# Patient Record
Sex: Male | Born: 1965
Health system: Southern US, Community
[De-identification: ages and names within clinical notes are randomized; demographics above are authoritative.]

## PROBLEM LIST (undated history)

## (undated) DIAGNOSIS — Q231 Congenital insufficiency of aortic valve: Secondary | ICD-10-CM

## (undated) DIAGNOSIS — Z794 Long term (current) use of insulin: Secondary | ICD-10-CM

## (undated) DIAGNOSIS — J449 Chronic obstructive pulmonary disease, unspecified: Secondary | ICD-10-CM

## (undated) DIAGNOSIS — I509 Heart failure, unspecified: Secondary | ICD-10-CM

## (undated) DIAGNOSIS — I499 Cardiac arrhythmia, unspecified: Secondary | ICD-10-CM

## (undated) DIAGNOSIS — G4730 Sleep apnea, unspecified: Secondary | ICD-10-CM

## (undated) DIAGNOSIS — I2510 Atherosclerotic heart disease of native coronary artery without angina pectoris: Secondary | ICD-10-CM

## (undated) HISTORY — PX: CARDIAC CATHETERIZATION: SHX172

## (undated) HISTORY — DX: Type 2 diabetes mellitus without complications: Z79.4

## (undated) HISTORY — DX: Congenital insufficiency of aortic valve: Q23.1

## (undated) HISTORY — DX: Chronic obstructive pulmonary disease, unspecified: J44.9

## (undated) HISTORY — DX: Bicuspid aortic valve: Q23.81

## (undated) HISTORY — DX: Type 2 diabetes mellitus without complications: E11.9

---

## 2007-05-31 ENCOUNTER — Ambulatory Visit: Payer: Self-pay | Admitting: Gastroenterology

## 2007-06-30 ENCOUNTER — Ambulatory Visit: Payer: Self-pay | Admitting: Gastroenterology

## 2007-07-20 ENCOUNTER — Ambulatory Visit (HOSPITAL_COMMUNITY): Admission: RE | Admit: 2007-07-20 | Discharge: 2007-07-20 | Payer: Self-pay | Admitting: Gastroenterology

## 2007-07-27 ENCOUNTER — Ambulatory Visit: Payer: Self-pay | Admitting: Gastroenterology

## 2007-08-25 ENCOUNTER — Ambulatory Visit: Payer: Self-pay | Admitting: Gastroenterology

## 2007-10-05 ENCOUNTER — Ambulatory Visit: Payer: Self-pay | Admitting: Gastroenterology

## 2007-11-03 ENCOUNTER — Ambulatory Visit: Payer: Self-pay | Admitting: Gastroenterology

## 2007-12-06 ENCOUNTER — Ambulatory Visit: Payer: Self-pay | Admitting: Gastroenterology

## 2008-01-10 ENCOUNTER — Ambulatory Visit: Payer: Self-pay | Admitting: Gastroenterology

## 2008-01-26 ENCOUNTER — Ambulatory Visit: Payer: Self-pay | Admitting: Gastroenterology

## 2008-01-31 ENCOUNTER — Ambulatory Visit (HOSPITAL_COMMUNITY): Admission: RE | Admit: 2008-01-31 | Discharge: 2008-01-31 | Payer: Self-pay | Admitting: Internal Medicine

## 2008-02-23 ENCOUNTER — Ambulatory Visit: Payer: Self-pay | Admitting: Gastroenterology

## 2008-05-24 ENCOUNTER — Ambulatory Visit: Payer: Self-pay | Admitting: Gastroenterology

## 2010-12-21 ENCOUNTER — Encounter: Payer: Self-pay | Admitting: Internal Medicine

## 2011-08-24 LAB — CREATININE, SERUM: Creatinine, Ser: 0.79

## 2013-08-03 DIAGNOSIS — B182 Chronic viral hepatitis C: Secondary | ICD-10-CM

## 2013-08-03 HISTORY — DX: Chronic viral hepatitis C: B18.2

## 2013-08-14 DIAGNOSIS — J449 Chronic obstructive pulmonary disease, unspecified: Secondary | ICD-10-CM | POA: Insufficient documentation

## 2015-07-03 DIAGNOSIS — E119 Type 2 diabetes mellitus without complications: Secondary | ICD-10-CM | POA: Insufficient documentation

## 2015-07-03 DIAGNOSIS — I10 Essential (primary) hypertension: Secondary | ICD-10-CM

## 2015-07-03 DIAGNOSIS — E785 Hyperlipidemia, unspecified: Secondary | ICD-10-CM | POA: Insufficient documentation

## 2015-07-03 DIAGNOSIS — Q231 Congenital insufficiency of aortic valve: Secondary | ICD-10-CM | POA: Insufficient documentation

## 2015-07-03 HISTORY — DX: Hyperlipidemia, unspecified: E78.5

## 2015-07-03 HISTORY — DX: Essential (primary) hypertension: I10

## 2016-06-03 DIAGNOSIS — R569 Unspecified convulsions: Secondary | ICD-10-CM | POA: Diagnosis not present

## 2016-06-03 DIAGNOSIS — R55 Syncope and collapse: Secondary | ICD-10-CM | POA: Diagnosis not present

## 2016-06-12 DIAGNOSIS — G4733 Obstructive sleep apnea (adult) (pediatric): Secondary | ICD-10-CM | POA: Diagnosis not present

## 2016-06-22 DIAGNOSIS — R404 Transient alteration of awareness: Secondary | ICD-10-CM | POA: Diagnosis not present

## 2016-06-22 DIAGNOSIS — G63 Polyneuropathy in diseases classified elsewhere: Secondary | ICD-10-CM | POA: Diagnosis not present

## 2016-06-22 DIAGNOSIS — M5136 Other intervertebral disc degeneration, lumbar region: Secondary | ICD-10-CM | POA: Diagnosis not present

## 2016-06-22 DIAGNOSIS — Z6837 Body mass index (BMI) 37.0-37.9, adult: Secondary | ICD-10-CM | POA: Diagnosis not present

## 2016-07-06 DIAGNOSIS — E785 Hyperlipidemia, unspecified: Secondary | ICD-10-CM | POA: Diagnosis not present

## 2016-07-06 DIAGNOSIS — E1149 Type 2 diabetes mellitus with other diabetic neurological complication: Secondary | ICD-10-CM | POA: Diagnosis not present

## 2016-07-06 DIAGNOSIS — E1142 Type 2 diabetes mellitus with diabetic polyneuropathy: Secondary | ICD-10-CM | POA: Diagnosis not present

## 2016-07-06 DIAGNOSIS — J449 Chronic obstructive pulmonary disease, unspecified: Secondary | ICD-10-CM | POA: Diagnosis not present

## 2016-07-06 DIAGNOSIS — M5116 Intervertebral disc disorders with radiculopathy, lumbar region: Secondary | ICD-10-CM | POA: Diagnosis not present

## 2016-08-10 DIAGNOSIS — M5136 Other intervertebral disc degeneration, lumbar region: Secondary | ICD-10-CM | POA: Diagnosis not present

## 2016-08-10 DIAGNOSIS — M5431 Sciatica, right side: Secondary | ICD-10-CM | POA: Diagnosis not present

## 2016-08-10 DIAGNOSIS — G63 Polyneuropathy in diseases classified elsewhere: Secondary | ICD-10-CM | POA: Diagnosis not present

## 2016-08-10 DIAGNOSIS — R404 Transient alteration of awareness: Secondary | ICD-10-CM | POA: Diagnosis not present

## 2016-08-12 DIAGNOSIS — M5136 Other intervertebral disc degeneration, lumbar region: Secondary | ICD-10-CM | POA: Diagnosis not present

## 2016-08-12 DIAGNOSIS — M5127 Other intervertebral disc displacement, lumbosacral region: Secondary | ICD-10-CM | POA: Diagnosis not present

## 2016-08-12 DIAGNOSIS — M5126 Other intervertebral disc displacement, lumbar region: Secondary | ICD-10-CM | POA: Diagnosis not present

## 2016-08-12 DIAGNOSIS — M4807 Spinal stenosis, lumbosacral region: Secondary | ICD-10-CM | POA: Diagnosis not present

## 2016-08-12 DIAGNOSIS — M4316 Spondylolisthesis, lumbar region: Secondary | ICD-10-CM | POA: Diagnosis not present

## 2016-08-24 DIAGNOSIS — M5136 Other intervertebral disc degeneration, lumbar region: Secondary | ICD-10-CM | POA: Diagnosis not present

## 2016-08-24 DIAGNOSIS — M5431 Sciatica, right side: Secondary | ICD-10-CM | POA: Diagnosis not present

## 2016-08-24 DIAGNOSIS — G63 Polyneuropathy in diseases classified elsewhere: Secondary | ICD-10-CM | POA: Diagnosis not present

## 2016-08-24 DIAGNOSIS — R404 Transient alteration of awareness: Secondary | ICD-10-CM | POA: Diagnosis not present

## 2016-09-11 DIAGNOSIS — G4733 Obstructive sleep apnea (adult) (pediatric): Secondary | ICD-10-CM | POA: Diagnosis not present

## 2016-09-17 DIAGNOSIS — M5416 Radiculopathy, lumbar region: Secondary | ICD-10-CM | POA: Diagnosis not present

## 2016-09-22 DIAGNOSIS — Z23 Encounter for immunization: Secondary | ICD-10-CM | POA: Diagnosis not present

## 2016-10-12 DIAGNOSIS — E1142 Type 2 diabetes mellitus with diabetic polyneuropathy: Secondary | ICD-10-CM

## 2016-10-12 DIAGNOSIS — E669 Obesity, unspecified: Secondary | ICD-10-CM

## 2016-10-12 DIAGNOSIS — Z794 Long term (current) use of insulin: Secondary | ICD-10-CM

## 2016-10-12 DIAGNOSIS — E785 Hyperlipidemia, unspecified: Secondary | ICD-10-CM

## 2016-10-12 HISTORY — DX: Obesity, unspecified: E66.9

## 2016-10-12 HISTORY — DX: Long term (current) use of insulin: Z79.4

## 2016-10-12 HISTORY — DX: Type 2 diabetes mellitus with diabetic polyneuropathy: E11.42

## 2016-10-12 HISTORY — DX: Morbid (severe) obesity due to excess calories: E66.01

## 2016-10-12 HISTORY — DX: Hyperlipidemia, unspecified: E78.5

## 2016-10-26 DIAGNOSIS — R404 Transient alteration of awareness: Secondary | ICD-10-CM | POA: Diagnosis not present

## 2016-10-26 DIAGNOSIS — G63 Polyneuropathy in diseases classified elsewhere: Secondary | ICD-10-CM | POA: Diagnosis not present

## 2016-10-26 DIAGNOSIS — M5431 Sciatica, right side: Secondary | ICD-10-CM | POA: Diagnosis not present

## 2016-10-26 DIAGNOSIS — M5136 Other intervertebral disc degeneration, lumbar region: Secondary | ICD-10-CM | POA: Diagnosis not present

## 2016-11-02 DIAGNOSIS — G4733 Obstructive sleep apnea (adult) (pediatric): Secondary | ICD-10-CM | POA: Diagnosis not present

## 2016-11-02 DIAGNOSIS — F411 Generalized anxiety disorder: Secondary | ICD-10-CM | POA: Diagnosis not present

## 2016-11-02 DIAGNOSIS — J4520 Mild intermittent asthma, uncomplicated: Secondary | ICD-10-CM | POA: Diagnosis not present

## 2016-11-03 DIAGNOSIS — L578 Other skin changes due to chronic exposure to nonionizing radiation: Secondary | ICD-10-CM | POA: Diagnosis not present

## 2016-11-03 DIAGNOSIS — L237 Allergic contact dermatitis due to plants, except food: Secondary | ICD-10-CM | POA: Diagnosis not present

## 2016-11-05 DIAGNOSIS — J449 Chronic obstructive pulmonary disease, unspecified: Secondary | ICD-10-CM | POA: Diagnosis not present

## 2016-11-05 DIAGNOSIS — E782 Mixed hyperlipidemia: Secondary | ICD-10-CM | POA: Diagnosis not present

## 2016-11-05 DIAGNOSIS — J410 Simple chronic bronchitis: Secondary | ICD-10-CM | POA: Diagnosis not present

## 2016-11-05 DIAGNOSIS — E1149 Type 2 diabetes mellitus with other diabetic neurological complication: Secondary | ICD-10-CM | POA: Diagnosis not present

## 2016-11-16 DIAGNOSIS — G4733 Obstructive sleep apnea (adult) (pediatric): Secondary | ICD-10-CM | POA: Diagnosis not present

## 2016-11-27 DIAGNOSIS — G4733 Obstructive sleep apnea (adult) (pediatric): Secondary | ICD-10-CM | POA: Diagnosis not present

## 2016-11-27 DIAGNOSIS — J4520 Mild intermittent asthma, uncomplicated: Secondary | ICD-10-CM | POA: Diagnosis not present

## 2016-11-27 DIAGNOSIS — F411 Generalized anxiety disorder: Secondary | ICD-10-CM | POA: Diagnosis not present

## 2016-12-31 DIAGNOSIS — Z6838 Body mass index (BMI) 38.0-38.9, adult: Secondary | ICD-10-CM | POA: Diagnosis not present

## 2016-12-31 DIAGNOSIS — B182 Chronic viral hepatitis C: Secondary | ICD-10-CM | POA: Diagnosis not present

## 2016-12-31 DIAGNOSIS — K7469 Other cirrhosis of liver: Secondary | ICD-10-CM | POA: Diagnosis not present

## 2017-01-08 DIAGNOSIS — K7469 Other cirrhosis of liver: Secondary | ICD-10-CM | POA: Diagnosis not present

## 2017-01-25 DIAGNOSIS — H9313 Tinnitus, bilateral: Secondary | ICD-10-CM | POA: Diagnosis not present

## 2017-01-28 DIAGNOSIS — I10 Essential (primary) hypertension: Secondary | ICD-10-CM | POA: Diagnosis not present

## 2017-01-28 DIAGNOSIS — E785 Hyperlipidemia, unspecified: Secondary | ICD-10-CM | POA: Diagnosis not present

## 2017-01-28 DIAGNOSIS — Q231 Congenital insufficiency of aortic valve: Secondary | ICD-10-CM | POA: Diagnosis not present

## 2017-01-28 DIAGNOSIS — E119 Type 2 diabetes mellitus without complications: Secondary | ICD-10-CM | POA: Diagnosis not present

## 2017-02-10 DIAGNOSIS — B1920 Unspecified viral hepatitis C without hepatic coma: Secondary | ICD-10-CM | POA: Diagnosis not present

## 2017-02-10 DIAGNOSIS — E785 Hyperlipidemia, unspecified: Secondary | ICD-10-CM | POA: Diagnosis not present

## 2017-02-10 DIAGNOSIS — Z6838 Body mass index (BMI) 38.0-38.9, adult: Secondary | ICD-10-CM | POA: Diagnosis not present

## 2017-02-10 DIAGNOSIS — F329 Major depressive disorder, single episode, unspecified: Secondary | ICD-10-CM | POA: Diagnosis not present

## 2017-02-10 DIAGNOSIS — J45909 Unspecified asthma, uncomplicated: Secondary | ICD-10-CM | POA: Diagnosis not present

## 2017-02-10 DIAGNOSIS — J4520 Mild intermittent asthma, uncomplicated: Secondary | ICD-10-CM | POA: Diagnosis not present

## 2017-02-10 DIAGNOSIS — R0600 Dyspnea, unspecified: Secondary | ICD-10-CM | POA: Diagnosis not present

## 2017-02-10 DIAGNOSIS — E119 Type 2 diabetes mellitus without complications: Secondary | ICD-10-CM | POA: Diagnosis not present

## 2017-02-10 DIAGNOSIS — K5900 Constipation, unspecified: Secondary | ICD-10-CM | POA: Diagnosis not present

## 2017-02-10 DIAGNOSIS — F411 Generalized anxiety disorder: Secondary | ICD-10-CM | POA: Diagnosis not present

## 2017-02-10 DIAGNOSIS — G4733 Obstructive sleep apnea (adult) (pediatric): Secondary | ICD-10-CM | POA: Diagnosis not present

## 2017-02-10 DIAGNOSIS — Z79899 Other long term (current) drug therapy: Secondary | ICD-10-CM | POA: Diagnosis not present

## 2017-02-10 DIAGNOSIS — Z794 Long term (current) use of insulin: Secondary | ICD-10-CM | POA: Diagnosis not present

## 2017-02-10 DIAGNOSIS — I10 Essential (primary) hypertension: Secondary | ICD-10-CM | POA: Diagnosis not present

## 2017-02-10 DIAGNOSIS — R1084 Generalized abdominal pain: Secondary | ICD-10-CM | POA: Diagnosis not present

## 2017-02-12 DIAGNOSIS — G4733 Obstructive sleep apnea (adult) (pediatric): Secondary | ICD-10-CM | POA: Diagnosis not present

## 2017-02-16 DIAGNOSIS — Q231 Congenital insufficiency of aortic valve: Secondary | ICD-10-CM | POA: Diagnosis not present

## 2017-03-08 DIAGNOSIS — J4520 Mild intermittent asthma, uncomplicated: Secondary | ICD-10-CM | POA: Diagnosis not present

## 2017-03-08 DIAGNOSIS — E782 Mixed hyperlipidemia: Secondary | ICD-10-CM | POA: Diagnosis not present

## 2017-03-08 DIAGNOSIS — E1142 Type 2 diabetes mellitus with diabetic polyneuropathy: Secondary | ICD-10-CM | POA: Diagnosis not present

## 2017-03-08 DIAGNOSIS — I10 Essential (primary) hypertension: Secondary | ICD-10-CM | POA: Diagnosis not present

## 2017-04-20 DIAGNOSIS — M5136 Other intervertebral disc degeneration, lumbar region: Secondary | ICD-10-CM | POA: Diagnosis not present

## 2017-04-20 DIAGNOSIS — R404 Transient alteration of awareness: Secondary | ICD-10-CM | POA: Diagnosis not present

## 2017-04-20 DIAGNOSIS — M5431 Sciatica, right side: Secondary | ICD-10-CM | POA: Diagnosis not present

## 2017-04-20 DIAGNOSIS — G63 Polyneuropathy in diseases classified elsewhere: Secondary | ICD-10-CM | POA: Diagnosis not present

## 2017-05-13 DIAGNOSIS — M5416 Radiculopathy, lumbar region: Secondary | ICD-10-CM | POA: Diagnosis not present

## 2017-06-07 DIAGNOSIS — Z6838 Body mass index (BMI) 38.0-38.9, adult: Secondary | ICD-10-CM | POA: Diagnosis not present

## 2017-06-07 DIAGNOSIS — M545 Low back pain: Secondary | ICD-10-CM | POA: Diagnosis not present

## 2017-07-02 DIAGNOSIS — M545 Low back pain: Secondary | ICD-10-CM | POA: Diagnosis not present

## 2017-07-08 DIAGNOSIS — E782 Mixed hyperlipidemia: Secondary | ICD-10-CM | POA: Diagnosis not present

## 2017-07-08 DIAGNOSIS — J449 Chronic obstructive pulmonary disease, unspecified: Secondary | ICD-10-CM | POA: Diagnosis not present

## 2017-07-08 DIAGNOSIS — I10 Essential (primary) hypertension: Secondary | ICD-10-CM | POA: Diagnosis not present

## 2017-07-08 DIAGNOSIS — E1142 Type 2 diabetes mellitus with diabetic polyneuropathy: Secondary | ICD-10-CM | POA: Diagnosis not present

## 2017-07-12 DIAGNOSIS — R0600 Dyspnea, unspecified: Secondary | ICD-10-CM | POA: Diagnosis not present

## 2017-07-12 DIAGNOSIS — J4520 Mild intermittent asthma, uncomplicated: Secondary | ICD-10-CM | POA: Diagnosis not present

## 2017-07-12 DIAGNOSIS — G4733 Obstructive sleep apnea (adult) (pediatric): Secondary | ICD-10-CM | POA: Diagnosis not present

## 2017-07-12 DIAGNOSIS — R5383 Other fatigue: Secondary | ICD-10-CM | POA: Diagnosis not present

## 2017-07-19 DIAGNOSIS — E1142 Type 2 diabetes mellitus with diabetic polyneuropathy: Secondary | ICD-10-CM | POA: Diagnosis not present

## 2017-08-31 DIAGNOSIS — Z23 Encounter for immunization: Secondary | ICD-10-CM | POA: Diagnosis not present

## 2018-02-01 DIAGNOSIS — M519 Unspecified thoracic, thoracolumbar and lumbosacral intervertebral disc disorder: Secondary | ICD-10-CM | POA: Diagnosis not present

## 2018-02-01 DIAGNOSIS — I10 Essential (primary) hypertension: Secondary | ICD-10-CM | POA: Diagnosis not present

## 2018-02-01 DIAGNOSIS — E1142 Type 2 diabetes mellitus with diabetic polyneuropathy: Secondary | ICD-10-CM | POA: Diagnosis not present

## 2018-02-01 DIAGNOSIS — J449 Chronic obstructive pulmonary disease, unspecified: Secondary | ICD-10-CM | POA: Diagnosis not present

## 2018-02-01 DIAGNOSIS — E782 Mixed hyperlipidemia: Secondary | ICD-10-CM | POA: Diagnosis not present

## 2018-03-01 DIAGNOSIS — Z6839 Body mass index (BMI) 39.0-39.9, adult: Secondary | ICD-10-CM | POA: Diagnosis not present

## 2018-03-01 DIAGNOSIS — Q251 Coarctation of aorta: Secondary | ICD-10-CM | POA: Diagnosis not present

## 2018-03-01 DIAGNOSIS — F329 Major depressive disorder, single episode, unspecified: Secondary | ICD-10-CM | POA: Diagnosis not present

## 2018-03-01 DIAGNOSIS — Z09 Encounter for follow-up examination after completed treatment for conditions other than malignant neoplasm: Secondary | ICD-10-CM | POA: Diagnosis not present

## 2018-03-01 DIAGNOSIS — J449 Chronic obstructive pulmonary disease, unspecified: Secondary | ICD-10-CM | POA: Diagnosis not present

## 2018-03-01 DIAGNOSIS — E785 Hyperlipidemia, unspecified: Secondary | ICD-10-CM | POA: Diagnosis not present

## 2018-03-01 DIAGNOSIS — R450 Nervousness: Secondary | ICD-10-CM | POA: Diagnosis not present

## 2018-03-01 DIAGNOSIS — I10 Essential (primary) hypertension: Secondary | ICD-10-CM | POA: Diagnosis not present

## 2018-03-01 DIAGNOSIS — E669 Obesity, unspecified: Secondary | ICD-10-CM | POA: Diagnosis not present

## 2018-03-01 DIAGNOSIS — Z79899 Other long term (current) drug therapy: Secondary | ICD-10-CM | POA: Diagnosis not present

## 2018-03-01 DIAGNOSIS — Q231 Congenital insufficiency of aortic valve: Secondary | ICD-10-CM | POA: Diagnosis not present

## 2018-03-01 DIAGNOSIS — K760 Fatty (change of) liver, not elsewhere classified: Secondary | ICD-10-CM | POA: Diagnosis not present

## 2018-03-01 DIAGNOSIS — R000 Tachycardia, unspecified: Secondary | ICD-10-CM | POA: Diagnosis not present

## 2018-03-01 DIAGNOSIS — E119 Type 2 diabetes mellitus without complications: Secondary | ICD-10-CM | POA: Diagnosis not present

## 2018-03-01 DIAGNOSIS — I349 Nonrheumatic mitral valve disorder, unspecified: Secondary | ICD-10-CM | POA: Diagnosis not present

## 2018-03-01 DIAGNOSIS — E663 Overweight: Secondary | ICD-10-CM | POA: Diagnosis not present

## 2018-03-01 DIAGNOSIS — R011 Cardiac murmur, unspecified: Secondary | ICD-10-CM | POA: Diagnosis not present

## 2018-03-18 DIAGNOSIS — I10 Essential (primary) hypertension: Secondary | ICD-10-CM | POA: Diagnosis not present

## 2018-03-18 DIAGNOSIS — E785 Hyperlipidemia, unspecified: Secondary | ICD-10-CM | POA: Diagnosis not present

## 2018-03-18 DIAGNOSIS — K7469 Other cirrhosis of liver: Secondary | ICD-10-CM | POA: Diagnosis not present

## 2018-03-18 DIAGNOSIS — J45909 Unspecified asthma, uncomplicated: Secondary | ICD-10-CM | POA: Diagnosis not present

## 2018-03-18 DIAGNOSIS — Z6839 Body mass index (BMI) 39.0-39.9, adult: Secondary | ICD-10-CM | POA: Diagnosis not present

## 2018-03-18 DIAGNOSIS — B1920 Unspecified viral hepatitis C without hepatic coma: Secondary | ICD-10-CM | POA: Diagnosis not present

## 2018-03-18 DIAGNOSIS — E119 Type 2 diabetes mellitus without complications: Secondary | ICD-10-CM | POA: Diagnosis not present

## 2018-04-12 DIAGNOSIS — K7460 Unspecified cirrhosis of liver: Secondary | ICD-10-CM | POA: Diagnosis not present

## 2018-04-12 DIAGNOSIS — K7469 Other cirrhosis of liver: Secondary | ICD-10-CM | POA: Diagnosis not present

## 2018-04-19 DIAGNOSIS — J441 Chronic obstructive pulmonary disease with (acute) exacerbation: Secondary | ICD-10-CM | POA: Diagnosis not present

## 2018-05-16 DIAGNOSIS — E119 Type 2 diabetes mellitus without complications: Secondary | ICD-10-CM | POA: Diagnosis not present

## 2018-05-16 DIAGNOSIS — I739 Peripheral vascular disease, unspecified: Secondary | ICD-10-CM | POA: Diagnosis not present

## 2018-05-16 DIAGNOSIS — I10 Essential (primary) hypertension: Secondary | ICD-10-CM | POA: Diagnosis not present

## 2018-05-16 DIAGNOSIS — Z794 Long term (current) use of insulin: Secondary | ICD-10-CM | POA: Diagnosis not present

## 2018-05-16 DIAGNOSIS — Z87891 Personal history of nicotine dependence: Secondary | ICD-10-CM | POA: Diagnosis not present

## 2018-05-16 DIAGNOSIS — E785 Hyperlipidemia, unspecified: Secondary | ICD-10-CM | POA: Diagnosis not present

## 2018-06-06 DIAGNOSIS — E1142 Type 2 diabetes mellitus with diabetic polyneuropathy: Secondary | ICD-10-CM | POA: Diagnosis not present

## 2018-06-06 DIAGNOSIS — M519 Unspecified thoracic, thoracolumbar and lumbosacral intervertebral disc disorder: Secondary | ICD-10-CM | POA: Diagnosis not present

## 2018-06-06 DIAGNOSIS — J449 Chronic obstructive pulmonary disease, unspecified: Secondary | ICD-10-CM | POA: Diagnosis not present

## 2018-06-06 DIAGNOSIS — I10 Essential (primary) hypertension: Secondary | ICD-10-CM | POA: Diagnosis not present

## 2018-06-06 DIAGNOSIS — E782 Mixed hyperlipidemia: Secondary | ICD-10-CM | POA: Diagnosis not present

## 2018-06-14 DIAGNOSIS — Z7984 Long term (current) use of oral hypoglycemic drugs: Secondary | ICD-10-CM | POA: Diagnosis not present

## 2018-06-14 DIAGNOSIS — E119 Type 2 diabetes mellitus without complications: Secondary | ICD-10-CM | POA: Diagnosis not present

## 2018-06-14 DIAGNOSIS — E669 Obesity, unspecified: Secondary | ICD-10-CM | POA: Diagnosis not present

## 2018-06-14 DIAGNOSIS — I10 Essential (primary) hypertension: Secondary | ICD-10-CM | POA: Diagnosis not present

## 2018-06-28 DIAGNOSIS — Z7984 Long term (current) use of oral hypoglycemic drugs: Secondary | ICD-10-CM | POA: Diagnosis not present

## 2018-06-28 DIAGNOSIS — E669 Obesity, unspecified: Secondary | ICD-10-CM | POA: Diagnosis not present

## 2018-06-28 DIAGNOSIS — E119 Type 2 diabetes mellitus without complications: Secondary | ICD-10-CM | POA: Diagnosis not present

## 2018-06-28 DIAGNOSIS — I10 Essential (primary) hypertension: Secondary | ICD-10-CM | POA: Diagnosis not present

## 2018-07-13 DIAGNOSIS — Z7984 Long term (current) use of oral hypoglycemic drugs: Secondary | ICD-10-CM | POA: Diagnosis not present

## 2018-07-13 DIAGNOSIS — I10 Essential (primary) hypertension: Secondary | ICD-10-CM | POA: Diagnosis not present

## 2018-07-13 DIAGNOSIS — E119 Type 2 diabetes mellitus without complications: Secondary | ICD-10-CM | POA: Diagnosis not present

## 2018-07-13 DIAGNOSIS — E669 Obesity, unspecified: Secondary | ICD-10-CM | POA: Diagnosis not present

## 2018-08-10 DIAGNOSIS — E119 Type 2 diabetes mellitus without complications: Secondary | ICD-10-CM | POA: Diagnosis not present

## 2018-08-10 DIAGNOSIS — Z7984 Long term (current) use of oral hypoglycemic drugs: Secondary | ICD-10-CM | POA: Diagnosis not present

## 2018-08-10 DIAGNOSIS — I10 Essential (primary) hypertension: Secondary | ICD-10-CM | POA: Diagnosis not present

## 2018-08-10 DIAGNOSIS — E669 Obesity, unspecified: Secondary | ICD-10-CM | POA: Diagnosis not present

## 2018-08-22 DIAGNOSIS — M549 Dorsalgia, unspecified: Secondary | ICD-10-CM | POA: Diagnosis not present

## 2018-08-22 DIAGNOSIS — Z6837 Body mass index (BMI) 37.0-37.9, adult: Secondary | ICD-10-CM | POA: Diagnosis not present

## 2018-08-23 DIAGNOSIS — M5416 Radiculopathy, lumbar region: Secondary | ICD-10-CM | POA: Diagnosis not present

## 2018-09-01 DIAGNOSIS — Z23 Encounter for immunization: Secondary | ICD-10-CM | POA: Diagnosis not present

## 2018-09-02 DIAGNOSIS — M5416 Radiculopathy, lumbar region: Secondary | ICD-10-CM | POA: Diagnosis not present

## 2018-09-09 DIAGNOSIS — M5416 Radiculopathy, lumbar region: Secondary | ICD-10-CM | POA: Diagnosis not present

## 2018-09-15 DIAGNOSIS — M5416 Radiculopathy, lumbar region: Secondary | ICD-10-CM | POA: Diagnosis not present

## 2018-09-19 DIAGNOSIS — R162 Hepatomegaly with splenomegaly, not elsewhere classified: Secondary | ICD-10-CM | POA: Diagnosis not present

## 2018-09-19 DIAGNOSIS — Z6838 Body mass index (BMI) 38.0-38.9, adult: Secondary | ICD-10-CM | POA: Diagnosis not present

## 2018-09-19 DIAGNOSIS — E785 Hyperlipidemia, unspecified: Secondary | ICD-10-CM | POA: Diagnosis not present

## 2018-09-19 DIAGNOSIS — Z7984 Long term (current) use of oral hypoglycemic drugs: Secondary | ICD-10-CM | POA: Diagnosis not present

## 2018-09-19 DIAGNOSIS — E669 Obesity, unspecified: Secondary | ICD-10-CM | POA: Diagnosis not present

## 2018-09-19 DIAGNOSIS — K7469 Other cirrhosis of liver: Secondary | ICD-10-CM | POA: Diagnosis not present

## 2018-09-19 DIAGNOSIS — K7689 Other specified diseases of liver: Secondary | ICD-10-CM | POA: Diagnosis not present

## 2018-09-19 DIAGNOSIS — E119 Type 2 diabetes mellitus without complications: Secondary | ICD-10-CM | POA: Diagnosis not present

## 2018-09-19 DIAGNOSIS — Z79899 Other long term (current) drug therapy: Secondary | ICD-10-CM | POA: Diagnosis not present

## 2018-09-19 DIAGNOSIS — R932 Abnormal findings on diagnostic imaging of liver and biliary tract: Secondary | ICD-10-CM | POA: Diagnosis not present

## 2018-09-19 DIAGNOSIS — I10 Essential (primary) hypertension: Secondary | ICD-10-CM | POA: Diagnosis not present

## 2018-09-30 DIAGNOSIS — M5416 Radiculopathy, lumbar region: Secondary | ICD-10-CM | POA: Diagnosis not present

## 2018-10-13 DIAGNOSIS — E782 Mixed hyperlipidemia: Secondary | ICD-10-CM | POA: Diagnosis not present

## 2018-10-13 DIAGNOSIS — I10 Essential (primary) hypertension: Secondary | ICD-10-CM | POA: Diagnosis not present

## 2018-10-13 DIAGNOSIS — E1142 Type 2 diabetes mellitus with diabetic polyneuropathy: Secondary | ICD-10-CM | POA: Diagnosis not present

## 2018-10-19 DIAGNOSIS — E119 Type 2 diabetes mellitus without complications: Secondary | ICD-10-CM | POA: Diagnosis not present

## 2018-10-19 DIAGNOSIS — E669 Obesity, unspecified: Secondary | ICD-10-CM | POA: Diagnosis not present

## 2018-10-19 DIAGNOSIS — I10 Essential (primary) hypertension: Secondary | ICD-10-CM | POA: Diagnosis not present

## 2018-10-19 DIAGNOSIS — Z7984 Long term (current) use of oral hypoglycemic drugs: Secondary | ICD-10-CM | POA: Diagnosis not present

## 2019-01-04 DIAGNOSIS — E785 Hyperlipidemia, unspecified: Secondary | ICD-10-CM | POA: Diagnosis not present

## 2019-01-04 DIAGNOSIS — Z87891 Personal history of nicotine dependence: Secondary | ICD-10-CM | POA: Diagnosis not present

## 2019-01-04 DIAGNOSIS — J441 Chronic obstructive pulmonary disease with (acute) exacerbation: Secondary | ICD-10-CM | POA: Diagnosis not present

## 2019-01-04 DIAGNOSIS — F329 Major depressive disorder, single episode, unspecified: Secondary | ICD-10-CM | POA: Diagnosis not present

## 2019-01-04 DIAGNOSIS — Z6836 Body mass index (BMI) 36.0-36.9, adult: Secondary | ICD-10-CM | POA: Diagnosis not present

## 2019-01-04 DIAGNOSIS — E119 Type 2 diabetes mellitus without complications: Secondary | ICD-10-CM | POA: Diagnosis not present

## 2019-01-04 DIAGNOSIS — J449 Chronic obstructive pulmonary disease, unspecified: Secondary | ICD-10-CM | POA: Diagnosis not present

## 2019-01-04 DIAGNOSIS — I10 Essential (primary) hypertension: Secondary | ICD-10-CM | POA: Diagnosis not present

## 2019-01-04 DIAGNOSIS — Z79899 Other long term (current) drug therapy: Secondary | ICD-10-CM | POA: Diagnosis not present

## 2019-01-04 DIAGNOSIS — J45909 Unspecified asthma, uncomplicated: Secondary | ICD-10-CM | POA: Diagnosis not present

## 2019-02-14 DIAGNOSIS — E785 Hyperlipidemia, unspecified: Secondary | ICD-10-CM | POA: Diagnosis not present

## 2019-02-14 DIAGNOSIS — I10 Essential (primary) hypertension: Secondary | ICD-10-CM | POA: Diagnosis not present

## 2019-02-14 DIAGNOSIS — J449 Chronic obstructive pulmonary disease, unspecified: Secondary | ICD-10-CM | POA: Diagnosis not present

## 2019-02-14 DIAGNOSIS — M519 Unspecified thoracic, thoracolumbar and lumbosacral intervertebral disc disorder: Secondary | ICD-10-CM | POA: Diagnosis not present

## 2019-02-14 DIAGNOSIS — E782 Mixed hyperlipidemia: Secondary | ICD-10-CM | POA: Diagnosis not present

## 2019-02-14 DIAGNOSIS — E1142 Type 2 diabetes mellitus with diabetic polyneuropathy: Secondary | ICD-10-CM | POA: Diagnosis not present

## 2019-02-15 DIAGNOSIS — Q231 Congenital insufficiency of aortic valve: Secondary | ICD-10-CM | POA: Diagnosis not present

## 2019-02-17 DIAGNOSIS — E785 Hyperlipidemia, unspecified: Secondary | ICD-10-CM | POA: Diagnosis not present

## 2019-02-17 DIAGNOSIS — I10 Essential (primary) hypertension: Secondary | ICD-10-CM | POA: Diagnosis not present

## 2019-02-17 DIAGNOSIS — Q231 Congenital insufficiency of aortic valve: Secondary | ICD-10-CM | POA: Diagnosis not present

## 2019-03-21 DIAGNOSIS — K7469 Other cirrhosis of liver: Secondary | ICD-10-CM | POA: Diagnosis not present

## 2019-03-27 DIAGNOSIS — M542 Cervicalgia: Secondary | ICD-10-CM | POA: Diagnosis not present

## 2019-06-27 DIAGNOSIS — G4733 Obstructive sleep apnea (adult) (pediatric): Secondary | ICD-10-CM | POA: Diagnosis not present

## 2019-06-27 DIAGNOSIS — E1142 Type 2 diabetes mellitus with diabetic polyneuropathy: Secondary | ICD-10-CM | POA: Diagnosis not present

## 2019-06-27 DIAGNOSIS — I10 Essential (primary) hypertension: Secondary | ICD-10-CM | POA: Diagnosis not present

## 2019-06-27 DIAGNOSIS — E782 Mixed hyperlipidemia: Secondary | ICD-10-CM | POA: Diagnosis not present

## 2019-06-27 DIAGNOSIS — J449 Chronic obstructive pulmonary disease, unspecified: Secondary | ICD-10-CM | POA: Diagnosis not present

## 2019-08-01 HISTORY — PX: OTHER SURGICAL HISTORY: SHX169

## 2019-08-14 DIAGNOSIS — R5383 Other fatigue: Secondary | ICD-10-CM | POA: Diagnosis not present

## 2019-08-14 DIAGNOSIS — Z6838 Body mass index (BMI) 38.0-38.9, adult: Secondary | ICD-10-CM | POA: Diagnosis not present

## 2019-08-14 DIAGNOSIS — I352 Nonrheumatic aortic (valve) stenosis with insufficiency: Secondary | ICD-10-CM | POA: Diagnosis not present

## 2019-08-14 DIAGNOSIS — R000 Tachycardia, unspecified: Secondary | ICD-10-CM | POA: Diagnosis not present

## 2019-08-14 DIAGNOSIS — Q231 Congenital insufficiency of aortic valve: Secondary | ICD-10-CM | POA: Diagnosis not present

## 2019-08-18 DIAGNOSIS — R5383 Other fatigue: Secondary | ICD-10-CM | POA: Diagnosis not present

## 2019-08-18 DIAGNOSIS — R0602 Shortness of breath: Secondary | ICD-10-CM | POA: Diagnosis not present

## 2019-09-27 DIAGNOSIS — Z23 Encounter for immunization: Secondary | ICD-10-CM | POA: Diagnosis not present

## 2019-10-11 DIAGNOSIS — Z1159 Encounter for screening for other viral diseases: Secondary | ICD-10-CM | POA: Diagnosis not present

## 2019-10-11 DIAGNOSIS — L255 Unspecified contact dermatitis due to plants, except food: Secondary | ICD-10-CM | POA: Diagnosis not present

## 2019-10-17 DIAGNOSIS — I209 Angina pectoris, unspecified: Secondary | ICD-10-CM | POA: Diagnosis not present

## 2019-10-23 DIAGNOSIS — I209 Angina pectoris, unspecified: Secondary | ICD-10-CM | POA: Diagnosis not present

## 2019-10-24 DIAGNOSIS — I209 Angina pectoris, unspecified: Secondary | ICD-10-CM | POA: Diagnosis not present

## 2019-10-25 DIAGNOSIS — E1142 Type 2 diabetes mellitus with diabetic polyneuropathy: Secondary | ICD-10-CM | POA: Diagnosis not present

## 2019-10-25 DIAGNOSIS — I10 Essential (primary) hypertension: Secondary | ICD-10-CM | POA: Diagnosis not present

## 2019-10-25 DIAGNOSIS — J4520 Mild intermittent asthma, uncomplicated: Secondary | ICD-10-CM | POA: Diagnosis not present

## 2019-10-25 DIAGNOSIS — E782 Mixed hyperlipidemia: Secondary | ICD-10-CM | POA: Diagnosis not present

## 2019-10-30 DIAGNOSIS — I209 Angina pectoris, unspecified: Secondary | ICD-10-CM | POA: Diagnosis not present

## 2019-11-02 DIAGNOSIS — I209 Angina pectoris, unspecified: Secondary | ICD-10-CM | POA: Diagnosis not present

## 2019-11-06 DIAGNOSIS — I209 Angina pectoris, unspecified: Secondary | ICD-10-CM | POA: Diagnosis not present

## 2019-11-08 DIAGNOSIS — I209 Angina pectoris, unspecified: Secondary | ICD-10-CM | POA: Diagnosis not present

## 2019-11-09 DIAGNOSIS — I209 Angina pectoris, unspecified: Secondary | ICD-10-CM | POA: Diagnosis not present

## 2019-11-13 DIAGNOSIS — Q231 Congenital insufficiency of aortic valve: Secondary | ICD-10-CM | POA: Diagnosis not present

## 2019-11-13 DIAGNOSIS — Z6832 Body mass index (BMI) 32.0-32.9, adult: Secondary | ICD-10-CM | POA: Diagnosis not present

## 2019-11-13 DIAGNOSIS — I209 Angina pectoris, unspecified: Secondary | ICD-10-CM | POA: Diagnosis not present

## 2019-11-15 DIAGNOSIS — I209 Angina pectoris, unspecified: Secondary | ICD-10-CM | POA: Diagnosis not present

## 2019-11-16 DIAGNOSIS — I209 Angina pectoris, unspecified: Secondary | ICD-10-CM | POA: Diagnosis not present

## 2019-11-21 DIAGNOSIS — I209 Angina pectoris, unspecified: Secondary | ICD-10-CM | POA: Diagnosis not present

## 2019-11-22 DIAGNOSIS — I209 Angina pectoris, unspecified: Secondary | ICD-10-CM | POA: Diagnosis not present

## 2019-11-27 DIAGNOSIS — I209 Angina pectoris, unspecified: Secondary | ICD-10-CM | POA: Diagnosis not present

## 2019-11-29 DIAGNOSIS — I209 Angina pectoris, unspecified: Secondary | ICD-10-CM | POA: Diagnosis not present

## 2019-11-30 DIAGNOSIS — I209 Angina pectoris, unspecified: Secondary | ICD-10-CM | POA: Diagnosis not present

## 2019-12-04 DIAGNOSIS — I209 Angina pectoris, unspecified: Secondary | ICD-10-CM | POA: Diagnosis not present

## 2019-12-06 DIAGNOSIS — I209 Angina pectoris, unspecified: Secondary | ICD-10-CM | POA: Diagnosis not present

## 2020-01-01 ENCOUNTER — Other Ambulatory Visit: Payer: Self-pay

## 2020-01-01 DIAGNOSIS — E1142 Type 2 diabetes mellitus with diabetic polyneuropathy: Secondary | ICD-10-CM

## 2020-01-01 MED ORDER — TRESIBA FLEXTOUCH 100 UNIT/ML ~~LOC~~ SOPN: 60.0000 [IU] | PEN_INJECTOR | Freq: Every day | SUBCUTANEOUS | 2 refills | Status: DC

## 2020-01-02 ENCOUNTER — Other Ambulatory Visit: Payer: Self-pay | Admitting: Legal Medicine

## 2020-01-03 ENCOUNTER — Other Ambulatory Visit: Payer: Self-pay | Admitting: Legal Medicine

## 2020-01-08 DIAGNOSIS — R002 Palpitations: Secondary | ICD-10-CM | POA: Diagnosis not present

## 2020-01-08 DIAGNOSIS — G4730 Sleep apnea, unspecified: Secondary | ICD-10-CM | POA: Diagnosis not present

## 2020-01-08 DIAGNOSIS — Z6837 Body mass index (BMI) 37.0-37.9, adult: Secondary | ICD-10-CM | POA: Diagnosis not present

## 2020-01-15 DIAGNOSIS — R002 Palpitations: Secondary | ICD-10-CM | POA: Diagnosis not present

## 2020-01-22 ENCOUNTER — Other Ambulatory Visit: Payer: Self-pay | Admitting: Legal Medicine

## 2020-01-29 DIAGNOSIS — R002 Palpitations: Secondary | ICD-10-CM | POA: Diagnosis not present

## 2020-02-06 ENCOUNTER — Encounter: Payer: Self-pay | Admitting: Legal Medicine

## 2020-02-06 ENCOUNTER — Other Ambulatory Visit: Payer: Self-pay

## 2020-02-06 DIAGNOSIS — Z20828 Contact with and (suspected) exposure to other viral communicable diseases: Secondary | ICD-10-CM

## 2020-02-07 ENCOUNTER — Other Ambulatory Visit: Payer: Self-pay

## 2020-02-07 ENCOUNTER — Ambulatory Visit: Payer: Medicaid Other

## 2020-02-07 DIAGNOSIS — Z20828 Contact with and (suspected) exposure to other viral communicable diseases: Secondary | ICD-10-CM | POA: Diagnosis not present

## 2020-02-07 NOTE — Progress Notes (Signed)
Patient came to be tested at the car.  He states that he is not having any symptoms at this time.

## 2020-02-08 LAB — NOVEL CORONAVIRUS, NAA: SARS-CoV-2, NAA: DETECTED — AB

## 2020-02-09 ENCOUNTER — Other Ambulatory Visit: Payer: Self-pay | Admitting: Physician Assistant

## 2020-02-09 ENCOUNTER — Encounter: Payer: Self-pay | Admitting: Physician Assistant

## 2020-02-09 ENCOUNTER — Telehealth: Payer: Self-pay | Admitting: Physician Assistant

## 2020-02-09 ENCOUNTER — Ambulatory Visit (HOSPITAL_COMMUNITY)
Admission: RE | Admit: 2020-02-09 | Discharge: 2020-02-09 | Disposition: A | Discharge status: Home / Self Care | Payer: BC Managed Care – PPO | Source: Ambulatory Visit | Attending: Pulmonary Disease | Admitting: Pulmonary Disease

## 2020-02-09 DIAGNOSIS — E1142 Type 2 diabetes mellitus with diabetic polyneuropathy: Secondary | ICD-10-CM

## 2020-02-09 DIAGNOSIS — Q231 Congenital insufficiency of aortic valve: Secondary | ICD-10-CM | POA: Diagnosis not present

## 2020-02-09 DIAGNOSIS — Z794 Long term (current) use of insulin: Secondary | ICD-10-CM | POA: Diagnosis not present

## 2020-02-09 DIAGNOSIS — J449 Chronic obstructive pulmonary disease, unspecified: Secondary | ICD-10-CM

## 2020-02-09 DIAGNOSIS — E119 Type 2 diabetes mellitus without complications: Secondary | ICD-10-CM | POA: Insufficient documentation

## 2020-02-09 DIAGNOSIS — I10 Essential (primary) hypertension: Secondary | ICD-10-CM

## 2020-02-09 DIAGNOSIS — U071 COVID-19: Secondary | ICD-10-CM | POA: Insufficient documentation

## 2020-02-09 MED ORDER — DIPHENHYDRAMINE HCL 50 MG/ML IJ SOLN: 50.0000 mg | Freq: Once | INTRAMUSCULAR | Status: DC | PRN

## 2020-02-09 MED ORDER — SODIUM CHLORIDE 0.9 % IV SOLN: INTRAVENOUS | Status: DC | PRN

## 2020-02-09 MED ORDER — EPINEPHRINE 0.3 MG/0.3ML IJ SOAJ: 0.3000 mg | Freq: Once | INTRAMUSCULAR | Status: DC | PRN

## 2020-02-09 MED ORDER — FAMOTIDINE IN NACL 20-0.9 MG/50ML-% IV SOLN: 20.0000 mg | Freq: Once | INTRAVENOUS | Status: DC | PRN

## 2020-02-09 MED ORDER — ALBUTEROL SULFATE HFA 108 (90 BASE) MCG/ACT IN AERS: 2.0000 | INHALATION_SPRAY | Freq: Once | RESPIRATORY_TRACT | Status: DC | PRN

## 2020-02-09 MED ORDER — METHYLPREDNISOLONE SODIUM SUCC 125 MG IJ SOLR: 125.0000 mg | Freq: Once | INTRAMUSCULAR | Status: DC | PRN

## 2020-02-09 MED ORDER — SODIUM CHLORIDE 0.9 % IV SOLN
700.0000 mg | Freq: Once | INTRAVENOUS | Status: AC
  Administered 2020-02-09: 14:00:00 700 mg via INTRAVENOUS
  Filled 2020-02-09: qty 700

## 2020-02-09 NOTE — Discharge Instructions (Signed)

## 2020-02-09 NOTE — Telephone Encounter (Addendum)
  I connected by phone with Cletus Gash on 02/09/2020 at 10:49 AM to discuss the potential use of an new treatment for mild to moderate COVID-19 viral infection in non-hospitalized patients.  This patient is a 54 y.o. male that meets the FDA criteria for Emergency Use Authorization of bamlanivimab or casirivimab\imdevimab.  Has a (+) direct SARS-CoV-2 viral test result  Has mild or moderate COVID-19   Is ? 55 years of age and weighs ? 40 kg  Is NOT hospitalized due to COVID-19  Is NOT requiring oxygen therapy or requiring an increase in baseline oxygen flow rate due to COVID-19  Is within 10 days of symptom onset  Has at least one of the high risk factor(s) for progression to severe COVID-19 and/or hospitalization as defined in EUA.  Specific high risk criteria : Diabetes, HTN, COPD   I have spoken and communicated the following to the patient or parent/caregiver:  1. FDA has authorized the emergency use of bamlanivimab and casirivimab\imdevimab for the treatment of mild to moderate COVID-19 in adults and pediatric patients with positive results of direct SARS-CoV-2 viral testing who are 36 years of age and older weighing at least 40 kg, and who are at high risk for progressing to severe COVID-19 and/or hospitalization.  2. The significant known and potential risks and benefits of bamlanivimab and casirivimab\imdevimab, and the extent to which such potential risks and benefits are unknown.  3. Information on available alternative treatments and the risks and benefits of those alternatives, including clinical trials.  4. Patients treated with bamlanivimab and casirivimab\imdevimab should continue to self-isolate and use infection control measures (e.g., wear mask, isolate, social distance, avoid sharing personal items, clean and disinfect "high touch" surfaces, and frequent handwashing) according to CDC guidelines.   5. The patient or parent/caregiver has the option to accept or  refuse bamlanivimab or casirivimab\imdevimab .  After reviewing this information with the patient, The patient agreed to proceed with receiving the bamlanimivab infusion and will be provided a copy of the Fact sheet prior to receiving the infusion..  Sx onset (nasal congestion) started 2 days ago on 02/07/20. Orders in directions given. Infusion set up for today 12:30  Cline Crock PA-C 02/09/2020 10:49 AM

## 2020-02-09 NOTE — Progress Notes (Signed)
Positive covid, if very sick, consider referral to [facility] for antibodies- need to isolate lp

## 2020-02-09 NOTE — Progress Notes (Signed)
  Diagnosis: COVID-19  Physician:wright  Procedure: [facility] Med: bamlanivimab infusion - Provided patient with bamlanimivab fact sheet for patients, parents and caregivers prior to infusion.  Complications: No immediate complications noted.  Discharge: Discharged home   Shaune Spittle 02/09/2020

## 2020-02-23 ENCOUNTER — Ambulatory Visit: Payer: BC Managed Care – PPO | Admitting: Legal Medicine

## 2020-02-23 ENCOUNTER — Other Ambulatory Visit: Payer: Self-pay

## 2020-02-23 ENCOUNTER — Encounter: Payer: Self-pay | Admitting: Legal Medicine

## 2020-02-23 VITALS — BP 132/80 | HR 88 | Temp 97.7°F | Resp 16 | Ht 64.0 in | Wt 229.0 lb

## 2020-02-23 DIAGNOSIS — M159 Polyosteoarthritis, unspecified: Secondary | ICD-10-CM

## 2020-02-23 DIAGNOSIS — I10 Essential (primary) hypertension: Secondary | ICD-10-CM

## 2020-02-23 DIAGNOSIS — E1142 Type 2 diabetes mellitus with diabetic polyneuropathy: Secondary | ICD-10-CM

## 2020-02-23 DIAGNOSIS — E782 Mixed hyperlipidemia: Secondary | ICD-10-CM

## 2020-02-23 DIAGNOSIS — J449 Chronic obstructive pulmonary disease, unspecified: Secondary | ICD-10-CM | POA: Diagnosis not present

## 2020-02-23 DIAGNOSIS — M8949 Other hypertrophic osteoarthropathy, multiple sites: Secondary | ICD-10-CM

## 2020-02-23 NOTE — Progress Notes (Signed)
Established Patient Office Visit  Subjective:  Patient ID: Marcus Hicks, male    DOB: 01/31/1966  Age: 54 y.o. MRN: [id number]  CC:  Chief Complaint  Patient presents with  . Diabetes  . Hyperlipidemia  . Hypertension    HPI Marcus Hicks presents for Chronic visit.  Patient present with type 2 diabetes.  Specifically, this is type 2, insulin dependent requiring diabetes, complicated by polyneuropathy.  Compliance with treatment has been good; patient take medicines as directed, maintains diet and exercise regimen, follows up as directed, and is keeping glucose diary.  Date of  diagnosis 2010.  Depression screen has been performed.Tobacco screen nonsmoker. Current medicines for diabetes metformin, tresiba .  Patient is on lsartan for renal protection and atorvastatin, omega 3 for cholesterol control.  Patient performs foot exams daily and last ophthalmologic exam was  2 years.  Patient presents for follow up of hypertension.  Patient tolerating losartan well with side effects.  Patient was diagnosed with hypertension 2010 so has been treated for hypertension for 10 years.Patient is working on maintaining diet and exercise regimen and follows up as directed. Complication include none.  Patient presents with hyperlipidemia.  Compliance with treatment has been good; patient takes medicines as directed, maintains low cholesterol diet, follows up as directed, and maintains exercise regimen.  Patient is using atorvastatin without problems.  Past Medical History:  Diagnosis Date  . Bicuspid aortic valve   . COPD (chronic obstructive pulmonary disease) (HCC)   . Diabetes mellitus type 2, insulin dependent (HCC)     History reviewed. No pertinent surgical history.  Family History  Problem Relation Age of Onset  . Cancer Maternal Grandfather     Social History   Socioeconomic History  . Marital status: Married    Spouse name: Not on file  . Number of children: Not on  file  . Years of education: Not on file  . Highest education level: Not on file  Occupational History  . Not on file  Tobacco Use  . Smoking status: Former Smoker    Quit date: 2007    Years since quitting: 14.2  . Smokeless tobacco: Former Neurosurgeon    Quit date: 2015  Substance and Sexual Activity  . Alcohol use: Not Currently  . Drug use: Never  . Sexual activity: Not on file  Other Topics Concern  . Not on file  Social History Narrative  . Not on file   Social Determinants of Health   Financial Resource Strain:   . Difficulty of Paying Living Expenses:   Food Insecurity:   . Worried About Programme researcher, broadcasting/film/video in the Last Year:   . Barista in the Last Year:   Transportation Needs:   . Freight forwarder (Medical):   Marland Kitchen Lack of Transportation (Non-Medical):   Physical Activity:   . Days of Exercise per Week:   . Minutes of Exercise per Session:   Stress:   . Feeling of Stress :   Social Connections:   . Frequency of Communication with Friends and Family:   . Frequency of Social Gatherings with Friends and Family:   . Attends Religious Services:   . Active Member of Clubs or Organizations:   . Attends Banker Meetings:   Marland Kitchen Marital Status:   Intimate Partner Violence:   . Fear of Current or Ex-Partner:   . Emotionally Abused:   Marland Kitchen Physically Abused:   . Sexually Abused:  Outpatient Medications Prior to Visit  Medication Sig Dispense Refill  . albuterol (VENTOLIN HFA) 108 (90 Base) MCG/ACT inhaler Inhale into the lungs every 6 (six) hours as needed for wheezing or shortness of breath.    . ALPRAZolam (XANAX) 0.5 MG tablet Take 0.5 mg by mouth 3 (three) times daily as needed.    . BD PEN NEEDLE NANO 2ND GEN 32G X 4 MM MISC daily. use as directed    . Gabapentin, Once-Daily, (GRALISE) 600 MG TABS Take by mouth.    Marland Kitchen glucose blood (CONTOUR NEXT TEST) test strip Use to check blood sugar twice daily. Dx: E11.42    . losartan (COZAAR) 100 MG tablet  Take by mouth.    . meloxicam (MOBIC) 15 MG tablet Take 15 mg by mouth daily.    . metFORMIN (GLUCOPHAGE) 500 MG tablet Take 500 mg by mouth 2 (two) times daily.    . metoprolol succinate (TOPROL-XL) 50 MG 24 hr tablet Take 50 mg by mouth daily.    . Microlet Lancets MISC USE TO CHECK BLOOD SUGAR EVERY MORNING AND AFTER MEALS    . nitroGLYCERIN (NITROSTAT) 0.4 MG SL tablet Place under the tongue.    . Omega-3 Fatty Acids (FISH OIL PEARLS) 183.33 MG CAPS every morning.    . traMADol (ULTRAM) 50 MG tablet Take 100 mg by mouth 4 (four) times daily.    . TRESIBA FLEXTOUCH 100 UNIT/ML SOPN FlexTouch Pen Inject 0.6 mLs (60 Units total) into the skin daily. 18 mL 2  . umeclidinium-vilanterol (ANORO ELLIPTA) 62.5-25 MCG/INH AEPB Inhale 1 puff into the lungs daily.    Marland Kitchen zolpidem (AMBIEN) 10 MG tablet TAKE 1 TABLET(10 MG) BY MOUTH EVERY DAY AT BEDTIME 30 tablet 2  . atorvastatin (LIPITOR) 40 MG tablet Take 1 tablet (40 mg total) by mouth 1 day or 1 dose for 1 dose. 90 tablet 2  . insulin degludec (TRESIBA FLEXTOUCH) 100 UNIT/ML SOPN FlexTouch Pen Inject 42 Units into the skin every morning.     No facility-administered medications prior to visit.    No Known Allergies  ROS Review of Systems  Constitutional: Negative.   HENT: Negative.   Eyes: Negative.   Respiratory: Negative.   Cardiovascular: Negative.   Gastrointestinal: Negative.   Endocrine: Negative.   Genitourinary: Negative.   Musculoskeletal: Negative.   Skin: Negative.   Neurological: Positive for numbness.  Psychiatric/Behavioral: Negative.       Objective:    Physical Exam  Constitutional: He is oriented to person, place, and time. He appears well-developed and well-nourished.  HENT:  Head: Normocephalic and atraumatic.  Eyes: Pupils are equal, round, and reactive to light. Conjunctivae and EOM are normal.  Cardiovascular: Normal rate, regular rhythm and normal heart sounds.  Pulmonary/Chest: Effort normal and breath  sounds normal.  Abdominal: Soft. Bowel sounds are normal.  Musculoskeletal:        General: Normal range of motion.     Cervical back: Normal range of motion and neck supple.  Neurological: He is alert and oriented to person, place, and time. He has normal reflexes.  Skin: Skin is warm and dry.  Psychiatric: He has a normal mood and affect.  Vitals reviewed.   BP 132/80   Pulse 88   Temp 97.7 F (36.5 C)   Resp 16   Ht 5\' 4"  (1.626 m)   Wt 229 lb (103.9 kg)   SpO2 97%   BMI 39.31 kg/m  Wt Readings from Last 3 Encounters:  02/23/20 229  lb (103.9 kg)     Health Maintenance Due  Topic Date Due  . PNEUMOCOCCAL POLYSACCHARIDE VACCINE AGE 85-64 HIGH RISK  Never done  . OPHTHALMOLOGY EXAM  Never done  . HIV Screening  Never done  . TETANUS/TDAP  Never done  . COLONOSCOPY  Never done  . INFLUENZA VACCINE  Never done    There are no preventive care reminders to display for this patient.  No results found for: TSH Lab Results  Component Value Date   WBC 8.4 02/23/2020   HGB 17.2 02/23/2020   HCT 50.7 02/23/2020   MCV 92 02/23/2020   PLT 243 02/23/2020   Lab Results  Component Value Date   NA 139 02/23/2020   K 4.8 02/23/2020   CO2 27 02/23/2020   GLUCOSE 208 (H) 02/23/2020   BUN 18 02/23/2020   CREATININE 0.85 02/23/2020   BILITOT 0.3 02/23/2020   ALKPHOS 71 02/23/2020   AST 27 02/23/2020   ALT 48 (H) 02/23/2020   PROT 7.3 02/23/2020   ALBUMIN 4.6 02/23/2020   CALCIUM 9.4 02/23/2020   Lab Results  Component Value Date   CHOL 200 (H) 02/23/2020   Lab Results  Component Value Date   HDL 38 (L) 02/23/2020   Lab Results  Component Value Date   LDLCALC 100 (H) 02/23/2020   Lab Results  Component Value Date   TRIG 369 (H) 02/23/2020   Lab Results  Component Value Date   CHOLHDL 5.3 (H) 02/23/2020   Lab Results  Component Value Date   HGBA1C 8.9 (H) 02/23/2020      Assessment & Plan:   Problem List Items Addressed This Visit       Cardiovascular and Mediastinum   Essential hypertension    An individual care plan was established and reinforced today.  The patient's status was assessed using clinical findings on exam and labs or diagnostic tests. The patient's success at meeting treatment goals on disease specific evidence-based guidelines and found to be well  controlled. SELF MANAGEMENT: The patient and I together assessed ways to personally work towards obtaining the recommended goals. RECOMMENDATIONS: avoid decongestants found in common cold remedies, decrease consumption of alcohol, perform routine monitoring of BP with home BP cuff, exercise, reduction of dietary salt, take medicines as prescribed, try not to miss doses and quit smoking.  Regular exercise and maintaining a healthy weight is needed.  Stress reduction may help. A CLINICAL SUMMARY including written plan identify barriers to care unique to individual due to social or financial issues.  We attempt to mutually creat solutions for individual and family understanding.      Relevant Medications   losartan (COZAAR) 100 MG tablet   nitroGLYCERIN (NITROSTAT) 0.4 MG SL tablet   metoprolol succinate (TOPROL-XL) 50 MG 24 hr tablet   Other Relevant Orders   CBC with Differential (Completed)   Comprehensive metabolic panel (Completed)     Respiratory   COPD (chronic obstructive pulmonary disease) ([place])    An individualize plan was formulated for care of COPD.  Treatment is evidence based.  She will continue on inhalers, avoid smoking and smoke.  Regular exercise with help with dyspnea. Routine follow ups and medication compliance is needed.      Relevant Medications   albuterol (VENTOLIN HFA) 108 (90 Base) MCG/ACT inhaler   umeclidinium-vilanterol (ANORO ELLIPTA) 62.5-25 MCG/INH AEPB     Endocrine   Type 2 diabetes mellitus with diabetic polyneuropathy ([place]) - Primary    An individual care plan was  established and reinforced today.  The patient's status was  assessed using clinical findings on exam, labs and diagnostic testing. Patient success at meeting goals based on disease specific evidence-based guidelines and found to be good controlled. Medications were assessed and patient's understanding of the medical issues , including barriers were assessed. Recommend adherence to a diabetic diet, a graduated exercise program, HgbA1c level is checked quarterly, and urine microalbumin performed yearly .  Annual mono-filament sensation testing performed. Lower blood pressure and control hyperlipidemia is important. Get annual eye exams and annual flu shots and smoking cessation discussed.  Self management goals were discussed.      Relevant Medications   losartan (COZAAR) 100 MG tablet   metFORMIN (GLUCOPHAGE) 500 MG tablet   Gabapentin, Once-Daily, (GRALISE) 600 MG TABS   ALPRAZolam (XANAX) 0.5 MG tablet   Other Relevant Orders   Hemoglobin A1c (Completed)     Other   Hyperlipidemia    AN INDIVIDUAL CARE PLAN was established and reinforced today.  The patient's status was assessed using clinical findings on exam, lab and other diagnostic tests. The patient's disease status was assessed based on evidence-based guidelines and found to be well controlled. MEDICATIONS were reviewed. SELF MANAGEMENT GOALS have been discussed and patient's success at attaining the goal of low cholesterol was assessed. RECOMMENDATION given include regular exercise 3 days a week and low cholesterol/low fat diet. CLINICAL SUMMARY including written plan to identify barriers unique to the patient due to social or economic  reasons was discussed.      Relevant Medications   losartan (COZAAR) 100 MG tablet   nitroGLYCERIN (NITROSTAT) 0.4 MG SL tablet   metoprolol succinate (TOPROL-XL) 50 MG 24 hr tablet   Other Relevant Orders   Lipid Panel (Completed)      No orders of the defined types were placed in this encounter.   Follow-up: Return in about 4 months (around  06/24/2020) for fasting.    Brent Bulla, MD

## 2020-02-24 LAB — CBC WITH DIFFERENTIAL/PLATELET
Basophils Absolute: 0.1 10*3/uL (ref 0.0–0.2)
Basos: 1 %
EOS (ABSOLUTE): 0.2 10*3/uL (ref 0.0–0.4)
Eos: 2 %
Hematocrit: 50.7 % (ref 37.5–51.0)
Hemoglobin: 17.2 g/dL (ref 13.0–17.7)
Immature Grans (Abs): 0 10*3/uL (ref 0.0–0.1)
Immature Granulocytes: 1 %
Lymphocytes Absolute: 1.8 10*3/uL (ref 0.7–3.1)
Lymphs: 22 %
MCH: 31.2 pg (ref 26.6–33.0)
MCHC: 33.9 g/dL (ref 31.5–35.7)
MCV: 92 fL (ref 79–97)
Monocytes Absolute: 0.7 10*3/uL (ref 0.1–0.9)
Monocytes: 9 %
Neutrophils Absolute: 5.6 10*3/uL (ref 1.4–7.0)
Neutrophils: 65 %
Platelets: 243 10*3/uL (ref 150–450)
RBC: 5.52 x10E6/uL (ref 4.14–5.80)
RDW: 12.2 % (ref 11.6–15.4)
WBC: 8.4 10*3/uL (ref 3.4–10.8)

## 2020-02-24 LAB — LIPID PANEL
Chol/HDL Ratio: 5.3 ratio — ABNORMAL HIGH (ref 0.0–5.0)
Cholesterol, Total: 200 mg/dL — ABNORMAL HIGH (ref 100–199)
HDL: 38 mg/dL — ABNORMAL LOW (ref >39)
LDL Chol Calc (NIH): 100 mg/dL — ABNORMAL HIGH (ref 0–99)
Triglycerides: 369 mg/dL — ABNORMAL HIGH (ref 0–149)
VLDL Cholesterol Cal: 62 mg/dL — ABNORMAL HIGH (ref 5–40)

## 2020-02-24 LAB — HEMOGLOBIN A1C
Est. average glucose Bld gHb Est-mCnc: 209 mg/dL
Hgb A1c MFr Bld: 8.9 % — ABNORMAL HIGH (ref 4.8–5.6)

## 2020-02-24 LAB — COMPREHENSIVE METABOLIC PANEL
ALT: 48 IU/L — ABNORMAL HIGH (ref 0–44)
AST: 27 IU/L (ref 0–40)
Albumin/Globulin Ratio: 1.7 (ref 1.2–2.2)
Albumin: 4.6 g/dL (ref 3.8–4.9)
Alkaline Phosphatase: 71 IU/L (ref 39–117)
BUN/Creatinine Ratio: 21 — ABNORMAL HIGH (ref 9–20)
BUN: 18 mg/dL (ref 6–24)
Bilirubin Total: 0.3 mg/dL (ref 0.0–1.2)
CO2: 27 mmol/L (ref 20–29)
Calcium: 9.4 mg/dL (ref 8.7–10.2)
Chloride: 98 mmol/L (ref 96–106)
Creatinine, Ser: 0.85 mg/dL (ref 0.76–1.27)
GFR calc Af Amer: 113 mL/min/{1.73_m2} (ref >59)
GFR calc non Af Amer: 98 mL/min/{1.73_m2} (ref >59)
Globulin, Total: 2.7 g/dL (ref 1.5–4.5)
Glucose: 208 mg/dL — ABNORMAL HIGH (ref 65–99)
Potassium: 4.8 mmol/L (ref 3.5–5.2)
Sodium: 139 mmol/L (ref 134–144)
Total Protein: 7.3 g/dL (ref 6.0–8.5)

## 2020-02-24 LAB — CARDIOVASCULAR RISK ASSESSMENT

## 2020-02-24 NOTE — Progress Notes (Signed)
CBC normal, glucose 208, idney tests normal, one liver tests ellevated. Cholesterol high with high triglycerides- watch diet and take medicines, A1c 8.9 very high- need to get back on diet lp

## 2020-02-24 NOTE — Assessment & Plan Note (Signed)

## 2020-02-24 NOTE — Assessment & Plan Note (Signed)

## 2020-02-24 NOTE — Assessment & Plan Note (Signed)

## 2020-02-24 NOTE — Assessment & Plan Note (Signed)
An individualize plan was formulated for care of COPD.  Treatment is evidence based.  She will continue on inhalers, avoid smoking and smoke.  Regular exercise with help with dyspnea. Routine follow ups and medication compliance is needed. 

## 2020-03-22 ENCOUNTER — Other Ambulatory Visit: Payer: Self-pay | Admitting: Legal Medicine

## 2020-03-22 DIAGNOSIS — E1142 Type 2 diabetes mellitus with diabetic polyneuropathy: Secondary | ICD-10-CM

## 2020-03-29 ENCOUNTER — Other Ambulatory Visit: Payer: Self-pay | Admitting: Family Medicine

## 2020-03-29 DIAGNOSIS — M8949 Other hypertrophic osteoarthropathy, multiple sites: Secondary | ICD-10-CM

## 2020-03-29 DIAGNOSIS — M159 Polyosteoarthritis, unspecified: Secondary | ICD-10-CM

## 2020-04-15 DIAGNOSIS — I10 Essential (primary) hypertension: Secondary | ICD-10-CM | POA: Diagnosis not present

## 2020-04-15 DIAGNOSIS — Z6838 Body mass index (BMI) 38.0-38.9, adult: Secondary | ICD-10-CM | POA: Diagnosis not present

## 2020-06-25 ENCOUNTER — Ambulatory Visit: Payer: BC Managed Care – PPO | Admitting: Legal Medicine

## 2020-06-28 ENCOUNTER — Other Ambulatory Visit: Payer: Self-pay | Admitting: Legal Medicine

## 2020-06-28 DIAGNOSIS — M150 Primary generalized (osteo)arthritis: Secondary | ICD-10-CM

## 2020-06-28 DIAGNOSIS — M159 Polyosteoarthritis, unspecified: Secondary | ICD-10-CM

## 2020-06-28 DIAGNOSIS — M8949 Other hypertrophic osteoarthropathy, multiple sites: Secondary | ICD-10-CM

## 2020-08-12 ENCOUNTER — Ambulatory Visit: Payer: BC Managed Care – PPO | Admitting: Legal Medicine

## 2020-08-12 ENCOUNTER — Encounter: Payer: Self-pay | Admitting: Legal Medicine

## 2020-08-12 VITALS — BP 142/86 | HR 63 | Temp 97.3°F | Ht 65.0 in | Wt 230.0 lb

## 2020-08-12 DIAGNOSIS — G4733 Obstructive sleep apnea (adult) (pediatric): Secondary | ICD-10-CM | POA: Diagnosis not present

## 2020-08-12 DIAGNOSIS — E119 Type 2 diabetes mellitus without complications: Secondary | ICD-10-CM | POA: Diagnosis not present

## 2020-08-12 DIAGNOSIS — R079 Chest pain, unspecified: Secondary | ICD-10-CM | POA: Diagnosis not present

## 2020-08-12 DIAGNOSIS — Z87891 Personal history of nicotine dependence: Secondary | ICD-10-CM | POA: Diagnosis not present

## 2020-08-12 DIAGNOSIS — I272 Pulmonary hypertension, unspecified: Secondary | ICD-10-CM | POA: Diagnosis not present

## 2020-08-12 DIAGNOSIS — I4891 Unspecified atrial fibrillation: Secondary | ICD-10-CM | POA: Diagnosis not present

## 2020-08-12 DIAGNOSIS — R000 Tachycardia, unspecified: Secondary | ICD-10-CM | POA: Diagnosis not present

## 2020-08-12 DIAGNOSIS — J449 Chronic obstructive pulmonary disease, unspecified: Secondary | ICD-10-CM

## 2020-08-12 DIAGNOSIS — I471 Supraventricular tachycardia: Secondary | ICD-10-CM | POA: Diagnosis not present

## 2020-08-12 DIAGNOSIS — I350 Nonrheumatic aortic (valve) stenosis: Secondary | ICD-10-CM | POA: Diagnosis not present

## 2020-08-12 DIAGNOSIS — R0602 Shortness of breath: Secondary | ICD-10-CM | POA: Diagnosis not present

## 2020-08-12 DIAGNOSIS — I5020 Unspecified systolic (congestive) heart failure: Secondary | ICD-10-CM | POA: Diagnosis not present

## 2020-08-12 DIAGNOSIS — B1920 Unspecified viral hepatitis C without hepatic coma: Secondary | ICD-10-CM | POA: Diagnosis not present

## 2020-08-12 DIAGNOSIS — I110 Hypertensive heart disease with heart failure: Secondary | ICD-10-CM | POA: Diagnosis not present

## 2020-08-12 DIAGNOSIS — K7460 Unspecified cirrhosis of liver: Secondary | ICD-10-CM | POA: Diagnosis not present

## 2020-08-12 DIAGNOSIS — I5021 Acute systolic (congestive) heart failure: Secondary | ICD-10-CM | POA: Diagnosis not present

## 2020-08-12 DIAGNOSIS — Z20822 Contact with and (suspected) exposure to covid-19: Secondary | ICD-10-CM | POA: Diagnosis not present

## 2020-08-12 DIAGNOSIS — I4819 Other persistent atrial fibrillation: Secondary | ICD-10-CM | POA: Insufficient documentation

## 2020-08-12 DIAGNOSIS — I519 Heart disease, unspecified: Secondary | ICD-10-CM | POA: Diagnosis not present

## 2020-08-12 DIAGNOSIS — F329 Major depressive disorder, single episode, unspecified: Secondary | ICD-10-CM | POA: Diagnosis not present

## 2020-08-12 DIAGNOSIS — E785 Hyperlipidemia, unspecified: Secondary | ICD-10-CM | POA: Diagnosis not present

## 2020-08-12 DIAGNOSIS — R61 Generalized hyperhidrosis: Secondary | ICD-10-CM | POA: Diagnosis not present

## 2020-08-12 HISTORY — DX: Unspecified atrial fibrillation: I48.91

## 2020-08-12 LAB — POC COVID19 BINAXNOW: SARS Coronavirus 2 Ag: NEGATIVE

## 2020-08-12 NOTE — Progress Notes (Signed)
Subjective:  Patient ID: Marcus Hicks, male    DOB: [birth year]  Age: 54 y.o. MRN: [id number]  Chief Complaint  Patient presents with  . Shortness of Breath    HPI: dyspnea  He woke up with severe dyspnea in AM and trouble showering .  He used his medicines.  Worse for 2 weeks.  He had Covid last year.  He has COPD.  His heart is racing.  He feels better now.   Current Outpatient Medications on File Prior to Visit  Medication Sig Dispense Refill  . albuterol (VENTOLIN HFA) 108 (90 Base) MCG/ACT inhaler Inhale into the lungs every 6 (six) hours as needed for wheezing or shortness of breath.    . ALPRAZolam (XANAX) 0.5 MG tablet Take 0.5 mg by mouth 3 (three) times daily as needed.    . BD PEN NEEDLE NANO 2ND GEN 32G X 4 MM MISC daily. use as directed    . Gabapentin, Once-Daily, (GRALISE) 600 MG TABS Take by mouth.    Marland Kitchen glucose blood (CONTOUR NEXT TEST) test strip Use to check blood sugar twice daily. Dx: E11.42    . losartan (COZAAR) 100 MG tablet Take by mouth.    . meloxicam (MOBIC) 15 MG tablet TAKE 1 TABLET(15 MG) BY MOUTH EVERY DAY 90 tablet 2  . metFORMIN (GLUCOPHAGE) 500 MG tablet TAKE 1 TABLET BY MOUTH TWICE DAILY 60 tablet 6  . metoprolol succinate (TOPROL-XL) 50 MG 24 hr tablet Take 50 mg by mouth daily.    . Microlet Lancets MISC USE TO CHECK BLOOD SUGAR EVERY MORNING AND AFTER MEALS    . nitroGLYCERIN (NITROSTAT) 0.4 MG SL tablet Place under the tongue.    . Omega-3 Fatty Acids (FISH OIL PEARLS) 183.33 MG CAPS every morning.    . traMADol (ULTRAM) 50 MG tablet Take 100 mg by mouth 4 (four) times daily.    . TRESIBA FLEXTOUCH 100 UNIT/ML SOPN FlexTouch Pen Inject 0.6 mLs (60 Units total) into the skin daily. 18 mL 2  . umeclidinium-vilanterol (ANORO ELLIPTA) 62.5-25 MCG/INH AEPB Inhale 1 puff into the lungs daily.    Marland Kitchen zolpidem (AMBIEN) 10 MG tablet TAKE 1 TABLET(10 MG) BY MOUTH EVERY DAY AT BEDTIME 30 tablet 2  . amLODipine (NORVASC) 5 MG tablet Take 5 mg by mouth  daily.    Marland Kitchen atorvastatin (LIPITOR) 40 MG tablet Take 1 tablet (40 mg total) by mouth 1 day or 1 dose for 1 dose. 90 tablet 2   No current facility-administered medications on file prior to visit.   Past Medical History:  Diagnosis Date  . Bicuspid aortic valve   . COPD (chronic obstructive pulmonary disease) (HCC)   . Diabetes mellitus type 2, insulin dependent (HCC)    History reviewed. No pertinent surgical history.  Family History  Problem Relation Age of Onset  . Cancer Maternal Grandfather    Social History   Socioeconomic History  . Marital status: Married    Spouse name: Not on file  . Number of children: Not on file  . Years of education: Not on file  . Highest education level: Not on file  Occupational History  . Not on file  Tobacco Use  . Smoking status: Former Smoker    Quit date: 2007    Years since quitting: 14.7  . Smokeless tobacco: Former Neurosurgeon    Quit date: 2015  Advertising account planner  . Vaping Use: Never used  Substance and Sexual Activity  . Alcohol use: Not Currently  .  Drug use: Never  . Sexual activity: Not on file  Other Topics Concern  . Not on file  Social History Narrative  . Not on file   Social Determinants of Health   Financial Resource Strain:   . Difficulty of Paying Living Expenses: Not on file  Food Insecurity:   . Worried About Programme researcher, broadcasting/film/video in the Last Year: Not on file  . Ran Out of Food in the Last Year: Not on file  Transportation Needs:   . Lack of Transportation (Medical): Not on file  . Lack of Transportation (Non-Medical): Not on file  Physical Activity:   . Days of Exercise per Week: Not on file  . Minutes of Exercise per Session: Not on file  Stress:   . Feeling of Stress : Not on file  Social Connections:   . Frequency of Communication with Friends and Family: Not on file  . Frequency of Social Gatherings with Friends and Family: Not on file  . Attends Religious Services: Not on file  . Active Member of Clubs or  Organizations: Not on file  . Attends Banker Meetings: Not on file  . Marital Status: Not on file    Review of Systems  Constitutional: Negative.   HENT: Negative.   Eyes: Negative.   Respiratory: Positive for cough, choking, chest tightness and shortness of breath.        Cannot walk fast.  Gets Sob and chest pain  Cardiovascular: Positive for chest pain. Negative for palpitations and leg swelling.  Endocrine: Negative.   Genitourinary: Negative.   Neurological: Negative.      Objective:  BP (!) 142/86   Pulse 63   Temp (!) 97.3 F (36.3 C)   Ht 5\' 5"  (1.651 m)   Wt 230 lb (104.3 kg)   SpO2 99%   BMI 38.27 kg/m   BP/Weight 08/12/2020 02/23/2020 02/09/2020  Systolic BP 142 132 135  Diastolic BP 86 80 76  Wt. (Lbs) 230 229 -  BMI 38.27 39.31 -    Physical Exam Constitutional:      Appearance: He is well-developed.  HENT:     Head: Normocephalic.     Mouth/Throat:     Pharynx: Oropharynx is clear.  Eyes:     Extraocular Movements: Extraocular movements intact.     Pupils: Pupils are equal, round, and reactive to light.  Cardiovascular:     Rate and Rhythm: Tachycardia present. Rhythm irregular.     Pulses: Normal pulses.     Heart sounds: Gallop present.   Pulmonary:     Effort: Pulmonary effort is normal.     Breath sounds: Normal breath sounds.  Chest:     Chest wall: No mass, deformity, tenderness, crepitus or edema. There is no dullness to percussion.  Abdominal:     Palpations: Abdomen is soft.  Musculoskeletal:        General: Normal range of motion.     Cervical back: Neck supple.  Skin:    General: Skin is warm and dry.     Capillary Refill: Capillary refill takes less than 2 seconds.  Neurological:     General: No focal deficit present.     Mental Status: He is alert.  Psychiatric:     Comments: He is very depressed about wife's death    EKG atrial fibrillation ventricular response 126, inferior changes   Lab Results   Component Value Date   WBC 8.4 02/23/2020   HGB 17.2 02/23/2020  HCT 50.7 02/23/2020   PLT 243 02/23/2020   GLUCOSE 208 (H) 02/23/2020   CHOL 200 (H) 02/23/2020   TRIG 369 (H) 02/23/2020   HDL 38 (L) 02/23/2020   LDLCALC 100 (H) 02/23/2020   ALT 48 (H) 02/23/2020   AST 27 02/23/2020   NA 139 02/23/2020   K 4.8 02/23/2020   CL 98 02/23/2020   CREATININE 0.85 02/23/2020   BUN 18 02/23/2020   CO2 27 02/23/2020   HGBA1C 8.9 (H) 02/23/2020      Assessment & Plan:   1. Chronic obstructive pulmonary disease, unspecified COPD type (HCC) - POC COVID-19 Patient is having progressive dyspnea for 2 weeks with exercise intolerance  2. Chest pain, unspecified type - EKG 12-Lead He is getting intermittent chest pressure with no radiation but he gets sweaty and dyspneic.  He has risk factors of age, DM, hypertension and hypercholesterolemia with obesity.  3. Atrial fibrillation with rapid ventricular response (HCC) EKG shows new atrial fibrillation with rapid ventricular response and inferior EKG changes.  I recommended ambulance transfer to hospital.  He refused and is going to have his children bring him to [facility] where is has charity care.  I discussed the risks of not being monitored and accepted risks.      Orders Placed This Encounter  Procedures  . POC COVID-19  . EKG 12-Lead     Follow-up: Return transfer to [facility] by auto at patient's insistance.  An After Visit Summary was printed and given to the patient.  Brent Bulla [facility] [phone]

## 2020-08-22 ENCOUNTER — Other Ambulatory Visit: Payer: Self-pay

## 2020-08-22 ENCOUNTER — Encounter: Payer: Self-pay | Admitting: Legal Medicine

## 2020-08-22 ENCOUNTER — Ambulatory Visit: Payer: BC Managed Care – PPO | Admitting: Legal Medicine

## 2020-08-22 VITALS — BP 110/60 | HR 70 | Temp 97.6°F | Resp 17 | Ht 65.0 in | Wt 230.0 lb

## 2020-08-22 DIAGNOSIS — I5020 Unspecified systolic (congestive) heart failure: Secondary | ICD-10-CM

## 2020-08-22 DIAGNOSIS — I4891 Unspecified atrial fibrillation: Secondary | ICD-10-CM

## 2020-08-22 DIAGNOSIS — I5023 Acute on chronic systolic (congestive) heart failure: Secondary | ICD-10-CM

## 2020-08-22 DIAGNOSIS — J449 Chronic obstructive pulmonary disease, unspecified: Secondary | ICD-10-CM | POA: Diagnosis not present

## 2020-08-22 HISTORY — DX: Unspecified systolic (congestive) heart failure: I50.20

## 2020-08-22 MED ORDER — ALBUTEROL SULFATE (2.5 MG/3ML) 0.083% IN NEBU: 2.5000 mg | INHALATION_SOLUTION | Freq: Four times a day (QID) | RESPIRATORY_TRACT | 3 refills | Status: DC | PRN

## 2020-08-22 MED ORDER — FUROSEMIDE 20 MG PO TABS: 20.0000 mg | ORAL_TABLET | Freq: Every day | ORAL | 2 refills | Status: DC

## 2020-08-22 NOTE — Progress Notes (Signed)
Subjective:  Patient ID: Marcus Hicks, male    DOB: 1966-07-18  Age: 54 y.o. MRN: [id number]  Chief Complaint  Patient presents with  . Transitions Of Care    Patient went to [facility] and he was admitted from 08/12/2020 to 08/14/2020.  Marland Kitchen Atrial Fibrillation    HPI: patient presents for transition of care and reconciliation of medicines.  He was admitted to [facility] for atrial fibrillation with rapid ventricular response on 08/12/2020.  He was stablized and discharged on 9/15/202.  He has CHF with reduced injection fraction < 50%. He is on metoprolol 100mg . He lost 8 lbs in hospital.   Current Outpatient Medications on File Prior to Visit  Medication Sig Dispense Refill  . albuterol (VENTOLIN HFA) 108 (90 Base) MCG/ACT inhaler Inhale into the lungs every 6 (six) hours as needed for wheezing or shortness of breath.    . ALPRAZolam (XANAX) 0.5 MG tablet Take 0.5 mg by mouth 3 (three) times daily as needed.    amLODipine (NORVASC) 5 MG tablet Take 5 mg by mouth daily.    . BD PEN NEEDLE NANO 2ND GEN 32G X 4 MM MISC daily. use as directed    . ELIQUIS 5 MG TABS tablet Take 5 mg by mouth 2 (two) times daily.    . Gabapentin, Once-Daily, (GRALISE) 600 MG TABS Take by mouth.    Marland Kitchen glucose blood (CONTOUR NEXT TEST) test strip Use to check blood sugar twice daily. Dx: E11.42    . losartan (COZAAR) 100 MG tablet Take by mouth.    . meloxicam (MOBIC) 15 MG tablet TAKE 1 TABLET(15 MG) BY MOUTH EVERY DAY 90 tablet 2  . metFORMIN (GLUCOPHAGE) 500 MG tablet TAKE 1 TABLET BY MOUTH TWICE DAILY 60 tablet 6  . metoprolol succinate (TOPROL-XL) 50 MG 24 hr tablet Take 50 mg by mouth daily.    . Microlet Lancets MISC USE TO CHECK BLOOD SUGAR EVERY MORNING AND AFTER MEALS    . Omega-3 Fatty Acids (FISH OIL PEARLS) 183.33 MG CAPS every morning.    . traMADol (ULTRAM) 50 MG tablet Take 100 mg by mouth 4 (four) times daily.    . TRESIBA FLEXTOUCH 100 UNIT/ML SOPN FlexTouch Pen Inject 0.6 mLs (60 Units total) into  the skin daily. 18 mL 2  . umeclidinium-vilanterol (ANORO ELLIPTA) 62.5-25 MCG/INH AEPB Inhale 1 puff into the lungs daily.    12-29-1985 zolpidem (AMBIEN) 10 MG tablet TAKE 1 TABLET(10 MG) BY MOUTH EVERY DAY AT BEDTIME 30 tablet 2  . atorvastatin (LIPITOR) 40 MG tablet Take 1 tablet (40 mg total) by mouth 1 day or 1 dose for 1 dose. 90 tablet 2   No current facility-administered medications on file prior to visit.   Past Medical History:  Diagnosis Date  . Bicuspid aortic valve   . COPD (chronic obstructive pulmonary disease) (HCC)   . Diabetes mellitus type 2, insulin dependent (HCC)    History reviewed. No pertinent surgical history.  Family History  Problem Relation Age of Onset  . Cancer Maternal Grandfather    Social History   Socioeconomic History  . Marital status: Married    Spouse name: Not on file  . Number of children: Not on file  . Years of education: Not on file  . Highest education level: Not on file  Occupational History  . Not on file  Tobacco Use  . Smoking status: Former Smoker    Quit date: 2007    Years since quitting: 14.7  .  Smokeless tobacco: Former Neurosurgeon    Quit date: 2015  Advertising account planner  . Vaping Use: Never used  Substance and Sexual Activity  . Alcohol use: Not Currently  . Drug use: Never  . Sexual activity: Not on file  Other Topics Concern  . Not on file  Social History Narrative  . Not on file   Social Determinants of Health   Financial Resource Strain:   . Difficulty of Paying Living Expenses: Not on file  Food Insecurity:   . Worried About Programme researcher, broadcasting/film/video in the Last Year: Not on file  . Ran Out of Food in the Last Year: Not on file  Transportation Needs:   . Lack of Transportation (Medical): Not on file  . Lack of Transportation (Non-Medical): Not on file  Physical Activity:   . Days of Exercise per Week: Not on file  . Minutes of Exercise per Session: Not on file  Stress:   . Feeling of Stress : Not on file  Social Connections:    . Frequency of Communication with Friends and Family: Not on file  . Frequency of Social Gatherings with Friends and Family: Not on file  . Attends Religious Services: Not on file  . Active Member of Clubs or Organizations: Not on file  . Attends Banker Meetings: Not on file  . Marital Status: Not on file    Review of Systems  Constitutional: Negative.   HENT: Negative.   Eyes: Negative.   Respiratory: Negative for shortness of breath.   Cardiovascular: Negative for chest pain, palpitations and leg swelling.  Endocrine: Negative.   Genitourinary: Negative.   Neurological: Negative.   Psychiatric/Behavioral: Negative.      Objective:  BP 110/60   Pulse 70   Temp 97.6 F (36.4 C)   Resp 17   Ht 5\' 5"  (1.651 m)   Wt 230 lb (104.3 kg)   BMI 38.27 kg/m   BP/Weight 08/22/2020 08/12/2020 02/23/2020  Systolic BP 110 142 132  Diastolic BP 60 86 80  Wt. (Lbs) 230 230 229  BMI 38.27 38.27 39.31    Physical Exam Vitals reviewed.  Constitutional:      Appearance: Normal appearance.  HENT:     Head: Normocephalic and atraumatic.     Right Ear: Tympanic membrane, ear canal and external ear normal.     Left Ear: Tympanic membrane, ear canal and external ear normal.  Eyes:     Extraocular Movements: Extraocular movements intact.     Conjunctiva/sclera: Conjunctivae normal.     Pupils: Pupils are equal, round, and reactive to light.  Cardiovascular:     Rate and Rhythm: Normal rate. Rhythm irregular.     Pulses: Normal pulses.     Heart sounds: Normal heart sounds.  Pulmonary:     Effort: Pulmonary effort is normal.     Breath sounds: Normal breath sounds.  Abdominal:     General: Abdomen is flat. Bowel sounds are normal.     Palpations: Abdomen is soft.  Musculoskeletal:        General: Normal range of motion.     Cervical back: Rigidity present.  Skin:    General: Skin is warm and dry.     Capillary Refill: Capillary refill takes less than 2 seconds.   Neurological:     General: No focal deficit present.     Mental Status: He is alert and oriented to person, place, and time.     Diabetic Foot Exam -  Simple   No data filed       Lab Results  Component Value Date   WBC 8.4 02/23/2020   HGB 17.2 02/23/2020   HCT 50.7 02/23/2020   PLT 243 02/23/2020   GLUCOSE 208 (H) 02/23/2020   CHOL 200 (H) 02/23/2020   TRIG 369 (H) 02/23/2020   HDL 38 (L) 02/23/2020   LDLCALC 100 (H) 02/23/2020   ALT 48 (H) 02/23/2020   AST 27 02/23/2020   NA 139 02/23/2020   K 4.8 02/23/2020   CL 98 02/23/2020   CREATININE 0.85 02/23/2020   BUN 18 02/23/2020   CO2 27 02/23/2020   HGBA1C 8.9 (H) 02/23/2020      Assessment & Plan:   1. Atrial fibrillation with rapid ventricular response (HCC) - Ambulatory referral to Cardiology AN INDIVIDUAL CARE PLAN for atrial fibrillation was established and reinforced today.  The patient's status was assessed using clinical findings on exam, labs, and other diagnostic testing. Patient's success at meeting treatment goals based on disease specific evidence-bassed guidelines and found to be in good control. RECOMMENDATIONS include maintaining present medicines and treatment.  2. Acute on chronic systolic heart failure (HCC) - furosemide (LASIX) 20 MG tablet; Take 1 tablet (20 mg total) by mouth daily.  Dispense: 90 tablet; Refill: 2 Patietn had CHF in hospital requiring diuresis EF 45-50%  3. Chronic obstructive pulmonary disease, unspecified COPD type (HCC) - albuterol (PROVENTIL) (2.5 MG/3ML) 0.083% nebulizer solution; Take 3 mLs (2.5 mg total) by nebulization every 6 (six) hours as needed for wheezing or shortness of breath.  Dispense: 150 mL; Refill: 3 An individualize plan was formulated for care of COPD.  Treatment is evidence based.  She will continue on inhalers, avoid smoking and smoke.  Regular exercise with help with dyspnea. Routine follow ups and medication compliance is needed.  4. HFrEF (heart  failure with reduced ejection fraction) (HCC) Patients 2Decho showed reduced EF 45-50%, refer to cardiology    Meds ordered this encounter  Medications  . furosemide (LASIX) 20 MG tablet    Sig: Take 1 tablet (20 mg total) by mouth daily.    Dispense:  90 tablet    Refill:  2  . albuterol (PROVENTIL) (2.5 MG/3ML) 0.083% nebulizer solution    Sig: Take 3 mLs (2.5 mg total) by nebulization every 6 (six) hours as needed for wheezing or shortness of breath.    Dispense:  150 mL    Refill:  3    Orders Placed This Encounter  Procedures  . Ambulatory referral to Cardiology     Follow-up: Return in about 3 months (around 11/21/2020) for fasting.  An After Visit Summary was printed and given to the patient.  Brent Bulla [facility] [phone]

## 2020-08-23 ENCOUNTER — Other Ambulatory Visit: Payer: Self-pay | Admitting: Legal Medicine

## 2020-08-23 ENCOUNTER — Ambulatory Visit: Payer: BC Managed Care – PPO | Admitting: Cardiology

## 2020-08-30 NOTE — Progress Notes (Signed)
Cardiology Office Note:    Date:  09/02/2020   ID:  [address] Marcus Hicks, [place] [birth year], MRN [id number]  PCP:  Marcus Miyamoto, MD  Cardiologist:  Marcus Herrlich, MD   Referring MD: Marcus Hicks consider SGLT2 inhibitor as opposed to Metformin with heart failure  ASSESSMENT:    1. HFrEF (heart failure with reduced ejection fraction) (HCC)   2. Hypertensive heart disease with chronic systolic congestive heart failure (HCC)   3. Atrial fibrillation with rapid ventricular response (HCC)   4. Cirrhosis of liver without ascites, unspecified hepatic cirrhosis type (HCC)   5. Chronic anticoagulation   6. Bicuspid aortic valve   7. Nonrheumatic aortic valve stenosis   8. Mild CAD   9. Mixed hyperlipidemia   10. Type 2 diabetes mellitus with diabetic polyneuropathy, with long-term current use of insulin (HCC)    PLAN:    In order of problems listed above:  1. 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  Continue beta-blocker loop diuretic optimize treatment transition ARB to Eliquis reassess renal function and if creatinine and potassium are normal initiate MRA.  Of importance here is reestablishing sinus rhythm after he has finished his first prescription of Eliquis will consider cardioversion if ineffective refer to EP. 2. See above BP at target continue his current diuretic optimize treatment by transition to [facility] and initiating MRA if potassium renal function preserved 3. Continue beta-blocker consider cardioversion in the next few weeks 4. I am concerned about how good of a Marcus Hicks is long-term for anticoagulation will make an appointment to be seen by GI regarding cirrhosis clinically has never bled 5. Stable valvular heart disease with a bicuspid valve and mild stenosis 6. Stable CAD mild nonobstructive continue medical therapy 7. Continue statin with CAD 8. Consider  alternative therapy to Metformin with heart failure, heart failure perspective SGLT2 inhibitor would be ideal.  Next appointment 2 weeks   Medication Adjustments/Labs and Tests Ordered: Current medicines are reviewed at length with the patient today.  Concerns regarding medicines are outlined above.  Orders Placed This Encounter  Procedures  . Basic metabolic panel  . Pro b natriuretic peptide (BNP)  . EKG 12-Lead   Meds ordered this encounter  Medications  . sacubitril-valsartan (ENTRESTO) 49-51 MG    Sig: Take 1 tablet by mouth 2 (two) times daily.    Dispense:  60 tablet    Refill:  3     Chief Complaint  Patient presents with  . Follow-up  . Congestive Heart Failure  . Atrial Fibrillation    History of Present Illness:    Marcus Hicks is a 54 y.o. male who has been followed by cardiology at [facility] with a history of a bicuspid aortic valve hypertension hyperlipidemia type 2 diabetes mellitus obstructive sleep apnea not using CPAP and hepatitis C cirrhosis who is being seen today for the evaluation of atrial fibrillation after hospitalization 08/12/2020 at the request of Marcus Hicks,*.  He was admitted to [facility] recently with rapid atrial fibrillation heart failure BNP 1370 EF 35 to 40%.  Been cared for by [facility] cardiology [facility] Dr. Dayton Hicks with a history of bicuspid aortic valve mild stenosis mild regurgitation hypertension hyperlipidemia HCV cirrhosis COPD obstructive sleep apnea. His evaluation has included a cardiac CTA September 2020 showing very mild LAD less than 25% proximal left anterior descending coronary artery small pulmonary nodules and a bicuspid aortic valve.  His atrial fibrillation has been treated with a beta-blocker  Echocardiogram 08/12/2020: Summary  1. The left ventricleis normal in size with mildly increased wall  thickness.  2. The left ventricular systolic function is moderately decreased, LVEF is    visually estimated at 35-40%.  3. There is grade II diastolic dysfunction (elevated filling pressure).  4. There is moderate aortic valve stenosis.  5. The right ventricle is normal in size, with normal systolic function.  6. There is mild-moderate pulmonary hypertension, estimated pulmonary artery  systolic pressure is 46 mmHg.  7. IVC size and inspiratory change suggest elevated right atrial pressure.  (10-20 mmHg).   Event monitor Zio patch: Performed 01/29/2020 showed rare ventricular ectopy rare supraventricular ectopy and no episodes of atrial fibrillation  Cardiac CTA 08/25/2019: Impression  - Atherosclerotic lesion in the proximal LAD just distal to the takeoff of the first diagonal branch with < 25% stenosis (CAD RADS 1).  - Bicuspid aortic valve.  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  - New subcentimeter pulmonary nodules in the left lung measuring up to 6 mm. Consider follow-up CT chest in 12 months to document stability if patient is at high risk for pulmonary malignancy, per Fleischner criteria.  - Hepatic cirrhosis.  Sadly his wife died in August after cancer illness and home hospice care.  He tells me his own health suffered as he became overwhelmed with taking care of his wife but noticed during this time he is increasingly short of breath even trying to walk outdoors to do garden activities.  This culminated in a hospitalization September with decompensated heart failure reduced ejection fraction and atrial fibrillation with rapid ventricular rate.  He has been anticoagulated tells me he is used about 15 days of his first 30-day prescription of Eliquis he has had no bleeding complication.  He has a history of cirrhosis but has never had clinical bleeding of making arrangements to be seen by GI for follow-up.  He is unaware of the atrial fibrillation in terms of palpitation but notices marked exercise intolerance and exertional shortness of breath.   He is having no edema orthopnea or syncope.  He has had no stroke or TIA.  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  Heart failure is compensated.  Continue his diuretic beta-blocker and to optimize treatment [facility]  Check labs including BMP proBNP regarding initiating MRA.  I will see him back in 2 weeks and will make plans for an attempt at cardioversion.  If ineffective he is best served by referral to EP.  I encouraged him to make his appointment with GI as if he is a long-term poor candidate for anticoagulation watchman device could be considered as an alternative.  His valvular heart disease and CAD are stable.  He told me a year ago he is having episodes of intermittent atrial fibrillation and interrupted and attempted cardiac rehabilitation. He thinks that he has been in atrial fibrillation now since the time of his wife's death. Past Medical History:  Diagnosis Date  . Atrial fibrillation with rapid ventricular response (HCC) 08/12/2020  . Bicuspid aortic valve   . Chronic hepatitis C virus infection (HCC) 08/03/2013  . COPD (chronic obstructive pulmonary disease) (HCC)   . Diabetes mellitus type 2, insulin dependent (HCC)   . Essential hypertension 07/03/2015   Last Assessment & Plan:  Blood pressure is borderline high.  Advised to get better sleep, walk on a regular  basis, decrease weight down to about 210 and maintain, and eliminate tobacco.  . HFrEF (heart failure with reduced ejection fraction) (HCC) 08/22/2020  . Long-term insulin use (HCC) 10/12/2016  . Obesity, unspecified 10/12/2016   Patient advised to lose down to about 210 pounds and maintained.  Regular walking program recommended.  He knows how to lose weight.    Past Surgical History:  Procedure Laterality Date  . CCTA  08/2019    Current Medications: Current Meds  Medication Sig  . albuterol (PROVENTIL) (2.5 MG/3ML) 0.083%  nebulizer solution Take 3 mLs (2.5 mg total) by nebulization every 6 (six) hours as needed for wheezing or shortness of breath.  Marland Kitchen albuterol (VENTOLIN HFA) 108 (90 Base) MCG/ACT inhaler Inhale into the lungs every 6 (six) hours as needed for wheezing or shortness of breath.  . ALPRAZolam (XANAX) 0.5 MG tablet Take 0.5 mg by mouth 3 (three) times daily as needed.  Marland Kitchen atorvastatin (LIPITOR) 40 MG tablet Take 1 tablet (40 mg total) by mouth 1 day or 1 dose for 1 dose.  . BD PEN NEEDLE NANO 2ND GEN 32G X 4 MM MISC daily. use as directed  . ELIQUIS 5 MG TABS tablet Take 5 mg by mouth 2 (two) times daily.  . furosemide (LASIX) 20 MG tablet Take 1 tablet (20 mg total) by mouth daily.  . Gabapentin, Once-Daily, (GRALISE) 600 MG TABS Take by mouth.  Marland Kitchen glucose blood (CONTOUR NEXT TEST) test strip Use to check blood sugar twice daily. Dx: E11.42  . meloxicam (MOBIC) 15 MG tablet TAKE 1 TABLET(15 MG) BY MOUTH EVERY DAY  . metFORMIN (GLUCOPHAGE) 500 MG tablet TAKE 1 TABLET BY MOUTH TWICE DAILY  . metoprolol succinate (TOPROL-XL) 100 MG 24 hr tablet Take 100 mg by mouth daily.   . Microlet Lancets MISC USE TO CHECK BLOOD SUGAR EVERY MORNING AND AFTER MEALS  . Omega-3 Fatty Acids (FISH OIL PEARLS) 183.33 MG CAPS every morning.  . traMADol (ULTRAM) 50 MG tablet Take 100 mg by mouth 4 (four) times daily.  . TRESIBA FLEXTOUCH 100 UNIT/ML SOPN FlexTouch Pen Inject 0.6 mLs (60 Units total) into the skin daily.  Marland Kitchen umeclidinium-vilanterol (ANORO ELLIPTA) 62.5-25 MCG/INH AEPB Inhale 1 puff into the lungs daily.  Marland Kitchen zolpidem (AMBIEN) 10 MG tablet TAKE 1 TABLET(10 MG) BY MOUTH EVERY DAY AT BEDTIME  . [DISCONTINUED] amLODipine (NORVASC) 5 MG tablet Take 5 mg by mouth daily.  . [DISCONTINUED] losartan (COZAAR) 100 MG tablet Take by mouth.     Allergies:   Patient has no known allergies.   Social History   Socioeconomic History  . Marital status: Married    Spouse name: Not on file  . Number of children: Not on file    . Years of education: Not on file  . Highest education level: Not on file  Occupational History  . Not on file  Tobacco Use  . Smoking status: Former Smoker    Quit date: 2007    Years since quitting: 14.7  . Smokeless tobacco: Former Neurosurgeon    Quit date: 2015  Advertising account planner  . Vaping Use: Never used  Substance and Sexual Activity  . Alcohol use: Not Currently  . Drug use: Never  . Sexual activity: Not on file  Other Topics Concern  . Not on file  Social History Narrative  . Not on file   Social Determinants of Health   Financial Resource Strain:   . Difficulty of Paying Living Expenses: Not on file  Food Insecurity:   . Worried About Programme researcher, broadcasting/film/video in the Last Year: Not on file  . Ran Out of Food in the Last Year: Not on file  Transportation Needs:   . Lack of Transportation (Medical): Not on file  . Lack of Transportation (Non-Medical): Not on file  Physical Activity:   . Days of Exercise per Week: Not on file  . Minutes of Exercise per Session: Not on file  Stress:   . Feeling of Stress : Not on file  Social Connections:   . Frequency of Communication with Friends and Family: Not on file  . Frequency of Social Gatherings with Friends and Family: Not on file  . Attends Religious Services: Not on file  . Active Member of Clubs or Organizations: Not on file  . Attends Banker Meetings: Not on file  . Marital Status: Not on file     Family History: The patient's family history includes Alzheimer's disease in an other family member; Cancer in his maternal grandfather; Hypertension in an other family member; Liver disease in his sister; Memory loss in an other family member.  ROS:   ROS Please see the history of present illness.     All other systems reviewed and are negative.  EKGs/Labs/Other Studies Reviewed:    The following studies were reviewed today:   EKG:  EKG is  ordered today.  The ekg ordered today is personally reviewed and demonstrates  atrial fibrillation ventricular rate 100 210 bpm  Recent Labs: 02/23/2020: ALT 48; BUN 18; Creatinine, Ser 0.85; Hemoglobin 17.2; Platelets 243; Potassium 4.8; Sodium 139  Recent Lipid Panel    Component Value Date/Time   CHOL 200 (H) 02/23/2020 0845   TRIG 369 (H) 02/23/2020 0845   HDL 38 (L) 02/23/2020 0845   CHOLHDL 5.3 (H) 02/23/2020 0845   LDLCALC 100 (H) 02/23/2020 0845    Physical Exam:    VS:  BP 121/80   Pulse (!) 105   Ht 5\' 5"  (1.651 m)   Wt 233 lb 12.8 oz (106.1 kg)   SpO2 93%   BMI 38.91 kg/m     Wt Readings from Last 3 Encounters:  09/02/20 233 lb 12.8 oz (106.1 kg)  08/22/20 230 lb (104.3 kg)  08/12/20 230 lb (104.3 kg)     GEN:  Well nourished, well developed in no acute distress HEENT: Normal NECK: No JVD; No carotid bruits LYMPHATICS: No lymphadenopathy CARDIAC: Irregular rhythm S1 variable  no murmurs, rubs, gallops RESPIRATORY:  Clear to auscultation without rales, wheezing or rhonchi  ABDOMEN: Soft, non-tender, non-distended MUSCULOSKELETAL:  No edema; No deformity  SKIN: Warm and dry NEUROLOGIC:  Alert and oriented x 3 PSYCHIATRIC:  Normal affect     Signed, 08/14/20, MD  09/02/2020 11:46 AM    [facility]

## 2020-09-01 NOTE — Addendum Note (Signed)
Addended by: Brent Bulla on: 09/01/2020 05:34 PM   Modules accepted: Level of Service

## 2020-09-01 NOTE — Progress Notes (Signed)
EM code changed to [phone] since he is not medicare lp

## 2020-09-02 ENCOUNTER — Ambulatory Visit (INDEPENDENT_AMBULATORY_CARE_PROVIDER_SITE_OTHER): Payer: 59 | Admitting: Cardiology

## 2020-09-02 ENCOUNTER — Other Ambulatory Visit: Payer: Self-pay

## 2020-09-02 ENCOUNTER — Telehealth: Payer: Self-pay | Admitting: Cardiology

## 2020-09-02 ENCOUNTER — Encounter: Payer: Self-pay | Admitting: Cardiology

## 2020-09-02 ENCOUNTER — Other Ambulatory Visit: Payer: Self-pay | Admitting: Legal Medicine

## 2020-09-02 VITALS — BP 121/80 | HR 105 | Ht 65.0 in | Wt 233.8 lb

## 2020-09-02 DIAGNOSIS — I5020 Unspecified systolic (congestive) heart failure: Secondary | ICD-10-CM

## 2020-09-02 DIAGNOSIS — E1142 Type 2 diabetes mellitus with diabetic polyneuropathy: Secondary | ICD-10-CM

## 2020-09-02 DIAGNOSIS — K7460 Unspecified cirrhosis of liver: Secondary | ICD-10-CM | POA: Diagnosis not present

## 2020-09-02 DIAGNOSIS — I110 Hypertensive heart disease with heart failure: Secondary | ICD-10-CM | POA: Diagnosis not present

## 2020-09-02 DIAGNOSIS — Z794 Long term (current) use of insulin: Secondary | ICD-10-CM

## 2020-09-02 DIAGNOSIS — E782 Mixed hyperlipidemia: Secondary | ICD-10-CM

## 2020-09-02 DIAGNOSIS — I5022 Chronic systolic (congestive) heart failure: Secondary | ICD-10-CM | POA: Diagnosis not present

## 2020-09-02 DIAGNOSIS — I4891 Unspecified atrial fibrillation: Secondary | ICD-10-CM

## 2020-09-02 DIAGNOSIS — Z7901 Long term (current) use of anticoagulants: Secondary | ICD-10-CM

## 2020-09-02 DIAGNOSIS — I2510 Atherosclerotic heart disease of native coronary artery without angina pectoris: Secondary | ICD-10-CM

## 2020-09-02 DIAGNOSIS — I350 Nonrheumatic aortic (valve) stenosis: Secondary | ICD-10-CM

## 2020-09-02 DIAGNOSIS — Q231 Congenital insufficiency of aortic valve: Secondary | ICD-10-CM

## 2020-09-02 MED ORDER — ENTRESTO 49-51 MG PO TABS: 1.0000 | ORAL_TABLET | Freq: Two times a day (BID) | ORAL | 3 refills | Status: DC

## 2020-09-02 NOTE — Patient Instructions (Signed)
Medication Instructions:  Your physician has recommended you make the following change in your medication:  STOP: Losartan STOP: Amlodipine START: Entresto 49/51 take one tablet by mouth twice daily.  *If you need a refill on your cardiac medications before your next appointment, please call your pharmacy*   Lab Work: Your physician recommends that you return for lab work in: TODAY BMP, ProBNP If you have labs (blood work) drawn today and your tests are completely normal, you will receive your results only by: Marland Kitchen MyChart Message (if you have MyChart) OR . A paper copy in the mail If you have any lab test that is abnormal or we need to change your treatment, we will call you to review the results.   Testing/Procedures: None   Follow-Up: At [facility], you and your health needs are our priority.  As part of our continuing mission to provide you with exceptional heart care, we have created designated Provider Care Teams.  These Care Teams include your primary Cardiologist (physician) and Advanced Practice Providers (APPs -  Physician Assistants and Nurse Practitioners) who all work together to provide you with the care you need, when you need it.  We recommend signing up for the patient portal called "MyChart".  Sign up information is provided on this After Visit Summary.  MyChart is used to connect with patients for Virtual Visits (Telemedicine).  Patients are able to view lab/test results, encounter notes, upcoming appointments, etc.  Non-urgent messages can be sent to your provider as well.   To learn more about what you can do with MyChart, go to [email or web address].    Your next appointment:   2 week(s)  The format for your next appointment:   In Person  Provider:   Norman Herrlich, MD   Other Instructions

## 2020-09-02 NOTE — Telephone Encounter (Signed)
Left patient a detailed message letting him know that he should continue his medications until he starts on the [place]. I also let him know to please let us know if he needed any further help or advise. Our call back number was given.

## 2020-09-02 NOTE — Telephone Encounter (Signed)
   Pt c/o medication issue:  1. Name of Medication: losartan and amlodipine  2. How are you currently taking this medication (dosage and times per day)?   3. Are you having a reaction (difficulty breathing--STAT)?   4. What is your medication issue? Pt said he was told by Dr. Dulce Sellar to stop taking losartan and amlodipine once he start Entresto. He is having troubles getting the entresto because of insurance. He wanted to ask if he still need to continue taking losartan and amlodipine while waiting for his entresto

## 2020-09-03 ENCOUNTER — Other Ambulatory Visit: Payer: Self-pay | Admitting: Legal Medicine

## 2020-09-03 LAB — BASIC METABOLIC PANEL
BUN/Creatinine Ratio: 12 (ref 9–20)
BUN: 11 mg/dL (ref 6–24)
CO2: 28 mmol/L (ref 20–29)
Calcium: 9.7 mg/dL (ref 8.7–10.2)
Chloride: 101 mmol/L (ref 96–106)
Creatinine, Ser: 0.93 mg/dL (ref 0.76–1.27)
GFR calc Af Amer: 106 mL/min/{1.73_m2} (ref >59)
GFR calc non Af Amer: 92 mL/min/{1.73_m2} (ref >59)
Glucose: 80 mg/dL (ref 65–99)
Potassium: 3.8 mmol/L (ref 3.5–5.2)
Sodium: 143 mmol/L (ref 134–144)

## 2020-09-03 LAB — PRO B NATRIURETIC PEPTIDE: NT-Pro BNP: 1091 pg/mL — ABNORMAL HIGH (ref 0–210)

## 2020-09-09 ENCOUNTER — Telehealth: Payer: Self-pay

## 2020-09-09 NOTE — Telephone Encounter (Signed)
-----   Message from Baldo Daub, MD sent at 09/09/2020  4:36 PM EDT ----- Good result no changes

## 2020-09-09 NOTE — Telephone Encounter (Signed)
Spoke with patient regarding results and recommendation.  Patient verbalizes understanding and is agreeable to plan of care. Advised patient to call back with any issues or concerns.  

## 2020-09-10 ENCOUNTER — Telehealth: Payer: Self-pay | Admitting: Cardiology

## 2020-09-10 NOTE — Telephone Encounter (Signed)
Prior auth form was filled out and given to Dr. Dulce Sellar to sign for the patient.

## 2020-09-10 NOTE — Telephone Encounter (Signed)
Pt called in and stated that the [facility] that Dr Dulce Sellar wanted pt to go on is needing a PA .  [facility] is need a PA completed on this med.    Best number for [phone]

## 2020-09-11 ENCOUNTER — Other Ambulatory Visit: Payer: Self-pay | Admitting: Cardiology

## 2020-09-11 ENCOUNTER — Telehealth: Payer: Self-pay

## 2020-09-11 MED ORDER — ELIQUIS 5 MG PO TABS
5.0000 mg | ORAL_TABLET | Freq: Two times a day (BID) | ORAL | 3 refills | Status: DC
Start: 2020-09-11 — End: 2020-12-06

## 2020-09-11 NOTE — Telephone Encounter (Signed)
Refill sent in per request.  

## 2020-09-11 NOTE — Telephone Encounter (Signed)
Prior Authorization started.  Key #: [id number]

## 2020-09-11 NOTE — Telephone Encounter (Signed)
   *  STAT* If patient is at the pharmacy, call can be transferred to refill team.   1. Which medications need to be refilled? (please list name of each medication and dose if known)   ELIQUIS 5 MG TABS tablet    2. Which pharmacy/location (including street and city if local pharmacy) is medication to be sent to? [facility] [phone] - [place], [place] - [address] AT [address]  3. Do they need a 30 day or 90 day supply? 90 days

## 2020-09-14 NOTE — H&P (View-Only) (Signed)
Cardiology Office Note:    Date:  09/16/2020   ID:  [address] [name], [place] 1966-03-31, MRN [id number]  PCP:  Abigail Miyamoto, MD  Cardiologist:  Norman Herrlich, MD    Referring MD: Abigail Miyamoto,*    ASSESSMENT:    1. Persistent atrial fibrillation (HCC)   2. HFrEF (heart failure with reduced ejection fraction) (HCC)   3. Hypertensive heart disease with chronic systolic congestive heart failure (HCC)   4. Chronic anticoagulation   5. Cirrhosis of liver without ascites, unspecified hepatic cirrhosis type (HCC)    PLAN:    In order of problems listed above:  1. He remains in atrial fibrillation rate controlled effectively anticoagulated will refer for an attempted cardioversion.  If effective initiate amiodarone therapy if ineffective referral to EP. 2. Improved he remains short of breath we will increase his diuretic and will also initiate ARB therapy as his prescription benefits we will not give him Entresto even with the diagnosis of heart failure reduced ejection fraction 3. Continue guideline directed therapy initiate ARB 4. Continue anticoagulation however long-term may be best served with watchman device with his underlying cirrhosis   Next appointment: 4 weeks with me   Medication Adjustments/Labs and Tests Ordered: Current medicines are reviewed at length with the patient today.  Concerns regarding medicines are outlined above.  Orders Placed This Encounter  Procedures  . Basic metabolic panel  . CBC  . Pro b natriuretic peptide  . EKG 12-Lead   Meds ordered this encounter  Medications  . furosemide (LASIX) 20 MG tablet    Sig: Take 1 tablet (20 mg total) by mouth 2 (two) times daily.    Dispense:  180 tablet    Refill:  3  . valsartan (DIOVAN) 40 MG tablet    Sig: Take 1 tablet (40 mg total) by mouth 2 (two) times daily.    Dispense:  180 tablet    Refill:  3    No chief complaint on file.   History of Present Illness:    Marcus Hicks is a 54 y.o. male with a hx of persistent atrial fibrillation with tachycardia induced cardiomyopathy, hypertensive heart disease hepatic cirrhosis bicuspid aortic valve with mild aortic stenosis mild nonobstructive CAD hyperlipidemia and type 2 diabetes last seen 09/02/2020.  At that visit he was transition from ARB to [facility] and we planned after finishing 30 days of his anticoagulant to attempt cardioversion to sinus rhythm.  With his underlying liver disease and increased bleeding risk I asked him to give consideration to watchman device.  His most recent echocardiogram 08/12/2020 shows EF 35 to 40% moderate aortic regurgitation.  Cardiac CTA 08/25/2019 shows mild less than 25% LAD stenosis.  Compliance with diet, lifestyle and medications: Yes  He feels better however he still is short of breath when he tries to do more than usual activities.  He has been compliant with his anticoagulant.  I reviewed with him that I think the mechanism of cardiomyopathy is atrial fibrillation options of treatment direct referral to EP or an attempted cardioversion and he agrees that we will do cardioversion as an outpatient.  If effective then I would put him on amiodarone initially 400 mg a day to maintain sinus rhythm and if ineffective refer to EP for consideration of catheter ablation.  No edema orthopnea chest pain palpitation or syncope no bleeding complication of his anticoagulant.  His benefit manager would not give him a prescription for Entresto. Past Medical History:  Diagnosis  Date  . Atrial fibrillation with rapid ventricular response (HCC) 08/12/2020  . Bicuspid aortic valve   . Chronic hepatitis C virus infection (HCC) 08/03/2013  . COPD (chronic obstructive pulmonary disease) (HCC)   . Diabetes mellitus type 2, insulin dependent (HCC)   . Essential hypertension 07/03/2015   Last Assessment & Plan:  Blood pressure is borderline high.  Advised to get better sleep, walk on a regular basis,  decrease weight down to about 210 and maintain, and eliminate tobacco.  . HFrEF (heart failure with reduced ejection fraction) (HCC) 08/22/2020  . Long-term insulin use (HCC) 10/12/2016  . Obesity, unspecified 10/12/2016   Patient advised to lose down to about 210 pounds and maintained.  Regular walking program recommended.  He knows how to lose weight.    Past Surgical History:  Procedure Laterality Date  . CCTA  08/2019    Current Medications: Current Meds  Medication Sig  . albuterol (PROVENTIL) (2.5 MG/3ML) 0.083% nebulizer solution Take 3 mLs (2.5 mg total) by nebulization every 6 (six) hours as needed for wheezing or shortness of breath.  Marland Kitchen albuterol (VENTOLIN HFA) 108 (90 Base) MCG/ACT inhaler Inhale into the lungs every 6 (six) hours as needed for wheezing or shortness of breath.  . ALPRAZolam (XANAX) 0.5 MG tablet Take 0.5 mg by mouth 3 (three) times daily as needed.  . BD PEN NEEDLE NANO 2ND GEN 32G X 4 MM MISC daily. use as directed  . ELIQUIS 5 MG TABS tablet Take 1 tablet (5 mg total) by mouth 2 (two) times daily.  . Gabapentin, Once-Daily, (GRALISE) 600 MG TABS Take by mouth.  Marland Kitchen glucose blood (CONTOUR NEXT TEST) test strip Use to check blood sugar twice daily. Dx: E11.42  . meloxicam (MOBIC) 15 MG tablet TAKE 1 TABLET(15 MG) BY MOUTH EVERY DAY  . metFORMIN (GLUCOPHAGE) 500 MG tablet TAKE 1 TABLET BY MOUTH TWICE DAILY  . metoprolol succinate (TOPROL-XL) 100 MG 24 hr tablet Take 100 mg by mouth daily.   . Microlet Lancets MISC USE TO CHECK BLOOD SUGAR EVERY MORNING AND AFTER MEALS  . Omega-3 Fatty Acids (FISH OIL PEARLS) 183.33 MG CAPS every morning.  . traMADol (ULTRAM) 50 MG tablet Take 100 mg by mouth 4 (four) times daily.  . TRESIBA FLEXTOUCH 100 UNIT/ML SOPN FlexTouch Pen Inject 0.6 mLs (60 Units total) into the skin daily.  Marland Kitchen umeclidinium-vilanterol (ANORO ELLIPTA) 62.5-25 MCG/INH AEPB Inhale 1 puff into the lungs daily.  Marland Kitchen zolpidem (AMBIEN) 10 MG tablet TAKE 1  TABLET(10 MG) BY MOUTH EVERY DAY AT BEDTIME  . [DISCONTINUED] furosemide (LASIX) 20 MG tablet Take 1 tablet (20 mg total) by mouth daily.  . [DISCONTINUED] sacubitril-valsartan (ENTRESTO) 49-51 MG Take 1 tablet by mouth 2 (two) times daily.     Allergies:   Patient has no known allergies.   Social History   Socioeconomic History  . Marital status: Married    Spouse name: Not on file  . Number of children: Not on file  . Years of education: Not on file  . Highest education level: Not on file  Occupational History  . Not on file  Tobacco Use  . Smoking status: Former Smoker    Quit date: 2007    Years since quitting: 14.8  . Smokeless tobacco: Former Neurosurgeon    Quit date: 2015  Advertising account planner  . Vaping Use: Never used  Substance and Sexual Activity  . Alcohol use: Not Currently  . Drug use: Never  . Sexual activity: Not  on file  Other Topics Concern  . Not on file  Social History Narrative  . Not on file   Social Determinants of Health   Financial Resource Strain:   . Difficulty of Paying Living Expenses: Not on file  Food Insecurity:   . Worried About Programme researcher, broadcasting/film/video in the Last Year: Not on file  . Ran Out of Food in the Last Year: Not on file  Transportation Needs:   . Lack of Transportation (Medical): Not on file  . Lack of Transportation (Non-Medical): Not on file  Physical Activity:   . Days of Exercise per Week: Not on file  . Minutes of Exercise per Session: Not on file  Stress:   . Feeling of Stress : Not on file  Social Connections:   . Frequency of Communication with Friends and Family: Not on file  . Frequency of Social Gatherings with Friends and Family: Not on file  . Attends Religious Services: Not on file  . Active Member of Clubs or Organizations: Not on file  . Attends Banker Meetings: Not on file  . Marital Status: Not on file     Family History: The patient's family history includes Alzheimer's disease in an other family member;  Cancer in his maternal grandfather; Hypertension in an other family member; Liver disease in his sister; Memory loss in an other family member. ROS:   Please see the history of present illness.    All other systems reviewed and are negative.  EKGs/Labs/Other Studies Reviewed:    The following studies were reviewed today:  EKG:  EKG ordered today and personally reviewed.  The ekg ordered today demonstrates confirms continued atrial fibrillation rate 97 bpm incomplete right bundle branch block  Recent Labs: 02/23/2020: ALT 48; Hemoglobin 17.2; Platelets 243 09/02/2020: BUN 11; Creatinine, Ser 0.93; NT-Pro BNP 1,091; Potassium 3.8; Sodium 143  Recent Lipid Panel    Component Value Date/Time   CHOL 200 (H) 02/23/2020 0845   TRIG 369 (H) 02/23/2020 0845   HDL 38 (L) 02/23/2020 0845   CHOLHDL 5.3 (H) 02/23/2020 0845   LDLCALC 100 (H) 02/23/2020 0845    Physical Exam:    VS:  BP 122/80 (BP Location: Right Arm, Patient Position: Sitting, Cuff Size: Normal)   Pulse 83   Ht 5\' 5"  (1.651 m)   Wt 225 lb (102.1 kg)   SpO2 96%   BMI 37.44 kg/m     Wt Readings from Last 3 Encounters:  09/16/20 225 lb (102.1 kg)  09/02/20 233 lb 12.8 oz (106.1 kg)  08/22/20 230 lb (104.3 kg)     GEN:  Well nourished, well developed in no acute distress HEENT: Normal NECK: No JVD; No carotid bruits LYMPHATICS: No lymphadenopathy CARDIAC: Irregular S1 is variable  no murmurs, rubs, gallops RESPIRATORY:  Clear to auscultation without rales, wheezing or rhonchi  ABDOMEN: Soft, non-tender, non-distended MUSCULOSKELETAL:  No edema; No deformity  SKIN: Warm and dry NEUROLOGIC:  Alert and oriented x 3 PSYCHIATRIC:  Normal affect    Signed, 08/24/20, MD  09/16/2020 11:41 AM    [facility]

## 2020-09-14 NOTE — Progress Notes (Signed)
Cardiology Office Note:    Date:  09/16/2020   ID:  [address] Marcus Hicks, [place] 1966-03-31, MRN [id number]  PCP:  Abigail Miyamoto, MD  Cardiologist:  Norman Herrlich, MD    Referring MD: Abigail Miyamoto,*    ASSESSMENT:    1. Persistent atrial fibrillation (HCC)   2. HFrEF (heart failure with reduced ejection fraction) (HCC)   3. Hypertensive heart disease with chronic systolic congestive heart failure (HCC)   4. Chronic anticoagulation   5. Cirrhosis of liver without ascites, unspecified hepatic cirrhosis type (HCC)    PLAN:    In order of problems listed above:  1. He remains in atrial fibrillation rate controlled effectively anticoagulated will refer for an attempted cardioversion.  If effective initiate amiodarone therapy if ineffective referral to EP. 2. Improved he remains short of breath we will increase his diuretic and will also initiate ARB therapy as his prescription benefits we will not give him Entresto even with the diagnosis of heart failure reduced ejection fraction 3. Continue guideline directed therapy initiate ARB 4. Continue anticoagulation however long-term may be best served with watchman device with his underlying cirrhosis   Next appointment: 4 weeks with me   Medication Adjustments/Labs and Tests Ordered: Current medicines are reviewed at length with the patient today.  Concerns regarding medicines are outlined above.  Orders Placed This Encounter  Procedures  . Basic metabolic panel  . CBC  . Pro b natriuretic peptide  . EKG 12-Lead   Meds ordered this encounter  Medications  . furosemide (LASIX) 20 MG tablet    Sig: Take 1 tablet (20 mg total) by mouth 2 (two) times daily.    Dispense:  180 tablet    Refill:  3  . valsartan (DIOVAN) 40 MG tablet    Sig: Take 1 tablet (40 mg total) by mouth 2 (two) times daily.    Dispense:  180 tablet    Refill:  3    No chief complaint on file.   History of Present Illness:    Marcus Hicks is a 54 y.o. male with a hx of persistent atrial fibrillation with tachycardia induced cardiomyopathy, hypertensive heart disease hepatic cirrhosis bicuspid aortic valve with mild aortic stenosis mild nonobstructive CAD hyperlipidemia and type 2 diabetes last seen 09/02/2020.  At that visit he was transition from ARB to [facility] and we planned after finishing 30 days of his anticoagulant to attempt cardioversion to sinus rhythm.  With his underlying liver disease and increased bleeding risk I asked him to give consideration to watchman device.  His most recent echocardiogram 08/12/2020 shows EF 35 to 40% moderate aortic regurgitation.  Cardiac CTA 08/25/2019 shows mild less than 25% LAD stenosis.  Compliance with diet, lifestyle and medications: Yes  He feels better however he still is short of breath when he tries to do more than usual activities.  He has been compliant with his anticoagulant.  I reviewed with him that I think the mechanism of cardiomyopathy is atrial fibrillation options of treatment direct referral to EP or an attempted cardioversion and he agrees that we will do cardioversion as an outpatient.  If effective then I would put him on amiodarone initially 400 mg a day to maintain sinus rhythm and if ineffective refer to EP for consideration of catheter ablation.  No edema orthopnea chest pain palpitation or syncope no bleeding complication of his anticoagulant.  His benefit manager would not give him a prescription for Entresto. Past Medical History:  Diagnosis  Date  . Atrial fibrillation with rapid ventricular response (HCC) 08/12/2020  . Bicuspid aortic valve   . Chronic hepatitis C virus infection (HCC) 08/03/2013  . COPD (chronic obstructive pulmonary disease) (HCC)   . Diabetes mellitus type 2, insulin dependent (HCC)   . Essential hypertension 07/03/2015   Last Assessment & Plan:  Blood pressure is borderline high.  Advised to get better sleep, walk on a regular basis,  decrease weight down to about 210 and maintain, and eliminate tobacco.  . HFrEF (heart failure with reduced ejection fraction) (HCC) 08/22/2020  . Long-term insulin use (HCC) 10/12/2016  . Obesity, unspecified 10/12/2016   Patient advised to lose down to about 210 pounds and maintained.  Regular walking program recommended.  He knows how to lose weight.    Past Surgical History:  Procedure Laterality Date  . CCTA  08/2019    Current Medications: Current Meds  Medication Sig  . albuterol (PROVENTIL) (2.5 MG/3ML) 0.083% nebulizer solution Take 3 mLs (2.5 mg total) by nebulization every 6 (six) hours as needed for wheezing or shortness of breath.  Marland Kitchen albuterol (VENTOLIN HFA) 108 (90 Base) MCG/ACT inhaler Inhale into the lungs every 6 (six) hours as needed for wheezing or shortness of breath.  . ALPRAZolam (XANAX) 0.5 MG tablet Take 0.5 mg by mouth 3 (three) times daily as needed.  . BD PEN NEEDLE NANO 2ND GEN 32G X 4 MM MISC daily. use as directed  . ELIQUIS 5 MG TABS tablet Take 1 tablet (5 mg total) by mouth 2 (two) times daily.  . Gabapentin, Once-Daily, (GRALISE) 600 MG TABS Take by mouth.  Marland Kitchen glucose blood (CONTOUR NEXT TEST) test strip Use to check blood sugar twice daily. Dx: E11.42  . meloxicam (MOBIC) 15 MG tablet TAKE 1 TABLET(15 MG) BY MOUTH EVERY DAY  . metFORMIN (GLUCOPHAGE) 500 MG tablet TAKE 1 TABLET BY MOUTH TWICE DAILY  . metoprolol succinate (TOPROL-XL) 100 MG 24 hr tablet Take 100 mg by mouth daily.   . Microlet Lancets MISC USE TO CHECK BLOOD SUGAR EVERY MORNING AND AFTER MEALS  . Omega-3 Fatty Acids (FISH OIL PEARLS) 183.33 MG CAPS every morning.  . traMADol (ULTRAM) 50 MG tablet Take 100 mg by mouth 4 (four) times daily.  . TRESIBA FLEXTOUCH 100 UNIT/ML SOPN FlexTouch Pen Inject 0.6 mLs (60 Units total) into the skin daily.  Marland Kitchen umeclidinium-vilanterol (ANORO ELLIPTA) 62.5-25 MCG/INH AEPB Inhale 1 puff into the lungs daily.  Marland Kitchen zolpidem (AMBIEN) 10 MG tablet TAKE 1  TABLET(10 MG) BY MOUTH EVERY DAY AT BEDTIME  . [DISCONTINUED] furosemide (LASIX) 20 MG tablet Take 1 tablet (20 mg total) by mouth daily.  . [DISCONTINUED] sacubitril-valsartan (ENTRESTO) 49-51 MG Take 1 tablet by mouth 2 (two) times daily.     Allergies:   Patient has no known allergies.   Social History   Socioeconomic History  . Marital status: Married    Spouse name: Not on file  . Number of children: Not on file  . Years of education: Not on file  . Highest education level: Not on file  Occupational History  . Not on file  Tobacco Use  . Smoking status: Former Smoker    Quit date: 2007    Years since quitting: 14.8  . Smokeless tobacco: Former Neurosurgeon    Quit date: 2015  Advertising account planner  . Vaping Use: Never used  Substance and Sexual Activity  . Alcohol use: Not Currently  . Drug use: Never  . Sexual activity: Not  on file  Other Topics Concern  . Not on file  Social History Narrative  . Not on file   Social Determinants of Health   Financial Resource Strain:   . Difficulty of Paying Living Expenses: Not on file  Food Insecurity:   . Worried About Programme researcher, broadcasting/film/video in the Last Year: Not on file  . Ran Out of Food in the Last Year: Not on file  Transportation Needs:   . Lack of Transportation (Medical): Not on file  . Lack of Transportation (Non-Medical): Not on file  Physical Activity:   . Days of Exercise per Week: Not on file  . Minutes of Exercise per Session: Not on file  Stress:   . Feeling of Stress : Not on file  Social Connections:   . Frequency of Communication with Friends and Family: Not on file  . Frequency of Social Gatherings with Friends and Family: Not on file  . Attends Religious Services: Not on file  . Active Member of Clubs or Organizations: Not on file  . Attends Banker Meetings: Not on file  . Marital Status: Not on file     Family History: The patient's family history includes Alzheimer's disease in an other family member;  Cancer in his maternal grandfather; Hypertension in an other family member; Liver disease in his sister; Memory loss in an other family member. ROS:   Please see the history of present illness.    All other systems reviewed and are negative.  EKGs/Labs/Other Studies Reviewed:    The following studies were reviewed today:  EKG:  EKG ordered today and personally reviewed.  The ekg ordered today demonstrates confirms continued atrial fibrillation rate 97 bpm incomplete right bundle branch block  Recent Labs: 02/23/2020: ALT 48; Hemoglobin 17.2; Platelets 243 09/02/2020: BUN 11; Creatinine, Ser 0.93; NT-Pro BNP 1,091; Potassium 3.8; Sodium 143  Recent Lipid Panel    Component Value Date/Time   CHOL 200 (H) 02/23/2020 0845   TRIG 369 (H) 02/23/2020 0845   HDL 38 (L) 02/23/2020 0845   CHOLHDL 5.3 (H) 02/23/2020 0845   LDLCALC 100 (H) 02/23/2020 0845    Physical Exam:    VS:  BP 122/80 (BP Location: Right Arm, Patient Position: Sitting, Cuff Size: Normal)   Pulse 83   Ht 5\' 5"  (1.651 m)   Wt 225 lb (102.1 kg)   SpO2 96%   BMI 37.44 kg/m     Wt Readings from Last 3 Encounters:  09/16/20 225 lb (102.1 kg)  09/02/20 233 lb 12.8 oz (106.1 kg)  08/22/20 230 lb (104.3 kg)     GEN:  Well nourished, well developed in no acute distress HEENT: Normal NECK: No JVD; No carotid bruits LYMPHATICS: No lymphadenopathy CARDIAC: Irregular S1 is variable  no murmurs, rubs, gallops RESPIRATORY:  Clear to auscultation without rales, wheezing or rhonchi  ABDOMEN: Soft, non-tender, non-distended MUSCULOSKELETAL:  No edema; No deformity  SKIN: Warm and dry NEUROLOGIC:  Alert and oriented x 3 PSYCHIATRIC:  Normal affect    Signed, 08/24/20, MD  09/16/2020 11:41 AM    [facility]

## 2020-09-16 ENCOUNTER — Other Ambulatory Visit: Payer: Self-pay

## 2020-09-16 ENCOUNTER — Ambulatory Visit (INDEPENDENT_AMBULATORY_CARE_PROVIDER_SITE_OTHER): Payer: 59 | Admitting: Cardiology

## 2020-09-16 ENCOUNTER — Encounter: Payer: Self-pay | Admitting: Cardiology

## 2020-09-16 VITALS — BP 122/80 | HR 83 | Ht 65.0 in | Wt 225.0 lb

## 2020-09-16 DIAGNOSIS — Z23 Encounter for immunization: Secondary | ICD-10-CM | POA: Diagnosis not present

## 2020-09-16 DIAGNOSIS — I5022 Chronic systolic (congestive) heart failure: Secondary | ICD-10-CM

## 2020-09-16 DIAGNOSIS — K7460 Unspecified cirrhosis of liver: Secondary | ICD-10-CM

## 2020-09-16 DIAGNOSIS — I110 Hypertensive heart disease with heart failure: Secondary | ICD-10-CM

## 2020-09-16 DIAGNOSIS — I5020 Unspecified systolic (congestive) heart failure: Secondary | ICD-10-CM | POA: Diagnosis not present

## 2020-09-16 DIAGNOSIS — Z7901 Long term (current) use of anticoagulants: Secondary | ICD-10-CM

## 2020-09-16 DIAGNOSIS — I4819 Other persistent atrial fibrillation: Secondary | ICD-10-CM

## 2020-09-16 MED ORDER — VALSARTAN 40 MG PO TABS: 40.0000 mg | ORAL_TABLET | Freq: Two times a day (BID) | ORAL | 3 refills | Status: DC

## 2020-09-16 MED ORDER — FUROSEMIDE 20 MG PO TABS: 20.0000 mg | ORAL_TABLET | Freq: Two times a day (BID) | ORAL | 3 refills | Status: DC

## 2020-09-16 NOTE — Patient Instructions (Signed)
Medication Instructions:  Your physician has recommended you make the following change in your medication:  INCREASE: Furosemide 20 mg take one tablet by mouth twice daily.  START: Valsartan 40 mg take one tablet by mouth twice daily.  *If you need a refill on your cardiac medications before your next appointment, please call your pharmacy*   Lab Work: Your physician recommends that you return for lab work in: TODAY CMP, CBC, ProBNP If you have labs (blood work) drawn today and your tests are completely normal, you will receive your results only by: Marland Kitchen MyChart Message (if you have MyChart) OR . A paper copy in the mail If you have any lab test that is abnormal or we need to change your treatment, we will call you to review the results.   Testing/Procedures: You are scheduled for a Cardioversion on 09/24/20 with Dr. Bjorn Pippin.  Please arrive at the [facility] (Main Entrance A) at [facility]: [address] [place], [place] [address] at 7:30 am.   DIET: Nothing to eat or drink after midnight except a sip of water with medications (see medication instructions below)  Medication Instructions: Hold: Metformin the morning of the test.    Continue your anticoagulant: Eliquis You will need to continue your anticoagulant after your procedure until you are told by your  Provider that it is safe to stop  You must have a responsible person to drive you home and stay in the waiting area during your procedure. Failure to do so could result in cancellation.  Bring your insurance cards.  *Special Note: Every effort is made to have your procedure done on time. Occasionally there are emergencies that occur at the hospital that may cause delays. Please be patient if a delay does occur.     Follow-Up: At [facility], you and your health needs are our priority.  As part of our continuing mission to provide you with exceptional heart care, we have created designated Provider Care Teams.   These Care Teams include your primary Cardiologist (physician) and Advanced Practice Providers (APPs -  Physician Assistants and Nurse Practitioners) who all work together to provide you with the care you need, when you need it.  We recommend signing up for the patient portal called "MyChart".  Sign up information is provided on this After Visit Summary.  MyChart is used to connect with patients for Virtual Visits (Telemedicine).  Patients are able to view lab/test results, encounter notes, upcoming appointments, etc.  Non-urgent messages can be sent to your provider as well.   To learn more about what you can do with MyChart, go to [email or web address].    Your next appointment:   4 week(s)  The format for your next appointment:   In Person  Provider:   Norman Herrlich, MD   Other Instructions

## 2020-09-17 LAB — BASIC METABOLIC PANEL
BUN/Creatinine Ratio: 13 (ref 9–20)
BUN: 13 mg/dL (ref 6–24)
CO2: 30 mmol/L — ABNORMAL HIGH (ref 20–29)
Calcium: 9.8 mg/dL (ref 8.7–10.2)
Chloride: 98 mmol/L (ref 96–106)
Creatinine, Ser: 1.01 mg/dL (ref 0.76–1.27)
GFR calc Af Amer: 96 mL/min/{1.73_m2} (ref >59)
GFR calc non Af Amer: 83 mL/min/{1.73_m2} (ref >59)
Glucose: 100 mg/dL — ABNORMAL HIGH (ref 65–99)
Potassium: 4 mmol/L (ref 3.5–5.2)
Sodium: 143 mmol/L (ref 134–144)

## 2020-09-17 LAB — CBC
Hematocrit: 50.6 % (ref 37.5–51.0)
Hemoglobin: 16.8 g/dL (ref 13.0–17.7)
MCH: 29.6 pg (ref 26.6–33.0)
MCHC: 33.2 g/dL (ref 31.5–35.7)
MCV: 89 fL (ref 79–97)
Platelets: 214 10*3/uL (ref 150–450)
RBC: 5.67 x10E6/uL (ref 4.14–5.80)
RDW: 12 % (ref 11.6–15.4)
WBC: 7.5 10*3/uL (ref 3.4–10.8)

## 2020-09-17 LAB — PRO B NATRIURETIC PEPTIDE: NT-Pro BNP: 762 pg/mL — ABNORMAL HIGH (ref 0–210)

## 2020-09-18 ENCOUNTER — Ambulatory Visit: Payer: BC Managed Care – PPO | Admitting: Cardiology

## 2020-09-21 ENCOUNTER — Other Ambulatory Visit (HOSPITAL_COMMUNITY)
Admission: RE | Admit: 2020-09-21 | Discharge: 2020-09-21 | Disposition: A | Discharge status: Home / Self Care | Payer: 59 | Source: Ambulatory Visit | Attending: Cardiology | Admitting: Cardiology

## 2020-09-21 DIAGNOSIS — Z01812 Encounter for preprocedural laboratory examination: Secondary | ICD-10-CM | POA: Diagnosis present

## 2020-09-21 DIAGNOSIS — Z20822 Contact with and (suspected) exposure to covid-19: Secondary | ICD-10-CM | POA: Insufficient documentation

## 2020-09-21 LAB — SARS CORONAVIRUS 2 (TAT 6-24 HRS): SARS Coronavirus 2: NEGATIVE

## 2020-09-23 ENCOUNTER — Other Ambulatory Visit: Payer: Self-pay | Admitting: Legal Medicine

## 2020-09-23 DIAGNOSIS — E782 Mixed hyperlipidemia: Secondary | ICD-10-CM

## 2020-09-24 ENCOUNTER — Encounter (HOSPITAL_COMMUNITY)
Admission: RE | Disposition: A | Discharge status: Home / Self Care | Payer: Self-pay | Source: Home / Self Care | Attending: Cardiology

## 2020-09-24 ENCOUNTER — Ambulatory Visit (HOSPITAL_COMMUNITY)
Admission: RE | Admit: 2020-09-24 | Discharge: 2020-09-24 | Disposition: A | Discharge status: Home / Self Care | Payer: 59 | Attending: Cardiology | Admitting: Cardiology

## 2020-09-24 ENCOUNTER — Ambulatory Visit (HOSPITAL_COMMUNITY): Payer: 59 | Admitting: Anesthesiology

## 2020-09-24 ENCOUNTER — Ambulatory Visit (HOSPITAL_BASED_OUTPATIENT_CLINIC_OR_DEPARTMENT_OTHER): Payer: 59

## 2020-09-24 ENCOUNTER — Encounter (HOSPITAL_COMMUNITY): Payer: Self-pay | Admitting: Cardiology

## 2020-09-24 ENCOUNTER — Other Ambulatory Visit: Payer: Self-pay

## 2020-09-24 DIAGNOSIS — I4891 Unspecified atrial fibrillation: Secondary | ICD-10-CM

## 2020-09-24 DIAGNOSIS — I5022 Chronic systolic (congestive) heart failure: Secondary | ICD-10-CM | POA: Diagnosis not present

## 2020-09-24 DIAGNOSIS — I4819 Other persistent atrial fibrillation: Secondary | ICD-10-CM | POA: Diagnosis not present

## 2020-09-24 DIAGNOSIS — Z7901 Long term (current) use of anticoagulants: Secondary | ICD-10-CM | POA: Insufficient documentation

## 2020-09-24 DIAGNOSIS — I110 Hypertensive heart disease with heart failure: Secondary | ICD-10-CM | POA: Insufficient documentation

## 2020-09-24 DIAGNOSIS — Z87891 Personal history of nicotine dependence: Secondary | ICD-10-CM | POA: Diagnosis not present

## 2020-09-24 DIAGNOSIS — K7460 Unspecified cirrhosis of liver: Secondary | ICD-10-CM | POA: Diagnosis not present

## 2020-09-24 DIAGNOSIS — I361 Nonrheumatic tricuspid (valve) insufficiency: Secondary | ICD-10-CM

## 2020-09-24 DIAGNOSIS — I351 Nonrheumatic aortic (valve) insufficiency: Secondary | ICD-10-CM

## 2020-09-24 HISTORY — PX: TEE WITHOUT CARDIOVERSION: SHX5443

## 2020-09-24 HISTORY — DX: Sleep apnea, unspecified: G47.30

## 2020-09-24 HISTORY — PX: CARDIOVERSION: SHX1299

## 2020-09-24 LAB — GLUCOSE, CAPILLARY: Glucose-Capillary: 97 mg/dL (ref 70–99)

## 2020-09-24 SURGERY — CARDIOVERSION
Anesthesia: General

## 2020-09-24 MED ORDER — PROPOFOL 10 MG/ML IV BOLUS
INTRAVENOUS | Status: DC | PRN
  Administered 2020-09-24: 20 mg via INTRAVENOUS
  Administered 2020-09-24: 50 mg via INTRAVENOUS
  Administered 2020-09-24: 30 mg via INTRAVENOUS
  Administered 2020-09-24 (×2): 20 mg via INTRAVENOUS

## 2020-09-24 MED ORDER — SODIUM CHLORIDE 0.9 % IV SOLN: INTRAVENOUS | Status: DC | PRN

## 2020-09-24 MED ORDER — LIDOCAINE 2% (20 MG/ML) 5 ML SYRINGE
INTRAMUSCULAR | Status: DC | PRN
  Administered 2020-09-24: 100 mg via INTRAVENOUS

## 2020-09-24 MED ORDER — BUTAMBEN-TETRACAINE-BENZOCAINE 2-2-14 % EX AERO
INHALATION_SPRAY | CUTANEOUS | Status: DC | PRN
  Administered 2020-09-24: 2 via TOPICAL

## 2020-09-24 MED ORDER — PROPOFOL 500 MG/50ML IV EMUL
INTRAVENOUS | Status: DC | PRN
  Administered 2020-09-24: 100 ug/kg/min via INTRAVENOUS

## 2020-09-24 NOTE — Progress Notes (Signed)
  Echocardiogram Echocardiogram Transesophageal has been performed.  Stark Bray Swaim 09/24/2020, 9:21 AM

## 2020-09-24 NOTE — CV Procedure (Signed)
° °  TRANSESOPHAGEAL ECHOCARDIOGRAM GUIDED DIRECT CURRENT CARDIOVERSION  NAME:  Marcus Hicks   MRN: [id number] DOB:  22-Nov-1966   ADMIT DATE: 09/24/2020  INDICATIONS: Symptomatic atrial fibrillation.  DCCV planned, but patient reported missing dose of Eliquis 3 days ago, so procedure changed to TEE/DCCV.  PROCEDURE:   Informed consent was obtained prior to the procedure. The risks, benefits and alternatives for the procedure were discussed and the patient comprehended these risks.  Risks include, but are not limited to, cough, sore throat, vomiting, nausea, somnolence, esophageal and stomach trauma or perforation, bleeding, low blood pressure, aspiration, pneumonia, infection, trauma to the teeth and death.    After a procedural time-out, the oropharynx was anesthetized and the patient was sedated by the anesthesia service. The transesophageal probe was inserted in the esophagus and stomach without difficulty and multiple views were obtained. Anesthesia was monitored by Oswaldo Milian, CRNA  COMPLICATIONS:    Complications: No complications Patient tolerated procedure well.  FINDINGS: No LAA thrombus seen   CARDIOVERSION:     Indications:  Symptomatic Atrial Fibrillation  Procedure Details:  Once the TEE was complete, the patient had the defibrillator pads placed in the anterior and posterior position. Once an appropriate level of sedation was confirmed, the patient was cardioverted x 3 with 200J of biphasic synchronized energy.  Cardioversion was not successful.  Following first shock, remained in Afib.  For second shock, pressure was applied to anterior pad but remained in Afib following shock.  For third shock, anterior pad was repositioned and pressure applied.  Appeared to have few beats of sinus rhythm following shock but then was back in Afib.  There were no apparent complications.  The patient had normal neuro status and respiratory status post procedure with vitals stable as  recorded elsewhere.  Adequate airway was maintained throughout and vital signs monitored per protocol.  Epifanio Lesches MD [facility]  [address] [place], [place] [address] [phone]   9:23 AM

## 2020-09-24 NOTE — Anesthesia Postprocedure Evaluation (Signed)
Anesthesia Post Note  Patient: Marcus Hicks  Procedure(s) Performed: CARDIOVERSION (N/A ) TRANSESOPHAGEAL ECHOCARDIOGRAM (TEE)     Patient location during evaluation: PACU Anesthesia Type: MAC Level of consciousness: awake and alert Pain management: pain level controlled Vital Signs Assessment: post-procedure vital signs reviewed and stable Respiratory status: spontaneous breathing, nonlabored ventilation, respiratory function stable and patient connected to nasal cannula oxygen Cardiovascular status: stable and blood pressure returned to baseline Postop Assessment: no apparent nausea or vomiting Anesthetic complications: no   No complications documented.  Last Vitals:  Vitals:   09/24/20 0931 09/24/20 0941  BP: 103/66 (!) 111/93  Pulse: 65 95  Resp: 13 (!) 21  Temp:    SpO2: 95% 95%    Last Pain:  Vitals:   09/24/20 0941  TempSrc:   PainSc: 0-No pain                 March Rummage Jamieson Hetland

## 2020-09-24 NOTE — Anesthesia Preprocedure Evaluation (Addendum)
Anesthesia Evaluation  Patient identified by MRN, date of birth, ID band Patient awake    Reviewed: Patient's Chart, lab work & pertinent test results  Airway Mallampati: II  TM Distance: >3 FB Neck ROM: Full    Dental  (+) Teeth Intact, Caps   Pulmonary COPD, former smoker,    Pulmonary exam normal        Cardiovascular hypertension, Pt. on medications +CHF  + dysrhythmias Atrial Fibrillation + Valvular Problems/Murmurs  Rhythm:Irregular Rate:Normal  Bicuspid AV   Neuro/Psych negative psych ROS   GI/Hepatic (+) Hepatitis -, C  Endo/Other  diabetes, Well Controlled, Type 2, Insulin Dependent  Renal/GU   negative genitourinary   Musculoskeletal negative musculoskeletal ROS (+)   Abdominal Normal abdominal exam  (+) + obese,  Abdomen: soft. Bowel sounds: normal.  Peds  Hematology negative hematology ROS (+)   Anesthesia Other Findings   Reproductive/Obstetrics                           Anesthesia Physical Anesthesia Plan  ASA: III  Anesthesia Plan: MAC   Post-op Pain Management:    Induction: Intravenous  PONV Risk Score and Plan: 2 and Treatment may vary due to age or medical condition, Ondansetron and Dexamethasone  Airway Management Planned: Simple Face Mask and Nasal Cannula  Additional Equipment: None  Intra-op Plan:   Post-operative Plan:   Informed Consent: I have reviewed the patients History and Physical, chart, labs and discussed the procedure including the risks, benefits and alternatives for the proposed anesthesia with the patient or authorized representative who has indicated his/her understanding and acceptance.     Dental advisory given  Plan Discussed with: CRNA  Anesthesia Plan Comments: (Lab Results      Component                Value               Date                      WBC                      7.5                 09/16/2020                HGB                       16.8                09/16/2020                HCT                      50.6                09/16/2020                MCV                      89                  09/16/2020                PLT  214                 09/16/2020           Lab Results      Component                Value               Date                      NA                       143                 09/16/2020                K                        4.0                 09/16/2020                CO2                      30 (H)              09/16/2020                GLUCOSE                  100 (H)             09/16/2020                BUN                      13                  09/16/2020                CREATININE               1.01                09/16/2020                CALCIUM                  9.8                 09/16/2020                GFRNONAA                 83                  09/16/2020                GFRAA                    96                  09/16/2020          )       Anesthesia Quick Evaluation

## 2020-09-24 NOTE — Interval H&P Note (Signed)
History and Physical Interval Note:  09/24/2020 8:41 AM  Marcus Hicks  has presented today for surgery, with the diagnosis of AFIB.  The various methods of treatment have been discussed with the patient and family. After consideration of risks, benefits and other options for treatment, the patient has consented to  Procedure(s): CARDIOVERSION (N/A) TRANSESOPHAGEAL ECHOCARDIOGRAM (TEE) as a surgical intervention.  The patient's history has been reviewed, patient examined, no change in status, stable for surgery.  I have reviewed the patient's chart and labs.  Questions were answered to the patient's satisfaction.    Patient reports missed dose of Eliquis 3 days ago.  Discussed changing procedure to either TEE/DCCV or completing 3 weeks of anticoagulation prior to DCCV.  Patient preference was TEE/DCCV today, will proceed with TEE/DCCV.   [name]

## 2020-09-24 NOTE — Transfer of Care (Signed)
Immediate Anesthesia Transfer of Care Note  Patient: Diogenes Whirley  Procedure(s) Performed: CARDIOVERSION (N/A ) TRANSESOPHAGEAL ECHOCARDIOGRAM (TEE)  Patient Location: Endoscopy Unit  Anesthesia Type:MAC  Level of Consciousness: drowsy  Airway & Oxygen Therapy: Patient Spontanous Breathing and Patient connected to nasal cannula oxygen  Post-op Assessment: Report given to RN and Post -op Vital signs reviewed and stable  Post vital signs: Reviewed and stable  Last Vitals:  Vitals Value Taken Time  BP 90/42 09/24/20 0922  Temp    Pulse 41 09/24/20 0922  Resp 20 09/24/20 0922  SpO2 98 % 09/24/20 0922  Vitals shown include unvalidated device data.  Last Pain:  Vitals:   09/24/20 0802  TempSrc: Axillary  PainSc: 0-No pain         Complications: No complications documented.

## 2020-09-24 NOTE — Discharge Instructions (Signed)
Electrical Cardioversion Electrical cardioversion is the delivery of a jolt of electricity to restore a normal rhythm to the heart. A rhythm that is too fast or is not regular keeps the heart from pumping well. In this procedure, sticky patches or metal paddles are placed on the chest to deliver electricity to the heart from a device. This procedure may be done in an emergency if:  There is low or no blood pressure as a result of the heart rhythm.  Normal rhythm must be restored as fast as possible to protect the brain and heart from further damage.  It may save a life. This may also be a scheduled procedure for irregular or fast heart rhythms that are not immediately life-threatening. Tell a health care provider about:  Any allergies you have.  All medicines you are taking, including vitamins, herbs, eye drops, creams, and over-the-counter medicines.  Any problems you or family members have had with anesthetic medicines.  Any blood disorders you have.  Any surgeries you have had.  Any medical conditions you have.  Whether you are pregnant or may be pregnant. What are the risks? Generally, this is a safe procedure. However, problems may occur, including:  Allergic reactions to medicines.  A blood clot that breaks free and travels to other parts of your body.  The possible return of an abnormal heart rhythm within hours or days after the procedure.  Your heart stopping (cardiac arrest). This is rare. What happens before the procedure? Medicines  Your health care provider may have you start taking: ? Blood-thinning medicines (anticoagulants) so your blood does not clot as easily. ? Medicines to help stabilize your heart rate and rhythm.  Ask your health care provider about: ? Changing or stopping your regular medicines. This is especially important if you are taking diabetes medicines or blood thinners. ? Taking medicines such as aspirin and ibuprofen. These medicines can  thin your blood. Do not take these medicines unless your health care provider tells you to take them. ? Taking over-the-counter medicines, vitamins, herbs, and supplements. General instructions  Follow instructions from your health care provider about eating or drinking restrictions.  Plan to have someone take you home from the hospital or clinic.  If you will be going home right after the procedure, plan to have someone with you for 24 hours.  Ask your health care provider what steps will be taken to help prevent infection. These may include washing your skin with a germ-killing soap. What happens during the procedure?   An IV will be inserted into one of your veins.  Sticky patches (electrodes) or metal paddles may be placed on your chest.  You will be given a medicine to help you relax (sedative).  An electrical shock will be delivered. The procedure may vary among health care providers and hospitals. What can I expect after the procedure?  Your blood pressure, heart rate, breathing rate, and blood oxygen level will be monitored until you leave the hospital or clinic.  Your heart rhythm will be watched to make sure it does not change.  You may have some redness on the skin where the shocks were given. Follow these instructions at home:  Do not drive for 24 hours if you were given a sedative during your procedure.  Take over-the-counter and prescription medicines only as told by your health care provider.  Ask your health care provider how to check your pulse. Check it often.  Rest for 48 hours after the procedure or   as told by your health care provider.  Avoid or limit your caffeine use as told by your health care provider.  Keep all follow-up visits as told by your health care provider. This is important. Contact a health care provider if:  You feel like your heart is beating too quickly or your pulse is not regular.  You have a serious muscle cramp that does not go  away. Get help right away if:  You have discomfort in your chest.  You are dizzy or you feel faint.  You have trouble breathing or you are short of breath.  Your speech is slurred.  You have trouble moving an arm or leg on one side of your body.  Your fingers or toes turn cold or blue. Summary  Electrical cardioversion is the delivery of a jolt of electricity to restore a normal rhythm to the heart.  This procedure may be done right away in an emergency or may be a scheduled procedure if the condition is not an emergency.  Generally, this is a safe procedure.  After the procedure, check your pulse often as told by your health care provider. This information is not intended to replace advice given to you by your health care provider. Make sure you discuss any questions you have with your health care provider. Document Revised: 06/19/2019 Document Reviewed: 06/19/2019 Elsevier Patient Education  2020 Elsevier Inc.  

## 2020-09-25 ENCOUNTER — Encounter (HOSPITAL_COMMUNITY): Payer: Self-pay | Admitting: Cardiology

## 2020-09-26 ENCOUNTER — Other Ambulatory Visit: Payer: Self-pay | Admitting: Legal Medicine

## 2020-09-26 DIAGNOSIS — E1142 Type 2 diabetes mellitus with diabetic polyneuropathy: Secondary | ICD-10-CM

## 2020-10-02 ENCOUNTER — Other Ambulatory Visit: Payer: Self-pay | Admitting: Cardiology

## 2020-10-02 DIAGNOSIS — I10 Essential (primary) hypertension: Secondary | ICD-10-CM

## 2020-10-02 MED ORDER — AMLODIPINE BESYLATE 5 MG PO TABS
5.0000 mg | ORAL_TABLET | Freq: Every day | ORAL | 3 refills | Status: DC
Start: 2020-10-02 — End: 2021-01-23

## 2020-10-02 MED ORDER — METOPROLOL SUCCINATE ER 100 MG PO TB24: 100.0000 mg | ORAL_TABLET | Freq: Every day | ORAL | 3 refills | Status: DC

## 2020-10-02 NOTE — Telephone Encounter (Signed)
*  STAT* If patient is at the pharmacy, call can be transferred to refill team.   1. Which medications need to be refilled? (please list name of each medication and dose if known)   amLODipine (NORVASC) 5 MG tablet  metoprolol succinate (TOPROL-XL) 100 MG 24 hr tablet   2. Which pharmacy/location (including street and city if local pharmacy) is medication to be sent to?  [facility] [phone] - [place] - [address] AT [address]  3. Do they need a 30 day or 90 day supply? 90   Plz call patient when refills are sent in.

## 2020-10-02 NOTE — Telephone Encounter (Signed)
Refill sent in per request and patient notified.  

## 2020-10-16 DIAGNOSIS — G4730 Sleep apnea, unspecified: Secondary | ICD-10-CM | POA: Insufficient documentation

## 2020-10-22 NOTE — Progress Notes (Signed)
Cardiology Office Note:    Date:  10/27/2020   ID:  [id number] [place], [place] [birth year], MRN [id number]  PCP:  Abigail Miyamoto, MD  Cardiologist:  Norman Herrlich, MD    Referring MD: Abigail Miyamoto,*    ASSESSMENT:    1. Persistent atrial fibrillation (HCC)   2. Chronic anticoagulation   3. HFrEF (heart failure with reduced ejection fraction) (HCC)   4. Need for immunization against influenza   5. Hypertensive heart disease with chronic systolic congestive heart failure (HCC)   6. Essential hypertension   7. Mild CAD   8. Mixed hyperlipidemia    PLAN:    In order of problems listed above:  1. Stable presently asymptomatic rate is controlled continue beta-blocker as well as anticoagulant without bleeding complication 2. Improved he has no fluid overload New York Heart Association class I continue medical therapy including his loop diuretic increase the dose of valsartan continue beta-blocker and he declines Entresto because of cost 3. Flu shot at the patient's request 4. Need to consider echocardiogram next visit 5. Stable diabetes 6. Stable CAD continue medical therapy including beta-blocker and his high intensity statin   Next appointment: 3 months   Medication Adjustments/Labs and Tests Ordered: Current medicines are reviewed at length with the patient today.  Concerns regarding medicines are outlined above.  Orders Placed This Encounter  Procedures  . EKG 12-Lead   Meds ordered this encounter  Medications  . valsartan (DIOVAN) 80 MG tablet    Sig: Take 1 tablet (80 mg total) by mouth 2 (two) times daily.    Dispense:  180 tablet    Refill:  3    Chief Complaint  Patient presents with  . Follow-up    Unfortunately recent cardioversion was ineffective in resuming sinus rhythm    History of Present Illness:    Marcus Hicks is a 54 y.o. male with a hx of persistent atrial fibrillation heart failure with reduced ejection fraction chronic  anticoagulation and hepatic cirrhosis last seen by me 09/16/2020 prior to cardioversion.  He had a outpatient cardioversion that failed to restore sinus rhythm.  He had a cardiac CTA in September 2020 with a very mild 25% LAD stenosis echocardiogram same time showed EF 35 to 40% with moderate aortic regurgitation.  He discussed the potential referral for a watchman device with his chronic liver disease. Compliance with diet, lifestyle and medications: Yes  He is functionally improved feels better presently has no cardiac awareness bleeding from his anticoagulant chest pain edema shortness of breath with activity orthopnea palpitation or syncope.  He prefers for the time being rate control and anticoagulation and declined referral to EP.  As he has had no bleeding complication we will continue his current anticoagulant Past Medical History:  Diagnosis Date  . Atrial fibrillation with rapid ventricular response (HCC) 08/12/2020  . Bicuspid aortic valve   . Chronic hepatitis C virus infection (HCC) 08/03/2013  . COPD (chronic obstructive pulmonary disease) (HCC)   . Diabetes mellitus type 2, insulin dependent (HCC)   . Essential hypertension 07/03/2015   Last Assessment & Plan:  Blood pressure is borderline high.  Advised to get better sleep, walk on a regular basis, decrease weight down to about 210 and maintain, and eliminate tobacco.  . HFrEF (heart failure with reduced ejection fraction) (HCC) 08/22/2020  . Long-term insulin use (HCC) 10/12/2016  . Obesity, unspecified 10/12/2016   Patient advised to lose down to about 210 pounds and maintained.  Regular  walking program recommended.  He knows how to lose weight.  . Sleep apnea     Past Surgical History:  Procedure Laterality Date  . CARDIOVERSION N/A 09/24/2020   Procedure: CARDIOVERSION;  Surgeon: [name], MD;  Location: [facility] ENDOSCOPY;  Service: Cardiovascular;  Laterality: N/A;  . CCTA  08/2019  . TEE WITHOUT CARDIOVERSION   09/24/2020   Procedure: TRANSESOPHAGEAL ECHOCARDIOGRAM (TEE);  Surgeon: [name], MD;  Location: [facility] ENDOSCOPY;  Service: Cardiovascular;;    Current Medications: Current Meds  Medication Sig  . albuterol (PROVENTIL) (2.5 MG/3ML) 0.083% nebulizer solution Take 3 mLs (2.5 mg total) by nebulization every 6 (six) hours as needed for wheezing or shortness of breath.  Marland Kitchen albuterol (VENTOLIN HFA) 108 (90 Base) MCG/ACT inhaler Inhale 2 puffs into the lungs every 6 (six) hours as needed for wheezing or shortness of breath.   . ALPRAZolam (XANAX) 0.5 MG tablet Take 0.5 mg by mouth 3 (three) times daily as needed for anxiety.   Marland Kitchen amLODipine (NORVASC) 5 MG tablet Take 1 tablet (5 mg total) by mouth daily.  Marland Kitchen atorvastatin (LIPITOR) 40 MG tablet TAKE 1 TABLET BY MOUTH EVERY DAY  . BD PEN NEEDLE NANO 2ND GEN 32G X 4 MM MISC daily. use as directed  . ELIQUIS 5 MG TABS tablet Take 1 tablet (5 mg total) by mouth 2 (two) times daily.  . furosemide (LASIX) 20 MG tablet Take 1 tablet (20 mg total) by mouth 2 (two) times daily.  . Gabapentin, Once-Daily, (GRALISE) 600 MG TABS Take 600 mg by mouth 2 (two) times daily.   Marland Kitchen glucose blood (CONTOUR NEXT TEST) test strip Use to check blood sugar twice daily. Dx: E11.42  . meloxicam (MOBIC) 15 MG tablet TAKE 1 TABLET(15 MG) BY MOUTH EVERY DAY (Patient taking differently: Take 15 mg by mouth daily as needed for pain. )  . metFORMIN (GLUCOPHAGE) 500 MG tablet TAKE 1 TABLET BY MOUTH TWICE DAILY (Patient taking differently: Take 500 mg by mouth 2 (two) times daily with a meal. )  . metoprolol succinate (TOPROL-XL) 100 MG 24 hr tablet Take 1 tablet (100 mg total) by mouth daily.  . Microlet Lancets MISC USE TO CHECK BLOOD SUGAR EVERY MORNING AND AFTER MEALS  . Omega-3 Fatty Acids (FISH OIL PEARLS) 183.33 MG CAPS Take 1,000 mg by mouth in the morning, at noon, in the evening, and at bedtime.   . traMADol (ULTRAM) 50 MG tablet Take 100 mg by mouth every 6 (six)  hours as needed for severe pain.   Evaristo Bury FLEXTOUCH 100 UNIT/ML FlexTouch Pen Inject 62 Units into the skin daily.  Marland Kitchen umeclidinium-vilanterol (ANORO ELLIPTA) 62.5-25 MCG/INH AEPB Inhale 1 puff into the lungs daily.  Marland Kitchen VICTOZA 18 MG/3ML SOPN Inject 1.2 mg into the skin daily.  Marland Kitchen zolpidem (AMBIEN) 10 MG tablet TAKE 1 TABLET(10 MG) BY MOUTH EVERY DAY AT BEDTIME (Patient taking differently: Take 10 mg by mouth at bedtime as needed for sleep. )  . [DISCONTINUED] valsartan (DIOVAN) 40 MG tablet Take 1 tablet (40 mg total) by mouth 2 (two) times daily.     Allergies:   Patient has no known allergies.   Social History   Socioeconomic History  . Marital status: Widowed    Spouse name: Not on file  . Number of children: Not on file  . Years of education: Not on file  . Highest education level: Not on file  Occupational History  . Not on file  Tobacco Use  .  Smoking status: Former Smoker    Quit date: 2007    Years since quitting: 14.9  . Smokeless tobacco: Former Neurosurgeon    Quit date: 2015  Advertising account planner  . Vaping Use: Never used  Substance and Sexual Activity  . Alcohol use: Not Currently  . Drug use: Never  . Sexual activity: Not on file  Other Topics Concern  . Not on file  Social History Narrative  . Not on file   Social Determinants of Health   Financial Resource Strain:   . Difficulty of Paying Living Expenses: Not on file  Food Insecurity:   . Worried About Programme researcher, broadcasting/film/video in the Last Year: Not on file  . Ran Out of Food in the Last Year: Not on file  Transportation Needs:   . Lack of Transportation (Medical): Not on file  . Lack of Transportation (Non-Medical): Not on file  Physical Activity:   . Days of Exercise per Week: Not on file  . Minutes of Exercise per Session: Not on file  Stress:   . Feeling of Stress : Not on file  Social Connections:   . Frequency of Communication with Friends and Family: Not on file  . Frequency of Social Gatherings with Friends and  Family: Not on file  . Attends Religious Services: Not on file  . Active Member of Clubs or Organizations: Not on file  . Attends Banker Meetings: Not on file  . Marital Status: Not on file     Family History: The patient's family history includes Alzheimer's disease in an other family member; Cancer in his maternal grandfather; Hypertension in an other family member; Liver disease in his sister; Memory loss in an other family member. ROS:   Please see the history of present illness.    All other systems reviewed and are negative.  EKGs/Labs/Other Studies Reviewed:    The following studies were reviewed today:  EKG:  EKG ordered today and personally reviewed.  The ekg ordered today demonstrates atrial fibrillation controlled ventricular rate right bundle branch block  Recent Labs: 02/23/2020: ALT 48 09/16/2020: BUN 13; Creatinine, Ser 1.01; Hemoglobin 16.8; NT-Pro BNP 762; Platelets 214; Potassium 4.0; Sodium 143  Recent Lipid Panel    Component Value Date/Time   CHOL 200 (H) 02/23/2020 0845   TRIG 369 (H) 02/23/2020 0845   HDL 38 (L) 02/23/2020 0845   CHOLHDL 5.3 (H) 02/23/2020 0845   LDLCALC 100 (H) 02/23/2020 0845    Physical Exam:    VS:  BP 119/78   Pulse 85   Ht 5\' 5"  (1.651 m)   Wt 222 lb (100.7 kg)   SpO2 96%   BMI 36.94 kg/m     Wt Readings from Last 3 Encounters:  10/23/20 222 lb (100.7 kg)  09/24/20 222 lb (100.7 kg)  09/16/20 225 lb (102.1 kg)     GEN:  Well nourished, well developed in no acute distress HEENT: Normal NECK: No JVD; No carotid bruits LYMPHATICS: No lymphadenopathy CARDIAC: Irregular S1 variable no murmurs, rubs, gallops RESPIRATORY:  Clear to auscultation without rales, wheezing or rhonchi  ABDOMEN: Soft, non-tender, non-distended MUSCULOSKELETAL:  No edema; No deformity  SKIN: Warm and dry NEUROLOGIC:  Alert and oriented x 3 PSYCHIATRIC:  Normal affect    Signed, 09/18/20, MD  10/27/2020 11:27 AM    [facility]

## 2020-10-23 ENCOUNTER — Other Ambulatory Visit: Payer: Self-pay

## 2020-10-23 ENCOUNTER — Encounter: Payer: Self-pay | Admitting: Cardiology

## 2020-10-23 ENCOUNTER — Ambulatory Visit (INDEPENDENT_AMBULATORY_CARE_PROVIDER_SITE_OTHER): Payer: 59 | Admitting: Cardiology

## 2020-10-23 VITALS — BP 119/78 | HR 85 | Ht 65.0 in | Wt 222.0 lb

## 2020-10-23 DIAGNOSIS — I4819 Other persistent atrial fibrillation: Secondary | ICD-10-CM | POA: Diagnosis not present

## 2020-10-23 DIAGNOSIS — I5022 Chronic systolic (congestive) heart failure: Secondary | ICD-10-CM

## 2020-10-23 DIAGNOSIS — I5020 Unspecified systolic (congestive) heart failure: Secondary | ICD-10-CM

## 2020-10-23 DIAGNOSIS — Z23 Encounter for immunization: Secondary | ICD-10-CM | POA: Diagnosis not present

## 2020-10-23 DIAGNOSIS — I110 Hypertensive heart disease with heart failure: Secondary | ICD-10-CM

## 2020-10-23 DIAGNOSIS — I2510 Atherosclerotic heart disease of native coronary artery without angina pectoris: Secondary | ICD-10-CM

## 2020-10-23 DIAGNOSIS — I10 Essential (primary) hypertension: Secondary | ICD-10-CM | POA: Diagnosis not present

## 2020-10-23 DIAGNOSIS — E782 Mixed hyperlipidemia: Secondary | ICD-10-CM

## 2020-10-23 DIAGNOSIS — Z7901 Long term (current) use of anticoagulants: Secondary | ICD-10-CM

## 2020-10-23 MED ORDER — VALSARTAN 80 MG PO TABS: 80.0000 mg | ORAL_TABLET | Freq: Two times a day (BID) | ORAL | 3 refills | Status: DC

## 2020-10-23 NOTE — Patient Instructions (Signed)
Medication Instructions:  Your physician has recommended you make the following change in your medication:  INCREASE: Valsartan 80 mg take one tablet by mouth twice daily.  *If you need a refill on your cardiac medications before your next appointment, please call your pharmacy*   Lab Work: None If you have labs (blood work) drawn today and your tests are completely normal, you will receive your results only by: Marland Kitchen MyChart Message (if you have MyChart) OR . A paper copy in the mail If you have any lab test that is abnormal or we need to change your treatment, we will call you to review the results.   Testing/Procedures: None   Follow-Up: At [facility], you and your health needs are our priority.  As part of our continuing mission to provide you with exceptional heart care, we have created designated Provider Care Teams.  These Care Teams include your primary Cardiologist (physician) and Advanced Practice Providers (APPs -  Physician Assistants and Nurse Practitioners) who all work together to provide you with the care you need, when you need it.  We recommend signing up for the patient portal called "MyChart".  Sign up information is provided on this After Visit Summary.  MyChart is used to connect with patients for Virtual Visits (Telemedicine).  Patients are able to view lab/test results, encounter notes, upcoming appointments, etc.  Non-urgent messages can be sent to your provider as well.   To learn more about what you can do with MyChart, go to [email or web address].    Your next appointment:   3 month(s)  The format for your next appointment:   In Person  Provider:   Norman Herrlich, MD   Other Instructions

## 2020-10-29 ENCOUNTER — Other Ambulatory Visit: Payer: Self-pay | Admitting: Legal Medicine

## 2020-11-25 ENCOUNTER — Other Ambulatory Visit: Payer: Self-pay | Admitting: Legal Medicine

## 2020-11-26 ENCOUNTER — Ambulatory Visit (INDEPENDENT_AMBULATORY_CARE_PROVIDER_SITE_OTHER): Payer: 59 | Admitting: Legal Medicine

## 2020-11-26 ENCOUNTER — Other Ambulatory Visit: Payer: Self-pay

## 2020-11-26 ENCOUNTER — Encounter: Payer: Self-pay | Admitting: Legal Medicine

## 2020-11-26 VITALS — BP 136/76 | HR 91 | Temp 97.2°F | Resp 17 | Ht 65.0 in | Wt 218.0 lb

## 2020-11-26 DIAGNOSIS — I5020 Unspecified systolic (congestive) heart failure: Secondary | ICD-10-CM

## 2020-11-26 DIAGNOSIS — Z6838 Body mass index (BMI) 38.0-38.9, adult: Secondary | ICD-10-CM

## 2020-11-26 DIAGNOSIS — M8949 Other hypertrophic osteoarthropathy, multiple sites: Secondary | ICD-10-CM

## 2020-11-26 DIAGNOSIS — E1142 Type 2 diabetes mellitus with diabetic polyneuropathy: Secondary | ICD-10-CM | POA: Diagnosis not present

## 2020-11-26 DIAGNOSIS — B182 Chronic viral hepatitis C: Secondary | ICD-10-CM

## 2020-11-26 DIAGNOSIS — Z6834 Body mass index (BMI) 34.0-34.9, adult: Secondary | ICD-10-CM | POA: Insufficient documentation

## 2020-11-26 DIAGNOSIS — I5023 Acute on chronic systolic (congestive) heart failure: Secondary | ICD-10-CM | POA: Diagnosis not present

## 2020-11-26 DIAGNOSIS — E782 Mixed hyperlipidemia: Secondary | ICD-10-CM | POA: Diagnosis not present

## 2020-11-26 DIAGNOSIS — I4819 Other persistent atrial fibrillation: Secondary | ICD-10-CM

## 2020-11-26 DIAGNOSIS — Z6836 Body mass index (BMI) 36.0-36.9, adult: Secondary | ICD-10-CM

## 2020-11-26 DIAGNOSIS — J449 Chronic obstructive pulmonary disease, unspecified: Secondary | ICD-10-CM | POA: Diagnosis not present

## 2020-11-26 DIAGNOSIS — I10 Essential (primary) hypertension: Secondary | ICD-10-CM

## 2020-11-26 DIAGNOSIS — M159 Polyosteoarthritis, unspecified: Secondary | ICD-10-CM

## 2020-11-26 HISTORY — DX: Body mass index (BMI) 38.0-38.9, adult: Z68.38

## 2020-11-26 MED ORDER — GRALISE 600 MG PO TABS: 600.0000 mg | ORAL_TABLET | Freq: Two times a day (BID) | ORAL | 2 refills | Status: DC

## 2020-11-26 MED ORDER — FISH OIL PEARLS 183.33 MG PO CAPS: 1000.0000 mg | ORAL_CAPSULE | Freq: Four times a day (QID) | ORAL | 6 refills | Status: DC

## 2020-11-26 MED ORDER — CONTOUR NEXT TEST VI STRP: 1.0000 | ORAL_STRIP | 4 refills | Status: DC | PRN

## 2020-11-26 NOTE — Progress Notes (Signed)
Subjective:  Patient ID: Marcus Hicks, male    DOB: Oct 26, 1966  Age: 54 y.o. MRN: [id number]  Chief Complaint  Patient presents with  . Hyperlipidemia  . Hypertension    HPI: Chronic visit  Patient presents for follow up of hypertension.  Patient tolerating valsartn well with side effects.  Patient was diagnosed with hypertension 2010 so has been treated for hypertension for 10 years.Patient is working on maintaining diet and exercise regimen and follows up as directed. Complication include none.  Patient presents with hyperlipidemia.  Compliance with treatment has been good; patient takes medicines as directed, maintains low cholesterol diet, follows up as directed, and maintains exercise regimen.  Patient is using atorvastin without problems.  Patient present with type 2 diabetes.  Specifically, this is type 2, insulin requiring diabetes, complicated by poyneuropathy.  Compliance with treatment has been good; patient take medicines as directed, maintains diet and exercise regimen, follows up as directed, and is keeping glucose diary.  Date of  diagnosis 2010.  Depression screen has been performed.Tobacco screen nonsmoker. Current medicines for diabetes victoza, tresiba 62 units, metformin Patient is on nothing for renal protection and atorvastatin for cholesterol control.  Patient performs foot exams daily and last ophthalmologic exam was yes.   Current Outpatient Medications on File Prior to Visit  Medication Sig Dispense Refill  . albuterol (PROVENTIL) (2.5 MG/3ML) 0.083% nebulizer solution Take 3 mLs (2.5 mg total) by nebulization every 6 (six) hours as needed for wheezing or shortness of breath. 150 mL 3  . albuterol (VENTOLIN HFA) 108 (90 Base) MCG/ACT inhaler Inhale 2 puffs into the lungs every 6 (six) hours as needed for wheezing or shortness of breath.     . ALPRAZolam (XANAX) 0.5 MG tablet Take 0.5 mg by mouth 3 (three) times daily as needed for anxiety.     Marland Kitchen amLODipine  (NORVASC) 5 MG tablet Take 1 tablet (5 mg total) by mouth daily. 90 tablet 3  . atorvastatin (LIPITOR) 40 MG tablet TAKE 1 TABLET BY MOUTH EVERY DAY 90 tablet 2  . BD PEN NEEDLE NANO 2ND GEN 32G X 4 MM MISC daily. use as directed    . ELIQUIS 5 MG TABS tablet Take 1 tablet (5 mg total) by mouth 2 (two) times daily. 60 tablet 3  . furosemide (LASIX) 20 MG tablet Take 1 tablet (20 mg total) by mouth 2 (two) times daily. 180 tablet 3  . meloxicam (MOBIC) 15 MG tablet TAKE 1 TABLET(15 MG) BY MOUTH EVERY DAY (Patient taking differently: Take 15 mg by mouth daily as needed for pain.) 90 tablet 2  . metFORMIN (GLUCOPHAGE) 500 MG tablet TAKE 1 TABLET BY MOUTH TWICE DAILY (Patient taking differently: Take 500 mg by mouth 2 (two) times daily with a meal.) 60 tablet 6  . metoprolol succinate (TOPROL-XL) 100 MG 24 hr tablet Take 1 tablet (100 mg total) by mouth daily. 90 tablet 3  . Microlet Lancets MISC USE TO CHECK BLOOD SUGAR EVERY MORNING AND AFTER MEALS    . traMADol (ULTRAM) 50 MG tablet Take 100 mg by mouth every 6 (six) hours as needed for severe pain.     Evaristo Bury FLEXTOUCH 100 UNIT/ML FlexTouch Pen Inject 62 Units into the skin daily. 18 mL 6  . umeclidinium-vilanterol (ANORO ELLIPTA) 62.5-25 MCG/INH AEPB Inhale 1 puff into the lungs daily.    . valsartan (DIOVAN) 80 MG tablet Take 1 tablet (80 mg total) by mouth 2 (two) times daily. 180 tablet 3  .  VICTOZA 18 MG/3ML SOPN ADMINISTER 1.2 MG UNDER THE SKIN DAILY 12 mL 6  . zolpidem (AMBIEN) 10 MG tablet Take 1 tablet (10 mg total) by mouth at bedtime as needed for sleep. 30 tablet 3   No current facility-administered medications on file prior to visit.   Past Medical History:  Diagnosis Date  . Atrial fibrillation with rapid ventricular response (HCC) 08/12/2020  . Bicuspid aortic valve   . Chronic hepatitis C virus infection (HCC) 08/03/2013  . COPD (chronic obstructive pulmonary disease) (HCC)   . Diabetes mellitus type 2, insulin dependent (HCC)    . Essential hypertension 07/03/2015   Last Assessment & Plan:  Blood pressure is borderline high.  Advised to get better sleep, walk on a regular basis, decrease weight down to about 210 and maintain, and eliminate tobacco.  . HFrEF (heart failure with reduced ejection fraction) (HCC) 08/22/2020  . Long-term insulin use (HCC) 10/12/2016  . Obesity, unspecified 10/12/2016   Patient advised to lose down to about 210 pounds and maintained.  Regular walking program recommended.  He knows how to lose weight.  . Sleep apnea    Past Surgical History:  Procedure Laterality Date  . CARDIOVERSION N/A 09/24/2020   Procedure: CARDIOVERSION;  Surgeon: [name], MD;  Location: [facility] ENDOSCOPY;  Service: Cardiovascular;  Laterality: N/A;  . CCTA  08/2019  . TEE WITHOUT CARDIOVERSION  09/24/2020   Procedure: TRANSESOPHAGEAL ECHOCARDIOGRAM (TEE);  Surgeon: [name], MD;  Location: [facility] ENDOSCOPY;  Service: Cardiovascular;;    Family History  Problem Relation Age of Onset  . Cancer Maternal Grandfather   . Liver disease Sister   . Alzheimer's disease Other   . Memory loss Other   . Hypertension Other    Social History   Socioeconomic History  . Marital status: Widowed    Spouse name: Not on file  . Number of children: Not on file  . Years of education: Not on file  . Highest education level: Not on file  Occupational History  . Not on file  Tobacco Use  . Smoking status: Former Smoker    Quit date: 2007    Years since quitting: 15.0  . Smokeless tobacco: Current User    Types: Snuff  Vaping Use  . Vaping Use: Never used  Substance and Sexual Activity  . Alcohol use: Not Currently  . Drug use: Never  . Sexual activity: Not on file  Other Topics Concern  . Not on file  Social History Narrative  . Not on file   Social Determinants of Health   Financial Resource Strain: Not on file  Food Insecurity: Not on file  Transportation Needs: Not on file  Physical  Activity: Not on file  Stress: Not on file  Social Connections: Not on file    Review of Systems  Constitutional: Negative for activity change, diaphoresis and fatigue.  HENT: Negative for congestion, sinus pain and sore throat.   Eyes: Negative for visual disturbance.  Respiratory: Negative for cough, chest tightness and shortness of breath.   Cardiovascular: Negative for chest pain, palpitations and leg swelling.  Gastrointestinal: Negative.  Negative for abdominal distention and abdominal pain.  Endocrine: Negative for polyuria.  Genitourinary: Negative for difficulty urinating, dysuria and urgency.  Musculoskeletal: Negative for arthralgias and back pain.  Skin: Negative.   Psychiatric/Behavioral: Positive for agitation.     Objective:  BP 136/76 (BP Location: Left Arm, Patient Position: Sitting, Cuff Size: Normal)   Pulse 91   Temp Marland Kitchen)  97.2 F (36.2 C) (Temporal)   Resp 17   Ht 5\' 5"  (1.651 m)   Wt 218 lb (98.9 kg)   SpO2 96%   BMI 36.28 kg/m   BP/Weight 11/26/2020 10/23/2020 09/24/2020  Systolic BP 136 119 111  Diastolic BP 76 78 93  Wt. (Lbs) 218 222 222  BMI 36.28 36.94 36.94    Physical Exam Vitals reviewed.  Constitutional:      General: He is not in acute distress.    Appearance: Normal appearance. He is not ill-appearing.  HENT:     Right Ear: Tympanic membrane, ear canal and external ear normal.     Left Ear: Tympanic membrane, ear canal and external ear normal.     Nose: Nose normal.     Mouth/Throat:     Mouth: Mucous membranes are moist.     Pharynx: Oropharynx is clear.  Eyes:     Extraocular Movements: Extraocular movements intact.     Conjunctiva/sclera: Conjunctivae normal.     Pupils: Pupils are equal, round, and reactive to light.  Cardiovascular:     Rate and Rhythm: Normal rate and regular rhythm.     Pulses: Normal pulses.     Heart sounds: Normal heart sounds. No murmur heard.   Pulmonary:     Effort: Pulmonary effort is normal.  No respiratory distress.     Breath sounds: Normal breath sounds. No rales.  Abdominal:     General: Abdomen is flat. Bowel sounds are normal. There is no distension.     Palpations: Abdomen is soft.     Tenderness: There is no abdominal tenderness.  Musculoskeletal:        General: Normal range of motion.     Cervical back: Normal range of motion.  Skin:    General: Skin is warm and dry.     Capillary Refill: Capillary refill takes less than 2 seconds.  Neurological:     General: No focal deficit present.     Mental Status: He is alert and oriented to person, place, and time. Mental status is at baseline.     Diabetic Foot Exam - Simple   Simple Foot Form Diabetic Foot exam was performed with the following findings: Yes 11/26/2020 10:42 AM  Visual Inspection No deformities, no ulcerations, no other skin breakdown bilaterally: Yes Sensation Testing See comments: Yes Pulse Check Posterior Tibialis and Dorsalis pulse intact bilaterally: Yes Comments Decreased light touch      Lab Results  Component Value Date   WBC 7.5 09/16/2020   HGB 16.8 09/16/2020   HCT 50.6 09/16/2020   PLT 214 09/16/2020   GLUCOSE 100 (H) 09/16/2020   CHOL 200 (H) 02/23/2020   TRIG 369 (H) 02/23/2020   HDL 38 (L) 02/23/2020   LDLCALC 100 (H) 02/23/2020   ALT 48 (H) 02/23/2020   AST 27 02/23/2020   NA 143 09/16/2020   K 4.0 09/16/2020   CL 98 09/16/2020   CREATININE 1.01 09/16/2020   BUN 13 09/16/2020   CO2 30 (H) 09/16/2020   HGBA1C 8.9 (H) 02/23/2020      Assessment & Plan:   1. Acute on chronic systolic heart failure [facility]) An individualized care plan was established and reinforced.  The patient's disease status was assessed using clinical finding son exam today, labs, and/or other diagnostic testing such as x-rays, to determine the patient's success in meeting treatmentgoalsbased on disease-based guidelines and found to beimproving. But not at goal yet. Medications prescriptions no  changes Laboratory tests  ordered to be performed today include routine. RECOMMENDATIONS: given include see cardiology.  Call physician is patient gains 3 lbs in one day or 5 lbs for one week.  Call for progressive PND, orthopnea or increased pedal edema.  2. Chronic obstructive pulmonary disease, unspecified COPD type (HCC) An individualize plan was formulated for care of COPD.  Treatment is evidence based.  She will continue on inhalers, avoid smoking and smoke.  Regular exercise with help with dyspnea. Routine follow ups and medication compliance is needed.  3. Type 2 diabetes mellitus with diabetic polyneuropathy, unspecified whether long term insulin use (HCC) - Hemoglobin A1c - Gabapentin, Once-Daily, (GRALISE) 600 MG TABS; Take 600 mg by mouth 2 (two) times daily.  Dispense: 180 tablet; Refill: 2 - glucose blood (CONTOUR NEXT TEST) test strip; 1 each by Other route as needed for other. Use as instructed  Dispense: 100 each; Refill: 4 An individual care plan for diabetes was established and reinforced today.  The patient's status was assessed using clinical findings on exam, labs and diagnostic testing. Patient success at meeting goals based on disease specific evidence-based guidelines and found to be good controlled. Medications were assessed and patient's understanding of the medical issues , including barriers were assessed. Recommend adherence to a diabetic diet, a graduated exercise program, HgbA1c level is checked quarterly, and urine microalbumin performed yearly .  Annual mono-filament sensation testing performed. Lower blood pressure and control hyperlipidemia is important. Get annual eye exams and annual flu shots and smoking cessation discussed.  Self management goals were discussed.  4. Mixed hyperlipidemia - Lipid panel - Omega-3 Fatty Acids (FISH OIL PEARLS) 183.33 MG CAPS; Take 1,000 mg by mouth in the morning, at noon, in the evening, and at bedtime.  Dispense: 360 capsule;  Refill: 6 AN INDIVIDUAL CARE PLAN fofairr hyperlipidemia/ cholesterol was established and reinforced today.  The patient's status was assessed using clinical findings on exam, lab and other diagnostic tests. The patient's disease status was assessed based on evidence-based guidelines and found to be fair controlled. MEDICATIONS were reviewed. SELF MANAGEMENT GOALS have been discussed and patient's success at attaining the goal of low cholesterol was assessed. RECOMMENDATION given include regular exercise 3 days a week and low cholesterol/low fat diet. CLINICAL SUMMARY including written plan to identify barriers unique to the patient due to social or economic  reasons was discussed.  5. Essential hypertension - CBC with Differential/Platelet - Comprehensive metabolic panel An individual hypertension care plan was established and reinforced today.  The patient's status was assessed using clinical findings on exam and labs or diagnostic tests. The patient's success at meeting treatment goals on disease specific evidence-based guidelines and found to be well controlled. SELF MANAGEMENT: The patient and I together assessed ways to personally work towards obtaining the recommended goals. RECOMMENDATIONS: avoid decongestants found in common cold remedies, decrease consumption of alcohol, perform routine monitoring of BP with home BP cuff, exercise, reduction of dietary salt, take medicines as prescribed, try not to miss doses and quit smoking.  Regular exercise and maintaining a healthy weight is needed.  Stress reduction may help. A CLINICAL SUMMARY including written plan identify barriers to care unique to individual due to social or financial issues.  We attempt to mutually creat solutions for individual and family understanding.  6. Primary osteoarthritis involving multiple joints AN INDIVIDUAL CARE PLAN for osteoarthritis was established and reinforced today.  The patient's status was assessed using  clinical findings on exam, labs, and other diagnostic testing. Patient's success  at meeting treatment goals based on disease specific evidence-bassed guidelines and found to be in fair control. RECOMMENDATIONS include maintaining present medicines and treatment.  7. HFrEF (heart failure with reduced ejection fraction) (HCC) An individualized care plan was established and reinforced.  The patient's disease status was assessed using clinical finding son exam today, labs, and/or other diagnostic testing such as x-rays, to determine the patient's success in meeting treatmentgoalsbased on disease-based guidelines and found to beimproving. But not at goal yet. Medications prescriptions no changes Laboratory tests ordered to be performed today include routine. RECOMMENDATIONS: given include see cardiology.  Call physician is patient gains 3 lbs in one day or 5 lbs for one week.  Call for progressive PND, orthopnea or increased pedal edema.  8. Persistent atrial fibrillation [facility]) Patient has a diagnosis of permanent atrial fibrillation.   Patient is on eliquis and has controlled ventricular response.  Patient is CV stable.  9. BMI 36.0-36.9,adult An individualize plan was formulated for obesity using patient history and physical exam to encourage weight loss.  An evidence based program was formulated.  Patient is to cut portion size with meals and to plan physical exercise 3 days a week at least 20 minutes.  Weight watchers and other programs are helpful.  Planned amount of weight loss 10 lbs.with diabetes and hypertension , he meets criteria for morbid obesity.  10. Chronic hepatitis C without hepatic coma (HCC) Patient's hepatitis C has been treated and no detectable particles present   11. Morbid obesity An individualize plan was formulated for obesity using patient history and physical exam to encourage weight loss.  An evidence based program was formulated.  Patient is to cut portion size with meals  and to plan physical exercise 3 days a week at least 20 minutes.  Weight watchers and other programs are helpful.  Planned amount of weight loss 10 lbs.       I spent 25 minutes dedicated to the care of this patient on the date of this encounter to include face-to-face time with the patient, as well as: discussion of his CHF and treatment. We discused diabetes treatment and his depression that is improving. s Follow-up: Return in about 4 months (around 03/27/2021) for fasting.  An After Visit Summary was printed and given to the patient.  Brent Bulla, MD [facility] [phone]

## 2020-11-27 LAB — LIPID PANEL
Chol/HDL Ratio: 4.6 ratio (ref 0.0–5.0)
Cholesterol, Total: 162 mg/dL (ref 100–199)
HDL: 35 mg/dL — ABNORMAL LOW (ref >39)
LDL Chol Calc (NIH): 107 mg/dL — ABNORMAL HIGH (ref 0–99)
Triglycerides: 111 mg/dL (ref 0–149)
VLDL Cholesterol Cal: 20 mg/dL (ref 5–40)

## 2020-11-27 LAB — CBC WITH DIFFERENTIAL/PLATELET
Basophils Absolute: 0.1 10*3/uL (ref 0.0–0.2)
Basos: 1 %
EOS (ABSOLUTE): 0.2 10*3/uL (ref 0.0–0.4)
Eos: 3 %
Hematocrit: 46.7 % (ref 37.5–51.0)
Hemoglobin: 15.8 g/dL (ref 13.0–17.7)
Immature Grans (Abs): 0 10*3/uL (ref 0.0–0.1)
Immature Granulocytes: 0 %
Lymphocytes Absolute: 1.7 10*3/uL (ref 0.7–3.1)
Lymphs: 22 %
MCH: 30.4 pg (ref 26.6–33.0)
MCHC: 33.8 g/dL (ref 31.5–35.7)
MCV: 90 fL (ref 79–97)
Monocytes Absolute: 0.6 10*3/uL (ref 0.1–0.9)
Monocytes: 7 %
Neutrophils Absolute: 5.2 10*3/uL (ref 1.4–7.0)
Neutrophils: 67 %
Platelets: 220 10*3/uL (ref 150–450)
RBC: 5.2 x10E6/uL (ref 4.14–5.80)
RDW: 13.1 % (ref 11.6–15.4)
WBC: 7.8 10*3/uL (ref 3.4–10.8)

## 2020-11-27 LAB — COMPREHENSIVE METABOLIC PANEL
ALT: 29 IU/L (ref 0–44)
AST: 30 IU/L (ref 0–40)
Albumin/Globulin Ratio: 1.7 (ref 1.2–2.2)
Albumin: 4.8 g/dL (ref 3.8–4.9)
Alkaline Phosphatase: 62 IU/L (ref 44–121)
BUN/Creatinine Ratio: 18 (ref 9–20)
BUN: 19 mg/dL (ref 6–24)
Bilirubin Total: 0.5 mg/dL (ref 0.0–1.2)
CO2: 29 mmol/L (ref 20–29)
Calcium: 9.7 mg/dL (ref 8.7–10.2)
Chloride: 98 mmol/L (ref 96–106)
Creatinine, Ser: 1.05 mg/dL (ref 0.76–1.27)
GFR calc Af Amer: 92 mL/min/{1.73_m2} (ref >59)
GFR calc non Af Amer: 80 mL/min/{1.73_m2} (ref >59)
Globulin, Total: 2.9 g/dL (ref 1.5–4.5)
Glucose: 117 mg/dL — ABNORMAL HIGH (ref 65–99)
Potassium: 3.8 mmol/L (ref 3.5–5.2)
Sodium: 141 mmol/L (ref 134–144)
Total Protein: 7.7 g/dL (ref 6.0–8.5)

## 2020-11-27 LAB — CARDIOVASCULAR RISK ASSESSMENT

## 2020-11-27 LAB — HEMOGLOBIN A1C
Est. average glucose Bld gHb Est-mCnc: 137 mg/dL
Hgb A1c MFr Bld: 6.4 % — ABNORMAL HIGH (ref 4.8–5.6)

## 2020-11-27 NOTE — Progress Notes (Signed)
Cbc normal glucose 117, kidney tests normal, liver tests normal, A1c 6.4 good, LDL cholesterol 107 high take atorvastatin daily,  lp

## 2020-12-06 ENCOUNTER — Other Ambulatory Visit: Payer: Self-pay | Admitting: Legal Medicine

## 2020-12-06 ENCOUNTER — Other Ambulatory Visit: Payer: Self-pay | Admitting: Cardiology

## 2020-12-06 DIAGNOSIS — E1142 Type 2 diabetes mellitus with diabetic polyneuropathy: Secondary | ICD-10-CM

## 2020-12-06 NOTE — Telephone Encounter (Signed)
Prescription refill request for Eliquis received. Indication:atrial fibrillation Last office visit:11/21 [place] Scr:1.05  12/21 Age: 55 Weight:98.9 kg  Prescription refilled

## 2020-12-24 ENCOUNTER — Other Ambulatory Visit: Payer: Self-pay | Admitting: Legal Medicine

## 2020-12-24 DIAGNOSIS — E1142 Type 2 diabetes mellitus with diabetic polyneuropathy: Secondary | ICD-10-CM

## 2021-01-22 NOTE — Progress Notes (Unsigned)
Cardiology Office Note:    Date:  01/23/2021   ID:  [name], [name] 1966/04/27, MRN [id number]  PCP:  Marcus Miyamoto, MD  Cardiologist:  Marcus Herrlich, MD    Referring MD: Marcus Hicks,*    ASSESSMENT:    1. Persistent atrial fibrillation (HCC)   2. Chronic anticoagulation   3. HFrEF (heart failure with reduced ejection fraction) (HCC)   4. Mild CAD   5. Mixed hyperlipidemia   6. Cirrhosis of liver without ascites, unspecified hepatic cirrhosis type (HCC)   7. Bicuspid aortic valve    PLAN:    In order of problems listed above:  1. Is a very complicated case there are many simultaneous problems he remains in atrial fibrillation rate controlled with beta-blocker he is anticoagulated with Eliquis.  I asked him to make decisions regarding referral for EP catheter ablation the potential watchman device.  He will remain anticoagulated.  He is at increased risk long-term of bleeding with liver disease. 2. Heart failure is decompensated fluid overloaded increase in switch diuretics to torsemide try again to get him approved for Entresto and if that occurs to stop valsartan and reduce his calcium channel blocker. 3. Stable mild CAD 4. Continue with statin lipids at target 5. This places him at increased bleeding risk and long-term is a good candidate for watchman device 6. I reviewed his TEE myself he does not have significant aortic stenosis or aortic regurgitation consider a follow-up echocardiogram at 1 year this fall   Next appointment: 1 month   Medication Adjustments/Labs and Tests Ordered: Current medicines are reviewed at length with the patient today.  Concerns regarding medicines are outlined above.  No orders of the defined types were placed in this encounter.  No orders of the defined types were placed in this encounter.   Chief Complaint  Patient presents with  . Follow-up  . Atrial Fibrillation  . Congestive Heart Failure    History of  Present Illness:    Marcus Hicks is a 55 y.o. male with a hx of persistent atrial fibrillation heart failure with reduced ejection fraction he is chronically anticoagulated and has hepatic cirrhosis.  Attempts to resume sinus rhythm cardioversion failed.  Cardiac CTA September 2020 showed mild 25% LAD stenosis echocardiogram showed ejection fraction time 35 to 40% and he had a bicuspid aortic valve with moderate aortic regurgitation.  He was last seen 10/23/2020.  His echocardiogram at [facility] 08/12/2020 had  aortic stenosis mean gradient 24 mmHg at that time the valve is described as trileaflet with normal excursion and there is no aortic regurgitation. Compliance with diet, lifestyle and medications: Yes  His weight is up in excess of 5 currently up in excess of 10 pounds. He has increased edema He has chronic exertional shortness of breath unchanged no orthopnea chest pain palpitation or syncope. I personally reviewed his TEE the valve is clearly bicuspid and it most is mildly restricted he does not have significant aortic stenosis. He is interested in [place] device especially with his cirrhosis. He is interested in A. fib ablation but is not ready to make a decision. Today he is decompensated heart failure will transition to higher dose of diuretic switching to torsemide try to get him on Entresto if he get insurance benefit and reduce his calcium channel blocker. I asked him to make a decision about interventions by the time he seen by me next. Past Medical History:  Diagnosis Date  . Atrial fibrillation with  rapid ventricular response (HCC) 08/12/2020  . Bicuspid aortic valve   . Chronic hepatitis C virus infection (HCC) 08/03/2013  . COPD (chronic obstructive pulmonary disease) (HCC)   . Diabetes mellitus type 2, insulin dependent (HCC)   . Essential hypertension 07/03/2015   Last Assessment & Plan:  Blood pressure is borderline high.  Advised to get better sleep, walk on a  regular basis, decrease weight down to about 210 and maintain, and eliminate tobacco.  . HFrEF (heart failure with reduced ejection fraction) (HCC) 08/22/2020  . Long-term insulin use (HCC) 10/12/2016  . Obesity, unspecified 10/12/2016   Patient advised to lose down to about 210 pounds and maintained.  Regular walking program recommended.  He knows how to lose weight.  . Sleep apnea     Past Surgical History:  Procedure Laterality Date  . CARDIOVERSION N/A 09/24/2020   Procedure: CARDIOVERSION;  Surgeon: Marcus Ishikawa, MD;  Location: [facility] ENDOSCOPY;  Service: Cardiovascular;  Laterality: N/A;  . CCTA  08/2019  . TEE WITHOUT CARDIOVERSION  09/24/2020   Procedure: TRANSESOPHAGEAL ECHOCARDIOGRAM (TEE);  Surgeon: Marcus Ishikawa, MD;  Location: [facility] ENDOSCOPY;  Service: Cardiovascular;;    Current Medications: Current Meds  Medication Sig  . albuterol (PROVENTIL) (2.5 MG/3ML) 0.083% nebulizer solution Take 3 mLs (2.5 mg total) by nebulization every 6 (six) hours as needed for wheezing or shortness of breath.  Marland Kitchen albuterol (VENTOLIN HFA) 108 (90 Base) MCG/ACT inhaler Inhale 2 puffs into the lungs every 6 (six) hours as needed for wheezing or shortness of breath.   . ALPRAZolam (XANAX) 0.5 MG tablet Take 0.5 mg by mouth 3 (three) times daily as needed for anxiety.   Marland Kitchen amLODipine (NORVASC) 5 MG tablet Take 1 tablet (5 mg total) by mouth daily.  Marland Kitchen atorvastatin (LIPITOR) 40 MG tablet TAKE 1 TABLET BY MOUTH EVERY DAY  . BD PEN NEEDLE NANO 2ND GEN 32G X 4 MM MISC USE AS DIRECTED ONCE DAILY  . ELIQUIS 5 MG TABS tablet TAKE 1 TABLET(5 MG) BY MOUTH TWICE DAILY  . furosemide (LASIX) 20 MG tablet Take 1 tablet (20 mg total) by mouth 2 (two) times daily.  . Gabapentin, Once-Daily, (GRALISE) 600 MG TABS Take 600 mg by mouth 2 (two) times daily.  Marland Kitchen glucose blood (CONTOUR NEXT TEST) test strip 1 each by Other route as needed for other. Use as instructed  . meloxicam (MOBIC) 15 MG tablet TAKE 1  TABLET(15 MG) BY MOUTH EVERY DAY (Patient taking differently: Take 15 mg by mouth daily as needed for pain.)  . metFORMIN (GLUCOPHAGE) 500 MG tablet Take 1 tablet (500 mg total) by mouth 2 (two) times daily with a meal.  . metoprolol succinate (TOPROL-XL) 100 MG 24 hr tablet Take 1 tablet (100 mg total) by mouth daily.  . Microlet Lancets MISC USE TO CHECK BLOOD SUGAR EVERY MORNING AND AFTER MEALS  . Omega-3 Fatty Acids (FISH OIL PEARLS) 183.33 MG CAPS Take 1,000 mg by mouth in the morning, at noon, in the evening, and at bedtime.  . traMADol (ULTRAM) 50 MG tablet Take 100 mg by mouth every 6 (six) hours as needed for severe pain.   Evaristo Bury FLEXTOUCH 100 UNIT/ML FlexTouch Pen Inject 62 Units into the skin daily.  Marland Kitchen umeclidinium-vilanterol (ANORO ELLIPTA) 62.5-25 MCG/INH AEPB Inhale 1 puff into the lungs daily.  . valsartan (DIOVAN) 80 MG tablet Take 1 tablet (80 mg total) by mouth 2 (two) times daily.  Marland Kitchen VICTOZA 18 MG/3ML SOPN ADMINISTER 1.2 MG UNDER  THE SKIN DAILY  . zolpidem (AMBIEN) 10 MG tablet Take 1 tablet (10 mg total) by mouth at bedtime as needed for sleep.     Allergies:   Patient has no known allergies.   Social History   Socioeconomic History  . Marital status: Widowed    Spouse name: Not on file  . Number of children: Not on file  . Years of education: Not on file  . Highest education level: Not on file  Occupational History  . Not on file  Tobacco Use  . Smoking status: Former Smoker    Quit date: 2007    Years since quitting: 15.1  . Smokeless tobacco: Current User    Types: Snuff  Vaping Use  . Vaping Use: Never used  Substance and Sexual Activity  . Alcohol use: Not Currently  . Drug use: Never  . Sexual activity: Not on file  Other Topics Concern  . Not on file  Social History Narrative  . Not on file   Social Determinants of Health   Financial Resource Strain: Not on file  Food Insecurity: Not on file  Transportation Needs: Not on file  Physical  Activity: Not on file  Stress: Not on file  Social Connections: Not on file     Family History: The patient's family history includes Alzheimer's disease in an other family member; Cancer in his maternal grandfather; Hypertension in an other family member; Liver disease in his sister; Memory loss in an other family member. ROS:   Please see the history of present illness.    All other systems reviewed and are negative.  EKGs/Labs/Other Studies Reviewed:    The following studies were reviewed today:  Recent Labs: 09/16/2020: NT-Pro BNP 762 11/26/2020: ALT 29; BUN 19; Creatinine, Ser 1.05; Hemoglobin 15.8; Platelets 220; Potassium 3.8; Sodium 141  Recent Lipid Panel    Component Value Date/Time   CHOL 162 11/26/2020 1056   TRIG 111 11/26/2020 1056   HDL 35 (L) 11/26/2020 1056   CHOLHDL 4.6 11/26/2020 1056   LDLCALC 107 (H) 11/26/2020 1056    Physical Exam:    VS:  BP 116/82   Pulse 72   Ht 5\' 5"  (1.651 m)   Wt 229 lb 12.8 oz (104.2 kg)   SpO2 99%   BMI 38.24 kg/m     Wt Readings from Last 3 Encounters:  01/23/21 229 lb 12.8 oz (104.2 kg)  11/26/20 218 lb (98.9 kg)  10/23/20 222 lb (100.7 kg)     GEN:  Well nourished, well developed in no acute distress HEENT: Normal NECK: Mild JVD; No carotid bruits LYMPHATICS: No lymphadenopathy CARDIAC: 1-2 of 6 outflow murmur radiates towards the right clavicle not the carotids cannot auscultate aortic regurgitation irregular rhythm and variable first heart sound RESPIRATORY:  Clear to auscultation without rales, wheezing or rhonchi  ABDOMEN: Soft, non-tender, non-distended MUSCULOSKELETAL: Plus bilateral lower extremity pitting edema; No deformity  SKIN: Warm and dry NEUROLOGIC:  Alert and oriented x 3 PSYCHIATRIC:  Normal affect    Signed, 10/25/20, MD  01/23/2021 1:19 PM    [facility]

## 2021-01-23 ENCOUNTER — Other Ambulatory Visit: Payer: Self-pay

## 2021-01-23 ENCOUNTER — Encounter: Payer: Self-pay | Admitting: Cardiology

## 2021-01-23 ENCOUNTER — Ambulatory Visit (INDEPENDENT_AMBULATORY_CARE_PROVIDER_SITE_OTHER): Payer: 59 | Admitting: Cardiology

## 2021-01-23 VITALS — BP 116/82 | HR 72 | Ht 65.0 in | Wt 229.8 lb

## 2021-01-23 DIAGNOSIS — E782 Mixed hyperlipidemia: Secondary | ICD-10-CM

## 2021-01-23 DIAGNOSIS — Q231 Congenital insufficiency of aortic valve: Secondary | ICD-10-CM

## 2021-01-23 DIAGNOSIS — I4819 Other persistent atrial fibrillation: Secondary | ICD-10-CM | POA: Diagnosis not present

## 2021-01-23 DIAGNOSIS — I2510 Atherosclerotic heart disease of native coronary artery without angina pectoris: Secondary | ICD-10-CM | POA: Diagnosis not present

## 2021-01-23 DIAGNOSIS — Z7901 Long term (current) use of anticoagulants: Secondary | ICD-10-CM

## 2021-01-23 DIAGNOSIS — I5020 Unspecified systolic (congestive) heart failure: Secondary | ICD-10-CM | POA: Diagnosis not present

## 2021-01-23 DIAGNOSIS — K7460 Unspecified cirrhosis of liver: Secondary | ICD-10-CM

## 2021-01-23 MED ORDER — AMLODIPINE BESYLATE 5 MG PO TABS: 5.0000 mg | ORAL_TABLET | ORAL | 3 refills | Status: DC

## 2021-01-23 MED ORDER — TORSEMIDE 20 MG PO TABS: 20.0000 mg | ORAL_TABLET | Freq: Two times a day (BID) | ORAL | 3 refills | Status: DC

## 2021-01-23 MED ORDER — SACUBITRIL-VALSARTAN 49-51 MG PO TABS: 1.0000 | ORAL_TABLET | Freq: Two times a day (BID) | ORAL | 3 refills | Status: DC

## 2021-01-23 NOTE — Addendum Note (Signed)
Addended by: Delorse Limber I on: 01/23/2021 01:26 PM   Modules accepted: Orders

## 2021-01-23 NOTE — Patient Instructions (Signed)
Medication Instructions:  Your physician has recommended you make the following change in your medication:  STOP: Furosemide STOP: Valsartan START: Torsemide 20 mg take one tablet by mouth twice daily.  START: Entresto 49/1 mg take one tablet by mouth twice daily.  DECREASE: Amlodipine take one tablet by mouth every other day. *If you need a refill on your cardiac medications before your next appointment, please call your pharmacy*   Lab Work: Your physician recommends that you return for lab work in: TODAY BMP, ProBNP If you have labs (blood work) drawn today and your tests are completely normal, you will receive your results only by: Marland Kitchen MyChart Message (if you have MyChart) OR . A paper copy in the mail If you have any lab test that is abnormal or we need to change your treatment, we will call you to review the results.   Testing/Procedures: None   Follow-Up: At [facility], you and your health needs are our priority.  As part of our continuing mission to provide you with exceptional heart care, we have created designated Provider Care Teams.  These Care Teams include your primary Cardiologist (physician) and Advanced Practice Providers (APPs -  Physician Assistants and Nurse Practitioners) who all work together to provide you with the care you need, when you need it.  We recommend signing up for the patient portal called "MyChart".  Sign up information is provided on this After Visit Summary.  MyChart is used to connect with patients for Virtual Visits (Telemedicine).  Patients are able to view lab/test results, encounter notes, upcoming appointments, etc.  Non-urgent messages can be sent to your provider as well.   To learn more about what you can do with MyChart, go to [email or web address].    Your next appointment:   4 week(s)  The format for your next appointment:   In Person  Provider:   Norman Herrlich, MD   Other Instructions

## 2021-01-24 ENCOUNTER — Telehealth: Payer: Self-pay

## 2021-01-24 LAB — BASIC METABOLIC PANEL
BUN/Creatinine Ratio: 15 (ref 9–20)
BUN: 13 mg/dL (ref 6–24)
CO2: 27 mmol/L (ref 20–29)
Calcium: 9.4 mg/dL (ref 8.7–10.2)
Chloride: 97 mmol/L (ref 96–106)
Creatinine, Ser: 0.89 mg/dL (ref 0.76–1.27)
GFR calc Af Amer: 110 mL/min/{1.73_m2} (ref >59)
GFR calc non Af Amer: 96 mL/min/{1.73_m2} (ref >59)
Glucose: 115 mg/dL — ABNORMAL HIGH (ref 65–99)
Potassium: 4.4 mmol/L (ref 3.5–5.2)
Sodium: 141 mmol/L (ref 134–144)

## 2021-01-24 LAB — PRO B NATRIURETIC PEPTIDE: NT-Pro BNP: 792 pg/mL — ABNORMAL HIGH (ref 0–210)

## 2021-01-24 NOTE — Telephone Encounter (Signed)
-----   Message from Baldo Daub, MD sent at 01/24/2021  7:52 AM EST ----- Good/stable results  I increased his diuretic at his office visit.

## 2021-01-24 NOTE — Telephone Encounter (Signed)
Spoke with patient regarding results and recommendation.  Patient verbalizes understanding and is agreeable to plan of care. Advised patient to call back with any issues or concerns.  

## 2021-01-28 ENCOUNTER — Ambulatory Visit (INDEPENDENT_AMBULATORY_CARE_PROVIDER_SITE_OTHER): Payer: 59 | Admitting: Legal Medicine

## 2021-01-28 ENCOUNTER — Encounter: Payer: Self-pay | Admitting: Legal Medicine

## 2021-01-28 ENCOUNTER — Other Ambulatory Visit: Payer: Self-pay

## 2021-01-28 VITALS — BP 100/70 | HR 58 | Temp 96.6°F | Resp 16 | Ht 65.0 in | Wt 220.0 lb

## 2021-01-28 DIAGNOSIS — Z0000 Encounter for general adult medical examination without abnormal findings: Secondary | ICD-10-CM

## 2021-01-28 DIAGNOSIS — B182 Chronic viral hepatitis C: Secondary | ICD-10-CM

## 2021-01-28 HISTORY — DX: Encounter for general adult medical examination without abnormal findings: Z00.00

## 2021-01-28 NOTE — Progress Notes (Signed)
Subjective:  Patient ID: Marcus Hicks, male    DOB: September 29, 1966  Age: 55 y.o. MRN: [id number]  Chief Complaint  Patient presents with  . Back Pain    HPI: patient needs disability papers completed.  Patient has Class 3 CHF HFrEF, atrial fibrillation,    Current Outpatient Medications on File Prior to Visit  Medication Sig Dispense Refill  . albuterol (PROVENTIL) (2.5 MG/3ML) 0.083% nebulizer solution Take 3 mLs (2.5 mg total) by nebulization every 6 (six) hours as needed for wheezing or shortness of breath. 150 mL 3  . albuterol (VENTOLIN HFA) 108 (90 Base) MCG/ACT inhaler Inhale 2 puffs into the lungs every 6 (six) hours as needed for wheezing or shortness of breath.     . ALPRAZolam (XANAX) 0.5 MG tablet Take 0.5 mg by mouth 3 (three) times daily as needed for anxiety.     Marland Kitchen amLODipine (NORVASC) 5 MG tablet Take 1 tablet (5 mg total) by mouth every other day. 45 tablet 3  . atorvastatin (LIPITOR) 40 MG tablet TAKE 1 TABLET BY MOUTH EVERY DAY 90 tablet 2  . BD PEN NEEDLE NANO 2ND GEN 32G X 4 MM MISC USE AS DIRECTED ONCE DAILY 100 each 4  . ELIQUIS 5 MG TABS tablet TAKE 1 TABLET(5 MG) BY MOUTH TWICE DAILY 60 tablet 5  . Gabapentin, Once-Daily, (GRALISE) 600 MG TABS Take 600 mg by mouth 2 (two) times daily. 180 tablet 2  . glucose blood (CONTOUR NEXT TEST) test strip 1 each by Other route as needed for other. Use as instructed 100 each 4  . meloxicam (MOBIC) 15 MG tablet TAKE 1 TABLET(15 MG) BY MOUTH EVERY DAY (Patient taking differently: Take 15 mg by mouth daily as needed for pain.) 90 tablet 2  . metFORMIN (GLUCOPHAGE) 500 MG tablet Take 1 tablet (500 mg total) by mouth 2 (two) times daily with a meal. 180 tablet 2  . metoprolol succinate (TOPROL-XL) 100 MG 24 hr tablet Take 1 tablet (100 mg total) by mouth daily. 90 tablet 3  . Microlet Lancets MISC USE TO CHECK BLOOD SUGAR EVERY MORNING AND AFTER MEALS    . Omega-3 Fatty Acids (FISH OIL PEARLS) 183.33 MG CAPS Take 1,000 mg by  mouth in the morning, at noon, in the evening, and at bedtime. 360 capsule 6  . sacubitril-valsartan (ENTRESTO) 49-51 MG Take 1 tablet by mouth 2 (two) times daily. 180 tablet 3  . torsemide (DEMADEX) 20 MG tablet Take 1 tablet (20 mg total) by mouth 2 (two) times daily. 180 tablet 3  . traMADol (ULTRAM) 50 MG tablet Take 100 mg by mouth every 6 (six) hours as needed for severe pain.     Evaristo Bury FLEXTOUCH 100 UNIT/ML FlexTouch Pen Inject 62 Units into the skin daily. 18 mL 6  . umeclidinium-vilanterol (ANORO ELLIPTA) 62.5-25 MCG/INH AEPB Inhale 1 puff into the lungs daily.    Marland Kitchen VICTOZA 18 MG/3ML SOPN ADMINISTER 1.2 MG UNDER THE SKIN DAILY 12 mL 6  . zolpidem (AMBIEN) 10 MG tablet Take 1 tablet (10 mg total) by mouth at bedtime as needed for sleep. 30 tablet 3   No current facility-administered medications on file prior to visit.   Past Medical History:  Diagnosis Date  . Atrial fibrillation with rapid ventricular response (HCC) 08/12/2020  . Bicuspid aortic valve   . Chronic hepatitis C virus infection (HCC) 08/03/2013  . COPD (chronic obstructive pulmonary disease) (HCC)   . Diabetes mellitus type 2, insulin dependent (HCC)   .  Essential hypertension 07/03/2015   Last Assessment & Plan:  Blood pressure is borderline high.  Advised to get better sleep, walk on a regular basis, decrease weight down to about 210 and maintain, and eliminate tobacco.  . HFrEF (heart failure with reduced ejection fraction) (HCC) 08/22/2020  . Long-term insulin use (HCC) 10/12/2016  . Obesity, unspecified 10/12/2016   Patient advised to lose down to about 210 pounds and maintained.  Regular walking program recommended.  He knows how to lose weight.  . Sleep apnea    Past Surgical History:  Procedure Laterality Date  . CARDIOVERSION N/A 09/24/2020   Procedure: CARDIOVERSION;  Surgeon: [name], MD;  Location: [facility] ENDOSCOPY;  Service: Cardiovascular;  Laterality: N/A;  . CCTA  08/2019  . TEE WITHOUT  CARDIOVERSION  09/24/2020   Procedure: TRANSESOPHAGEAL ECHOCARDIOGRAM (TEE);  Surgeon: [name], MD;  Location: [facility] ENDOSCOPY;  Service: Cardiovascular;;    Family History  Problem Relation Age of Onset  . Cancer Maternal Grandfather   . Liver disease Sister   . Alzheimer's disease Other   . Memory loss Other   . Hypertension Other    Social History   Socioeconomic History  . Marital status: Widowed    Spouse name: Not on file  . Number of children: Not on file  . Years of education: Not on file  . Highest education level: Not on file  Occupational History  . Not on file  Tobacco Use  . Smoking status: Former Smoker    Quit date: 2007    Years since quitting: 15.1  . Smokeless tobacco: Current User    Types: Snuff  Vaping Use  . Vaping Use: Never used  Substance and Sexual Activity  . Alcohol use: Not Currently  . Drug use: Never  . Sexual activity: Not on file  Other Topics Concern  . Not on file  Social History Narrative  . Not on file   Social Determinants of Health   Financial Resource Strain: Not on file  Food Insecurity: Not on file  Transportation Needs: Not on file  Physical Activity: Not on file  Stress: Not on file  Social Connections: Not on file    Review of Systems  Constitutional: Negative.  Negative for activity change, appetite change and unexpected weight change.  HENT: Negative for congestion and sinus pain.   Eyes: Negative for visual disturbance.  Respiratory: Positive for apnea. Negative for chest tightness and shortness of breath.   Cardiovascular: Negative for chest pain, palpitations and leg swelling.  Gastrointestinal: Negative for abdominal distention and abdominal pain.  Endocrine: Negative for polyuria.  Genitourinary: Negative for difficulty urinating and dysuria.  Musculoskeletal: Negative for arthralgias and back pain.  Skin: Negative.   Neurological: Negative.   Psychiatric/Behavioral: Negative.       Objective:  BP 100/70   Pulse (!) 58   Temp (!) 96.6 F (35.9 C)   Resp 16   Ht 5\' 5"  (1.651 m)   Wt 220 lb (99.8 kg)   SpO2 94%   BMI 36.61 kg/m   BP/Weight 01/28/2021 01/23/2021 11/26/2020  Systolic BP 100 116 136  Diastolic BP 70 82 76  Wt. (Lbs) 220 229.8 218  BMI 36.61 38.24 36.28    Physical Exam Vitals reviewed.  Constitutional:      Appearance: Normal appearance.  HENT:     Head: Normocephalic.     Right Ear: Tympanic membrane, ear canal and external ear normal.     Left Ear: Tympanic  membrane, ear canal and external ear normal.     Nose: Nose normal.     Mouth/Throat:     Mouth: Mucous membranes are moist.     Pharynx: Oropharynx is clear.  Eyes:     Extraocular Movements: Extraocular movements intact.     Conjunctiva/sclera: Conjunctivae normal.     Pupils: Pupils are equal, round, and reactive to light.  Cardiovascular:     Rate and Rhythm: Normal rate. Rhythm irregular.     Pulses: Normal pulses.     Heart sounds: Normal heart sounds. No murmur heard. No gallop.   Pulmonary:     Effort: Pulmonary effort is normal. No respiratory distress.     Breath sounds: Normal breath sounds. No rales.  Abdominal:     General: Abdomen is flat. Bowel sounds are normal. There is no distension.     Palpations: Abdomen is soft.     Tenderness: There is no abdominal tenderness.  Musculoskeletal:        General: Normal range of motion.     Cervical back: Normal range of motion and neck supple.  Skin:    General: Skin is warm and dry.     Capillary Refill: Capillary refill takes less than 2 seconds.  Neurological:     General: No focal deficit present.     Mental Status: He is alert and oriented to person, place, and time.       Lab Results  Component Value Date   WBC 7.8 11/26/2020   HGB 15.8 11/26/2020   HCT 46.7 11/26/2020   PLT 220 11/26/2020   GLUCOSE 115 (H) 01/23/2021   CHOL 162 11/26/2020   TRIG 111 11/26/2020   HDL 35 (L) 11/26/2020    LDLCALC 107 (H) 11/26/2020   ALT 29 11/26/2020   AST 30 11/26/2020   NA 141 01/23/2021   K 4.4 01/23/2021   CL 97 01/23/2021   CREATININE 0.89 01/23/2021   BUN 13 01/23/2021   CO2 27 01/23/2021   HGBA1C 6.4 (H) 11/26/2020      Assessment & Plan:   Diagnoses and all orders for this visit: Chronic hepatitis C without hepatic coma (HCC) -     Ambulatory referral to Gastroenterology Refer to [facility] liver clinic for follow up  Routine general medical examination at a health care facility Disability forms completed        I spent 30 minutes dedicated to the care of this patient on the date of this encounter to include face-to-face time with the patient, as well as:  Follow-up: Return if symptoms worsen or fail to improve.  An After Visit Summary was printed and given to the patient.  Brent Bulla, MD [facility] [phone]

## 2021-02-10 ENCOUNTER — Other Ambulatory Visit: Payer: Self-pay

## 2021-02-10 ENCOUNTER — Other Ambulatory Visit: Payer: 59

## 2021-02-10 ENCOUNTER — Other Ambulatory Visit: Payer: Self-pay | Admitting: Legal Medicine

## 2021-02-10 DIAGNOSIS — Z8619 Personal history of other infectious and parasitic diseases: Secondary | ICD-10-CM

## 2021-02-11 LAB — HCV RNA QUANT: Hepatitis C Quantitation: NOT DETECTED IU/mL

## 2021-02-12 NOTE — Progress Notes (Signed)
Negative Hepatitis C RNA assay quantitative lp

## 2021-02-17 ENCOUNTER — Telehealth: Payer: Self-pay

## 2021-02-17 NOTE — Telephone Encounter (Signed)
I faxed on 02/18/2020 the results of HCV RNA Quantitative to [facility] due to referral. They requested within 90 days of referral.

## 2021-02-19 NOTE — Progress Notes (Unsigned)
Cardiology Office Note:    Date:  02/20/2021   ID:  Marcus Hicks, DOB Jul 14, 1966, MRN [id number]  PCP:  Abigail Miyamoto, MD  Cardiologist:  Norman Herrlich, MD    Referring MD: Abigail Miyamoto,*    ASSESSMENT:    1. Persistent atrial fibrillation (HCC)   2. Chronic anticoagulation   3. Mild CAD   4. HFrEF (heart failure with reduced ejection fraction) (HCC)   5. Mixed hyperlipidemia   6. Bicuspid aortic valve   7. Cirrhosis of liver without ascites, unspecified hepatic cirrhosis type (HCC)    PLAN:    In order of problems listed above:  1. Stable rate is controlled anticoagulated without bleeding complication I see him back in 6 weeks if agreeable I will make arrangements for initiation of Tikosyn 2. Continue his anticoagulant long-term may be well served with watchman device 3. Stable having no angina continue current medical treatment [place] Heart Association class I he is on a beta-blocker 4. Compensated no fluid overload continue beta-blocker and Entresto I am not can to try to uptitrate and currently not on a loop diuretic 5. Continue a statin lipids at target 6. He has no significant valvular dysfunction   Next appointment: 6 weeks   Medication Adjustments/Labs and Tests Ordered: Current medicines are reviewed at length with the patient today.  Concerns regarding medicines are outlined above.  Orders Placed This Encounter  Procedures  . CBC  . Comprehensive metabolic panel  . Pro b natriuretic peptide (BNP)   No orders of the defined types were placed in this encounter.   Chief Complaint  Patient presents with  . Follow-up    History of Present Illness:    Marcus Hicks is a 55 y.o. male with a hx of persistent atrial fibrillation rate controlled with a beta-blocker and anticoagulated with Eliquis heart failure reduced ejection fraction mild CAD hyperlipidemia hepatic cirrhosis and a bicuspid aortic valve last seen  01/23/2021.Cardiac CTA September 2020 showed mild 25% LAD stenosis echocardiogram showed ejection fraction time 35 to 40% and he had a bicuspid aortic valve with moderate aortic regurgitation.  His echocardi[facility] at [facility] 08/12/2020 had  aortic stenosis mean gradient 24 mmHg at that time the valve is described as trileaflet with normal excursion and there is no aortic regurgitation.   Compliance with diet, lifestyle and medications: Yes  Overall he is unchanged he has exercise intolerance and fatigue but no edema shortness of breath palpitation or syncope. Blood pressure outside of today consistently in the range of 1 10-1 30 systolic. He remains in rate controlled atrial fibrillation He wants to do defer attempts of resuming sinus rhythm and follow-up in 6 weeks.  I think it be best served by [facility] therapy.  I be has not to give him amiodarone with his liver disease He has pallor of his skin and cool extremities.  TEE was performed 09/24/2020 EF 35% aortic valve was bicuspid there is mild regurgitation I reviewed the images the valve was not restricted. Past Medical History:  Diagnosis Date  . Atrial fibrillation with rapid ventricular response (HCC) 08/12/2020  . Bicuspid aortic valve   . Chronic hepatitis C virus infection (HCC) 08/03/2013  . COPD (chronic obstructive pulmonary disease) (HCC)   . Diabetes mellitus type 2, insulin dependent (HCC)   . Essential hypertension 07/03/2015   Last Assessment & Plan:  Blood pressure is borderline high.  Advised to get better sleep, walk on a regular basis, decrease weight down  to about 210 and maintain, and eliminate tobacco.  . HFrEF (heart failure with reduced ejection fraction) (HCC) 08/22/2020  . Long-term insulin use (HCC) 10/12/2016  . Obesity, unspecified 10/12/2016   Patient advised to lose down to about 210 pounds and maintained.  Regular walking program recommended.  He knows how to lose weight.  . Sleep apnea     Past Surgical  History:  Procedure Laterality Date  . CARDIOVERSION N/A 09/24/2020   Procedure: CARDIOVERSION;  Surgeon: [name], MD;  Location: [facility] ENDOSCOPY;  Service: Cardiovascular;  Laterality: N/A;  . CCTA  08/2019  . TEE WITHOUT CARDIOVERSION  09/24/2020   Procedure: TRANSESOPHAGEAL ECHOCARDIOGRAM (TEE);  Surgeon: [name], MD;  Location: [facility] ENDOSCOPY;  Service: Cardiovascular;;    Current Medications: Current Meds  Medication Sig  . albuterol (PROVENTIL) (2.5 MG/3ML) 0.083% nebulizer solution Take 3 mLs (2.5 mg total) by nebulization every 6 (six) hours as needed for wheezing or shortness of breath.  . ALPRAZolam (XANAX) 0.5 MG tablet Take 0.5 mg by mouth 3 (three) times daily as needed for anxiety.   Marland Kitchen amLODipine (NORVASC) 5 MG tablet Take 1 tablet (5 mg total) by mouth every other day.  Marland Kitchen atorvastatin (LIPITOR) 40 MG tablet TAKE 1 TABLET BY MOUTH EVERY DAY  . BD PEN NEEDLE NANO 2ND GEN 32G X 4 MM MISC USE AS DIRECTED ONCE DAILY  . ELIQUIS 5 MG TABS tablet TAKE 1 TABLET(5 MG) BY MOUTH TWICE DAILY  . glucose blood (CONTOUR NEXT TEST) test strip 1 each by Other route as needed for other. Use as instructed  . metFORMIN (GLUCOPHAGE) 500 MG tablet Take 1 tablet (500 mg total) by mouth 2 (two) times daily with a meal.  . metoprolol succinate (TOPROL-XL) 100 MG 24 hr tablet Take 1 tablet (100 mg total) by mouth daily.  . Microlet Lancets MISC USE TO CHECK BLOOD SUGAR EVERY MORNING AND AFTER MEALS  . sacubitril-valsartan (ENTRESTO) 49-51 MG Take 1 tablet by mouth 2 (two) times daily.  Marland Kitchen torsemide (DEMADEX) 20 MG tablet Take 1 tablet (20 mg total) by mouth 2 (two) times daily.  . traMADol (ULTRAM) 50 MG tablet Take 100 mg by mouth every 6 (six) hours as needed for severe pain.   Evaristo Bury FLEXTOUCH 100 UNIT/ML FlexTouch Pen Inject 62 Units into the skin daily.  Marland Kitchen umeclidinium-vilanterol (ANORO ELLIPTA) 62.5-25 MCG/INH AEPB Inhale 1 puff into the lungs daily.  Marland Kitchen VICTOZA 18  MG/3ML SOPN ADMINISTER 1.2 MG UNDER THE SKIN DAILY  . zolpidem (AMBIEN) 10 MG tablet Take 1 tablet (10 mg total) by mouth at bedtime as needed for sleep.     Allergies:   Patient has no known allergies.   Social History   Socioeconomic History  . Marital status: Widowed    Spouse name: Not on file  . Number of children: Not on file  . Years of education: Not on file  . Highest education level: Not on file  Occupational History  . Not on file  Tobacco Use  . Smoking status: Former Smoker    Quit date: 2007    Years since quitting: 15.2  . Smokeless tobacco: Current User    Types: Snuff  Vaping Use  . Vaping Use: Never used  Substance and Sexual Activity  . Alcohol use: Not Currently  . Drug use: Never  . Sexual activity: Not on file  Other Topics Concern  . Not on file  Social History Narrative  . Not on file  Social Determinants of Health   Financial Resource Strain: Not on file  Food Insecurity: Not on file  Transportation Needs: Not on file  Physical Activity: Not on file  Stress: Not on file  Social Connections: Not on file     Family History: The patient's family history includes Alzheimer's disease in an other family member; Cancer in his maternal grandfather; Hypertension in an other family member; Liver disease in his sister; Memory loss in an other family member. ROS:   Please see the history of present illness.    All other systems reviewed and are negative.  EKGs/Labs/Other Studies Reviewed:    The following studies were reviewed today:    Recent Labs: 11/26/2020: ALT 29; Hemoglobin 15.8; Platelets 220 01/23/2021: BUN 13; Creatinine, Ser 0.89; NT-Pro BNP 792; Potassium 4.4; Sodium 141  Recent Lipid Panel    Component Value Date/Time   CHOL 162 11/26/2020 1056   TRIG 111 11/26/2020 1056   HDL 35 (L) 11/26/2020 1056   CHOLHDL 4.6 11/26/2020 1056   LDLCALC 107 (H) 11/26/2020 1056    Physical Exam:    VS:  BP 90/70 (BP Location: Right Arm,  Patient Position: Sitting, Cuff Size: Normal)   Pulse (!) 54   Ht 5\' 5"  (1.651 m)   Wt 224 lb (101.6 kg)   SpO2 97%   BMI 37.28 kg/m     Wt Readings from Last 3 Encounters:  02/20/21 224 lb (101.6 kg)  01/28/21 220 lb (99.8 kg)  01/23/21 229 lb 12.8 oz (104.2 kg)     GEN:  Well nourished, well developed in no acute distress HEENT: Normal NECK: No JVD; No carotid bruits LYMPHATICS: No lymphadenopathy CARDIAC: Irregular S1 variable no  rubs, gallops he has a grade 1/6 murmur systolic ejection aortic area RESPIRATORY:  Clear to auscultation without rales, wheezing or rhonchi  ABDOMEN: Soft, non-tender, non-distended MUSCULOSKELETAL:  No edema; No deformity  SKIN: Warm and dry NEUROLOGIC:  Alert and oriented x 3 PSYCHIATRIC:  Normal affect    Signed, 01/25/21, MD  02/20/2021 4:06 PM    [facility]

## 2021-02-20 ENCOUNTER — Ambulatory Visit (INDEPENDENT_AMBULATORY_CARE_PROVIDER_SITE_OTHER): Payer: 59 | Admitting: Cardiology

## 2021-02-20 ENCOUNTER — Encounter: Payer: Self-pay | Admitting: Cardiology

## 2021-02-20 ENCOUNTER — Other Ambulatory Visit: Payer: Self-pay

## 2021-02-20 VITALS — BP 90/70 | HR 54 | Ht 65.0 in | Wt 224.0 lb

## 2021-02-20 DIAGNOSIS — Z7901 Long term (current) use of anticoagulants: Secondary | ICD-10-CM

## 2021-02-20 DIAGNOSIS — I4819 Other persistent atrial fibrillation: Secondary | ICD-10-CM

## 2021-02-20 DIAGNOSIS — K7460 Unspecified cirrhosis of liver: Secondary | ICD-10-CM

## 2021-02-20 DIAGNOSIS — I2510 Atherosclerotic heart disease of native coronary artery without angina pectoris: Secondary | ICD-10-CM | POA: Diagnosis not present

## 2021-02-20 DIAGNOSIS — E782 Mixed hyperlipidemia: Secondary | ICD-10-CM

## 2021-02-20 DIAGNOSIS — Q231 Congenital insufficiency of aortic valve: Secondary | ICD-10-CM

## 2021-02-20 DIAGNOSIS — I5020 Unspecified systolic (congestive) heart failure: Secondary | ICD-10-CM | POA: Diagnosis not present

## 2021-02-20 NOTE — Patient Instructions (Signed)
Medication Instructions:  Your physician recommends that you continue on your current medications as directed. Please refer to the Current Medication list given to you today.  *If you need a refill on your cardiac medications before your next appointment, please call your pharmacy*   Lab Work: Your physician recommends that you return for lab work in: TODAY CBC, CMP, ProBNP If you have labs (blood work) drawn today and your tests are completely normal, you will receive your results only by: Marland Kitchen MyChart Message (if you have MyChart) OR . A paper copy in the mail If you have any lab test that is abnormal or we need to change your treatment, we will call you to review the results.   Testing/Procedures: None   Follow-Up: At [facility], you and your health needs are our priority.  As part of our continuing mission to provide you with exceptional heart care, we have created designated Provider Care Teams.  These Care Teams include your primary Cardiologist (physician) and Advanced Practice Providers (APPs -  Physician Assistants and Nurse Practitioners) who all work together to provide you with the care you need, when you need it.  We recommend signing up for the patient portal called "MyChart".  Sign up information is provided on this After Visit Summary.  MyChart is used to connect with patients for Virtual Visits (Telemedicine).  Patients are able to view lab/test results, encounter notes, upcoming appointments, etc.  Non-urgent messages can be sent to your provider as well.   To learn more about what you can do with MyChart, go to [email or web address].    Your next appointment:   6 week(s)  The format for your next appointment:   In Person  Provider:   Norman Herrlich, MD   Other Instructions

## 2021-02-21 ENCOUNTER — Telehealth: Payer: Self-pay

## 2021-02-21 LAB — COMPREHENSIVE METABOLIC PANEL
ALT: 46 IU/L — ABNORMAL HIGH (ref 0–44)
AST: 34 IU/L (ref 0–40)
Albumin/Globulin Ratio: 1.8 (ref 1.2–2.2)
Albumin: 5.1 g/dL — ABNORMAL HIGH (ref 3.8–4.9)
Alkaline Phosphatase: 62 IU/L (ref 44–121)
BUN/Creatinine Ratio: 10 (ref 9–20)
BUN: 10 mg/dL (ref 6–24)
Bilirubin Total: 0.5 mg/dL (ref 0.0–1.2)
CO2: 29 mmol/L (ref 20–29)
Calcium: 9.8 mg/dL (ref 8.7–10.2)
Chloride: 94 mmol/L — ABNORMAL LOW (ref 96–106)
Creatinine, Ser: 0.98 mg/dL (ref 0.76–1.27)
Globulin, Total: 2.9 g/dL (ref 1.5–4.5)
Glucose: 94 mg/dL (ref 65–99)
Potassium: 4.1 mmol/L (ref 3.5–5.2)
Sodium: 143 mmol/L (ref 134–144)
Total Protein: 8 g/dL (ref 6.0–8.5)
eGFR: 91 mL/min/{1.73_m2} (ref >59)

## 2021-02-21 LAB — CBC
Hematocrit: 47.6 % (ref 37.5–51.0)
Hemoglobin: 16.4 g/dL (ref 13.0–17.7)
MCH: 30.7 pg (ref 26.6–33.0)
MCHC: 34.5 g/dL (ref 31.5–35.7)
MCV: 89 fL (ref 79–97)
Platelets: 206 10*3/uL (ref 150–450)
RBC: 5.34 x10E6/uL (ref 4.14–5.80)
RDW: 12.1 % (ref 11.6–15.4)
WBC: 8.6 10*3/uL (ref 3.4–10.8)

## 2021-02-21 LAB — PRO B NATRIURETIC PEPTIDE: NT-Pro BNP: 407 pg/mL — ABNORMAL HIGH (ref 0–210)

## 2021-02-21 NOTE — Telephone Encounter (Signed)
Spoke with patient regarding results and recommendation.  Patient verbalizes understanding and is agreeable to plan of care. Advised patient to call back with any issues or concerns.  

## 2021-02-21 NOTE — Telephone Encounter (Signed)
-----   Message from Baldo Daub, MD sent at 02/21/2021  8:26 AM EDT ----- Good results stable including the laboratory test for heart failure that is better  No change in treatment

## 2021-03-26 ENCOUNTER — Other Ambulatory Visit: Payer: Self-pay | Admitting: Legal Medicine

## 2021-03-26 DIAGNOSIS — E782 Mixed hyperlipidemia: Secondary | ICD-10-CM

## 2021-03-27 ENCOUNTER — Ambulatory Visit: Payer: 59 | Admitting: Legal Medicine

## 2021-04-05 ENCOUNTER — Other Ambulatory Visit: Payer: Self-pay | Admitting: Legal Medicine

## 2021-04-05 DIAGNOSIS — E1142 Type 2 diabetes mellitus with diabetic polyneuropathy: Secondary | ICD-10-CM

## 2021-04-09 ENCOUNTER — Encounter: Payer: Self-pay | Admitting: Legal Medicine

## 2021-04-09 ENCOUNTER — Other Ambulatory Visit: Payer: Self-pay

## 2021-04-09 ENCOUNTER — Ambulatory Visit (INDEPENDENT_AMBULATORY_CARE_PROVIDER_SITE_OTHER): Payer: 59 | Admitting: Legal Medicine

## 2021-04-09 VITALS — BP 90/60 | HR 78 | Temp 95.8°F | Resp 16 | Ht 65.0 in | Wt 221.0 lb

## 2021-04-09 DIAGNOSIS — E1142 Type 2 diabetes mellitus with diabetic polyneuropathy: Secondary | ICD-10-CM | POA: Diagnosis not present

## 2021-04-09 DIAGNOSIS — I4819 Other persistent atrial fibrillation: Secondary | ICD-10-CM | POA: Diagnosis not present

## 2021-04-09 DIAGNOSIS — F5102 Adjustment insomnia: Secondary | ICD-10-CM

## 2021-04-09 DIAGNOSIS — Z6836 Body mass index (BMI) 36.0-36.9, adult: Secondary | ICD-10-CM

## 2021-04-09 DIAGNOSIS — I10 Essential (primary) hypertension: Secondary | ICD-10-CM | POA: Diagnosis not present

## 2021-04-09 DIAGNOSIS — E782 Mixed hyperlipidemia: Secondary | ICD-10-CM

## 2021-04-09 DIAGNOSIS — I5020 Unspecified systolic (congestive) heart failure: Secondary | ICD-10-CM | POA: Diagnosis not present

## 2021-04-09 DIAGNOSIS — G4733 Obstructive sleep apnea (adult) (pediatric): Secondary | ICD-10-CM

## 2021-04-09 DIAGNOSIS — J449 Chronic obstructive pulmonary disease, unspecified: Secondary | ICD-10-CM

## 2021-04-09 LAB — POCT UA - MICROALBUMIN: Microalbumin Ur, POC: 30 mg/L

## 2021-04-09 MED ORDER — ALPRAZOLAM 0.5 MG PO TABS: 0.5000 mg | ORAL_TABLET | Freq: Three times a day (TID) | ORAL | 3 refills | Status: DC | PRN

## 2021-04-09 MED ORDER — ZOLPIDEM TARTRATE 10 MG PO TABS: 10.0000 mg | ORAL_TABLET | Freq: Every evening | ORAL | 3 refills | Status: DC | PRN

## 2021-04-09 NOTE — Progress Notes (Signed)
Subjective:  Patient ID: Marcus Hicks, male    DOB: 1966-03-10  Age: 55 y.o. MRN: [id number]  Chief Complaint  Patient presents with  . Diabetes  . Hyperlipidemia  . Hypertension    HPI: chronic visit  Patient present with type 2 diabetes.  Specifically, this is type 2, insulin requiring diabetes, complicated by polyneuropathy.  Compliance with treatment has been good; patient take medicines as directed, maintains diet and exercise regimen, follows up as directed, and is keeping glucose diary.  Date of  diagnosis 2010.  Depression screen has been performed.Tobacco screen nonsmoker. Current medicines for diabetes metformin, tresiba 62, victoza 1.2 mg, .  Patient is on valsartan for renal protection and atorvastatin for cholesterol control.  Patient performs foot exams daily and last ophthalmologic exam was no.  Patient presents for follow up of hypertension.  Patient tolerating amlodipine, vasotec, metoprolol well with side effects.  Patient was diagnosed with hypertension 2010 so has been treated for hypertension for 10 years.Patient is working on maintaining diet and exercise regimen and follows up as directed. Complication include none.  Patient presents with hyperlipidemia.  Compliance with treatment has been good; patient takes medicines as directed, maintains low cholesterol diet, follows up as directed, and maintains exercise regimen.  Patient is using atorvastatin without problems.   Current Outpatient Medications on File Prior to Visit  Medication Sig Dispense Refill  . albuterol (PROVENTIL) (2.5 MG/3ML) 0.083% nebulizer solution Take 3 mLs (2.5 mg total) by nebulization every 6 (six) hours as needed for wheezing or shortness of breath. 150 mL 3  . amLODipine (NORVASC) 5 MG tablet Take 1 tablet (5 mg total) by mouth every other day. 45 tablet 3  . atorvastatin (LIPITOR) 40 MG tablet TAKE 1 TABLET BY MOUTH EVERY DAY 90 tablet 2  . BD PEN NEEDLE NANO 2ND GEN 32G X 4 MM MISC USE  AS DIRECTED ONCE DAILY 100 each 4  . ELIQUIS 5 MG TABS tablet TAKE 1 TABLET(5 MG) BY MOUTH TWICE DAILY 60 tablet 5  . glucose blood (CONTOUR NEXT TEST) test strip 1 each by Other route as needed for other. Use as instructed 100 each 4  . metFORMIN (GLUCOPHAGE) 500 MG tablet Take 1 tablet (500 mg total) by mouth 2 (two) times daily with a meal. 180 tablet 2  . metoprolol succinate (TOPROL-XL) 100 MG 24 hr tablet Take 1 tablet (100 mg total) by mouth daily. 90 tablet 3  . Microlet Lancets MISC USE TO CHECK BLOOD SUGAR EVERY MORNING AND AFTER MEALS    . sacubitril-valsartan (ENTRESTO) 49-51 MG Take 1 tablet by mouth 2 (two) times daily. 180 tablet 3  . torsemide (DEMADEX) 20 MG tablet Take 1 tablet (20 mg total) by mouth 2 (two) times daily. 180 tablet 3  . traMADol (ULTRAM) 50 MG tablet Take 100 mg by mouth every 6 (six) hours as needed for severe pain.     . TRESIBA FLEXTOUCH 100 UNIT/ML FlexTouch Pen ADMINISTER 62 UNITS UNDER THE SKIN DAILY 18 mL 6  . umeclidinium-vilanterol (ANORO ELLIPTA) 62.5-25 MCG/INH AEPB Inhale 1 puff into the lungs daily.    Marland Kitchen VICTOZA 18 MG/3ML SOPN ADMINISTER 1.2 MG UNDER THE SKIN DAILY 12 mL 6   No current facility-administered medications on file prior to visit.   Past Medical History:  Diagnosis Date  . Atrial fibrillation with rapid ventricular response (HCC) 08/12/2020  . Bicuspid aortic valve   . Chronic hepatitis C virus infection (HCC) 08/03/2013  . COPD (chronic obstructive  pulmonary disease) (HCC)   . Diabetes mellitus type 2, insulin dependent (HCC)   . Essential hypertension 07/03/2015   Last Assessment & Plan:  Blood pressure is borderline high.  Advised to get better sleep, walk on a regular basis, decrease weight down to about 210 and maintain, and eliminate tobacco.  . HFrEF (heart failure with reduced ejection fraction) (HCC) 08/22/2020  . Long-term insulin use (HCC) 10/12/2016  . Obesity, unspecified 10/12/2016   Patient advised to lose down to about  210 pounds and maintained.  Regular walking program recommended.  He knows how to lose weight.  . Sleep apnea    Past Surgical History:  Procedure Laterality Date  . CARDIOVERSION N/A 09/24/2020   Procedure: CARDIOVERSION;  Surgeon: [name], MD;  Location: [facility] ENDOSCOPY;  Service: Cardiovascular;  Laterality: N/A;  . CCTA  08/2019  . TEE WITHOUT CARDIOVERSION  09/24/2020   Procedure: TRANSESOPHAGEAL ECHOCARDIOGRAM (TEE);  Surgeon: [name], MD;  Location: [facility] ENDOSCOPY;  Service: Cardiovascular;;    Family History  Problem Relation Age of Onset  . Cancer Maternal Grandfather   . Liver disease Sister   . Alzheimer's disease Other   . Memory loss Other   . Hypertension Other    Social History   Socioeconomic History  . Marital status: Widowed    Spouse name: Not on file  . Number of children: Not on file  . Years of education: Not on file  . Highest education level: Not on file  Occupational History  . Not on file  Tobacco Use  . Smoking status: Former Smoker    Quit date: 2007    Years since quitting: 15.3  . Smokeless tobacco: Current User    Types: Snuff  Vaping Use  . Vaping Use: Never used  Substance and Sexual Activity  . Alcohol use: Not Currently  . Drug use: Never  . Sexual activity: Not on file  Other Topics Concern  . Not on file  Social History Narrative  . Not on file   Social Determinants of Health   Financial Resource Strain: Not on file  Food Insecurity: Not on file  Transportation Needs: Not on file  Physical Activity: Not on file  Stress: Not on file  Social Connections: Not on file    Review of Systems  Constitutional: Negative for activity change and appetite change.  HENT: Negative for congestion and rhinorrhea.   Eyes: Negative for visual disturbance.  Respiratory: Negative for chest tightness and shortness of breath.   Cardiovascular: Negative for chest pain, palpitations and leg swelling.  Endocrine:  Negative for polyuria.  Genitourinary: Negative for difficulty urinating, enuresis and urgency.  Neurological: Negative.      Objective:  BP 90/60   Pulse 78   Temp (!) 95.8 F (35.4 C)   Resp 16   Ht 5\' 5"  (1.651 m)   Wt 221 lb (100.2 kg)   SpO2 95%   BMI 36.78 kg/m   BP/Weight 04/09/2021 02/20/2021 01/28/2021  Systolic BP 90 90 100  Diastolic BP 60 70 70  Wt. (Lbs) 221 224 220  BMI 36.78 37.28 36.61    Physical Exam Vitals reviewed.  Constitutional:      Appearance: Normal appearance. He is obese.  HENT:     Head: Normocephalic.     Right Ear: Tympanic membrane, ear canal and external ear normal.     Left Ear: Tympanic membrane, ear canal and external ear normal.     Nose: Nose normal.  Mouth/Throat:     Mouth: Mucous membranes are moist.     Pharynx: Oropharynx is clear.  Eyes:     Extraocular Movements: Extraocular movements intact.     Conjunctiva/sclera: Conjunctivae normal.     Pupils: Pupils are equal, round, and reactive to light.  Cardiovascular:     Rate and Rhythm: Normal rate and regular rhythm.     Pulses: Normal pulses.     Heart sounds: No murmur heard. No gallop.   Pulmonary:     Effort: Pulmonary effort is normal. No respiratory distress.     Breath sounds: Normal breath sounds. No rales.  Abdominal:     General: Abdomen is flat. Bowel sounds are normal. There is no distension.     Tenderness: There is no abdominal tenderness.  Musculoskeletal:        General: Normal range of motion.     Cervical back: Normal range of motion and neck supple.     Right lower leg: No edema.     Left lower leg: No edema.  Skin:    General: Skin is warm.     Capillary Refill: Capillary refill takes less than 2 seconds.  Neurological:     General: No focal deficit present.     Mental Status: He is alert and oriented to person, place, and time. Mental status is at baseline.  Psychiatric:        Mood and Affect: Mood normal.        Thought Content: Thought  content normal.        Judgment: Judgment normal.     Diabetic Foot Exam - Simple   Simple Foot Form Diabetic Foot exam was performed with the following findings: Yes 04/09/2021  2:23 PM  Visual Inspection No deformities, no ulcerations, no other skin breakdown bilaterally: Yes Sensation Testing See comments: Yes Pulse Check Posterior Tibialis and Dorsalis pulse intact bilaterally: Yes Comments Decreased sensation feet      Lab Results  Component Value Date   WBC 8.6 02/20/2021   HGB 16.4 02/20/2021   HCT 47.6 02/20/2021   PLT 206 02/20/2021   GLUCOSE 94 02/20/2021   CHOL 162 11/26/2020   TRIG 111 11/26/2020   HDL 35 (L) 11/26/2020   LDLCALC 107 (H) 11/26/2020   ALT 46 (H) 02/20/2021   AST 34 02/20/2021   NA 143 02/20/2021   K 4.1 02/20/2021   CL 94 (L) 02/20/2021   CREATININE 0.98 02/20/2021   BUN 10 02/20/2021   CO2 29 02/20/2021   HGBA1C 6.4 (H) 11/26/2020   MICROALBUR 30 04/09/2021      Assessment & Plan:   1. Essential hypertension An individual hypertension care plan was established and reinforced today.  The patient's status was assessed using clinical findings on exam and labs or diagnostic tests. The patient's success at meeting treatment goals on disease specific evidence-based guidelines and found to be well controlled. SELF MANAGEMENT: The patient and I together assessed ways to personally work towards obtaining the recommended goals. RECOMMENDATIONS: avoid decongestants found in common cold remedies, decrease consumption of alcohol, perform routine monitoring of BP with home BP cuff, exercise, reduction of dietary salt, take medicines as prescribed, try not to miss doses and quit smoking.  Regular exercise and maintaining a healthy weight is needed.  Stress reduction may help. A CLINICAL SUMMARY including written plan identify barriers to care unique to individual due to social or financial issues.  We attempt to mutually creat solutions for individual  and family  understanding.  2. HFrEF (heart failure with reduced ejection fraction) (HCC) An individualized care plan was established and reinforced.  The patient's disease status was assessed using clinical finding son exam today, labs, and/or other diagnostic testing such as x-rays, to determine the patient's success in meeting treatmentgoalsbased on disease-based guidelines and found to beimproving. But not at goal yet. Medications prescriptions no changes Laboratory tests ordered to be performed today include routine. RECOMMENDATIONS: given include see cardiology.  Call physician is patient gains 3 lbs in one day or 5 lbs for one week.  Call for progressive PND, orthopnea or increased pedal edema. Las EF was 40 to 45%  3. Persistent atrial fibrillation [facility]) Patient has a diagnosis of permanent atrial fibrillation.   Patient is on eliquis and has controlled ventricular response.  Patient is CV stable.  4. Chronic obstructive pulmonary disease, unspecified COPD type (HCC) An individualize plan was formulated for care of COPD.  Treatment is evidence based.  She will continue on inhalers, avoid smoking and smoke.  Regular exercise with help with dyspnea. Routine follow ups and medication compliance is needed.  5. Obstructive sleep apnea syndrome Patient using cpap every night  6. Type 2 diabetes mellitus with diabetic polyneuropathy, unspecified whether long term insulin use (HCC) An individual care plan for diabetes was established and reinforced today.  The patient's status was assessed using clinical findings on exam, labs and diagnostic testing. Patient success at meeting goals based on disease specific evidence-based guidelines and found to be fair controlled. Medications were assessed and patient's understanding of the medical issues , including barriers were assessed. Recommend adherence to a diabetic diet, a graduated exercise program, HgbA1c level is checked quarterly, and urine  microalbumin performed yearly .  Annual mono-filament sensation testing performed. Lower blood pressure and control hyperlipidemia is important. Get annual eye exams and annual flu shots and smoking cessation discussed.  Self management goals were discussed.  7. Mixed hyperlipidemia AN INDIVIDUAL CARE PLAN for hyperlipidemia/ cholesterol was established and reinforced today.  The patient's status was assessed using clinical findings on exam, lab and other diagnostic tests. The patient's disease status was assessed based on evidence-based guidelines and found to be fair controlled. MEDICATIONS were reviewed. SELF MANAGEMENT GOALS have been discussed and patient's success at attaining the goal of low cholesterol was assessed. RECOMMENDATION given include regular exercise 3 days a week and low cholesterol/low fat diet. CLINICAL SUMMARY including written plan to identify barriers unique to the patient due to social or economic  reasons was discussed.  8. BMI 36.0-36.9,adult An individualize plan was formulated for obesity using patient history and physical exam to encourage weight loss.  An evidence based program was formulated.  Patient is to cut portion size with meals and to plan physical exercise 3 days a week at least 20 minutes.  Weight watchers and other programs are helpful.  Planned amount of weight loss 10 lbs. With diabetes and cardiac disease, he meets criteria for morbid obesity  9. Morbid obesity (HCC) An individualize plan was formulated for obesity using patient history and physical exam to encourage weight loss.  An evidence based program was formulated.  Patient is to cut portion size with meals and to plan physical exercise 3 days a week at least 20 minutes.  Weight watchers and other programs are helpful.  Planned amount of weight loss 10 lbs.   35 minute visit      Follow-up: Return in about 4 months (around 08/10/2021) for fastng.  An After  Visit Summary was printed and given  to the patient.  Brent BullaLawrence Sheilia Reznick, MD [facility] [phone]

## 2021-04-10 LAB — LIPID PANEL
Chol/HDL Ratio: 5.1 ratio — ABNORMAL HIGH (ref 0.0–5.0)
Cholesterol, Total: 190 mg/dL (ref 100–199)
HDL: 37 mg/dL — ABNORMAL LOW (ref >39)
LDL Chol Calc (NIH): 115 mg/dL — ABNORMAL HIGH (ref 0–99)
Triglycerides: 215 mg/dL — ABNORMAL HIGH (ref 0–149)
VLDL Cholesterol Cal: 38 mg/dL (ref 5–40)

## 2021-04-10 LAB — COMPREHENSIVE METABOLIC PANEL
ALT: 30 IU/L (ref 0–44)
AST: 32 IU/L (ref 0–40)
Albumin/Globulin Ratio: 2.3 — ABNORMAL HIGH (ref 1.2–2.2)
Albumin: 5.2 g/dL — ABNORMAL HIGH (ref 3.8–4.9)
Alkaline Phosphatase: 60 IU/L (ref 44–121)
BUN/Creatinine Ratio: 16 (ref 9–20)
BUN: 16 mg/dL (ref 6–24)
Bilirubin Total: 0.6 mg/dL (ref 0.0–1.2)
CO2: 27 mmol/L (ref 20–29)
Calcium: 9.6 mg/dL (ref 8.7–10.2)
Chloride: 93 mmol/L — ABNORMAL LOW (ref 96–106)
Creatinine, Ser: 0.99 mg/dL (ref 0.76–1.27)
Globulin, Total: 2.3 g/dL (ref 1.5–4.5)
Glucose: 117 mg/dL — ABNORMAL HIGH (ref 65–99)
Potassium: 4.2 mmol/L (ref 3.5–5.2)
Sodium: 140 mmol/L (ref 134–144)
Total Protein: 7.5 g/dL (ref 6.0–8.5)
eGFR: 89 mL/min/{1.73_m2} (ref >59)

## 2021-04-10 LAB — CBC WITH DIFFERENTIAL/PLATELET
Basophils Absolute: 0.1 10*3/uL (ref 0.0–0.2)
Basos: 1 %
EOS (ABSOLUTE): 0.2 10*3/uL (ref 0.0–0.4)
Eos: 2 %
Hematocrit: 46.3 % (ref 37.5–51.0)
Hemoglobin: 16.1 g/dL (ref 13.0–17.7)
Immature Grans (Abs): 0.1 10*3/uL (ref 0.0–0.1)
Immature Granulocytes: 1 %
Lymphocytes Absolute: 1.9 10*3/uL (ref 0.7–3.1)
Lymphs: 22 %
MCH: 31.5 pg (ref 26.6–33.0)
MCHC: 34.8 g/dL (ref 31.5–35.7)
MCV: 91 fL (ref 79–97)
Monocytes Absolute: 0.7 10*3/uL (ref 0.1–0.9)
Monocytes: 8 %
Neutrophils Absolute: 5.6 10*3/uL (ref 1.4–7.0)
Neutrophils: 66 %
Platelets: 228 10*3/uL (ref 150–450)
RBC: 5.11 x10E6/uL (ref 4.14–5.80)
RDW: 13 % (ref 11.6–15.4)
WBC: 8.4 10*3/uL (ref 3.4–10.8)

## 2021-04-10 LAB — CARDIOVASCULAR RISK ASSESSMENT

## 2021-04-10 LAB — HEMOGLOBIN A1C
Est. average glucose Bld gHb Est-mCnc: 166 mg/dL
Hgb A1c MFr Bld: 7.4 % — ABNORMAL HIGH (ref 4.8–5.6)

## 2021-04-11 NOTE — Progress Notes (Signed)
CBC normal, glucose 117, kidney tests normal, liver tests normal, A1c 7.4 good, Cholesterol LDL 115 high and triglycerides high- is he taking atorvastatin regularly? lp

## 2021-04-18 ENCOUNTER — Ambulatory Visit (INDEPENDENT_AMBULATORY_CARE_PROVIDER_SITE_OTHER): Payer: 59 | Admitting: Cardiology

## 2021-04-18 ENCOUNTER — Other Ambulatory Visit: Payer: Self-pay

## 2021-04-18 ENCOUNTER — Encounter: Payer: Self-pay | Admitting: Cardiology

## 2021-04-18 VITALS — BP 136/82 | HR 92 | Ht 65.0 in | Wt 218.6 lb

## 2021-04-18 DIAGNOSIS — I5020 Unspecified systolic (congestive) heart failure: Secondary | ICD-10-CM

## 2021-04-18 DIAGNOSIS — I5022 Chronic systolic (congestive) heart failure: Secondary | ICD-10-CM

## 2021-04-18 DIAGNOSIS — I110 Hypertensive heart disease with heart failure: Secondary | ICD-10-CM

## 2021-04-18 DIAGNOSIS — Q2381 Bicuspid aortic valve: Secondary | ICD-10-CM

## 2021-04-18 DIAGNOSIS — I4819 Other persistent atrial fibrillation: Secondary | ICD-10-CM

## 2021-04-18 DIAGNOSIS — Z7901 Long term (current) use of anticoagulants: Secondary | ICD-10-CM | POA: Diagnosis not present

## 2021-04-18 DIAGNOSIS — Q231 Congenital insufficiency of aortic valve: Secondary | ICD-10-CM

## 2021-04-18 DIAGNOSIS — E782 Mixed hyperlipidemia: Secondary | ICD-10-CM

## 2021-04-18 DIAGNOSIS — I2510 Atherosclerotic heart disease of native coronary artery without angina pectoris: Secondary | ICD-10-CM

## 2021-04-18 DIAGNOSIS — K7460 Unspecified cirrhosis of liver: Secondary | ICD-10-CM

## 2021-04-18 NOTE — Patient Instructions (Signed)

## 2021-04-18 NOTE — Progress Notes (Signed)
Cardiology Office Note:    Date:  04/18/2021   ID:  Marcus Hicks, DOB June 03, 1966, MRN [id number]  PCP:  Abigail Miyamoto, MD  Cardiologist:  Norman Herrlich, MD    Referring MD: Abigail Miyamoto,*    ASSESSMENT:    1. Persistent atrial fibrillation (HCC)   2. Chronic anticoagulation   3. Mild CAD   4. HFrEF (heart failure with reduced ejection fraction) (HCC)   5. Bicuspid aortic valve   6. Mixed hyperlipidemia   7. Cirrhosis of liver without ascites, unspecified hepatic cirrhosis type (HCC)   8. Hypertensive heart disease with chronic systolic congestive heart failure (HCC)    PLAN:    In order of problems listed above:  1. Stable rate controlled continue with beta-blocker and his anticoagulant. 2. Increased risk bleeding complications with liver disease refer to Dr. Lalla Brothers EP for consideration of watchman patient's request 3. Stable CAD no chest discomfort continue medical therapy including beta-blocker lipid-lowering 4. Compensated heart failure no edema continue his current loop diuretic Entresto beta-blocker recheck echocardiogram EF aortic regurgitation. 5. Continue with statin 6. Stable increased risk of bleeding consider watchman device 7. BP at target continue current treatment including amlodipine   Next appointment: 6 months   Medication Adjustments/Labs and Tests Ordered: Current medicines are reviewed at length with the patient today.  Concerns regarding medicines are outlined above.  Orders Placed This Encounter  Procedures  . Ambulatory referral to Cardiac Electrophysiology  . ECHOCARDIOGRAM COMPLETE   No orders of the defined types were placed in this encounter.   Chief Complaint  Patient presents with  . Follow-up  . Atrial Fibrillation  . Anticoagulation  . Congestive Heart Failure    History of Present Illness:    Marcus Hicks is a 55 y.o. male with a hx of persistent atrial fibrillation rate controlled with a  beta-blocker and anticoagulated with Eliquis heart failure reduced ejection fraction mild CAD hyperlipidemia hepatic cirrhosis and a bicuspid aortic valve last seen 02/20/2021..Cardiac CTA September 2020 showed mild 25% LAD stenosis echocardiogram showed ejection fraction time 35 to 40% and he had a bicuspid aortic valve with moderate aortic regurgitation.  His echocardiogram at [facility] 08/12/2020 had  aortic stenosis mean gradient 24 mmHg at that time the valve is described as trileaflet with normal excursion and there is no aortic regurgitation.  TEE was performed 09/24/2020 EF 35% aortic valve was bicuspid there is mild regurgitation I reviewed the images the valve was not restricted.   Compliance with diet, lifestyle and medications: Yes  In general he is doing better Tolerates his anticoagulant without bleeding complication but is worried about his risk with his liver disease. He will be seeing Dr. Lalla Brothers to discuss watchman device He is not having angina shortness of breath edema palpitation or syncope.  Recent labs with his primary care physician showed a total cholesterol 190 triglycerides 215 LDL 115 HDL 37 A1c is mildly elevated 7.4% creatinine normal 0.99 Past Medical History:  Diagnosis Date  . Atrial fibrillation with rapid ventricular response (HCC) 08/12/2020  . Bicuspid aortic valve   . Chronic hepatitis C virus infection (HCC) 08/03/2013  . COPD (chronic obstructive pulmonary disease) (HCC)   . Diabetes mellitus type 2, insulin dependent (HCC)   . Essential hypertension 07/03/2015   Last Assessment & Plan:  Blood pressure is borderline high.  Advised to get better sleep, walk on a regular basis, decrease weight down to about 210 and maintain, and eliminate tobacco.  .  HFrEF (heart failure with reduced ejection fraction) (HCC) 08/22/2020  . Long-term insulin use (HCC) 10/12/2016  . Obesity, unspecified 10/12/2016   Patient advised to lose down to about 210 pounds and  maintained.  Regular walking program recommended.  He knows how to lose weight.  . Sleep apnea     Past Surgical History:  Procedure Laterality Date  . CARDIOVERSION N/A 09/24/2020   Procedure: CARDIOVERSION;  Surgeon: [name], MD;  Location: [facility] ENDOSCOPY;  Service: Cardiovascular;  Laterality: N/A;  . CCTA  08/2019  . TEE WITHOUT CARDIOVERSION  09/24/2020   Procedure: TRANSESOPHAGEAL ECHOCARDIOGRAM (TEE);  Surgeon: [name], MD;  Location: [facility] ENDOSCOPY;  Service: Cardiovascular;;    Current Medications: Current Meds  Medication Sig  . albuterol (PROVENTIL) (2.5 MG/3ML) 0.083% nebulizer solution Take 3 mLs (2.5 mg total) by nebulization every 6 (six) hours as needed for wheezing or shortness of breath.  . ALPRAZolam (XANAX) 0.5 MG tablet Take 1 tablet (0.5 mg total) by mouth 3 (three) times daily as needed for anxiety.  Marland Kitchen amLODipine (NORVASC) 5 MG tablet Take 1 tablet (5 mg total) by mouth every other day.  Marland Kitchen atorvastatin (LIPITOR) 40 MG tablet TAKE 1 TABLET BY MOUTH EVERY DAY  . BD PEN NEEDLE NANO 2ND GEN 32G X 4 MM MISC USE AS DIRECTED ONCE DAILY  . ELIQUIS 5 MG TABS tablet TAKE 1 TABLET(5 MG) BY MOUTH TWICE DAILY  . glucose blood (CONTOUR NEXT TEST) test strip 1 each by Other route as needed for other. Use as instructed  . metFORMIN (GLUCOPHAGE) 500 MG tablet Take 1 tablet (500 mg total) by mouth 2 (two) times daily with a meal.  . metoprolol succinate (TOPROL-XL) 100 MG 24 hr tablet Take 1 tablet (100 mg total) by mouth daily.  . Microlet Lancets MISC USE TO CHECK BLOOD SUGAR EVERY MORNING AND AFTER MEALS  . sacubitril-valsartan (ENTRESTO) 49-51 MG Take 1 tablet by mouth 2 (two) times daily.  Marland Kitchen torsemide (DEMADEX) 20 MG tablet Take 1 tablet (20 mg total) by mouth 2 (two) times daily.  . traMADol (ULTRAM) 50 MG tablet Take 100 mg by mouth every 6 (six) hours as needed for severe pain.   . TRESIBA FLEXTOUCH 100 UNIT/ML FlexTouch Pen ADMINISTER 62 UNITS  UNDER THE SKIN DAILY  . umeclidinium-vilanterol (ANORO ELLIPTA) 62.5-25 MCG/INH AEPB Inhale 1 puff into the lungs daily.  Marland Kitchen VICTOZA 18 MG/3ML SOPN ADMINISTER 1.2 MG UNDER THE SKIN DAILY  . zolpidem (AMBIEN) 10 MG tablet Take 1 tablet (10 mg total) by mouth at bedtime as needed for sleep.     Allergies:   Patient has no known allergies.   Social History   Socioeconomic History  . Marital status: Widowed    Spouse name: Not on file  . Number of children: Not on file  . Years of education: Not on file  . Highest education level: Not on file  Occupational History  . Not on file  Tobacco Use  . Smoking status: Former Smoker    Quit date: 2007    Years since quitting: 15.3  . Smokeless tobacco: Current User    Types: Snuff  Vaping Use  . Vaping Use: Never used  Substance and Sexual Activity  . Alcohol use: Not Currently  . Drug use: Never  . Sexual activity: Not on file  Other Topics Concern  . Not on file  Social History Narrative  . Not on file   Social Determinants of Health   Financial  Resource Strain: Not on file  Food Insecurity: Not on file  Transportation Needs: Not on file  Physical Activity: Not on file  Stress: Not on file  Social Connections: Not on file     Family History: The patient's family history includes Alzheimer's disease in an other family member; Cancer in his maternal grandfather; Hypertension in an other family member; Liver disease in his sister; Memory loss in an other family member. ROS:   Please see the history of present illness.    All other systems reviewed and are negative.  EKGs/Labs/Other Studies Reviewed:    The following studies were reviewed today:    Recent Labs: 02/20/2021: NT-Pro BNP 407 04/09/2021: ALT 30; BUN 16; Creatinine, Ser 0.99; Hemoglobin 16.1; Platelets 228; Potassium 4.2; Sodium 140  Recent Lipid Panel    Component Value Date/Time   CHOL 190 04/09/2021 1433   TRIG 215 (H) 04/09/2021 1433   HDL 37 (L)  04/09/2021 1433   CHOLHDL 5.1 (H) 04/09/2021 1433   LDLCALC 115 (H) 04/09/2021 1433    Physical Exam:    VS:  BP 136/82 (BP Location: Right Arm, Patient Position: Sitting, Cuff Size: Normal)   Pulse 92   Ht 5\' 5"  (1.651 m)   Wt 218 lb 9.6 oz (99.2 kg)   SpO2 97%   BMI 36.38 kg/m     Wt Readings from Last 3 Encounters:  04/18/21 218 lb 9.6 oz (99.2 kg)  04/09/21 221 lb (100.2 kg)  02/20/21 224 lb (101.6 kg)     GEN:  Well nourished, well developed in no acute distress HEENT: Normal NECK: No JVD; No carotid bruits LYMPHATICS: No lymphadenopathy CARDIAC: Irregular rate and rhythm  no murmurs, rubs, gallops RESPIRATORY:  Clear to auscultation without rales, wheezing or rhonchi  ABDOMEN: Soft, non-tender, non-distended MUSCULOSKELETAL:  No edema; No deformity  SKIN: Warm and dry NEUROLOGIC:  Alert and oriented x 3 PSYCHIATRIC:  Normal affect    Signed, 02/22/21, MD  04/18/2021 3:31 PM    [facility]

## 2021-05-08 ENCOUNTER — Institutional Professional Consult (permissible substitution): Payer: 59 | Admitting: Cardiology

## 2021-05-13 ENCOUNTER — Other Ambulatory Visit: Payer: Self-pay

## 2021-05-13 ENCOUNTER — Ambulatory Visit (INDEPENDENT_AMBULATORY_CARE_PROVIDER_SITE_OTHER): Payer: 59

## 2021-05-13 DIAGNOSIS — I4819 Other persistent atrial fibrillation: Secondary | ICD-10-CM

## 2021-05-13 LAB — ECHOCARDIOGRAM COMPLETE
AR max vel: 0.59 cm2
AV Area VTI: 0.6 cm2
AV Area mean vel: 0.53 cm2
AV Mean grad: 29.8 mmHg
AV Peak grad: 45.9 mmHg
Ao pk vel: 3.39 m/s
Area-P 1/2: 3.89 cm2
Calc EF: 54.4 %
S' Lateral: 3.7 cm
Single Plane A2C EF: 56.7 %
Single Plane A4C EF: 56.6 %

## 2021-05-13 NOTE — Progress Notes (Signed)
Complete echocardiogram performed.  Jimmy Natashia Roseman RDCS, RVT  

## 2021-05-14 ENCOUNTER — Telehealth: Payer: Self-pay

## 2021-05-14 DIAGNOSIS — I350 Nonrheumatic aortic (valve) stenosis: Secondary | ICD-10-CM

## 2021-05-14 NOTE — Telephone Encounter (Signed)
Spoke with patient regarding results and recommendation.  Patient verbalizes understanding and is agreeable to plan of care. Advised patient to call back with any issues or concerns.  

## 2021-05-14 NOTE — Telephone Encounter (Signed)
-----   Message from Baldo Daub, MD sent at 05/14/2021 12:24 PM EDT ----- I reviewed his echocardiogram his heart muscle function is stable mildly to moderately reduced.  I am concerned about the severity of the blockage of his aortic valve aortic stenosis it appears to be becoming severe I would like him to have a calcium score to define the aortic valve calcification score for aortic stenosis. Repeat echocardiogram schedule 6 months

## 2021-05-16 ENCOUNTER — Ambulatory Visit (INDEPENDENT_AMBULATORY_CARE_PROVIDER_SITE_OTHER)
Admission: RE | Admit: 2021-05-16 | Discharge: 2021-05-16 | Disposition: A | Discharge status: Home / Self Care | Payer: Self-pay | Source: Ambulatory Visit | Attending: Cardiology | Admitting: Cardiology

## 2021-05-16 ENCOUNTER — Other Ambulatory Visit: Payer: Self-pay

## 2021-05-16 DIAGNOSIS — Q231 Congenital insufficiency of aortic valve: Secondary | ICD-10-CM

## 2021-05-19 ENCOUNTER — Telehealth: Payer: Self-pay

## 2021-05-19 NOTE — Telephone Encounter (Signed)
Spoke with patient regarding results and recommendation.  Patient verbalizes understanding and is agreeable to plan of care. Advised patient to call back with any issues or concerns.  

## 2021-05-19 NOTE — Telephone Encounter (Signed)
-----   Message from Baldo Daub, MD sent at 05/18/2021  5:32 PM EDT ----- His aortic valve calcium score is not severely elevated at this time does not have severe stenosis.

## 2021-06-01 ENCOUNTER — Encounter (HOSPITAL_COMMUNITY): Payer: Self-pay | Admitting: Emergency Medicine

## 2021-06-01 ENCOUNTER — Emergency Department (HOSPITAL_COMMUNITY): Payer: 59

## 2021-06-01 ENCOUNTER — Other Ambulatory Visit: Payer: Self-pay

## 2021-06-01 ENCOUNTER — Inpatient Hospital Stay (HOSPITAL_COMMUNITY)
Admission: EM | Admit: 2021-06-01 | Discharge: 2021-06-01 | DRG: 682 | Discharge status: Against Medical Advice | Payer: 59 | Attending: Internal Medicine | Admitting: Internal Medicine

## 2021-06-01 DIAGNOSIS — E876 Hypokalemia: Secondary | ICD-10-CM | POA: Diagnosis present

## 2021-06-01 DIAGNOSIS — B182 Chronic viral hepatitis C: Secondary | ICD-10-CM | POA: Diagnosis present

## 2021-06-01 DIAGNOSIS — Z79899 Other long term (current) drug therapy: Secondary | ICD-10-CM

## 2021-06-01 DIAGNOSIS — Z794 Long term (current) use of insulin: Secondary | ICD-10-CM | POA: Diagnosis not present

## 2021-06-01 DIAGNOSIS — N179 Acute kidney failure, unspecified: Secondary | ICD-10-CM

## 2021-06-01 DIAGNOSIS — Z87891 Personal history of nicotine dependence: Secondary | ICD-10-CM

## 2021-06-01 DIAGNOSIS — Z8249 Family history of ischemic heart disease and other diseases of the circulatory system: Secondary | ICD-10-CM | POA: Diagnosis not present

## 2021-06-01 DIAGNOSIS — I4510 Unspecified right bundle-branch block: Secondary | ICD-10-CM | POA: Diagnosis present

## 2021-06-01 DIAGNOSIS — N320 Bladder-neck obstruction: Secondary | ICD-10-CM | POA: Diagnosis present

## 2021-06-01 DIAGNOSIS — Z5329 Procedure and treatment not carried out because of patient's decision for other reasons: Secondary | ICD-10-CM | POA: Diagnosis present

## 2021-06-01 DIAGNOSIS — Z820 Family history of epilepsy and other diseases of the nervous system: Secondary | ICD-10-CM | POA: Diagnosis not present

## 2021-06-01 DIAGNOSIS — K7460 Unspecified cirrhosis of liver: Secondary | ICD-10-CM | POA: Diagnosis present

## 2021-06-01 DIAGNOSIS — J449 Chronic obstructive pulmonary disease, unspecified: Secondary | ICD-10-CM | POA: Diagnosis present

## 2021-06-01 DIAGNOSIS — U071 COVID-19: Secondary | ICD-10-CM

## 2021-06-01 DIAGNOSIS — I5032 Chronic diastolic (congestive) heart failure: Secondary | ICD-10-CM | POA: Diagnosis present

## 2021-06-01 DIAGNOSIS — Z7901 Long term (current) use of anticoagulants: Secondary | ICD-10-CM | POA: Diagnosis not present

## 2021-06-01 DIAGNOSIS — I110 Hypertensive heart disease with heart failure: Secondary | ICD-10-CM | POA: Diagnosis present

## 2021-06-01 DIAGNOSIS — I4819 Other persistent atrial fibrillation: Secondary | ICD-10-CM | POA: Diagnosis present

## 2021-06-01 DIAGNOSIS — I10 Essential (primary) hypertension: Secondary | ICD-10-CM | POA: Diagnosis present

## 2021-06-01 DIAGNOSIS — E119 Type 2 diabetes mellitus without complications: Secondary | ICD-10-CM | POA: Diagnosis present

## 2021-06-01 DIAGNOSIS — I4891 Unspecified atrial fibrillation: Secondary | ICD-10-CM | POA: Diagnosis not present

## 2021-06-01 DIAGNOSIS — I959 Hypotension, unspecified: Secondary | ICD-10-CM

## 2021-06-01 HISTORY — DX: COVID-19: U07.1

## 2021-06-01 HISTORY — DX: Acute kidney failure, unspecified: N17.9

## 2021-06-01 LAB — CBC WITH DIFFERENTIAL/PLATELET
Abs Immature Granulocytes: 0.02 10*3/uL (ref 0.00–0.07)
Basophils Absolute: 0 10*3/uL (ref 0.0–0.1)
Basophils Relative: 0 %
Eosinophils Absolute: 0 10*3/uL (ref 0.0–0.5)
Eosinophils Relative: 0 %
HCT: 39.7 % (ref 39.0–52.0)
Hemoglobin: 13.8 g/dL (ref 13.0–17.0)
Immature Granulocytes: 0 %
Lymphocytes Relative: 19 %
Lymphs Abs: 1 10*3/uL (ref 0.7–4.0)
MCH: 31.7 pg (ref 26.0–34.0)
MCHC: 34.8 g/dL (ref 30.0–36.0)
MCV: 91.3 fL (ref 80.0–100.0)
Monocytes Absolute: 0.9 10*3/uL (ref 0.1–1.0)
Monocytes Relative: 16 %
Neutro Abs: 3.4 10*3/uL (ref 1.7–7.7)
Neutrophils Relative %: 65 %
Platelets: 202 10*3/uL (ref 150–400)
RBC: 4.35 MIL/uL (ref 4.22–5.81)
RDW: 12.4 % (ref 11.5–15.5)
WBC: 5.4 10*3/uL (ref 4.0–10.5)
nRBC: 0 % (ref 0.0–0.2)

## 2021-06-01 LAB — LIPASE, BLOOD: Lipase: 172 U/L — ABNORMAL HIGH (ref 11–51)

## 2021-06-01 LAB — BRAIN NATRIURETIC PEPTIDE: B Natriuretic Peptide: 85.2 pg/mL (ref 0.0–100.0)

## 2021-06-01 LAB — COMPREHENSIVE METABOLIC PANEL
ALT: 25 U/L (ref 0–44)
AST: 32 U/L (ref 15–41)
Albumin: 3.9 g/dL (ref 3.5–5.0)
Alkaline Phosphatase: 40 U/L (ref 38–126)
Anion gap: 16 — ABNORMAL HIGH (ref 5–15)
BUN: 66 mg/dL — ABNORMAL HIGH (ref 6–20)
CO2: 24 mmol/L (ref 22–32)
Calcium: 8.2 mg/dL — ABNORMAL LOW (ref 8.9–10.3)
Chloride: 94 mmol/L — ABNORMAL LOW (ref 98–111)
Creatinine, Ser: 3 mg/dL — ABNORMAL HIGH (ref 0.61–1.24)
GFR, Estimated: 24 mL/min — ABNORMAL LOW (ref >60)
Glucose, Bld: 91 mg/dL (ref 70–99)
Potassium: 3.2 mmol/L — ABNORMAL LOW (ref 3.5–5.1)
Sodium: 134 mmol/L — ABNORMAL LOW (ref 135–145)
Total Bilirubin: 1.4 mg/dL — ABNORMAL HIGH (ref 0.3–1.2)
Total Protein: 7.1 g/dL (ref 6.5–8.1)

## 2021-06-01 LAB — TROPONIN I (HIGH SENSITIVITY)
Troponin I (High Sensitivity): 9 ng/L (ref <18)
Troponin I (High Sensitivity): 9 ng/L (ref <18)

## 2021-06-01 LAB — HIV ANTIBODY (ROUTINE TESTING W REFLEX): HIV Screen 4th Generation wRfx: NONREACTIVE

## 2021-06-01 LAB — SARS CORONAVIRUS 2 (TAT 6-24 HRS): SARS Coronavirus 2: POSITIVE — AB

## 2021-06-01 MED ORDER — LACTATED RINGERS IV BOLUS
1000.0000 mL | Freq: Once | INTRAVENOUS | Status: AC
  Administered 2021-06-01: 1000 mL via INTRAVENOUS

## 2021-06-01 MED ORDER — ACETAMINOPHEN 325 MG PO TABS: 650.0000 mg | ORAL_TABLET | ORAL | Status: DC | PRN

## 2021-06-01 MED ORDER — SODIUM CHLORIDE 0.9% FLUSH
3.0000 mL | Freq: Two times a day (BID) | INTRAVENOUS | Status: DC
  Administered 2021-06-01: 3 mL via INTRAVENOUS

## 2021-06-01 MED ORDER — SODIUM CHLORIDE 0.9 % IV BOLUS
1000.0000 mL | Freq: Once | INTRAVENOUS | Status: AC
  Administered 2021-06-01: 1000 mL via INTRAVENOUS

## 2021-06-01 MED ORDER — SODIUM CHLORIDE 0.9 % IV BOLUS
500.0000 mL | Freq: Once | INTRAVENOUS | Status: AC
  Administered 2021-06-01: 500 mL via INTRAVENOUS

## 2021-06-01 MED ORDER — ONDANSETRON HCL 4 MG/2ML IJ SOLN: 4.0000 mg | Freq: Four times a day (QID) | INTRAMUSCULAR | Status: DC | PRN

## 2021-06-01 MED ORDER — POTASSIUM CHLORIDE CRYS ER 20 MEQ PO TBCR
40.0000 meq | EXTENDED_RELEASE_TABLET | ORAL | Status: DC
  Administered 2021-06-01: 40 meq via ORAL
  Filled 2021-06-01: qty 2

## 2021-06-01 MED ORDER — SODIUM CHLORIDE 0.9% FLUSH: 3.0000 mL | INTRAVENOUS | Status: DC | PRN

## 2021-06-01 MED ORDER — SODIUM CHLORIDE 0.9 % IV SOLN: 250.0000 mL | INTRAVENOUS | Status: DC | PRN

## 2021-06-01 MED ORDER — APIXABAN 5 MG PO TABS: 5.0000 mg | ORAL_TABLET | Freq: Two times a day (BID) | ORAL | Status: DC

## 2021-06-01 NOTE — ED Provider Notes (Signed)
Emergency Medicine Provider Triage Evaluation Note  Marcus Hicks , a 55 y.o. male  was evaluated in triage.  Pt complains of a-fib.  Compliant with anticoagulation.  Pt with 1 week of weakness, nausea and feeling unwell.  Was traveling to the [place] of [place].  Was seen by urgent care and referred to the ED.  Pt elected to come back home to get to the ED.  Labs with him show AKI, elevated BUN - no hx of same.    Review of Systems  Positive: Weakness, nausea Negative: CP, abd pain, syncope.  Physical Exam  BP (!) 85/64 (BP Location: Left Arm)   Pulse (!) 103   Temp 98.5 F (36.9 C) (Oral)   Resp 20   SpO2 98%  Gen:   Awake, no distress, ill appearing Resp:  Mild increased WOB MSK:   Moves extremities without difficulty  Other:  Abd soft and nontender, tachycardic  Medical Decision Making  Medically screening exam initiated at 1:24 AM.  Appropriate orders placed.  Marcus Hicks was informed that the remainder of the evaluation will be completed by another provider, this initial triage assessment does not replace that evaluation, and the importance of remaining in the ED until their evaluation is complete.  Patient here with A. fib.  Tachycardic, hypotensive.  Generalized weakness, nausea without vomiting. AKI.  Fluids ordered. Pt needs the next room.   Kaybree Williams, Boyd Kerbs 06/01/21 0127    Gilda Crease, MD 06/01/21 [phone]

## 2021-06-01 NOTE — ED Notes (Signed)
Pt refusing foley catheter. MD made aware.

## 2021-06-01 NOTE — ED Provider Notes (Signed)
[facility]-EMERGENCY DEPT [facility] Emergency Department Provider Note MRN:  [id number]  Arrival date & time: 06/01/21     Chief Complaint   Shortness of Breath and Atrial Fibrillation   History of Present Illness   Marcus Hicks is a 55 y.o. year-old male with a history of A. fib, COPD, diabetes, heart failure presenting to the ED with chief complaint of shortness of breath and fatigue.  Patient has been feeling generally unwell with low energy over the past several days.  Not eating or drinking much of anything.  [place] stuff together to leave but was feeling too tired to do so.  When he finally got packed, he realized that he did not feel well enough to drive and so he called his daughters who came to get him.  On arrival he is hypotensive, in A. fib with RVR.  Denies any chest pain, endorsing some shortness of breath and dyspnea on exertion.  Review of Systems  A complete 10 system review of systems was obtained and all systems are negative except as noted in the HPI and PMH.   Patient's Health History    Past Medical History:  Diagnosis Date   Atrial fibrillation with rapid ventricular response (HCC) 08/12/2020   Bicuspid aortic valve    Chronic hepatitis C virus infection (HCC) 08/03/2013   COPD (chronic obstructive pulmonary disease) (HCC)    Diabetes mellitus type 2, insulin dependent (HCC)    Essential hypertension 07/03/2015   Last Assessment & Plan:  Blood pressure is borderline high.  Advised to get better sleep, walk on a regular basis, decrease weight down to about 210 and maintain, and eliminate tobacco.   HFrEF (heart failure with reduced ejection fraction) (HCC) 08/22/2020   Long-term insulin use (HCC) 10/12/2016   Obesity, unspecified 10/12/2016   Patient advised to lose down to about 210 pounds and maintained.  Regular walking program recommended.  He knows how to lose weight.   Sleep apnea     Past Surgical History:   Procedure Laterality Date   CARDIOVERSION N/A 09/24/2020   Procedure: CARDIOVERSION;  Surgeon: [name], MD;  Location: [facility] ENDOSCOPY;  Service: Cardiovascular;  Laterality: N/A;   CCTA  08/2019   TEE WITHOUT CARDIOVERSION  09/24/2020   Procedure: TRANSESOPHAGEAL ECHOCARDIOGRAM (TEE);  Surgeon: [name], MD;  Location: [facility] ENDOSCOPY;  Service: Cardiovascular;;    Family History  Problem Relation Age of Onset   Cancer Maternal Grandfather    Liver disease Sister    Alzheimer's disease Other    Memory loss Other    Hypertension Other     Social History   Socioeconomic History   Marital status: Widowed    Spouse name: Not on file   Number of children: Not on file   Years of education: Not on file   Highest education level: Not on file  Occupational History   Not on file  Tobacco Use   Smoking status: Former    Pack years: 0.00   Smokeless tobacco: Current    Types: Snuff  Vaping Use   Vaping Use: Never used  Substance and Sexual Activity   Alcohol use: Not Currently   Drug use: Never   Sexual activity: Not on file  Other Topics Concern   Not on file  Social History Narrative   Not on file   Social Determinants of Health   Financial Resource Strain: Not on file  Food Insecurity:  Not on file  Transportation Needs: Not on file  Physical Activity: Not on file  Stress: Not on file  Social Connections: Not on file  Intimate Partner Violence: Not on file     Physical Exam   Vitals:   06/01/21 0300 06/01/21 0410  BP: (!) 89/59 92/66  Pulse: 95 98  Resp: 14 18  Temp:    SpO2: 95% 95%    CONSTITUTIONAL: Well-appearing, NAD NEURO:  Alert and oriented x 3, no focal deficits EYES:  eyes equal and reactive ENT/NECK:  no LAD, no JVD CARDIO: Tachycardic rate, irregular rhythm, well-perfused, normal S1 and S2 PULM:  CTAB no wheezing or rhonchi GI/GU:  normal bowel sounds, non-distended, non-tender MSK/SPINE:  No gross deformities, no  edema SKIN:  no rash, atraumatic PSYCH:  Appropriate speech and behavior  *Additional and/or pertinent findings included in MDM below  Diagnostic and Interventional Summary    EKG Interpretation  Date/Time:  Sunday June 01 2021 01:16:32 EDT Ventricular Rate:  122 PR Interval:    QRS Duration: 96 QT Interval:  338 QTC Calculation: 481 R Axis:   102 Text Interpretation: Atrial fibrillation with rapid ventricular response Incomplete right bundle branch block Possible Right ventricular hypertrophy Abnormal ECG Confirmed by Kennis Carina [phone]) on 06/01/2021 1:45:28 AM        Labs Reviewed  COMPREHENSIVE METABOLIC PANEL - Abnormal; Notable for the following components:      Result Value   Sodium 134 (*)    Potassium 3.2 (*)    Chloride 94 (*)    BUN 66 (*)    Creatinine, Ser 3.00 (*)    Calcium 8.2 (*)    Total Bilirubin 1.4 (*)    GFR, Estimated 24 (*)    Anion gap 16 (*)    All other components within normal limits  LIPASE, BLOOD - Abnormal; Notable for the following components:   Lipase 172 (*)    All other components within normal limits  SARS CORONAVIRUS 2 (TAT 6-24 HRS)  CBC WITH DIFFERENTIAL/PLATELET  BRAIN NATRIURETIC PEPTIDE  TROPONIN I (HIGH SENSITIVITY)  TROPONIN I (HIGH SENSITIVITY)    CT ABDOMEN PELVIS WO CONTRAST  Final Result    DG Chest Port 1 View  Final Result      Medications  sodium chloride 0.9 % bolus 500 mL (500 mLs Intravenous New Bag/Given 06/01/21 0129)  sodium chloride 0.9 % bolus 1,000 mL (1,000 mLs Intravenous New Bag/Given 06/01/21 0152)     Procedures  /  Critical Care .Critical Care  Date/Time: 06/01/2021 2:03 AM Performed by: Sabas Sous, MD Authorized by: Sabas Sous, MD   Critical care provider statement:    Critical care time (minutes):  35   Critical care was necessary to treat or prevent imminent or life-threatening deterioration of the following conditions: A. fib with RVR.   Critical care was time spent personally  by me on the following activities:  Discussions with consultants, evaluation of patient's response to treatment, examination of patient, ordering and performing treatments and interventions, ordering and review of laboratory studies, ordering and review of radiographic studies, pulse oximetry, re-evaluation of patient's condition, obtaining history from patient or surrogate and review of old charts  ED Course and Medical Decision Making  I have reviewed the triage vital signs, the nursing notes, and pertinent available records from the EMR.  Listed above are laboratory and imaging tests that I personally ordered, reviewed, and interpreted and then considered in my medical decision making (see below for  details).  A. fib with RVR, also with some hypotension with systolics in the 80s.  Afebrile, on room air breathing comfortably, conversant.  Given the history of poor p.o. intake I suspect dehydration is a large contributor to this hypotension.  Nothing to suggest infection, no infectious symptoms.  Patient has persistent constant A. fib, has failed multiple cardioversions in the past.  He also missed a few doses of his Eliquis this week and so he is a poor cardioversion candidate unless he became severely unstable.  He is improving with fluids, will monitor closely.     CT abdomen obtained given patient's persistent nausea vomiting, poor appetite, lack of bowel movements, sensation of abdominal distention.  No signs of bowel obstruction but evidence of a distended bladder and possibly bladder outlet obstruction.  This could be contributing to patient's acute renal injury.  Will place Foley, admit to medicine.  Elmer Sow. Pilar Plate, MD [facility] Emergency Medicine [facility] [email or web address]  Final Clinical Impressions(s) / ED Diagnoses     ICD-10-CM   1. Atrial fibrillation with RVR (HCC)  I48.91     2. AKI (acute kidney injury) (HCC)  N17.9     3. Bladder outlet obstruction   N32.0       ED Discharge Orders     None        Discharge Instructions Discussed with and Provided to Patient:   Discharge Instructions   None       Sabas Sous, MD 06/01/21 [phone]

## 2021-06-01 NOTE — H&P (Signed)
History and Physical:    Marcus Hicks   SWH:[id number] DOB: 31-Dec-1965 DOA: 06/01/2021  Referring MD/provider: Rodman Comp PCP: Abigail Miyamoto, MD   Patient coming from: Home  Chief Complaint: Fatigue, shortness of breath  History of Present Illness:   Marcus Hicks is an 55 y.o. male with PMH significant for COPD, atrial fibrillation, HFpEF with EF of 40%, cirrhosis, HCV infection was in his USO H till 1 to 2 weeks ago when he started noticing fatigue and dyspnea on exertion.  During that same period of time patient had anorexia and significant decrease in his p.o. intake.  He subsequently developed nausea with dry heaves but no vomiting.  He had watery stools for several days.  Patient states these symptoms felt like his atrial fibrillation was out of control as this was how he felt when he initially presented with A. fib with RVR.  Patient was not at his home so he did not check his heart rate.  Patient was afraid to drive home because he felt so weak and dizzy so thought he would wait it out.  Today however he was so weak and dizzy he asked his daughters to come pick him up.  Daughters brought him to urgent care who sent him to the ED.  To my history, patient states he believes this is all atrial fibrillation because he does not believe in COVID.  He notes "other people are afraid of it I am not afraid of it".  Notes he had COVID last year with minimal to no symptoms.  ED Course:  The patient was noted to be afebrile, hypotensive with heart rate of 78/56 and tachycardic with RVR, EKG with heart rate of 122.  Patient was treated with IV fluid resuscitation with improvement of blood pressure and heart rate.  Patient has acute kidney injury and is noted to be COVID-positive although he has no oxygen requirement.  Patient is admitted for management of acute kidney injury and persistent hypotension.  ROS:   ROS   Review of Systems: General: Denies fever, chills, Eyes:  Denies recent change in vision, no discharge, redness, pain noted Endocrine: Denies heat/cold intolerance, polyuria or weight loss. Respiratory: Denies cough, hemoptysis GI: Denies constipation GU: Denies dysuria, frequency or hematuria   Past Medical History:   Past Medical History:  Diagnosis Date   Atrial fibrillation with rapid ventricular response (HCC) 08/12/2020   Bicuspid aortic valve    Chronic hepatitis C virus infection (HCC) 08/03/2013   COPD (chronic obstructive pulmonary disease) (HCC)    Diabetes mellitus type 2, insulin dependent (HCC)    Essential hypertension 07/03/2015   Last Assessment & Plan:  Blood pressure is borderline high.  Advised to get better sleep, walk on a regular basis, decrease weight down to about 210 and maintain, and eliminate tobacco.   HFrEF (heart failure with reduced ejection fraction) (HCC) 08/22/2020   Long-term insulin use (HCC) 10/12/2016   Obesity, unspecified 10/12/2016   Patient advised to lose down to about 210 pounds and maintained.  Regular walking program recommended.  He knows how to lose weight.   Sleep apnea     Past Surgical History:   Past Surgical History:  Procedure Laterality Date   CARDIOVERSION N/A 09/24/2020   Procedure: CARDIOVERSION;  Surgeon: [name], MD;  Location: [facility] ENDOSCOPY;  Service: Cardiovascular;  Laterality: N/A;   CCTA  08/2019   TEE WITHOUT CARDIOVERSION  09/24/2020   Procedure: TRANSESOPHAGEAL ECHOCARDIOGRAM (TEE);  Surgeon: Bjorn Pippin,  Tanna Savoy, MD;  Location: [facility] ENDOSCOPY;  Service: Cardiovascular;;    Social History:   Social History   Socioeconomic History   Marital status: Widowed    Spouse name: Not on file   Number of children: Not on file   Years of education: Not on file   Highest education level: Not on file  Occupational History   Not on file  Tobacco Use   Smoking status: Former    Pack years: 0.00   Smokeless tobacco: Current    Types: Snuff  Vaping Use    Vaping Use: Never used  Substance and Sexual Activity   Alcohol use: Not Currently   Drug use: Never   Sexual activity: Not on file  Other Topics Concern   Not on file  Social History Narrative   Not on file   Social Determinants of Health   Financial Resource Strain: Not on file  Food Insecurity: Not on file  Transportation Needs: Not on file  Physical Activity: Not on file  Stress: Not on file  Social Connections: Not on file  Intimate Partner Violence: Not on file    Allergies   Patient has no known allergies.  Family history:   Family History  Problem Relation Age of Onset   Cancer Maternal Grandfather    Liver disease Sister    Alzheimer's disease Other    Memory loss Other    Hypertension Other     Current Medications:   Prior to Admission medications   Medication Sig Start Date End Date Taking? Authorizing Provider  albuterol (PROVENTIL) (2.5 MG/3ML) 0.083% nebulizer solution Take 3 mLs (2.5 mg total) by nebulization every 6 (six) hours as needed for wheezing or shortness of breath. 08/22/20   Abigail Miyamoto, MD  ALPRAZolam Prudy Feeler) 0.5 MG tablet Take 1 tablet (0.5 mg total) by mouth 3 (three) times daily as needed for anxiety. 04/09/21   Abigail Miyamoto, MD  amLODipine (NORVASC) 5 MG tablet Take 1 tablet (5 mg total) by mouth every other day. 01/23/21   Baldo Daub, MD  atorvastatin (LIPITOR) 40 MG tablet TAKE 1 TABLET BY MOUTH EVERY DAY 03/26/21   Abigail Miyamoto, MD  BD PEN NEEDLE NANO 2ND GEN 32G X 4 MM MISC USE AS DIRECTED ONCE DAILY 12/24/20   Abigail Miyamoto, MD  ELIQUIS 5 MG TABS tablet TAKE 1 TABLET(5 MG) BY MOUTH TWICE DAILY 12/06/20   Baldo Daub, MD  glucose blood (CONTOUR NEXT TEST) test strip 1 each by Other route as needed for other. Use as instructed 11/26/20   Abigail Miyamoto, MD  metFORMIN (GLUCOPHAGE) 500 MG tablet Take 1 tablet (500 mg total) by mouth 2 (two) times daily with a meal. 12/06/20   Abigail Miyamoto, MD  metoprolol succinate (TOPROL-XL) 100 MG 24 hr tablet Take 1 tablet (100 mg total) by mouth daily. 10/02/20   Baldo Daub, MD  Microlet Lancets MISC USE TO CHECK BLOOD SUGAR EVERY MORNING AND AFTER MEALS 11/29/19   [provider]  sacubitril-valsartan (ENTRESTO) 49-51 MG Take 1 tablet by mouth 2 (two) times daily. 01/23/21   Baldo Daub, MD  torsemide (DEMADEX) 20 MG tablet Take 1 tablet (20 mg total) by mouth 2 (two) times daily. 01/23/21 04/23/21  Baldo Daub, MD  traMADol (ULTRAM) 50 MG tablet Take 100 mg by mouth every 6 (six) hours as needed for severe pain.  12/26/19   [provider]  TRESIBA FLEXTOUCH 100 UNIT/ML  FlexTouch Pen ADMINISTER 62 UNITS UNDER THE SKIN DAILY 04/06/21   Abigail MiyamotoPerry, Lawrence Edward, MD  umeclidinium-vilanterol [facility](ANORO ELLIPTA) 62.5-25 MCG/INH AEPB Inhale 1 puff into the lungs daily.    [provider]  VICTOZA 18 MG/3ML SOPN ADMINISTER 1.2 MG UNDER THE SKIN DAILY 10/29/20   Abigail MiyamotoPerry, Lawrence Edward, MD  zolpidem (AMBIEN) 10 MG tablet Take 1 tablet (10 mg total) by mouth at bedtime as needed for sleep. 04/09/21   Abigail MiyamotoPerry, Lawrence Edward, MD    Physical Exam:   Vitals:   06/01/21 0521 06/01/21 0530 06/01/21 0700 06/01/21 0730  BP: 90/74 105/62 (!) 82/53 92/63  Pulse:  (!) 103 93 (!) 104  Resp: 20 17 18 14   Temp:      TempSrc:      SpO2:  97% 92% 96%  Weight:      Height:         Physical Exam: Blood pressure 92/63, pulse (!) 104, temperature 98.5 F (36.9 C), temperature source Oral, resp. rate 14, height 5\' 5"  (1.651 m), weight 98.9 kg, SpO2 96 %. Gen: Patient standing up brushing his teeth in no acute distress.  Heart rate did however go up from 108-1 33 while he was moving around. Eyes: sclera anicteric, conjuctiva mildly injected bilaterally CVS: S1-S2, regulary, no gallops Respiratory:  decreased air entry likely secondary to decreased inspiratory effort GI: NABS, soft, NT  LE: No edema. No cyanosis Neuro: A/O x  3, Moving all extremities equally with normal strength, CN 3-12 intact, grossly nonfocal.  Psych: judgement and insight poor, mood and affect seem to be in denial   Data Review:    Labs: Basic Metabolic Panel: Recent Labs  Lab 06/01/21 0124  NA 134*  K 3.2*  CL 94*  CO2 24  GLUCOSE 91  BUN 66*  CREATININE 3.00*  CALCIUM 8.2*   Liver Function Tests: Recent Labs  Lab 06/01/21 0124  AST 32  ALT 25  ALKPHOS 40  BILITOT 1.4*  PROT 7.1  ALBUMIN 3.9   Recent Labs  Lab 06/01/21 0124  LIPASE 172*   No results for input(s): AMMONIA in the last 168 hours. CBC: Recent Labs  Lab 06/01/21 0124  WBC 5.4  NEUTROABS 3.4  HGB 13.8  HCT 39.7  MCV 91.3  PLT 202   Cardiac Enzymes: No results for input(s): CKTOTAL, CKMB, CKMBINDEX, TROPONINI in the last 168 hours.  BNP (last 3 results) Recent Labs    09/16/20 1157 01/23/21 1334 02/20/21 1535  PROBNP 762* 792* 407*   CBG: No results for input(s): GLUCAP in the last 168 hours.  Urinalysis No results found for: COLORURINE, APPEARANCEUR, LABSPEC, PHURINE, GLUCOSEU, HGBUR, BILIRUBINUR, KETONESUR, PROTEINUR, UROBILINOGEN, NITRITE, LEUKOCYTESUR    Radiographic Studies: CT ABDOMEN PELVIS WO CONTRAST  Result Date: 06/01/2021 CLINICAL DATA:  55 year old male with abdominal pain. EXAM: CT ABDOMEN AND PELVIS WITHOUT CONTRAST TECHNIQUE: Multidetector CT imaging of the abdomen and pelvis was performed following the standard protocol without IV contrast. [facility], CTA chest 11/01/2013, lumbar MRI 09/10/2014. Abdomen MRI 07/20/2007. FINDINGS: Lower chest: Negative. Hepatobiliary: Nodular liver contour (series 3, image 29 and 21) suspicious for cirrhosis, new since the 2008 MRI. No discrete lesion in the noncontrast liver. Negative gallbladder. No bile duct enlargement. Just inferior and lateral to the right hepatic lobe of the liver is a small 2.1 cm circumscribed rim calcified soft tissue  nodule in the peritoneal cavity (coronal image 46). This level was not included on the prior CTs,  but was present on the 2008 MRI and unchanged from that time indicating benign etiology (perhaps an area of chronic fat necrosis). Pancreas: Negative noncontrast pancreas. Spleen: Negative.  No splenomegaly. Adrenals/Urinary Tract: Normal adrenal glands. Nonobstructed kidneys. Punctate left nephrolithiasis on coronal image 75. No suspicious renal lesion in the absence of IV contrast. Both ureters are decompressed to the bladder. The bladder is distended and lobulated prostate soft tissue extends into the base of the bladder. Estimated bladder volume 670 mL. No perivesical stranding. Stomach/Bowel: Negative large bowel, with normal retrocecal appendix on series 3, image 66. Negative terminal ileum. No dilated small bowel. Decompressed stomach and duodenum. No free air, free fluid, mesenteric inflammation. Vascular/Lymphatic: Aortoiliac calcified atherosclerosis. Normal caliber abdominal aorta. No lymphadenopathy. Reproductive: Negative. Other: No pelvic free fluid. Musculoskeletal: Chronic L4-L5 degeneration. No acute osseous abnormality identified. IMPRESSION: 1. Nodular liver contour indicative of Cirrhosis. No discrete liver lesion on this noncontrast exam. 2. Distended bladder (670 mL) without hydroureter or hydronephrosis. Query urinary retention and/or prostate related bladder outlet obstruction. Punctate left nephrolithiasis. 3. Normal appendix.  No bowel inflammation. 4. Aortic Atherosclerosis (ICD10-I70.0). Electronically Signed   By: Odessa Fleming M.D.   On: 06/01/2021 04:18   DG Chest Port 1 View  Result Date: 06/01/2021 CLINICAL DATA:  Worsening shortness of breath, dyspnea on exertion, and atrial fibrillation. EXAM: PORTABLE CHEST 1 VIEW COMPARISON:  08/12/2020 FINDINGS: The heart size and mediastinal contours are within normal limits. Both lungs are clear. The visualized skeletal structures are unremarkable.  IMPRESSION: No active disease. Electronically Signed   By: Burman Nieves M.D.   On: 06/01/2021 02:22    EKG: Independently reviewed.  Atrial fibrillation at 120.  RBBB.  Axis at 100.  Nonspecific ST-T wave changes.   Assessment/Plan:   Active Problems:   Acute kidney injury (HCC)  55 year old man with HFpEF, atrial fibrillation and cirrhosis is admitted with hypotension, A. fib with RVR, COVID infection and acute on chronic kidney injury.   Atrial fibrillation with RVR Heart rate and blood pressure much improved with IV fluid resuscitation Patient's had decreased p.o. intake along with diarrhea for the past 7 to 10 days. Patient has received 3 L of IV fluids via bolus Given COVID infection, will hold off on further IV fluid resuscitation and let patient eat and drink Can provide IV fluids if blood pressure decreases or heart rate worsens Once blood pressure is stable and normal, can reinitiate patient's beta-blocker. Continue Eliquis 5 mg p.o. twice daily per home doses  Hypotension/HTN As noted above, BP has responded to IV fluids 3 L bolused Holding antihypertensives including metoprolol, meds still to be reconciled Can restart blood pressure meds once patient is eating and drinking well and blood pressure has been stable and normal.  COVID-19 infection Chest x-ray without evidence of infection, patient without any oxygen requirement Will follow strict I's and O's to see if patient has persistent diarrhea, patient was clearly fluid down due to decreased p.o. intake and diarrhea from COVID Airborne precautions in place  Acute on chronic kidney injury with likely bladder outlet obstruction There should be some improvement with fluid resuscitation and resumption of normotension Patient does have enlarged bladder on CT, likely some level of bladder obstruction. Patient is refusing Foley Patient does not have any hydroureter so it is unclear whether or not this is contributing to  his renal failure. Recheck creatinine in the morning  Hypokalemia Will replete and recheck  HFpEF EF of 40% from echocardiogram done 2 months ago  Patient is presently fluid depleted, no evidence of decompensated heart failure Beta-blocker and Entresto can be restarted once blood pressure has normalized.  Cirrhosis per CT Liver with nodular contours consistent with cirrhosis Known Hep C infection     Other information:   DVT prophylaxis: Eliquis ordered. Code Status: Full Family Communication: Patient states his family knows he is here Disposition Plan: Home Consults called: None Admission status: Inpatient  Kaela Beitz Tublu Seara Hinesley [facility]  If 7PM-7AM, please contact night-coverage [email or web address]

## 2021-06-01 NOTE — ED Triage Notes (Signed)
Pt seen at urgent care today and recommended ED follow up. Pt c/o worsening SOB, dyspnea with exertion, and A fib. Denies CP. Hx of A fib on anticoagulate.   Per note BP in triage 85/64 IV obtained and 500 mL IVF started

## 2021-06-01 NOTE — ED Notes (Signed)
The doctor Garner Gavel notified that he signed out AMA. Patient was made aware of risks and benefits of leaving and signed AMA. He was informed to come back to the emergency department at any time.

## 2021-06-03 ENCOUNTER — Telehealth (HOSPITAL_COMMUNITY): Payer: Self-pay

## 2021-06-03 NOTE — Telephone Encounter (Signed)
Reached out to patient he states that he have COVID and that he will call his PCP in the morning for advice. Informed patient that we can do a video visit he decline.

## 2021-06-10 ENCOUNTER — Other Ambulatory Visit: Payer: Self-pay | Admitting: Legal Medicine

## 2021-06-10 DIAGNOSIS — E1142 Type 2 diabetes mellitus with diabetic polyneuropathy: Secondary | ICD-10-CM

## 2021-06-13 ENCOUNTER — Institutional Professional Consult (permissible substitution): Payer: 59 | Admitting: Cardiology

## 2021-06-19 ENCOUNTER — Encounter: Payer: Self-pay | Admitting: Legal Medicine

## 2021-06-19 ENCOUNTER — Other Ambulatory Visit: Payer: Self-pay

## 2021-06-19 ENCOUNTER — Ambulatory Visit (INDEPENDENT_AMBULATORY_CARE_PROVIDER_SITE_OTHER): Payer: 59 | Admitting: Legal Medicine

## 2021-06-19 VITALS — BP 80/60 | HR 64 | Temp 96.5°F | Resp 16 | Ht 65.0 in | Wt 218.0 lb

## 2021-06-19 DIAGNOSIS — I4891 Unspecified atrial fibrillation: Secondary | ICD-10-CM

## 2021-06-19 DIAGNOSIS — I5020 Unspecified systolic (congestive) heart failure: Secondary | ICD-10-CM

## 2021-06-19 DIAGNOSIS — N179 Acute kidney failure, unspecified: Secondary | ICD-10-CM

## 2021-06-19 LAB — POCT URINALYSIS DIP (CLINITEK)
Bilirubin, UA: NEGATIVE
Blood, UA: NEGATIVE
Glucose, UA: NEGATIVE mg/dL
Ketones, POC UA: NEGATIVE mg/dL
Leukocytes, UA: NEGATIVE
Nitrite, UA: NEGATIVE
POC PROTEIN,UA: 30 — AB
Spec Grav, UA: 1.015 (ref 1.010–1.025)
Urobilinogen, UA: 0.2 E.U./dL
pH, UA: 6 (ref 5.0–8.0)

## 2021-06-19 NOTE — Assessment & Plan Note (Signed)
EF 50-55% 05/03/2021

## 2021-06-19 NOTE — Progress Notes (Signed)
Established Patient Office Visit  Subjective:  Patient ID: Marcus Hicks, male    DOB: Apr 03, 1966  Age: 55 y.o. MRN: [id number]  CC:  Chief Complaint  Patient presents with   Acute Renal Failure    HPI Marcus Hicks presents for COVID with atrial fibrillation and uncontrolled response.  He was in ER on 06/01/2021.Na 124, K 3.2, Cl 94, BUN 66, Creatinine 3.00, eGFR 24.[place] beach and got [place].  He had family drive him home. Since hospital, he is feeling improved and drinking fluids, His Sp gr is 1.015  Baseline creatinine 0.99, BUN 16, eGFR 89  He is feeling better. Slow urination. Diabetes ? Not checking BS, still orthostasis.  Past Medical History:  Diagnosis Date   Atrial fibrillation with rapid ventricular response ([place]) 08/12/2020   Bicuspid aortic valve    Chronic hepatitis C virus infection ([place]) 08/03/2013   COPD (chronic obstructive pulmonary disease) (HCC)    Diabetes mellitus type 2, insulin dependent ([place])    Essential hypertension 07/03/2015   Last Assessment & Plan:  Blood pressure is borderline high.  Advised to get better sleep, walk on a regular basis, decrease weight down to about 210 and maintain, and eliminate tobacco.   HFrEF (heart failure with reduced ejection fraction) ([place]) 08/22/2020   Long-term insulin use ([place]) 10/12/2016   Obesity, unspecified 10/12/2016   Patient advised to lose down to about 210 pounds and maintained.  Regular walking program recommended.  He knows how to lose weight.   Sleep apnea     Past Surgical History:  Procedure Laterality Date   CARDIOVERSION N/A 09/24/2020   Procedure: CARDIOVERSION;  Surgeon: Donato [name], MD;  Location: [facility] ENDOSCOPY;  Service: Cardiovascular;  Laterality: N/A;   CCTA  08/2019   TEE WITHOUT CARDIOVERSION  09/24/2020   Procedure: TRANSESOPHAGEAL ECHOCARDIOGRAM (TEE);  Surgeon: Donato [name], MD;  Location: [facility] ENDOSCOPY;  Service: Cardiovascular;;    Family History   Problem Relation Age of Onset   Cancer Maternal Grandfather    Liver disease Sister    Alzheimer's disease Other    Memory loss Other    Hypertension Other     Social History   Socioeconomic History   Marital status: Widowed    Spouse name: Not on file   Number of children: Not on file   Years of education: Not on file   Highest education level: Not on file  Occupational History   Not on file  Tobacco Use   Smoking status: Former   Smokeless tobacco: Current    Types: Snuff  Vaping Use   Vaping Use: Never used  Substance and Sexual Activity   Alcohol use: Not Currently   Drug use: Never   Sexual activity: Not on file  Other Topics Concern   Not on file  Social History Narrative   Not on file   Social Determinants of Health   Financial Resource Strain: Not on file  Food Insecurity: Not on file  Transportation Needs: Not on file  Physical Activity: Not on file  Stress: Not on file  Social Connections: Not on file  Intimate Partner Violence: Not on file    Outpatient Medications Prior to Visit  Medication Sig Dispense Refill   albuterol (PROVENTIL) (2.5 MG/3ML) 0.083% nebulizer solution Take 3 mLs (2.5 mg total) by nebulization every 6 (six) hours as needed for wheezing or shortness of breath. 150 mL 3   ALPRAZolam (XANAX) 0.5 MG tablet Take 1 tablet (0.5  mg total) by mouth 3 (three) times daily as needed for anxiety. (Patient taking differently: Take 0.5 mg by mouth 2 (two) times daily as needed for anxiety.) 90 tablet 3   atorvastatin (LIPITOR) 40 MG tablet TAKE 1 TABLET BY MOUTH EVERY DAY (Patient taking differently: Take 40 mg by mouth every morning.) 90 tablet 2   BD PEN NEEDLE NANO 2ND GEN 32G X 4 MM MISC USE AS DIRECTED ONCE DAILY 100 each 4   ELIQUIS 5 MG TABS tablet TAKE 1 TABLET(5 MG) BY MOUTH TWICE DAILY (Patient taking differently: Take 5 mg by mouth 2 (two) times daily.) 60 tablet 5   glucose blood (CONTOUR NEXT TEST) test strip 1 each by Other route as  needed for other. Use as instructed 100 each 4   metFORMIN (GLUCOPHAGE) 500 MG tablet TAKE 1 TABLET(500 MG) BY MOUTH TWICE DAILY WITH A MEAL 180 tablet 2   metoprolol succinate (TOPROL-XL) 100 MG 24 hr tablet Take 1 tablet (100 mg total) by mouth daily. (Patient taking differently: Take 100 mg by mouth every morning.) 90 tablet 3   Microlet Lancets MISC USE TO CHECK BLOOD SUGAR EVERY MORNING AND AFTER MEALS     sacubitril-valsartan (ENTRESTO) 49-51 MG Take 1 tablet by mouth 2 (two) times daily. 180 tablet 3   TRESIBA FLEXTOUCH 100 UNIT/ML FlexTouch Pen ADMINISTER 62 UNITS UNDER THE SKIN DAILY (Patient taking differently: Inject 65 Units into the skin every morning.) 18 mL 6   umeclidinium-vilanterol (ANORO ELLIPTA) 62.5-25 MCG/INH AEPB Inhale 1 puff into the lungs every morning.     VICTOZA 18 MG/3ML SOPN ADMINISTER 1.2 MG UNDER THE SKIN DAILY (Patient taking differently: Inject 1.2 mg into the skin every morning.) 12 mL 6   zolpidem (AMBIEN) 10 MG tablet Take 1 tablet (10 mg total) by mouth at bedtime as needed for sleep. 30 tablet 3   amLODipine (NORVASC) 5 MG tablet Take 1 tablet (5 mg total) by mouth every other day. 45 tablet 3   torsemide (DEMADEX) 20 MG tablet Take 1 tablet (20 mg total) by mouth 2 (two) times daily. 180 tablet 3   No facility-administered medications prior to visit.    No Known Allergies  ROS Review of Systems  Constitutional:  Negative for chills, fatigue and fever.  HENT:  Negative for congestion, ear pain and sore throat.   Respiratory:  Negative for cough and shortness of breath.   Cardiovascular:  Negative for chest pain.  Gastrointestinal:  Negative for abdominal pain, constipation, diarrhea, nausea and vomiting.  Endocrine: Negative for polydipsia, polyphagia and polyuria.  Genitourinary:  Negative for dysuria and frequency.  Musculoskeletal:  Negative for arthralgias and myalgias.  Neurological:  Negative for dizziness and headaches.   Psychiatric/Behavioral:  Negative for dysphoric mood.        No dysphoria     Objective:    Physical Exam Vitals reviewed.  Constitutional:      Appearance: Normal appearance. He is obese.  HENT:     Right Ear: Tympanic membrane, ear canal and external ear normal.     Left Ear: Tympanic membrane, ear canal and external ear normal.  Cardiovascular:     Rate and Rhythm: Normal rate and regular rhythm.     Pulses: Normal pulses.     Heart sounds: Normal heart sounds. No murmur heard.   No gallop.  Pulmonary:     Effort: Pulmonary effort is normal.     Breath sounds: Normal breath sounds.  Abdominal:  General: Abdomen is flat. Bowel sounds are normal. There is no distension.     Palpations: Abdomen is soft.     Tenderness: There is no abdominal tenderness.  Neurological:     Mental Status: He is alert.    BP (!) 80/60   Pulse 64   Temp (!) 96.5 F (35.8 C)   Resp 16   Ht _0  (1.651 m)   Wt 218 lb (98.9 kg)   BMI 36.28 kg/m  Wt Readings from Last 3 Encounters:  06/19/21 218 lb (98.9 kg)  06/01/21 218 lb (98.9 kg)  04/18/21 218 lb 9.6 oz (99.2 kg)     Health Maintenance Due  Topic Date Due   OPHTHALMOLOGY EXAM  Never done   COLONOSCOPY (Pts 45-66yr Insurance coverage will need to be confirmed)  Never done   Zoster Vaccines- Shingrix (1 of 2) Never done   TETANUS/TDAP  06/11/2016    There are no preventive care reminders to display for this patient.  No results found for: TSH Lab Results  Component Value Date   WBC 5.4 06/01/2021   HGB 13.8 06/01/2021   HCT 39.7 06/01/2021   MCV 91.3 06/01/2021   PLT 202 06/01/2021   Lab Results  Component Value Date   NA 134 (L) 06/01/2021   K 3.2 (L) 06/01/2021   CO2 24 06/01/2021   GLUCOSE 91 06/01/2021   BUN 66 (H) 06/01/2021   CREATININE 3.00 (H) 06/01/2021   BILITOT 1.4 (H) 06/01/2021   ALKPHOS 40 06/01/2021   AST 32 06/01/2021   ALT 25 06/01/2021   PROT 7.1 06/01/2021   ALBUMIN 3.9 06/01/2021    CALCIUM 8.2 (L) 06/01/2021   ANIONGAP 16 (H) 06/01/2021   EGFR 89 04/09/2021   Lab Results  Component Value Date   CHOL 190 04/09/2021   Lab Results  Component Value Date   HDL 37 (L) 04/09/2021   Lab Results  Component Value Date   LDLCALC 115 (H) 04/09/2021   Lab Results  Component Value Date   TRIG 215 (H) 04/09/2021   Lab Results  Component Value Date   CHOLHDL 5.1 (H) 04/09/2021   Lab Results  Component Value Date   HGBA1C 7.4 (H) 04/09/2021      Assessment & Plan:   Diagnoses and all orders for this visit: Acute kidney injury ([place] -     Comprehensive metabolic panel -     POCT URINALYSIS DIP (CLINITEK) Patient has acute kidney injury in ER, he is feeling improved but still weak, no orthostasis.  HFrEF (heart failure with reduced ejection fraction) ([place] Patient has history of diastolic failure and has been on tosemide.  I will hold for one week and hold amlodipine.  Atrial fibrillation with rapid ventricular response (HCC)  Patient's RVR has resoled with hydration.    Follow-up: Return in about 1 week (around 06/26/2021) for renal failure.    LReinaldo Meeker MD

## 2021-06-19 NOTE — Patient Instructions (Signed)
Stop amlodipine and torsemide for present

## 2021-06-20 LAB — COMPREHENSIVE METABOLIC PANEL
ALT: 37 IU/L (ref 0–44)
AST: 33 IU/L (ref 0–40)
Albumin/Globulin Ratio: 1.8 (ref 1.2–2.2)
Albumin: 4.9 g/dL (ref 3.8–4.9)
Alkaline Phosphatase: 63 IU/L (ref 44–121)
BUN/Creatinine Ratio: 10 (ref 9–20)
BUN: 13 mg/dL (ref 6–24)
Bilirubin Total: 0.6 mg/dL (ref 0.0–1.2)
CO2: 30 mmol/L — ABNORMAL HIGH (ref 20–29)
Calcium: 9.7 mg/dL (ref 8.7–10.2)
Chloride: 97 mmol/L (ref 96–106)
Creatinine, Ser: 1.28 mg/dL — ABNORMAL HIGH (ref 0.76–1.27)
Globulin, Total: 2.8 g/dL (ref 1.5–4.5)
Glucose: 129 mg/dL — ABNORMAL HIGH (ref 65–99)
Potassium: 4.5 mmol/L (ref 3.5–5.2)
Sodium: 142 mmol/L (ref 134–144)
Total Protein: 7.7 g/dL (ref 6.0–8.5)
eGFR: 66 mL/min/{1.73_m2} (ref >59)

## 2021-06-20 NOTE — Progress Notes (Signed)
Glucose 129, creatinine 1.28 down from 3, BUN13 normal, kidneys ar recovering well lp

## 2021-06-26 ENCOUNTER — Ambulatory Visit: Payer: 59 | Admitting: Legal Medicine

## 2021-07-03 ENCOUNTER — Other Ambulatory Visit: Payer: Self-pay

## 2021-07-03 ENCOUNTER — Ambulatory Visit (INDEPENDENT_AMBULATORY_CARE_PROVIDER_SITE_OTHER): Payer: 59 | Admitting: Legal Medicine

## 2021-07-03 ENCOUNTER — Encounter: Payer: Self-pay | Admitting: Legal Medicine

## 2021-07-03 VITALS — BP 104/70 | HR 92 | Temp 97.3°F | Ht 65.0 in | Wt 225.0 lb

## 2021-07-03 DIAGNOSIS — I10 Essential (primary) hypertension: Secondary | ICD-10-CM

## 2021-07-03 NOTE — Progress Notes (Signed)
Established Patient Office Visit  Subjective:  Patient ID: Marcus Hicks, male    DOB: Oct 25, 1966  Age: 55 y.o. MRN: [id number]  CC:  Chief Complaint  Patient presents with   Follow-up    Acute kidney injury     HPI Marcus Hicks presents for follow up  Patient feeling much better after stopping amlodipine,  He is active and doing lawn work.  Patient presents for follow up of hypertension.  Patient tolerating valsartan well with side effects.  Patient was diagnosed with hypertension 2010 so has been treated for hypertension for 10 years.Patient is working on maintaining diet and exercise regimen and follows up as directed. Complication include none.   Past Medical History:  Diagnosis Date   Atrial fibrillation with rapid ventricular response ([place]) 08/12/2020   Bicuspid aortic valve    Chronic hepatitis C virus infection ([place]) 08/03/2013   COPD (chronic obstructive pulmonary disease) (HCC)    Diabetes mellitus type 2, insulin dependent ([place])    Essential hypertension 07/03/2015   Last Assessment & Plan:  Blood pressure is borderline high.  Advised to get better sleep, walk on a regular basis, decrease weight down to about 210 and maintain, and eliminate tobacco.   HFrEF (heart failure with reduced ejection fraction) ([place]) 08/22/2020   Long-term insulin use ([place]) 10/12/2016   Obesity, unspecified 10/12/2016   Patient advised to lose down to about 210 pounds and maintained.  Regular walking program recommended.  He knows how to lose weight.   Sleep apnea     Past Surgical History:  Procedure Laterality Date   CARDIOVERSION N/A 09/24/2020   Procedure: CARDIOVERSION;  Surgeon: Donato [name], MD;  Location: [facility] ENDOSCOPY;  Service: Cardiovascular;  Laterality: N/A;   CCTA  08/2019   TEE WITHOUT CARDIOVERSION  09/24/2020   Procedure: TRANSESOPHAGEAL ECHOCARDIOGRAM (TEE);  Surgeon: Donato [name], MD;  Location: [facility] ENDOSCOPY;  Service: Cardiovascular;;     Family History  Problem Relation Age of Onset   Cancer Maternal Grandfather    Liver disease Sister    Alzheimer's disease Other    Memory loss Other    Hypertension Other     Social History   Socioeconomic History   Marital status: Widowed    Spouse name: Not on file   Number of children: Not on file   Years of education: Not on file   Highest education level: Not on file  Occupational History   Not on file  Tobacco Use   Smoking status: Former   Smokeless tobacco: Former    Types: Snuff  Vaping Use   Vaping Use: Never used  Substance and Sexual Activity   Alcohol use: Not Currently   Drug use: Never   Sexual activity: Not on file  Other Topics Concern   Not on file  Social History Narrative   Not on file   Social Determinants of Health   Financial Resource Strain: Not on file  Food Insecurity: Not on file  Transportation Needs: Not on file  Physical Activity: Not on file  Stress: Not on file  Social Connections: Not on file  Intimate Partner Violence: Not on file    Outpatient Medications Prior to Visit  Medication Sig Dispense Refill   albuterol (PROVENTIL) (2.5 MG/3ML) 0.083% nebulizer solution Take 3 mLs (2.5 mg total) by nebulization every 6 (six) hours as needed for wheezing or shortness of breath. 150 mL 3   ALPRAZolam (XANAX) 0.5 MG tablet Take 1 tablet (0.5 mg total)  by mouth 3 (three) times daily as needed for anxiety. (Patient taking differently: Take 0.5 mg by mouth 2 (two) times daily as needed for anxiety.) 90 tablet 3   atorvastatin (LIPITOR) 40 MG tablet TAKE 1 TABLET BY MOUTH EVERY DAY (Patient taking differently: Take 40 mg by mouth every morning.) 90 tablet 2   BD PEN NEEDLE NANO 2ND GEN 32G X 4 MM MISC USE AS DIRECTED ONCE DAILY 100 each 4   ELIQUIS 5 MG TABS tablet TAKE 1 TABLET(5 MG) BY MOUTH TWICE DAILY (Patient taking differently: Take 5 mg by mouth 2 (two) times daily.) 60 tablet 5   glucose blood (CONTOUR NEXT TEST) test strip 1  each by Other route as needed for other. Use as instructed 100 each 4   metFORMIN (GLUCOPHAGE) 500 MG tablet TAKE 1 TABLET(500 MG) BY MOUTH TWICE DAILY WITH A MEAL 180 tablet 2   metoprolol succinate (TOPROL-XL) 100 MG 24 hr tablet Take 1 tablet (100 mg total) by mouth daily. (Patient taking differently: Take 100 mg by mouth every morning.) 90 tablet 3   Microlet Lancets MISC USE TO CHECK BLOOD SUGAR EVERY MORNING AND AFTER MEALS     sacubitril-valsartan (ENTRESTO) 49-51 MG Take 1 tablet by mouth 2 (two) times daily. 180 tablet 3   TRESIBA FLEXTOUCH 100 UNIT/ML FlexTouch Pen ADMINISTER 62 UNITS UNDER THE SKIN DAILY (Patient taking differently: Inject 65 Units into the skin every morning.) 18 mL 6   umeclidinium-vilanterol (ANORO ELLIPTA) 62.5-25 MCG/INH AEPB Inhale 1 puff into the lungs every morning.     VICTOZA 18 MG/3ML SOPN ADMINISTER 1.2 MG UNDER THE SKIN DAILY (Patient taking differently: Inject 1.2 mg into the skin every morning.) 12 mL 6   zolpidem (AMBIEN) 10 MG tablet Take 1 tablet (10 mg total) by mouth at bedtime as needed for sleep. 30 tablet 3   No facility-administered medications prior to visit.    No Known Allergies  ROS Review of Systems  Constitutional:  Negative for chills, fatigue and fever.  HENT:  Negative for congestion, ear pain and sore throat.   Respiratory:  Negative for cough and shortness of breath.   Cardiovascular:  Negative for chest pain.  Gastrointestinal:  Negative for abdominal pain, constipation, diarrhea, nausea and vomiting.  Endocrine: Negative for polydipsia, polyphagia and polyuria.  Genitourinary:  Negative for dysuria and frequency.  Musculoskeletal:  Negative for arthralgias and myalgias.  Neurological:  Negative for dizziness and headaches.  Psychiatric/Behavioral:  Negative for dysphoric mood.        No dysphoria     Objective:    Physical Exam Vitals reviewed.  Constitutional:      Appearance: Normal appearance.  HENT:     Head:  Normocephalic and atraumatic.     Right Ear: Tympanic membrane normal.     Left Ear: Tympanic membrane normal.  Eyes:     Extraocular Movements: Extraocular movements intact.     Conjunctiva/sclera: Conjunctivae normal.     Pupils: Pupils are equal, round, and reactive to light.  Cardiovascular:     Rate and Rhythm: Normal rate and regular rhythm.     Pulses: Normal pulses.     Heart sounds: Normal heart sounds. No murmur heard.   No gallop.  Pulmonary:     Effort: Pulmonary effort is normal. No respiratory distress.     Breath sounds: Normal breath sounds. No wheezing.  Abdominal:     General: Abdomen is flat. Bowel sounds are normal.     Palpations: Abdomen  is soft.  Musculoskeletal:        General: Normal range of motion.     Cervical back: Normal range of motion.  Skin:    General: Skin is warm.     Capillary Refill: Capillary refill takes less than 2 seconds.  Neurological:     General: No focal deficit present.     Mental Status: He is alert and oriented to person, place, and time. Mental status is at baseline.    BP 104/70   Pulse 92   Temp (!) 97.3 F (36.3 C)   Ht 5' 5" (1.651 m)   Wt 225 lb (102.1 kg)   SpO2 96%   BMI 37.44 kg/m  Wt Readings from Last 3 Encounters:  07/03/21 225 lb (102.1 kg)  06/19/21 218 lb (98.9 kg)  06/01/21 218 lb (98.9 kg)     Health Maintenance Due  Topic Date Due   OPHTHALMOLOGY EXAM  Never done   COLONOSCOPY (Pts 45-60yr Insurance coverage will need to be confirmed)  Never done   Zoster Vaccines- Shingrix (1 of 2) Never done   TETANUS/TDAP  06/11/2016   INFLUENZA VACCINE  06/30/2021    There are no preventive care reminders to display for this patient.  No results found for: TSH Lab Results  Component Value Date   WBC 5.4 06/01/2021   HGB 13.8 06/01/2021   HCT 39.7 06/01/2021   MCV 91.3 06/01/2021   PLT 202 06/01/2021   Lab Results  Component Value Date   NA 142 06/19/2021   K 4.5 06/19/2021   CO2 30 (H)  06/19/2021   GLUCOSE 129 (H) 06/19/2021   BUN 13 06/19/2021   CREATININE 1.28 (H) 06/19/2021   BILITOT 0.6 06/19/2021   ALKPHOS 63 06/19/2021   AST 33 06/19/2021   ALT 37 06/19/2021   PROT 7.7 06/19/2021   ALBUMIN 4.9 06/19/2021   CALCIUM 9.7 06/19/2021   ANIONGAP 16 (H) 06/01/2021   EGFR 66 06/19/2021   Lab Results  Component Value Date   CHOL 190 04/09/2021   Lab Results  Component Value Date   HDL 37 (L) 04/09/2021   Lab Results  Component Value Date   LDLCALC 115 (H) 04/09/2021   Lab Results  Component Value Date   TRIG 215 (H) 04/09/2021   Lab Results  Component Value Date   CHOLHDL 5.1 (H) 04/09/2021   Lab Results  Component Value Date   HGBA1C 7.4 (H) 04/09/2021      Assessment & Plan:   Diagnoses and all orders for this visit: Essential hypertension  An individual hypertension care plan was established and reinforced today.  The patient's status was assessed using clinical findings on exam and labs or diagnostic tests. The patient's success at meeting treatment goals on disease specific evidence-based guidelines and found to be well controlled.he feels stronger SELF MANAGEMENT: The patient and I together assessed ways to personally work towards obtaining the recommended goals. RECOMMENDATIONS: avoid decongestants found in common cold remedies, decrease consumption of alcohol, perform routine monitoring of BP with home BP cuff, exercise, reduction of dietary salt, take medicines as prescribed, try not to miss doses and quit smoking.  Regular exercise and maintaining a healthy weight is needed.  Stress reduction may help. A CLINICAL SUMMARY including written plan identify barriers to care unique to individual due to social or financial issues.  We attempt to mutually creat solutions for individual and family understanding.     Follow-up: Return in about 3 months (around 10/03/2021).  Reinaldo Meeker, MD

## 2021-07-15 ENCOUNTER — Ambulatory Visit (INDEPENDENT_AMBULATORY_CARE_PROVIDER_SITE_OTHER): Payer: 59 | Admitting: Cardiology

## 2021-07-15 ENCOUNTER — Telehealth: Payer: Self-pay | Admitting: Pharmacist

## 2021-07-15 ENCOUNTER — Other Ambulatory Visit: Payer: Self-pay

## 2021-07-15 ENCOUNTER — Encounter: Payer: Self-pay | Admitting: Cardiology

## 2021-07-15 VITALS — BP 128/94 | HR 122 | Ht 65.0 in | Wt 234.6 lb

## 2021-07-15 DIAGNOSIS — K7460 Unspecified cirrhosis of liver: Secondary | ICD-10-CM | POA: Diagnosis not present

## 2021-07-15 DIAGNOSIS — Q231 Congenital insufficiency of aortic valve: Secondary | ICD-10-CM

## 2021-07-15 DIAGNOSIS — I5020 Unspecified systolic (congestive) heart failure: Secondary | ICD-10-CM | POA: Diagnosis not present

## 2021-07-15 DIAGNOSIS — I4819 Other persistent atrial fibrillation: Secondary | ICD-10-CM | POA: Diagnosis not present

## 2021-07-15 NOTE — Progress Notes (Signed)
Electrophysiology Office Note:    Date:  07/15/2021   ID:  Marcus Hicks, DOB 21-Jun-1966, MRN [id number]  PCP:  Abigail Miyamoto, MD  [facility] Cardiologist:  None  [facility] Electrophysiologist:  Lanier Prude, MD   Referring MD: Baldo Daub, MD   Chief Complaint: AF  History of Present Illness:    Marcus Hicks is a 55 y.o. male who presents for an evaluation of AF at the request of Dr Dulce Sellar. Their medical history includes persistent atrial fibrillation, chronic systolic heart failure, bicuspid aortic valve, cirrhosis.  Mr Marcus Hicks's wife recently passed away from metastatic lung cancer. He is currently taking apixaban for stroke prophylaxis. He has not had a bleeding issue thus far taking the medication. He does have a history of cirrhosis and an associated bleeding risk.  Past Medical History:  Diagnosis Date   Atrial fibrillation with rapid ventricular response (HCC) 08/12/2020   Bicuspid aortic valve    Chronic hepatitis C virus infection (HCC) 08/03/2013   COPD (chronic obstructive pulmonary disease) (HCC)    Diabetes mellitus type 2, insulin dependent (HCC)    Essential hypertension 07/03/2015   Last Assessment & Plan:  Blood pressure is borderline high.  Advised to get better sleep, walk on a regular basis, decrease weight down to about 210 and maintain, and eliminate tobacco.   HFrEF (heart failure with reduced ejection fraction) (HCC) 08/22/2020   Long-term insulin use (HCC) 10/12/2016   Obesity, unspecified 10/12/2016   Patient advised to lose down to about 210 pounds and maintained.  Regular walking program recommended.  He knows how to lose weight.   Sleep apnea     Past Surgical History:  Procedure Laterality Date   CARDIOVERSION N/A 09/24/2020   Procedure: CARDIOVERSION;  Surgeon: [name], MD;  Location: [facility] ENDOSCOPY;  Service: Cardiovascular;  Laterality: N/A;   CCTA  08/2019   TEE WITHOUT CARDIOVERSION  09/24/2020    Procedure: TRANSESOPHAGEAL ECHOCARDIOGRAM (TEE);  Surgeon: [name], MD;  Location: [facility] ENDOSCOPY;  Service: Cardiovascular;;    Current Medications: Current Meds  Medication Sig   albuterol (PROVENTIL) (2.5 MG/3ML) 0.083% nebulizer solution Take 3 mLs (2.5 mg total) by nebulization every 6 (six) hours as needed for wheezing or shortness of breath.   ALPRAZolam (XANAX) 0.5 MG tablet Take 1 tablet (0.5 mg total) by mouth 3 (three) times daily as needed for anxiety. (Patient taking differently: Take 0.5 mg by mouth 2 (two) times daily as needed for anxiety.)   atorvastatin (LIPITOR) 40 MG tablet TAKE 1 TABLET BY MOUTH EVERY DAY (Patient taking differently: Take 40 mg by mouth every morning.)   BD PEN NEEDLE NANO 2ND GEN 32G X 4 MM MISC USE AS DIRECTED ONCE DAILY   ELIQUIS 5 MG TABS tablet TAKE 1 TABLET(5 MG) BY MOUTH TWICE DAILY (Patient taking differently: Take 5 mg by mouth 2 (two) times daily.)   glucose blood (CONTOUR NEXT TEST) test strip 1 each by Other route as needed for other. Use as instructed   metFORMIN (GLUCOPHAGE) 500 MG tablet TAKE 1 TABLET(500 MG) BY MOUTH TWICE DAILY WITH A MEAL   metoprolol succinate (TOPROL-XL) 100 MG 24 hr tablet Take 1 tablet (100 mg total) by mouth daily. (Patient taking differently: Take 100 mg by mouth every morning.)   Microlet Lancets MISC USE TO CHECK BLOOD SUGAR EVERY MORNING AND AFTER MEALS   sacubitril-valsartan (ENTRESTO) 49-51 MG Take 1 tablet by mouth 2 (two) times daily.   TRESIBA  FLEXTOUCH 100 UNIT/ML FlexTouch Pen ADMINISTER 62 UNITS UNDER THE SKIN DAILY (Patient taking differently: Inject 65 Units into the skin every morning.)   umeclidinium-vilanterol (ANORO ELLIPTA) 62.5-25 MCG/INH AEPB Inhale 1 puff into the lungs every morning.   VICTOZA 18 MG/3ML SOPN ADMINISTER 1.2 MG UNDER THE SKIN DAILY (Patient taking differently: Inject 1.2 mg into the skin every morning.)   zolpidem (AMBIEN) 10 MG tablet Take 1 tablet (10 mg total) by  mouth at bedtime as needed for sleep.     Allergies:   Patient has no known allergies.   Social History   Socioeconomic History   Marital status: Widowed    Spouse name: Not on file   Number of children: Not on file   Years of education: Not on file   Highest education level: Not on file  Occupational History   Not on file  Tobacco Use   Smoking status: Former   Smokeless tobacco: Former    Types: Snuff  Vaping Use   Vaping Use: Never used  Substance and Sexual Activity   Alcohol use: Not Currently   Drug use: Never   Sexual activity: Not on file  Other Topics Concern   Not on file  Social History Narrative   Not on file   Social Determinants of Health   Financial Resource Strain: Not on file  Food Insecurity: Not on file  Transportation Needs: Not on file  Physical Activity: Not on file  Stress: Not on file  Social Connections: Not on file     Family History: The patient's family history includes Alzheimer's disease in an other family member; Cancer in his maternal grandfather; Hypertension in an other family member; Liver disease in his sister; Memory loss in an other family member.  ROS:   Please see the history of present illness.    All other systems reviewed and are negative.  EKGs/Labs/Other Studies Reviewed:    The following studies were reviewed today:  May 13, 2021 echo personally reviewed Left ventricular function mildly decreased, 45 to 50% Right ventricular function normal Mildly dilated left and right atrium Trivial MR Moderate aortic stenosis.  Trivial AI.  Mean gradient across the aortic valve is 29.8 mmHg.  Peak gradient 45.9 mmHg. Aortic valve is highly calcified   May 16, 2021 CT cardiac calcium scoring IMPRESSION: 1. Coronary calcium score of 53. This was 69th percentile for age, gender, and race matched controls.  2. Aortic atherosclerosis noted.  3. Aortic valve calcium score of 1141, which is less suggestive of severe aortic  stenosis, but recommend correlation with recent echocardiogram for complete assessment.   EKG:  The ekg ordered today demonstrates atrial fibrillation with rapid ventricular rates (122bpm).   Recent Labs: 02/20/2021: NT-Pro BNP 407 06/01/2021: B Natriuretic Peptide 85.2; Hemoglobin 13.8; Platelets 202 06/19/2021: ALT 37; BUN 13; Creatinine, Ser 1.28; Potassium 4.5; Sodium 142  Recent Lipid Panel    Component Value Date/Time   CHOL 190 04/09/2021 1433   TRIG 215 (H) 04/09/2021 1433   HDL 37 (L) 04/09/2021 1433   CHOLHDL 5.1 (H) 04/09/2021 1433   LDLCALC 115 (H) 04/09/2021 1433    Physical Exam:    VS:  BP (!) 128/94   Pulse (!) 122   Ht 5\' 5"  (1.651 m)   Wt 234 lb 9.6 oz (106.4 kg)   SpO2 97%   BMI 39.04 kg/m     Wt Readings from Last 3 Encounters:  07/15/21 234 lb 9.6 oz (106.4 kg)  07/03/21 225  lb (102.1 kg)  06/19/21 218 lb (98.9 kg)     GEN: Well nourished, well developed in no acute distress. Obese. HEENT: Normal NECK: No JVD; No carotid bruits LYMPHATICS: No lymphadenopathy CARDIAC: irregularly irregular, no murmurs, rubs, gallops RESPIRATORY:  Clear to auscultation without rales, wheezing or rhonchi  ABDOMEN: Soft, non-tender, non-distended MUSCULOSKELETAL:  No edema; No deformity  SKIN: Warm and dry NEUROLOGIC:  Alert and oriented x 3 PSYCHIATRIC:  Normal affect   ASSESSMENT:    1. Persistent atrial fibrillation (HCC)   2. Bicuspid aortic valve   3. Cirrhosis of liver without ascites, unspecified hepatic cirrhosis type (HCC)   4. HFrEF (heart failure with reduced ejection fraction) (HCC)    PLAN:    In order of problems listed above:  1. Persistent atrial fibrillation (HCC) Symptomatic. He has early fatigue and dyspnea with minimal exertion. I think he would benefit from a rhythm control strategy. Given his liver disease, I think dofetilide is his best option. QTc is today and his renal function is stable with a Cr 1.28. I discussed the process of  beginning dofetilide including the risks and he wishes to proceed.  We discussed the watchman procedure as well during today's clinic visit. Given his cirrhosis, I do think he is an acceptable candidate for the procedure in an effort to avoid long term exposure to anticoagulation. Today, we decided to tackle one aspect of the AF care at a time and begin with loading dofetilide. After he is back in normal rhythm, will proceed with watchman evaluation and implant.     2. Bicuspid aortic valve Appears to be moderately stenosed. I did discuss the case with his primary cardiologist, Dr Dulce Sellar who thinks we are not at a point where replacement of the valve is indicated. Dr Dulce Sellar will continue to monitor this.  3. Cirrhosis of liver without ascites, unspecified hepatic cirrhosis type (HCC) Increases his risk of bleeding on long term anticoagulation. Watchman evaluation as above.  4. HFrEF (heart failure with reduced ejection fraction) (HCC) NYHA III today. Warm and dry on exam. EF mildly reduced on 04/2021 echo (45%). 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 For now, continue taking metoprolol, entresto.   Schedule dofetilide load. Follow up with me after dofetilide load to proceed with watchman scheduling.  Total time spent with patient today 65 minutes. This includes reviewing records, evaluating the patient and coordinating care.  Medication Adjustments/Labs and Tests Ordered: Current medicines are reviewed at length with the patient today.  Concerns regarding medicines are outlined above.  Orders Placed This Encounter  Procedures   EKG 12-Lead   No orders of the defined types were placed in this encounter.    Signed, Rossie Muskrat. Lalla Brothers, MD, [facility], [facility] 07/15/2021 9:58 PM    Electrophysiology [facility]

## 2021-07-15 NOTE — Patient Instructions (Signed)
Medication Instructions:  Your physician recommends that you continue on your current medications as directed. Please refer to the Current Medication list given to you today.  Labwork: None ordered.  Testing/Procedures: None ordered.  Follow-Up: Your physician wants you to follow-up in: The [facility] will contact you to schedule.    Any Other Special Instructions Will Be Listed Below (If Applicable).  If you need a refill on your cardiac medications before your next appointment, please call your pharmacy.   Tikosyn (Dofetilide) Hospital Admission  Prior to day of admission: Check with drug insurance company for cost of drug to ensure affordability --- Dofetilide 500 mcg twice a day.  GoodRx is an option if insurance copay is unaffordable.  All patients are tested for COVID-19 prior to admission.  No Benadryl is allowed 3 days prior to admission.  Please ensure no missed doses of your anticoagulation (blood thinner) for 3 weeks prior to admission. If a dose is missed please notify our office immediately.  A pharmacist will review all your medications for potential interactions with Tikosyn. If any medication changes are needed prior to admission we will be in touch with you.  If any new medications are started AFTER your admission date is set with Stacy RN. Please notify our office immediately so your medication list can be updated and reviewed by our pharmacist again. On day of admission: Tikosyn initiation requires a 3 night/4 day hospital stay with constant telemetry monitoring. You will have an EKG after each dose of Tikosyn as well as daily lab draws.  If the drug does not convert you to normal rhythm a cardioversion after the 4th dose of Tikosyn.  [facility] office visit on the morning of admission is needed for preliminary labs/ekg.  Time of admission is dependent on bed availability in the hospital. In some instances, you will be sent home until bed is available.  Rarely admission can be delayed to the following day if hospital census prevents available beds.  You may bring personal belongings/clothing with you to the hospital. Please leave your suitcase in the car until you arrive in admissions.  Questions please call our office at [phone]      

## 2021-07-15 NOTE — Telephone Encounter (Signed)
Medication list reviewed in anticipation of upcoming Tikosyn initiation. Patient is taking metformin which can increase concentrations of Tikosyn; albuterol and Anoro Ellipta inhalers are both QTc prolonging as well. None of these interactions are contraindicated so pt can continue on current therapy, but QTc will need to be monitored closely.  Patient is anticoagulated on Eliquis on the appropriate dose. Please ensure that patient has not missed any anticoagulation doses in the 3 weeks prior to Tikosyn initiation.   Patient will need to be counseled to avoid use of Benadryl while on Tikosyn and in the 2-3 days prior to Tikosyn initiation.

## 2021-07-21 ENCOUNTER — Telehealth (HOSPITAL_COMMUNITY): Payer: Self-pay | Admitting: *Deleted

## 2021-07-21 ENCOUNTER — Other Ambulatory Visit: Payer: Self-pay | Admitting: Cardiology

## 2021-07-21 MED ORDER — ELIQUIS 5 MG PO TABS: ORAL_TABLET | ORAL | 1 refills | Status: DC

## 2021-07-21 NOTE — Telephone Encounter (Signed)
Pt with positive covid test 06/01/21 - will not need covid testing prior to tikosyn admission on 08/11/21 since within 90 day window.

## 2021-07-21 NOTE — Telephone Encounter (Signed)
*  STAT* If patient is at the pharmacy, call can be transferred to refill team.   1. Which medications need to be refilled? (please list name of each medication and dose if known) Eliquis   2. Which pharmacy/location (including street and city if local pharmacy) is medication to be sent to? [facility]   3. Do they need a 30 day or 90 day supply? Not sure  Patient is out

## 2021-07-21 NOTE — Telephone Encounter (Signed)
Prescription refill request for Eliquis received. Indication:AFIB Last office visit:LAMBERT 07/15/21 Scr:1.28 06/19/21 Age: 60M Weight:106.4KG

## 2021-07-25 ENCOUNTER — Telehealth (HOSPITAL_COMMUNITY): Payer: Self-pay

## 2021-07-25 NOTE — Telephone Encounter (Signed)
Contacted [employer] and their fax number is no longer active. Paperwork resubmitted for prior authorization -Tikosyn admission date of service 08/11/2021. New fax # [phone]

## 2021-07-28 ENCOUNTER — Telehealth (HOSPITAL_COMMUNITY): Payer: Self-pay | Admitting: *Deleted

## 2021-07-28 NOTE — Telephone Encounter (Signed)
Authorization for inpatient stay starting 08/11/21 by [employer]. Auth number [id number]

## 2021-08-11 ENCOUNTER — Inpatient Hospital Stay (HOSPITAL_COMMUNITY)
Admission: RE | Admit: 2021-08-11 | Discharge: 2021-08-14 | DRG: 309 | Disposition: A | Discharge status: Home / Self Care | Payer: 59 | Attending: Cardiology | Admitting: Cardiology

## 2021-08-11 ENCOUNTER — Ambulatory Visit: Payer: 59 | Admitting: Legal Medicine

## 2021-08-11 ENCOUNTER — Ambulatory Visit (HOSPITAL_COMMUNITY)
Admission: RE | Admit: 2021-08-11 | Discharge: 2021-08-11 | Disposition: A | Discharge status: Home / Self Care | Payer: 59 | Source: Ambulatory Visit | Attending: Physician Assistant | Admitting: Physician Assistant

## 2021-08-11 ENCOUNTER — Other Ambulatory Visit: Payer: Self-pay

## 2021-08-11 ENCOUNTER — Encounter (HOSPITAL_COMMUNITY): Payer: Self-pay | Admitting: Physician Assistant

## 2021-08-11 VITALS — BP 132/98 | HR 93 | Ht 65.0 in | Wt 230.2 lb

## 2021-08-11 DIAGNOSIS — I110 Hypertensive heart disease with heart failure: Secondary | ICD-10-CM | POA: Diagnosis not present

## 2021-08-11 DIAGNOSIS — Z20822 Contact with and (suspected) exposure to covid-19: Secondary | ICD-10-CM | POA: Diagnosis present

## 2021-08-11 DIAGNOSIS — Z28310 Unvaccinated for covid-19: Secondary | ICD-10-CM

## 2021-08-11 DIAGNOSIS — D6869 Other thrombophilia: Secondary | ICD-10-CM | POA: Insufficient documentation

## 2021-08-11 DIAGNOSIS — Z87891 Personal history of nicotine dependence: Secondary | ICD-10-CM | POA: Diagnosis not present

## 2021-08-11 DIAGNOSIS — Z7984 Long term (current) use of oral hypoglycemic drugs: Secondary | ICD-10-CM

## 2021-08-11 DIAGNOSIS — I4819 Other persistent atrial fibrillation: Principal | ICD-10-CM | POA: Diagnosis present

## 2021-08-11 DIAGNOSIS — K7460 Unspecified cirrhosis of liver: Secondary | ICD-10-CM | POA: Diagnosis present

## 2021-08-11 DIAGNOSIS — Z7901 Long term (current) use of anticoagulants: Secondary | ICD-10-CM | POA: Diagnosis not present

## 2021-08-11 DIAGNOSIS — B182 Chronic viral hepatitis C: Secondary | ICD-10-CM | POA: Diagnosis present

## 2021-08-11 DIAGNOSIS — J449 Chronic obstructive pulmonary disease, unspecified: Secondary | ICD-10-CM | POA: Diagnosis not present

## 2021-08-11 DIAGNOSIS — G4730 Sleep apnea, unspecified: Secondary | ICD-10-CM | POA: Diagnosis present

## 2021-08-11 DIAGNOSIS — Z6838 Body mass index (BMI) 38.0-38.9, adult: Secondary | ICD-10-CM

## 2021-08-11 DIAGNOSIS — Z8249 Family history of ischemic heart disease and other diseases of the circulatory system: Secondary | ICD-10-CM

## 2021-08-11 DIAGNOSIS — I5022 Chronic systolic (congestive) heart failure: Secondary | ICD-10-CM | POA: Diagnosis present

## 2021-08-11 DIAGNOSIS — E669 Obesity, unspecified: Secondary | ICD-10-CM | POA: Diagnosis not present

## 2021-08-11 DIAGNOSIS — Q231 Congenital insufficiency of aortic valve: Secondary | ICD-10-CM

## 2021-08-11 DIAGNOSIS — Z79899 Other long term (current) drug therapy: Secondary | ICD-10-CM

## 2021-08-11 DIAGNOSIS — E119 Type 2 diabetes mellitus without complications: Secondary | ICD-10-CM | POA: Diagnosis not present

## 2021-08-11 DIAGNOSIS — I712 Thoracic aortic aneurysm, without rupture: Secondary | ICD-10-CM | POA: Diagnosis not present

## 2021-08-11 HISTORY — DX: Other thrombophilia: D68.69

## 2021-08-11 LAB — BASIC METABOLIC PANEL
Anion gap: 10 (ref 5–15)
BUN: 13 mg/dL (ref 6–20)
CO2: 30 mmol/L (ref 22–32)
Calcium: 9.7 mg/dL (ref 8.9–10.3)
Chloride: 99 mmol/L (ref 98–111)
Creatinine, Ser: 1.02 mg/dL (ref 0.61–1.24)
GFR, Estimated: >60 mL/min (ref >60)
Glucose, Bld: 166 mg/dL — ABNORMAL HIGH (ref 70–99)
Potassium: 4.5 mmol/L (ref 3.5–5.1)
Sodium: 139 mmol/L (ref 135–145)

## 2021-08-11 LAB — MAGNESIUM: Magnesium: 1.7 mg/dL (ref 1.7–2.4)

## 2021-08-11 LAB — GLUCOSE, CAPILLARY
Glucose-Capillary: 184 mg/dL — ABNORMAL HIGH (ref 70–99)
Glucose-Capillary: 114 mg/dL — ABNORMAL HIGH (ref 70–99)

## 2021-08-11 MED ORDER — INSULIN DEGLUDEC 100 UNIT/ML ~~LOC~~ SOPN: 65.0000 [IU] | PEN_INJECTOR | Freq: Every morning | SUBCUTANEOUS | Status: DC

## 2021-08-11 MED ORDER — SODIUM CHLORIDE 0.9% FLUSH: 3.0000 mL | INTRAVENOUS | Status: DC | PRN

## 2021-08-11 MED ORDER — SACUBITRIL-VALSARTAN 49-51 MG PO TABS
1.0000 | ORAL_TABLET | Freq: Two times a day (BID) | ORAL | Status: DC
  Administered 2021-08-11 – 2021-08-14 (×6): 1 via ORAL
  Filled 2021-08-11 (×7): qty 1

## 2021-08-11 MED ORDER — ATORVASTATIN CALCIUM 40 MG PO TABS
40.0000 mg | ORAL_TABLET | Freq: Every day | ORAL | Status: DC
  Administered 2021-08-12 – 2021-08-13 (×2): 40 mg via ORAL
  Filled 2021-08-11 (×2): qty 1

## 2021-08-11 MED ORDER — DOFETILIDE 500 MCG PO CAPS
500.0000 ug | ORAL_CAPSULE | Freq: Two times a day (BID) | ORAL | Status: DC
  Administered 2021-08-11 – 2021-08-13 (×5): 500 ug via ORAL
  Filled 2021-08-11 (×5): qty 1

## 2021-08-11 MED ORDER — ZOLPIDEM TARTRATE 5 MG PO TABS: 10.0000 mg | ORAL_TABLET | Freq: Every evening | ORAL | Status: DC | PRN

## 2021-08-11 MED ORDER — ALPRAZOLAM 0.5 MG PO TABS: 0.5000 mg | ORAL_TABLET | Freq: Three times a day (TID) | ORAL | Status: DC | PRN

## 2021-08-11 MED ORDER — UMECLIDINIUM-VILANTEROL 62.5-25 MCG/INH IN AEPB
1.0000 | INHALATION_SPRAY | Freq: Every morning | RESPIRATORY_TRACT | Status: DC
  Administered 2021-08-12 – 2021-08-14 (×3): 1 via RESPIRATORY_TRACT
  Filled 2021-08-11: qty 14

## 2021-08-11 MED ORDER — MAGNESIUM SULFATE 4 GM/100ML IV SOLN
4.0000 g | Freq: Once | INTRAVENOUS | Status: AC
  Administered 2021-08-11: 4 g via INTRAVENOUS
  Filled 2021-08-11: qty 100

## 2021-08-11 MED ORDER — SODIUM CHLORIDE 0.9 % IV SOLN: 250.0000 mL | INTRAVENOUS | Status: DC | PRN

## 2021-08-11 MED ORDER — ALBUTEROL SULFATE (2.5 MG/3ML) 0.083% IN NEBU: 2.5000 mg | INHALATION_SOLUTION | Freq: Four times a day (QID) | RESPIRATORY_TRACT | Status: DC | PRN

## 2021-08-11 MED ORDER — LIRAGLUTIDE 18 MG/3ML ~~LOC~~ SOPN: 1.2000 mg | PEN_INJECTOR | Freq: Every morning | SUBCUTANEOUS | Status: DC

## 2021-08-11 MED ORDER — APIXABAN 5 MG PO TABS
5.0000 mg | ORAL_TABLET | Freq: Two times a day (BID) | ORAL | Status: DC
  Administered 2021-08-11 – 2021-08-14 (×6): 5 mg via ORAL
  Filled 2021-08-11 (×6): qty 1

## 2021-08-11 MED ORDER — METOPROLOL SUCCINATE ER 100 MG PO TB24
100.0000 mg | ORAL_TABLET | Freq: Every day | ORAL | Status: DC
  Administered 2021-08-12 – 2021-08-14 (×3): 100 mg via ORAL
  Filled 2021-08-11 (×3): qty 1

## 2021-08-11 MED ORDER — METFORMIN HCL 500 MG PO TABS
500.0000 mg | ORAL_TABLET | Freq: Two times a day (BID) | ORAL | Status: DC
  Administered 2021-08-11 – 2021-08-14 (×5): 500 mg via ORAL
  Filled 2021-08-11 (×5): qty 1

## 2021-08-11 MED ORDER — SODIUM CHLORIDE 0.9% FLUSH
3.0000 mL | Freq: Two times a day (BID) | INTRAVENOUS | Status: DC
  Administered 2021-08-12 – 2021-08-14 (×5): 3 mL via INTRAVENOUS

## 2021-08-11 MED ORDER — INSULIN GLARGINE-YFGN 100 UNIT/ML ~~LOC~~ SOLN
65.0000 [IU] | Freq: Every day | SUBCUTANEOUS | Status: DC
  Administered 2021-08-12 – 2021-08-14 (×3): 65 [IU] via SUBCUTANEOUS
  Filled 2021-08-11 (×3): qty 0.65

## 2021-08-11 NOTE — Care Management (Signed)
1601 08-11-21 Patient presented for Tikosyn Load. Benefits check submitted for Tikosyn cost. Case Manager will follow for cost and pharmacy of choice.

## 2021-08-11 NOTE — H&P (Signed)
Primary Care Physician: Abigail Miyamoto, MD Primary Cardiologist: Dr Dulce Sellar Primary Electrophysiologist: Dr Lalla Brothers Referring Physician: Dr Blythe Stanford [place] Serpe is a 55 y.o. male with a history of bicuspid AV, chronic systolic CHF, cirrhosis, COPD, HTN, DM, atrial fibrillation who presents for consultation in the [facility]. Patient is on Eliquis for a CHADS2VASC score of 3. Patient underwent DCCV 08/2020 which was not successful at restoring SR. He was seen by Dr Lalla Brothers on 07/15/21 for Watchman consideration given his cirrhosis. Dr Lalla Brothers also recommended dofetilide loading for rhythm control. Patient presents today for dofetilide admission. He denies any missed doses of anticoagulation in the last 3 weeks.    Today, he denies symptoms of palpitations, chest pain, orthopnea, PND, lower extremity edema, dizziness, presyncope, syncope, snoring, daytime somnolence, bleeding, or neurologic sequela. The patient is tolerating medications without difficulties and is otherwise without complaint today.      Atrial Fibrillation Risk Factors:   he does not have symptoms or diagnosis of sleep apnea. he does not have a history of rheumatic fever.     he has a BMI of Body mass index is 38.31 kg/m.Marland Kitchen    Filed Weights    08/11/21 1130  Weight: 104.4 kg           Family History  Problem Relation Age of Onset   Cancer Maternal Grandfather     Liver disease Sister     Alzheimer's disease Other     Memory loss Other     Hypertension Other          Atrial Fibrillation Management history:   Previous antiarrhythmic drugs: none Previous cardioversions: 09/24/20 Previous ablations: none CHADS2VASC score: 3 Anticoagulation history: Eliquis         Past Medical History:  Diagnosis Date   Atrial fibrillation with rapid ventricular response (HCC) 08/12/2020   Bicuspid aortic valve     Chronic hepatitis C virus infection (HCC) 08/03/2013   COPD  (chronic obstructive pulmonary disease) (HCC)     Diabetes mellitus type 2, insulin dependent (HCC)     Essential hypertension 07/03/2015    Last Assessment & Plan:  Blood pressure is borderline high.  Advised to get better sleep, walk on a regular basis, decrease weight down to about 210 and maintain, and eliminate tobacco.   HFrEF (heart failure with reduced ejection fraction) (HCC) 08/22/2020   Long-term insulin use (HCC) 10/12/2016   Obesity, unspecified 10/12/2016    Patient advised to lose down to about 210 pounds and maintained.  Regular walking program recommended.  He knows how to lose weight.   Sleep apnea           Past Surgical History:  Procedure Laterality Date   CARDIOVERSION N/A 09/24/2020    Procedure: CARDIOVERSION;  Surgeon: [name], MD;  Location: [facility] ENDOSCOPY;  Service: Cardiovascular;  Laterality: N/A;   CCTA   08/2019   TEE WITHOUT CARDIOVERSION   09/24/2020    Procedure: TRANSESOPHAGEAL ECHOCARDIOGRAM (TEE);  Surgeon: [name], MD;  Location[facility] ENDOSCOPY;  Service: Cardiovascular;;            Current Outpatient Medications  Medication Sig Dispense Refill   albuterol (PROVENTIL) (2.5 MG/3ML) 0.083% nebulizer solution Take 3 mLs (2.5 mg total) by nebulization every 6 (six) hours as needed for wheezing or shortness of breath. 150 mL 3   ALPRAZolam (XANAX) 0.5 MG tablet Take 1 tablet (0.5 mg total) by mouth 3 (three)  times daily as needed for anxiety. 90 tablet 3   atorvastatin (LIPITOR) 40 MG tablet TAKE 1 TABLET BY MOUTH EVERY DAY 90 tablet 2   BD PEN NEEDLE NANO 2ND GEN 32G X 4 MM MISC USE AS DIRECTED ONCE DAILY 100 each 4   ELIQUIS 5 MG TABS tablet TAKE 1 TABLET(5 MG) BY MOUTH TWICE DAILY 180 tablet 1   glucose blood (CONTOUR NEXT TEST) test strip 1 each by Other route as needed for other. Use as instructed 100 each 4   metFORMIN (GLUCOPHAGE) 500 MG tablet TAKE 1 TABLET(500 MG) BY MOUTH TWICE DAILY WITH A MEAL 180 tablet 2    metoprolol succinate (TOPROL-XL) 100 MG 24 hr tablet Take 1 tablet (100 mg total) by mouth daily. 90 tablet 3   Microlet Lancets MISC USE TO CHECK BLOOD SUGAR EVERY MORNING AND AFTER MEALS       sacubitril-valsartan (ENTRESTO) 49-51 MG Take 1 tablet by mouth 2 (two) times daily. 180 tablet 3   TRESIBA FLEXTOUCH 100 UNIT/ML FlexTouch Pen ADMINISTER 62 UNITS UNDER THE SKIN DAILY (Patient taking differently: Inject 65 Units into the skin every morning.) 18 mL 6   umeclidinium-vilanterol (ANORO ELLIPTA) 62.5-25 MCG/INH AEPB Inhale 1 puff into the lungs every morning.       VICTOZA 18 MG/3ML SOPN ADMINISTER 1.2 MG UNDER THE SKIN DAILY (Patient taking differently: Inject 1.2 mg into the skin every morning.) 12 mL 6   zolpidem (AMBIEN) 10 MG tablet Take 1 tablet (10 mg total) by mouth at bedtime as needed for sleep. 30 tablet 3    No current facility-administered medications for this encounter.      No Known Allergies   Social History         Socioeconomic History   Marital status: Widowed      Spouse name: Not on file   Number of children: Not on file   Years of education: Not on file   Highest education level: Not on file  Occupational History   Not on file  Tobacco Use   Smoking status: Former   Smokeless tobacco: Former      Types: Snuff   Tobacco comments:      Former snuff 08/11/2021  Vaping Use   Vaping Use: Never used  Substance and Sexual Activity   Alcohol use: Not Currently   Drug use: Never   Sexual activity: Not on file  Other Topics Concern   Not on file  Social History Narrative   Not on file    Social Determinants of Health    Financial Resource Strain: Not on file  Food Insecurity: Not on file  Transportation Needs: Not on file  Physical Activity: Not on file  Stress: Not on file  Social Connections: Not on file  Intimate Partner Violence: Not on file        ROS- All systems are reviewed and negative except as per the HPI above.   Physical Exam:     Vitals:    08/11/21 1130  BP: (!) 132/98  Pulse: 93  Weight: 104.4 kg  Height: 5\' 5"  (1.651 m)      GEN- The patient is a well appearing obese male, alert and oriented x 3 today.   Head- normocephalic, atraumatic Eyes-  Sclera clear, conjunctiva pink Ears- hearing intact Oropharynx- clear Neck- supple  Lungs- Clear to ausculation bilaterally, normal work of breathing Heart- irregular rate and rhythm, no murmurs, rubs or gallops  GI- soft, NT, ND, + BS Extremities-  no clubbing, cyanosis, or edema MS- no significant deformity or atrophy Skin- no rash or lesion Psych- euthymic mood, full affect Neuro- strength and sensation are intact      Wt Readings from Last 3 Encounters:  08/11/21 104.4 kg  07/15/21 106.4 kg  07/03/21 102.1 kg      EKG today demonstrates  Afib Vent. rate 93 BPM PR interval * ms QRS duration 102 ms QT/QTcB 342/425 ms   Echo 05/13/21 demonstrated  1. Left ventricular ejection fraction, by estimation, is 45 to 50%. The  left ventricle has mildly decreased function. Left ventricular diastolic  parameters are indeterminate.   2. Right ventricular systolic function is normal. The right ventricular  size is normal. There is normal pulmonary artery systolic pressure.   3. Left atrial size was mildly dilated.   4. Right atrial size was mildly dilated.   5. The mitral valve is normal in structure. Trivial mitral valve  regurgitation. No evidence of mitral stenosis.   6. Aortic valve regurgitation is not visualized. Moderate aortic valve  stenosis.   7. 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    Epic records are reviewed at length today   CHA2DS2-VASc Score = 3  The patient's score is based upon: CHF History: 1 HTN History: 1 Diabetes History: 1 Stroke History: 0 Vascular Disease History: 0 Age Score: 0 Gender Score: 0        ASSESSMENT AND PLAN: 1. Persistent Atrial Fibrillation (ICD10:  I48.19) The patient's CHA2DS2-VASc score is 3, indicating a 3.2% annual  risk of stroke. Patient presents for dofetilide admission.  Patient aware of price of dofetilide. Continue Eliquis 5 mg BID, states no missed doses in the last 3 weeks. No recent benadryl use PharmD has screened medications Labs today show creatinine at 1.02, K+ 4.5 and mag 1.7, CrCl calculated at 119 mL/min Continue Toprol 100 mg daily   2. Secondary Hypercoagulable State (ICD10:  D68.69) The patient is at significant risk for stroke/thromboembolism based upon his CHA2DS2-VASc Score of 3.  Continue Apixaban (Eliquis).    3. Obesity Body mass index is 38.31 kg/m. Lifestyle modification was discussed at length including regular exercise and weight reduction.   4. Valvular heart disease Bicuspid AV, followed by Dr Dulce Sellar.    5. HFmrEF No signs or symptoms of fluid overload today.     To be admitted later today once a bed becomes available.     Jorja Loa PA-C [facility] [address] [place], [place] [address] [phone] 08/11/2021 1:13 PM       ---------------------------------------------------------------  I have seen, examined the patient, and reviewed the above assessment and plan.    Mr. Saccente is doing well today.  He presents for his dofetilide initiation.  His QTC is within acceptable range.  I have personally reviewed his EKG.  Creatinine is stable for starting Tikosyn.  We will plan for dofetilide 500 mcg by mouth twice daily.   Lanier Prude, MD 08/11/2021 9:11 PM

## 2021-08-11 NOTE — Progress Notes (Signed)
Primary Care Physician: Abigail Miyamoto, MD Primary Cardiologist: Dr Dulce Sellar Primary Electrophysiologist: Dr Lalla Brothers Referring Physician: Dr Blythe Stanford Marcus Hicks is a 55 y.o. male with a history of bicuspid AV, chronic systolic CHF, cirrhosis, COPD, HTN, DM, atrial fibrillation who presents for consultation in the [facility]. Patient is on Eliquis for a CHADS2VASC score of 3. Patient underwent DCCV 08/2020 which was not successful at restoring SR. He was seen by Dr Lalla Brothers on 07/15/21 for Watchman consideration given his cirrhosis. Dr Lalla Brothers also recommended dofetilide loading for rhythm control. Patient presents today for dofetilide admission. He denies any missed doses of anticoagulation in the last 3 weeks.   Today, he denies symptoms of palpitations, chest pain, orthopnea, PND, lower extremity edema, dizziness, presyncope, syncope, snoring, daytime somnolence, bleeding, or neurologic sequela. The patient is tolerating medications without difficulties and is otherwise without complaint today.    Atrial Fibrillation Risk Factors:  he does not have symptoms or diagnosis of sleep apnea. he does not have a history of rheumatic fever.   he has a BMI of Body mass index is 38.31 kg/m.Marland Kitchen Filed Weights   08/11/21 1130  Weight: 104.4 kg    Family History  Problem Relation Age of Onset   Cancer Maternal Grandfather    Liver disease Sister    Alzheimer's disease Other    Memory loss Other    Hypertension Other      Atrial Fibrillation Management history:  Previous antiarrhythmic drugs: none Previous cardioversions: 09/24/20 Previous ablations: none CHADS2VASC score: 3 Anticoagulation history: Eliquis   Past Medical History:  Diagnosis Date   Atrial fibrillation with rapid ventricular response (HCC) 08/12/2020   Bicuspid aortic valve    Chronic hepatitis C virus infection (HCC) 08/03/2013   COPD (chronic obstructive pulmonary disease)  (HCC)    Diabetes mellitus type 2, insulin dependent (HCC)    Essential hypertension 07/03/2015   Last Assessment & Plan:  Blood pressure is borderline high.  Advised to get better sleep, walk on a regular basis, decrease weight down to about 210 and maintain, and eliminate tobacco.   HFrEF (heart failure with reduced ejection fraction) (HCC) 08/22/2020   Long-term insulin use (HCC) 10/12/2016   Obesity, unspecified 10/12/2016   Patient advised to lose down to about 210 pounds and maintained.  Regular walking program recommended.  He knows how to lose weight.   Sleep apnea    Past Surgical History:  Procedure Laterality Date   CARDIOVERSION N/A 09/24/2020   Procedure: CARDIOVERSION;  Surgeon: [name], MD;  Location: [facility] ENDOSCOPY;  Service: Cardiovascular;  Laterality: N/A;   CCTA  08/2019   TEE WITHOUT CARDIOVERSION  09/24/2020   Procedure: TRANSESOPHAGEAL ECHOCARDIOGRAM (TEE);  Surgeon: [name], MD;  Location: [facility] ENDOSCOPY;  Service: Cardiovascular;;    Current Outpatient Medications  Medication Sig Dispense Refill   albuterol (PROVENTIL) (2.5 MG/3ML) 0.083% nebulizer solution Take 3 mLs (2.5 mg total) by nebulization every 6 (six) hours as needed for wheezing or shortness of breath. 150 mL 3   ALPRAZolam (XANAX) 0.5 MG tablet Take 1 tablet (0.5 mg total) by mouth 3 (three) times daily as needed for anxiety. 90 tablet 3   atorvastatin (LIPITOR) 40 MG tablet TAKE 1 TABLET BY MOUTH EVERY DAY 90 tablet 2   BD PEN NEEDLE NANO 2ND GEN 32G X 4 MM MISC USE AS DIRECTED ONCE DAILY 100 each 4   ELIQUIS 5 MG TABS tablet TAKE 1 TABLET(5 MG)  BY MOUTH TWICE DAILY 180 tablet 1   glucose blood (CONTOUR NEXT TEST) test strip 1 each by Other route as needed for other. Use as instructed 100 each 4   metFORMIN (GLUCOPHAGE) 500 MG tablet TAKE 1 TABLET(500 MG) BY MOUTH TWICE DAILY WITH A MEAL 180 tablet 2   metoprolol succinate (TOPROL-XL) 100 MG 24 hr tablet Take 1 tablet (100 mg  total) by mouth daily. 90 tablet 3   Microlet Lancets MISC USE TO CHECK BLOOD SUGAR EVERY MORNING AND AFTER MEALS     sacubitril-valsartan (ENTRESTO) 49-51 MG Take 1 tablet by mouth 2 (two) times daily. 180 tablet 3   TRESIBA FLEXTOUCH 100 UNIT/ML FlexTouch Pen ADMINISTER 62 UNITS UNDER THE SKIN DAILY (Patient taking differently: Inject 65 Units into the skin every morning.) 18 mL 6   umeclidinium-vilanterol (ANORO ELLIPTA) 62.5-25 MCG/INH AEPB Inhale 1 puff into the lungs every morning.     VICTOZA 18 MG/3ML SOPN ADMINISTER 1.2 MG UNDER THE SKIN DAILY (Patient taking differently: Inject 1.2 mg into the skin every morning.) 12 mL 6   zolpidem (AMBIEN) 10 MG tablet Take 1 tablet (10 mg total) by mouth at bedtime as needed for sleep. 30 tablet 3   No current facility-administered medications for this encounter.    No Known Allergies  Social History   Socioeconomic History   Marital status: Widowed    Spouse name: Not on file   Number of children: Not on file   Years of education: Not on file   Highest education level: Not on file  Occupational History   Not on file  Tobacco Use   Smoking status: Former   Smokeless tobacco: Former    Types: Snuff   Tobacco comments:    Former snuff 08/11/2021  Vaping Use   Vaping Use: Never used  Substance and Sexual Activity   Alcohol use: Not Currently   Drug use: Never   Sexual activity: Not on file  Other Topics Concern   Not on file  Social History Narrative   Not on file   Social Determinants of Health   Financial Resource Strain: Not on file  Food Insecurity: Not on file  Transportation Needs: Not on file  Physical Activity: Not on file  Stress: Not on file  Social Connections: Not on file  Intimate Partner Violence: Not on file     ROS- All systems are reviewed and negative except as per the HPI above.  Physical Exam: Vitals:   08/11/21 1130  BP: (!) 132/98  Pulse: 93  Weight: 104.4 kg  Height: 5\' 5"  (1.651 m)     GEN- The patient is a well appearing obese male, alert and oriented x 3 today.   Head- normocephalic, atraumatic Eyes-  Sclera clear, conjunctiva pink Ears- hearing intact Oropharynx- clear Neck- supple  Lungs- Clear to ausculation bilaterally, normal work of breathing Heart- irregular rate and rhythm, no murmurs, rubs or gallops  GI- soft, NT, ND, + BS Extremities- no clubbing, cyanosis, or edema MS- no significant deformity or atrophy Skin- no rash or lesion Psych- euthymic mood, full affect Neuro- strength and sensation are intact  Wt Readings from Last 3 Encounters:  08/11/21 104.4 kg  07/15/21 106.4 kg  07/03/21 102.1 kg    EKG today demonstrates  Afib Vent. rate 93 BPM PR interval * ms QRS duration 102 ms QT/QTcB 342/425 ms  Echo 05/13/21 demonstrated  1. Left ventricular ejection fraction, by estimation, is 45 to 50%. The  left ventricle has  mildly decreased function. Left ventricular diastolic  parameters are indeterminate.   2. Right ventricular systolic function is normal. The right ventricular  size is normal. There is normal pulmonary artery systolic pressure.   3. Left atrial size was mildly dilated.   4. Right atrial size was mildly dilated.   5. The mitral valve is normal in structure. Trivial mitral valve  regurgitation. No evidence of mitral stenosis.   6. Aortic valve regurgitation is not visualized. Moderate aortic valve  stenosis.   7. 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   Epic records are reviewed at length today  CHA2DS2-VASc Score = 3  The patient's score is based upon: CHF History: 1 HTN History: 1 Diabetes History: 1 Stroke History: 0 Vascular Disease History: 0 Age Score: 0 Gender Score: 0      ASSESSMENT AND PLAN: 1. Persistent Atrial Fibrillation (ICD10:  I48.19) The patient's CHA2DS2-VASc score is 3, indicating a 3.2% annual risk of stroke. Patient presents for dofetilide admission.  Patient aware of price of dofetilide. Continue  Eliquis 5 mg BID, states no missed doses in the last 3 weeks. No recent benadryl use PharmD has screened medications Labs today show creatinine at 1.02, K+ 4.5 and mag 1.7, CrCl calculated at 119 mL/min Continue Toprol 100 mg daily  2. Secondary Hypercoagulable State (ICD10:  D68.69) The patient is at significant risk for stroke/thromboembolism based upon his CHA2DS2-VASc Score of 3.  Continue Apixaban (Eliquis).   3. Obesity Body mass index is 38.31 kg/m. Lifestyle modification was discussed at length including regular exercise and weight reduction.  4. Valvular heart disease Bicuspid AV, followed by Dr Dulce Sellar.   5. HFmrEF No signs or symptoms of fluid overload today.   To be admitted later today once a bed becomes available.   Jorja Loa PA-C [facility] [address] [place], [place] [address] [phone] 08/11/2021 1:13 PM

## 2021-08-11 NOTE — TOC Benefit Eligibility Note (Signed)
Transition of Care [facility]) Benefit Eligibility Note    Patient Details  Name: Marcus Hicks MRN: [id number] Date of Birth: February 14, 1966   Medication/Dose: Phyllis Ginger  CAPSULE  125 MCG BID   , 250 MCG BID   ,500 MCG BID  Covered?: Yes  Tier: 3 Drug  Prescription Coverage Preferred Pharmacy: [facility] with Person/Company/Phone Number:: JESSICA  @ [employer] #  [phone]  Co-Pay: ZERO DOLLARS  Prior Approval: No  Deductible: Met (OUT-OF-POCKET:MET)  Additional Notes: DOFETILIDE  CAPSULE  125 MCG BID  , 250 MCG BID  , 500 MCG BID : COVER- YES , CO-PAY- ZERO DOLLARS  , TIER- 4 DRUG , P/A-NO    Memory Argue Phone Number: 08/11/2021, 5:09 PM

## 2021-08-11 NOTE — Progress Notes (Signed)
EKG and labs reviewed Mag 4g has been ordered for mag level 1.7  No need to recheck prior to start of drug this evening  Francis Dowse, PA-C

## 2021-08-12 ENCOUNTER — Other Ambulatory Visit: Payer: Self-pay | Admitting: Legal Medicine

## 2021-08-12 DIAGNOSIS — Z79899 Other long term (current) drug therapy: Secondary | ICD-10-CM

## 2021-08-12 DIAGNOSIS — J449 Chronic obstructive pulmonary disease, unspecified: Secondary | ICD-10-CM

## 2021-08-12 LAB — BASIC METABOLIC PANEL
Anion gap: 9 (ref 5–15)
BUN: 13 mg/dL (ref 6–20)
CO2: 31 mmol/L (ref 22–32)
Calcium: 9.2 mg/dL (ref 8.9–10.3)
Chloride: 99 mmol/L (ref 98–111)
Creatinine, Ser: 0.87 mg/dL (ref 0.61–1.24)
GFR, Estimated: >60 mL/min (ref >60)
Glucose, Bld: 81 mg/dL (ref 70–99)
Potassium: 3.4 mmol/L — ABNORMAL LOW (ref 3.5–5.1)
Sodium: 139 mmol/L (ref 135–145)

## 2021-08-12 LAB — MAGNESIUM: Magnesium: 2.1 mg/dL (ref 1.7–2.4)

## 2021-08-12 LAB — GLUCOSE, CAPILLARY
Glucose-Capillary: 89 mg/dL (ref 70–99)
Glucose-Capillary: 156 mg/dL — ABNORMAL HIGH (ref 70–99)
Glucose-Capillary: 109 mg/dL — ABNORMAL HIGH (ref 70–99)
Glucose-Capillary: 221 mg/dL — ABNORMAL HIGH (ref 70–99)

## 2021-08-12 LAB — SARS CORONAVIRUS 2 (TAT 6-24 HRS): SARS Coronavirus 2: NEGATIVE

## 2021-08-12 LAB — POTASSIUM: Potassium: 4.2 mmol/L (ref 3.5–5.1)

## 2021-08-12 MED ORDER — POTASSIUM CHLORIDE CRYS ER 20 MEQ PO TBCR
40.0000 meq | EXTENDED_RELEASE_TABLET | Freq: Once | ORAL | Status: AC
  Administered 2021-08-12: 40 meq via ORAL
  Filled 2021-08-12: qty 2

## 2021-08-12 NOTE — Progress Notes (Signed)
Progress Note  Patient Name: Montrail Mehrer Date of Encounter: 08/12/2021  [facility] Cardiologist: Dr. Sidonie Dickens  Subjective   No c/o  Inpatient Medications    Scheduled Meds:  apixaban  5 mg Oral BID   atorvastatin  40 mg Oral QHS   dofetilide  500 mcg Oral BID   insulin glargine-yfgn  65 Units Subcutaneous Daily   metFORMIN  500 mg Oral BID WC   metoprolol succinate  100 mg Oral Daily   potassium chloride  40 mEq Oral Once   sacubitril-valsartan  1 tablet Oral BID   sodium chloride flush  3 mL Intravenous Q12H   umeclidinium-vilanterol  1 puff Inhalation q morning   Continuous Infusions:  sodium chloride     PRN Meds: sodium chloride, albuterol, ALPRAZolam, sodium chloride flush, zolpidem   Vital Signs    Vitals:   08/11/21 1813 08/11/21 1934 08/12/21 0014 08/12/21 0503  BP:  137/83 117/75 115/76  Pulse:  92 89 85  Resp:  18 18 18   Temp:  98.3 F (36.8 C) 98 F (36.7 C) 97.6 F (36.4 C)  TempSrc:  Oral Oral Oral  SpO2:  100%    Weight: 104.4 kg     Height: 5\' 5"  (1.651 m)       Intake/Output Summary (Last 24 hours) at 08/12/2021 0733 Last data filed at 08/11/2021 2024 Gross per 24 hour  Intake 360 ml  Output --  Net 360 ml   Last 3 Weights 08/11/2021 08/11/2021 07/15/2021  Weight (lbs) 230 lb 2.6 oz 230 lb 3.2 oz 234 lb 9.6 oz  Weight (kg) 104.4 kg 104.418 kg 106.414 kg      Telemetry    Afib 70's-100's - Personally Reviewed  ECG    AFib 101bpm, Qtc 10/11/2021 - Personally Reviewed  Physical Exam   GEN: No acute distress.   Neck: No JVD Cardiac: irreg-irreg, 2/6 SM, rubs, or gallops.  Respiratory: CTA b/l. GI: Soft, nontender, non-distended  MS: No edema; No deformity. Neuro:  Nonfocal  Psych: Normal affect   Labs    High Sensitivity Troponin:  No results for input(s): TROPONINIHS in the last 720 hours.    Chemistry Recent Labs  Lab 08/11/21 1136 08/12/21 0214  NA 139 139  K 4.5 3.4*  CL 99 99  CO2 30 31  GLUCOSE 166* 81   BUN 13 13  CREATININE 1.02 0.87  CALCIUM 9.7 9.2  GFRNONAA >60 >60  ANIONGAP 10 9     HematologyNo results for input(s): WBC, RBC, HGB, HCT, MCV, MCH, MCHC, RDW, PLT in the last 168 hours.  BNPNo results for input(s): BNP, PROBNP in the last 168 hours.   DDimer No results for input(s): DDIMER in the last 168 hours.   Radiology    No results found.  Cardiac Studies   Echo 05/13/21   1. Left ventricular ejection fraction, by estimation, is 45 to 50%. The  left ventricle has mildly decreased function. Left ventricular diastolic  parameters are indeterminate.   2. Right ventricular systolic function is normal. The right ventricular  size is normal. There is normal pulmonary artery systolic pressure.   3. Left atrial size was mildly dilated.   4. Right atrial size was mildly dilated.   5. The mitral valve is normal in structure. Trivial mitral valve  regurgitation. No evidence of mitral stenosis.   6. Aortic valve regurgitation is not visualized. Moderate aortic valve  stenosis.   7. 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  Patient Profile     55 y.o. male chronic CHF (systolic), bicuspid AV, COPD, HTN, DM, cirrhosis, and AFib admitted for tikosyn initation  Assessment & Plan    Persistent Afib CHA2DS2Vasc is 3, on Eliquis Tikosyn load is in progress K+ 3.4 replacement and recheck ordered Mag 2.1 Creat 0.87 QTc stable  DCCV tomorrow if not in SR Pt aware and agreeable  2. HTN Home meds  3. DM Home regime  4. COPD Home regime  For questions or updates, please contact [facility] Please consult [email or web address] for contact info under        Signed, Sheilah Pigeon, PA-C  08/12/2021, 7:33 AM

## 2021-08-12 NOTE — Progress Notes (Signed)
Post dose EKG is reviewed with Dr. Lalla Brothers Afib 94bpm, manually measured QT is just less then . QTc 501 or less  OK to continue Tikosyn this evening  Repeat K+ level is pending, further replacement as needed Scheduled for DCCV tomorrow NPO after MN  Francis Dowse, PA-C

## 2021-08-12 NOTE — Progress Notes (Signed)
Pharmacy: Dofetilide (Tikosyn) - Initial Consult Assessment and Electrolyte Replacement  Pharmacy consulted to assist in monitoring and replacing electrolytes in this 55 y.o. male admitted on 08/11/2021 undergoing dofetilide initiation.   Assessment:  Patient Exclusion Criteria: If any screening criteria checked as "Yes", then  patient  should NOT receive dofetilide until criteria item is corrected.  If "Yes" please indicate correction plan.  YES  NO Patient  Exclusion Criteria Correction Plan   [x]   []   Baseline QTc interval is greater than or equal to 440 msec. IF above YES box checked dofetilide contraindicated unless patient has ICD; then may proceed if QTc 500-550 msec or with known ventricular conduction abnormalities may proceed with QTc 550-600 msec. QTc =  501 Continue Tikosyn per EP   []   [x]   Patient is known or suspected to have a digoxin level greater than 2 ng/ml: No results found for: DIGOXIN     []   [x]   Creatinine clearance less than 20 ml/min (calculated using Cockcroft-Gault, actual body weight and serum creatinine): Estimated Creatinine Clearance: 105.5 mL/min (by C-G formula based on SCr of 0.87 mg/dL).     []   [x]  Patient has received drugs known to prolong the QT intervals within the last 48 hours (phenothiazines, tricyclics or tetracyclic antidepressants, erythromycin, H-1 antihistamines, cisapride, fluoroquinolones, azithromycin, ondansetron).   Updated information on QT prolonging agents is available to be searched on the following database:QT prolonging agents     []   [x]   Patient received a dose of hydrochlorothiazide (Oretic) alone or in any combination including triamterene (Dyazide, Maxzide) in the last 48 hours.    []   [x]  Patient received a medication known to increase dofetilide plasma concentrations prior to initial dofetilide dose:  Trimethoprim (Primsol, Proloprim) in the last 36 hours Verapamil (Calan, Verelan) in the last 36 hours or a  sustained release dose in the last 72 hours Megestrol (Megace) in the last 5 days  Cimetidine (Tagamet) in the last 6 hours Ketoconazole (Nizoral) in the last 24 hours Itraconazole (Sporanox) in the last 48 hours  Prochlorperazine (Compazine) in the last 36 hours     []   [x]   Patient is known to have a history of torsades de pointes; congenital or acquired long QT syndromes.    []   [x]   Patient has received a Class 1 antiarrhythmic with less than 2 half-lives since last dose. (Disopyramide, Quinidine, Procainamide, Lidocaine, Mexiletine, Flecainide, Propafenone)    []   [x]   Patient has received amiodarone therapy in the past 3 months or amiodarone level is greater than 0.3 ng/ml.    Patient has been appropriately anticoagulated with apixaban.  Labs:    Component Value Date/Time   K 4.2 08/12/2021 1313   MG 2.1 08/12/2021 0214     Plan: Potassium: K >/= 4: Appropriate to initiate Tikosyn, no replacement needed    Magnesium: Mg >2: Appropriate to initiate Tikosyn, no replacement needed     Thank you for allowing pharmacy to participate in this patient's care   , PharmD Clinical Pharmacist **Pharmacist phone directory can now be found on [email or web address] ([email or web address]).  Listed under [facility].

## 2021-08-12 NOTE — Care Management (Signed)
1258 08-12-21 Case Manager spoke with the patient and he wants his first Rx fill to be sent to [facility] and Rx refills sent to [facility]. No further needs from Case Manager at this time.

## 2021-08-13 ENCOUNTER — Encounter (HOSPITAL_COMMUNITY): Payer: Self-pay | Admitting: Cardiology

## 2021-08-13 ENCOUNTER — Inpatient Hospital Stay (HOSPITAL_COMMUNITY): Payer: 59 | Admitting: Anesthesiology

## 2021-08-13 ENCOUNTER — Encounter (HOSPITAL_COMMUNITY)
Admission: RE | Disposition: A | Discharge status: Home / Self Care | Payer: Self-pay | Source: Ambulatory Visit | Attending: Cardiology

## 2021-08-13 HISTORY — PX: CARDIOVERSION: SHX1299

## 2021-08-13 LAB — GLUCOSE, CAPILLARY
Glucose-Capillary: 91 mg/dL (ref 70–99)
Glucose-Capillary: 118 mg/dL — ABNORMAL HIGH (ref 70–99)
Glucose-Capillary: 230 mg/dL — ABNORMAL HIGH (ref 70–99)
Glucose-Capillary: 165 mg/dL — ABNORMAL HIGH (ref 70–99)

## 2021-08-13 LAB — BASIC METABOLIC PANEL
Anion gap: 11 (ref 5–15)
BUN: 13 mg/dL (ref 6–20)
CO2: 29 mmol/L (ref 22–32)
Calcium: 9.5 mg/dL (ref 8.9–10.3)
Chloride: 99 mmol/L (ref 98–111)
Creatinine, Ser: 0.9 mg/dL (ref 0.61–1.24)
GFR, Estimated: >60 mL/min (ref >60)
Glucose, Bld: 103 mg/dL — ABNORMAL HIGH (ref 70–99)
Potassium: 3.9 mmol/L (ref 3.5–5.1)
Sodium: 139 mmol/L (ref 135–145)

## 2021-08-13 LAB — MAGNESIUM: Magnesium: 1.6 mg/dL — ABNORMAL LOW (ref 1.7–2.4)

## 2021-08-13 SURGERY — CARDIOVERSION
Anesthesia: General

## 2021-08-13 MED ORDER — MAGNESIUM SULFATE 4 GM/100ML IV SOLN
4.0000 g | Freq: Once | INTRAVENOUS | Status: AC
  Administered 2021-08-13: 4 g via INTRAVENOUS
  Filled 2021-08-13: qty 100

## 2021-08-13 MED ORDER — LIDOCAINE 2% (20 MG/ML) 5 ML SYRINGE
INTRAMUSCULAR | Status: DC | PRN
  Administered 2021-08-13: 40 mg via INTRAVENOUS

## 2021-08-13 MED ORDER — POTASSIUM CHLORIDE CRYS ER 20 MEQ PO TBCR
40.0000 meq | EXTENDED_RELEASE_TABLET | Freq: Once | ORAL | Status: AC
  Administered 2021-08-13: 40 meq via ORAL
  Filled 2021-08-13: qty 2

## 2021-08-13 MED ORDER — PROPOFOL 10 MG/ML IV BOLUS
INTRAVENOUS | Status: DC | PRN
  Administered 2021-08-13 (×2): 30 mg via INTRAVENOUS
  Administered 2021-08-13: 50 mg via INTRAVENOUS
  Administered 2021-08-13: 20 mg via INTRAVENOUS

## 2021-08-13 NOTE — Anesthesia Preprocedure Evaluation (Signed)
Anesthesia Evaluation  Patient identified by MRN, date of birth, ID band Patient awake    Reviewed: Allergy & Precautions, NPO status , Patient's Chart, lab work & pertinent test results, reviewed documented beta blocker date and time   Airway Mallampati: II  TM Distance: >3 FB Neck ROM: Full    Dental  (+) Teeth Intact, Dental Advisory Given   Pulmonary sleep apnea , COPD, former smoker,    Pulmonary exam normal        Cardiovascular hypertension, Pt. on home beta blockers and Pt. on medications  Rhythm:Irregular Rate:Abnormal     Neuro/Psych negative neurological ROS  negative psych ROS   GI/Hepatic negative GI ROS, (+) Hepatitis -  Endo/Other  diabetes, Type 2, Oral Hypoglycemic Agents  Renal/GU      Musculoskeletal   Abdominal   Peds  Hematology   Anesthesia Other Findings   Reproductive/Obstetrics                             Anesthesia Physical Anesthesia Plan  ASA: 3  Anesthesia Plan: General   Post-op Pain Management:    Induction:   PONV Risk Score and Plan: 0  Airway Management Planned: Natural Airway and Simple Face Mask  Additional Equipment: None  Intra-op Plan:   Post-operative Plan:   Informed Consent: I have reviewed the patients History and Physical, chart, labs and discussed the procedure including the risks, benefits and alternatives for the proposed anesthesia with the patient or authorized representative who has indicated his/her understanding and acceptance.       Plan Discussed with: CRNA  Anesthesia Plan Comments:         Anesthesia Quick Evaluation

## 2021-08-13 NOTE — Discharge Summary (Addendum)
ELECTROPHYSIOLOGY PROCEDURE DISCHARGE SUMMARY    Patient ID: Marcus Hicks,  MRN: [id number], DOB/AGE: 06-25-1966 55 y.o.  Admit date: 08/11/2021 Discharge date: 08/14/21  Primary Care Physician: Abigail Miyamoto, MD  Primary Cardiologist: Dr. Dulce Sellar Electrophysiologist: Dr. Elberta Fortis  Primary Discharge Diagnosis:  1.  persistent atrial fibrillation status post Tikosyn loading this admission      CHA2DS2Vasc is 3, on Eliquis  Secondary Discharge Diagnosis:  VHD w/bicuspid AV AS COPD HTN DM cirrhosis  No Known Allergies   Procedures This Admission:  1.  Tikosyn loading 2.  Direct current cardioversion on 08/13/21 by Dr Jacques Navy with each shock (x3) only a few beats of SR with quick return of AF.  There were no early apparent complications.   Brief HPI: Esaias Cleavenger is a 55 y.o. male with a past medical history as noted above.  They were referred to EP in the outpatient setting for treatment options of atrial fibrillation.  Risks, benefits, and alternatives to Tikosyn were reviewed with the patient who wished to proceed.    Hospital Course:  The patient was admitted and Tikosyn was initiated.  Renal function and electrolytes were followed during the hospitalization.  On 08/13/21 the patient underwent direct current cardioversion which resulted in only a few sinus beats with return of Afib each shock (x3).  His QT started to lengthen after #5 dose and given persistent Afib despite DCCV Tikosyn abandoned.    CTS evaluation was pursed to evaluate him for AVR/MAZE/LAA cliping candidacy/timing, recommendations. CTS recommends proceeding with AVR, LAA clipping and MAZE, his office with contact the patient for follow office fisit and arrangements for pre-operative testing/cath etc.  He was monitored until discharge on telemetry which demonstrated AFib generally with fair rate control, though had periods of 130's.  On the day of discharge, the patient feels well, was  examined by Dr Lalla Brothers who considered the patient stable for discharge to home.  Follow-up has been arranged with the AFib clinic in 1 week and with Dr Lalla Brothers in 4 weeks.   Will increase his Toprol to 150mg  daily for added rate control   Physical Exam: Vitals:   08/14/21 0022 08/14/21 0416 08/14/21 0915 08/14/21 1244  BP: (!) 136/100 121/78  122/87  Pulse: 84 (!) 138 (!) 133 96  Resp: 16 16 18 18   Temp: 98.5 F (36.9 C) 97.8 F (36.6 C)  98.5 F (36.9 C)  TempSrc: Oral Oral  Oral  SpO2: 97% 97% 98% 99%  Weight:      Height:         GEN- The patient is well appearing, alert and oriented x 3 today.   HEENT: normocephalic, atraumatic; sclera clear, conjunctiva pink; hearing intact; oropharynx clear; neck supple, no JVP Lymph- no cervical lymphadenopathy Lungs-  CTA b/l, normal work of breathing.  No wheezes, rales, rhonchi Heart- irreg-irreg, 2-3/6SM, rubs or gallops, PMI not laterally displaced GI- soft, non-tender, non-distended Extremities- no clubbing, cyanosis, or edema MS- no significant deformity or atrophy Skin- warm and dry, no rash or lesion Psych- euthymic mood, full affect Neuro- strength and sensation are intact   Labs:   Lab Results  Component Value Date   WBC 5.4 06/01/2021   HGB 13.8 06/01/2021   HCT 39.7 06/01/2021   MCV 91.3 06/01/2021   PLT 202 06/01/2021    Recent Labs  Lab 08/14/21 0330  NA 138  K 4.0  CL 103  CO2 29  BUN 13  CREATININE 0.84  CALCIUM  9.1  GLUCOSE 113*     Discharge Medications:  Allergies as of 08/14/2021   No Known Allergies      Medication List     TAKE these medications    albuterol (2.5 MG/3ML) 0.083% nebulizer solution Commonly known as: PROVENTIL USE 1 VIAL VIA NEBULIZER EVERY 6 HOURS AS NEEDED FOR WHEEZING OR SHORTNESS OF BREATH   ALPRAZolam 0.5 MG tablet Commonly known as: XANAX Take 1 tablet (0.5 mg total) by mouth 3 (three) times daily as needed for anxiety.   Anoro Ellipta 62.5-25 MCG/INH  Aepb Generic drug: umeclidinium-vilanterol Inhale 1 puff into the lungs every morning.   atorvastatin 40 MG tablet Commonly known as: LIPITOR TAKE 1 TABLET BY MOUTH EVERY DAY   BD Pen Needle Nano 2nd Gen 32G X 4 MM Misc Generic drug: Insulin Pen Needle USE AS DIRECTED ONCE DAILY   Contour Next Test test strip Generic drug: glucose blood 1 each by Other route as needed for other. Use as instructed   Eliquis 5 MG Tabs tablet Generic drug: apixaban TAKE 1 TABLET(5 MG) BY MOUTH TWICE DAILY What changed:  how much to take how to take this when to take this additional instructions   metFORMIN 500 MG tablet Commonly known as: GLUCOPHAGE TAKE 1 TABLET(500 MG) BY MOUTH TWICE DAILY WITH A MEAL What changed: See the new instructions.   metoprolol succinate 100 MG 24 hr tablet Commonly known as: TOPROL-XL Take 1.5 tablets (150 mg total) by mouth daily. Take with or immediately following a meal. Start taking on: August 15, 2021 What changed:  how much to take additional instructions   Microlet Lancets Misc USE TO CHECK BLOOD SUGAR EVERY MORNING AND AFTER MEALS   sacubitril-valsartan 49-51 MG Commonly known as: ENTRESTO Take 1 tablet by mouth 2 (two) times daily.   traMADol 50 MG tablet Commonly known as: ULTRAM Take 50 mg by mouth every 6 (six) hours as needed for severe pain.   Evaristo Bury FlexTouch 100 UNIT/ML FlexTouch Pen Generic drug: insulin degludec ADMINISTER 62 UNITS UNDER THE SKIN DAILY What changed: See the new instructions.   Victoza 18 MG/3ML Sopn Generic drug: liraglutide ADMINISTER 1.2 MG UNDER THE SKIN DAILY What changed: See the new instructions.   zolpidem 10 MG tablet Commonly known as: AMBIEN Take 1 tablet (10 mg total) by mouth at bedtime as needed for sleep.        Disposition: Home Discharge Instructions     Diet - low sodium heart healthy   Complete by: As directed    Increase activity slowly   Complete by: As directed         Follow-up Information     Lanier Prude, MD .   Specialties: Cardiology, Radiology Why: 09/15/21 @ 3:30PM Contact information: [address] [place] [address] [phone]         Corliss Skains, MD Follow up.   Specialty: Cardiothoracic Surgery Why: The office will call you to scheduled an appointment for next steps towards surgery Contact information: [address] [place] [address] [phone]                 Duration of Discharge Encounter: Greater than 30 minutes including physician time.  Norma Fredrickson, PA-C 08/14/2021 5:15 PM

## 2021-08-13 NOTE — Interval H&P Note (Signed)
History and Physical Interval Note:  08/13/2021 12:48 PM  Marcus Hicks  has presented today for surgery, with the diagnosis of afib.  The various methods of treatment have been discussed with the patient and family. After consideration of risks, benefits and other options for treatment, the patient has consented to  Procedure(s): CARDIOVERSION (N/A) as a surgical intervention.  The patient's history has been reviewed, patient examined, no change in status, stable for surgery.  I have reviewed the patient's chart and labs.  Questions were answered to the patient's satisfaction.     [name]

## 2021-08-13 NOTE — Transfer of Care (Signed)
Immediate Anesthesia Transfer of Care Note  Patient: Marcus Hicks  Procedure(s) Performed: CARDIOVERSION  Patient Location: Endoscopy Unit  Anesthesia Type:MAC  Level of Consciousness: awake  Airway & Oxygen Therapy: Patient Spontanous Breathing  Post-op Assessment: Report given to RN and Post -op Vital signs reviewed and stable  Post vital signs: Reviewed and stable  Last Vitals:  Vitals Value Taken Time  BP    Temp    Pulse    Resp    SpO2      Last Pain:  Vitals:   08/13/21 1249  TempSrc: Oral  PainSc: 0-No pain      Patients Stated Pain Goal: 0 (11/65/79 0383)  Complications: No notable events documented.

## 2021-08-13 NOTE — Anesthesia Postprocedure Evaluation (Signed)
Anesthesia Post Note  Patient: Marcus Hicks  Procedure(s) Performed: CARDIOVERSION     Patient location during evaluation: PACU Anesthesia Type: General Level of consciousness: awake and alert Pain management: pain level controlled Vital Signs Assessment: post-procedure vital signs reviewed and stable Respiratory status: spontaneous breathing, nonlabored ventilation, respiratory function stable and patient connected to nasal cannula oxygen Cardiovascular status: blood pressure returned to baseline and stable Postop Assessment: no apparent nausea or vomiting Anesthetic complications: no   No notable events documented.  Last Vitals:  Vitals:   08/13/21 1337 08/13/21 1352  BP: 102/79 103/86  Pulse: (!) 55 (!) 110  Resp: 16 16  Temp:  36.4 C  SpO2: 96%     Last Pain:  Vitals:   08/13/21 1352  TempSrc: Oral  PainSc:                  Guarisco Silvas

## 2021-08-13 NOTE — CV Procedure (Signed)
Procedure: Electrical Cardioversion Indications:  Atrial Fibrillation  Procedure Details:  Consent: Risks of procedure as well as the alternatives and risks of each were explained to the (patient/caregiver).  Consent for procedure obtained.  Time Out: Verified patient identification, verified procedure, site/side was marked, verified correct patient position, special equipment/implants available, medications/allergies/relevent history reviewed, required imaging and test results available. PERFORMED.  Patient placed on cardiac monitor, pulse oximetry, supplemental oxygen as necessary.  Sedation given:  propofol per anesthesia Pacer pads placed anterior and posterior chest.  Cardioverted 3 time(s).  Cardioversion with synchronized biphasic 200J shock x 3.   Evaluation: Unsuccessful cardioversion. Findings: Post procedure EKG shows: Atrial Fibrillation with RVR Complications: None Patient did tolerate procedure well.  Time Spent Directly with the Patient:  30 minutes   [name] 08/13/2021, 1:12 PM

## 2021-08-13 NOTE — Progress Notes (Addendum)
Progress Note  Patient Name: Marcus Hicks Date of Encounter: 08/13/2021  [facility] Cardiologist: Dr. Sidonie Dickens  Subjective   No c/o other then hard to sleep here  Inpatient Medications    Scheduled Meds:  apixaban  5 mg Oral BID   atorvastatin  40 mg Oral QHS   dofetilide  500 mcg Oral BID   insulin glargine-yfgn  65 Units Subcutaneous Daily   metFORMIN  500 mg Oral BID WC   metoprolol succinate  100 mg Oral Daily   potassium chloride  40 mEq Oral Once   sacubitril-valsartan  1 tablet Oral BID   sodium chloride flush  3 mL Intravenous Q12H   umeclidinium-vilanterol  1 puff Inhalation q morning   Continuous Infusions:  sodium chloride     magnesium sulfate bolus IVPB     PRN Meds: sodium chloride, albuterol, ALPRAZolam, sodium chloride flush, zolpidem   Vital Signs    Vitals:   08/12/21 1105 08/12/21 1402 08/12/21 1955 08/13/21 0416  BP:  (!) 137/104 120/88 137/82  Pulse: 96 81 96 81  Resp: 20 16 18 18   Temp:  99.4 F (37.4 C) 97.8 F (36.6 C) 97.6 F (36.4 C)  TempSrc:  Oral Oral Oral  SpO2: 99% 98% 97% 97%  Weight:      Height:       No intake or output data in the 24 hours ending 08/13/21 0738  Last 3 Weights 08/11/2021 08/11/2021 07/15/2021  Weight (lbs) 230 lb 2.6 oz 230 lb 3.2 oz 234 lb 9.6 oz  Weight (kg) 104.4 kg 104.418 kg 106.414 kg      Telemetry    Afib 90's-110s, intermittently 130's - Personally Reviewed  ECG    AFib 134bpm, QTc 07/17/2021 - Personally Reviewed  Physical Exam   GEN: No acute distress.   Neck: No JVD Cardiac: irreg-irreg, 1/6 SM, rubs, or gallops.  Respiratory: CTA b/l. GI: Soft, nontender, non-distended  MS: No edema; No deformity. Neuro:  Nonfocal  Psych: Normal affect   Labs    High Sensitivity Troponin:  No results for input(s): TROPONINIHS in the last 720 hours.    Chemistry Recent Labs  Lab 08/11/21 1136 08/12/21 0214 08/12/21 1313 08/13/21 0248  NA 139 139  --  139  K 4.5 3.4* 4.2 3.9  CL  99 99  --  99  CO2 30 31  --  29  GLUCOSE 166* 81  --  103*  BUN 13 13  --  13  CREATININE 1.02 0.87  --  0.90  CALCIUM 9.7 9.2  --  9.5  GFRNONAA >60 >60  --  >60  ANIONGAP 10 9  --  11     HematologyNo results for input(s): WBC, RBC, HGB, HCT, MCV, MCH, MCHC, RDW, PLT in the last 168 hours.  BNPNo results for input(s): BNP, PROBNP in the last 168 hours.   DDimer No results for input(s): DDIMER in the last 168 hours.   Radiology    No results found.  Cardiac Studies   Echo 05/13/21   1. Left ventricular ejection fraction, by estimation, is 45 to 50%. The  left ventricle has mildly decreased function. Left ventricular diastolic  parameters are indeterminate.   2. Right ventricular systolic function is normal. The right ventricular  size is normal. There is normal pulmonary artery systolic pressure.   3. Left atrial size was mildly dilated.   4. Right atrial size was mildly dilated.   5. The mitral valve is normal  in structure. Trivial mitral valve  regurgitation. No evidence of mitral stenosis.   6. Aortic valve regurgitation is not visualized. Moderate aortic valve  stenosis.   7. 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   Patient Profile     55 y.o. male chronic CHF (systolic), bicuspid AV, COPD, HTN, DM, cirrhosis, and AFib admitted for tikosyn initation  Assessment & Plan    Persistent Afib CHA2DS2Vasc is 3, on Eliquis Tikosyn load is in progress K+ 3.9 replacement and recheck ordered Mag 1.6, replacement ordered Creat 0.90 QTc stable  DCCV today Pt aware and agreeable  2. HTN Home meds  3. DM Home regime  4. COPD Home regime  Anticipate home tomorrow  For questions or updates, please contact [facility] Please consult [email or web address] for contact info under        Signed, Sheilah Pigeon, PA-C  08/13/2021, 7:38 AM  I have seen and examined this patient with Francis Dowse.  Agree with above, note added to reflect my findings.  On exam, irregular, tachycardic,  no murmurs. Remains in AF. QTc stable. Plan for DCCV today.    Yusuf Yu M. Shawntelle Ungar MD 08/13/2021 8:29 AM

## 2021-08-13 NOTE — Progress Notes (Signed)
Pharmacy: Dofetilide (Tikosyn) - Follow Up Assessment and Electrolyte Replacement  Pharmacy consulted to assist in monitoring and replacing electrolytes in this 55 y.o. male admitted on 08/11/2021 undergoing dofetilide initiation. First dofetilide dose: 08/12/21.   Labs:    Component Value Date/Time   K 3.9 08/13/2021 0248   MG 1.6 (L) 08/13/2021 0248     Plan: Potassium: K 3.8-3.9:  Give KCl 40 mEq po x1   Magnesium: Mg 1.3-1.7: Give Mg 4 gm IV x1   Thank you for allowing pharmacy to participate in this patient's care   Sheppard Coil PharmD., BCPS Clinical Pharmacist 08/13/2021 6:16 AM

## 2021-08-13 NOTE — H&P (View-Only) (Signed)
Progress Note  Patient Name: Marcus Hicks Date of Encounter: 08/13/2021  [facility] Cardiologist: Dr. Sidonie Dickens  Subjective   No c/o other then hard to sleep here  Inpatient Medications    Scheduled Meds:  apixaban  5 mg Oral BID   atorvastatin  40 mg Oral QHS   dofetilide  500 mcg Oral BID   insulin glargine-yfgn  65 Units Subcutaneous Daily   metFORMIN  500 mg Oral BID WC   metoprolol succinate  100 mg Oral Daily   potassium chloride  40 mEq Oral Once   sacubitril-valsartan  1 tablet Oral BID   sodium chloride flush  3 mL Intravenous Q12H   umeclidinium-vilanterol  1 puff Inhalation q morning   Continuous Infusions:  sodium chloride     magnesium sulfate bolus IVPB     PRN Meds: sodium chloride, albuterol, ALPRAZolam, sodium chloride flush, zolpidem   Vital Signs    Vitals:   08/12/21 1105 08/12/21 1402 08/12/21 1955 08/13/21 0416  BP:  (!) 137/104 120/88 137/82  Pulse: 96 81 96 81  Resp: 20 16 18 18   Temp:  99.4 F (37.4 C) 97.8 F (36.6 C) 97.6 F (36.4 C)  TempSrc:  Oral Oral Oral  SpO2: 99% 98% 97% 97%  Weight:      Height:       No intake or output data in the 24 hours ending 08/13/21 0738  Last 3 Weights 08/11/2021 08/11/2021 07/15/2021  Weight (lbs) 230 lb 2.6 oz 230 lb 3.2 oz 234 lb 9.6 oz  Weight (kg) 104.4 kg 104.418 kg 106.414 kg      Telemetry    Afib 90's-110s, intermittently 130's - Personally Reviewed  ECG    AFib 134bpm, QTc 07/17/2021 - Personally Reviewed  Physical Exam   GEN: No acute distress.   Neck: No JVD Cardiac: irreg-irreg, 1/6 SM, rubs, or gallops.  Respiratory: CTA b/l. GI: Soft, nontender, non-distended  MS: No edema; No deformity. Neuro:  Nonfocal  Psych: Normal affect   Labs    High Sensitivity Troponin:  No results for input(s): TROPONINIHS in the last 720 hours.    Chemistry Recent Labs  Lab 08/11/21 1136 08/12/21 0214 08/12/21 1313 08/13/21 0248  NA 139 139  --  139  K 4.5 3.4* 4.2 3.9  CL  99 99  --  99  CO2 30 31  --  29  GLUCOSE 166* 81  --  103*  BUN 13 13  --  13  CREATININE 1.02 0.87  --  0.90  CALCIUM 9.7 9.2  --  9.5  GFRNONAA >60 >60  --  >60  ANIONGAP 10 9  --  11     HematologyNo results for input(s): WBC, RBC, HGB, HCT, MCV, MCH, MCHC, RDW, PLT in the last 168 hours.  BNPNo results for input(s): BNP, PROBNP in the last 168 hours.   DDimer No results for input(s): DDIMER in the last 168 hours.   Radiology    No results found.  Cardiac Studies   Echo 05/13/21   1. Left ventricular ejection fraction, by estimation, is 45 to 50%. The  left ventricle has mildly decreased function. Left ventricular diastolic  parameters are indeterminate.   2. Right ventricular systolic function is normal. The right ventricular  size is normal. There is normal pulmonary artery systolic pressure.   3. Left atrial size was mildly dilated.   4. Right atrial size was mildly dilated.   5. The mitral valve is normal  in structure. Trivial mitral valve  regurgitation. No evidence of mitral stenosis.   6. Aortic valve regurgitation is not visualized. Moderate aortic valve  stenosis.   7. 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   Patient Profile     55 y.o. male chronic CHF (systolic), bicuspid AV, COPD, HTN, DM, cirrhosis, and AFib admitted for tikosyn initation  Assessment & Plan    Persistent Afib CHA2DS2Vasc is 3, on Eliquis Tikosyn load is in progress K+ 3.9 replacement and recheck ordered Mag 1.6, replacement ordered Creat 0.90 QTc stable  DCCV today Pt aware and agreeable  2. HTN Home meds  3. DM Home regime  4. COPD Home regime  Anticipate home tomorrow  For questions or updates, please contact [facility] Please consult [email or web address] for contact info under        Signed, Sheilah Pigeon, PA-C  08/13/2021, 7:38 AM  I have seen and examined this patient with Francis Dowse.  Agree with above, note added to reflect my findings.  On exam, irregular, tachycardic,  no murmurs. Remains in AF. QTc stable. Plan for DCCV today.    Melisia Leming M. Satia Winger MD 08/13/2021 8:29 AM

## 2021-08-13 NOTE — Progress Notes (Signed)
   08/13/21 1957  Assess: MEWS Score  Temp 97.9 F (36.6 C)  BP (!) 147/84  Pulse Rate (!) 114  ECG Heart Rate (!) 122  Resp 18  Level of Consciousness Alert  SpO2 98 %  O2 Device Room Air  Assess: MEWS Score  MEWS Temp 0  MEWS Systolic 0  MEWS Pulse 2  MEWS RR 0  MEWS LOC 0  MEWS Score 2  MEWS Score Color Yellow  Assess: if the MEWS score is Yellow or Red  Were vital signs taken at a resting state? Yes  Focused Assessment No change from prior assessment  Early Detection of Sepsis Score *See Row Information* High  MEWS guidelines implemented *See Row Information* Yes  Treat  Pain Scale 0-10  Pain Score 2  Pain Type Acute pain;Other (Comment) (DCCV today)  Pain Location Chest  Pain Orientation Anterior  Pain Descriptors / Indicators Tender  Pain Frequency Intermittent  Pain Onset On-going  Patients Stated Pain Goal 0  Pain Intervention(s) Repositioned  Take Vital Signs  Increase Vital Sign Frequency  Yellow: Q 2hr X 2 then Q 4hr X 2, if remains yellow, continue Q 4hrs  Escalate  MEWS: Escalate Yellow: discuss with charge nurse/RN and consider discussing with provider and RRT  Notify: Charge Nurse/RN  Name of Charge Nurse/RN Notified Mariah, RN  Date Charge Nurse/RN Notified 08/13/21  Time Charge Nurse/RN Notified 2000  Document  Patient Outcome Stabilized after interventions  Progress note created (see row info) Yes

## 2021-08-14 DIAGNOSIS — I712 Thoracic aortic aneurysm, without rupture: Secondary | ICD-10-CM

## 2021-08-14 LAB — BASIC METABOLIC PANEL
Anion gap: 6 (ref 5–15)
BUN: 13 mg/dL (ref 6–20)
CO2: 29 mmol/L (ref 22–32)
Calcium: 9.1 mg/dL (ref 8.9–10.3)
Chloride: 103 mmol/L (ref 98–111)
Creatinine, Ser: 0.84 mg/dL (ref 0.61–1.24)
GFR, Estimated: >60 mL/min (ref >60)
Glucose, Bld: 113 mg/dL — ABNORMAL HIGH (ref 70–99)
Potassium: 4 mmol/L (ref 3.5–5.1)
Sodium: 138 mmol/L (ref 135–145)

## 2021-08-14 LAB — GLUCOSE, CAPILLARY
Glucose-Capillary: 83 mg/dL (ref 70–99)
Glucose-Capillary: 142 mg/dL — ABNORMAL HIGH (ref 70–99)
Glucose-Capillary: 114 mg/dL — ABNORMAL HIGH (ref 70–99)

## 2021-08-14 LAB — MAGNESIUM: Magnesium: 1.8 mg/dL (ref 1.7–2.4)

## 2021-08-14 MED ORDER — MAGNESIUM SULFATE 2 GM/50ML IV SOLN
2.0000 g | Freq: Once | INTRAVENOUS | Status: AC
  Administered 2021-08-14: 2 g via INTRAVENOUS
  Filled 2021-08-14: qty 50

## 2021-08-14 MED ORDER — METOPROLOL SUCCINATE ER 100 MG PO TB24: 150.0000 mg | ORAL_TABLET | Freq: Every day | ORAL | 6 refills | Status: DC

## 2021-08-14 MED ORDER — METOPROLOL SUCCINATE ER 50 MG PO TB24
50.0000 mg | ORAL_TABLET | Freq: Once | ORAL | Status: AC
  Administered 2021-08-14: 50 mg via ORAL
  Filled 2021-08-14: qty 1

## 2021-08-14 NOTE — Progress Notes (Addendum)
Post dose EKG from last night is reviewed with Dr. Lalla Brothers, with some QT prolongation from prior and borderline Tlemetry with new, increasing burden of PVCs  Will stop Tikosyn and plan for discharge.  In review of his chart Hx chronic Hep C/cirrhosis and young age, amiodarone not a good option.  He has bicuspid AV, and there has been some discussion he reports with Dr. Dulce Sellar of likely needing AVR in the next couple years His last echo described mod AS mean gradient measures 29.8 mmHg. Aortic valve peak gradient  measures 45.9 mmHg. Aortic valve area, by VTI  measures 0.60 cm TEE to better evaluate? ?eventual AVR, MAZE and LAA clip?  ?PVI ablation He feels poorly in AFib, fatigues easily, gets winded with minimal exertion LVEF 45-50% On Toprol and Entresto  Rates generally look OK though with periods of RVR 130's-140's as well Perhaps up titrate his Toprol further No bradycardia has been observed  Dr. Lalla Brothers will see later this AM, plan to discharge today  Francis Dowse, PA-C

## 2021-08-14 NOTE — Consult Note (Signed)
     [address]       [name] [address]             [phone]        Consult received for Marcus Hicks.  This is a 55 year old gentleman who has had difficult to control persistent atrial fibrillation.  His symptoms began about a year ago shortly after the death of his wife when he had progressive shortness of breath.  He was noted to be in atrial fibrillation with a rapid ventricular response.  Attempts have been made to electrically cardiovert him, as well as medical management but this has been unsuccessful.  He has very little endurance due to his shortness of breath at this point and is unable to do most daily activities without symptoms.  Remains in atrial fibrillation today and tachycardic to the 110s to 130s.  On review of his imaging, he also has moderate aortic stenosis from a bicuspid aortic valve.  His peak gradient is 29 mmHg with a valve area of 1.6 cm.  His left atrium is mildly dilated with a diameter of 3.6 cm.  His right atrium is also mildly dilated.  I had a long discussion with Marcus Hicks in regards to the surgical plan.  I recommended that he undergo an aortic valve replacement, Maze procedure, and atrial clip placement.  He does have a history of cirrhosis thus we will need to check liver function testing and coagulation studies to assess his risk.  Given that he has a bicuspid aortic valve he will also need to undergo a CT aortogram to assess for an ascending aortic aneurysm as well as a left heart cath to assess for coronary disease.  Prior to surgery will also need dental clearance given that he states that he has several cavities that need to be filled.  He is scheduled to be discharged today he will follow-up with me as an outpatient.  We will obtain all of the studies on an outpatient basis as well.  Muhsin Doris Keane Scrape

## 2021-08-14 NOTE — Progress Notes (Signed)
Pharmacy: Dofetilide (Tikosyn) - Follow Up Assessment and Electrolyte Replacement  Pharmacy consulted to assist in monitoring and replacing electrolytes in this 55 y.o. male admitted on 08/11/2021 undergoing dofetilide initiation.   Labs:    Component Value Date/Time   K 4.0 08/14/2021 0330   MG 1.8 08/14/2021 0330     Plan: Potassium: K >/= 4: No additional supplementation needed  Magnesium: Mg 1.8-2: Give Mg 2 gm IV x1 - already ordered    Thank you for allowing pharmacy to participate in this patient's care   Harland German, PharmD Clinical Pharmacist **Pharmacist phone directory can now be found on [email or web address] ([email or web address]).  Listed under [facility].

## 2021-08-14 NOTE — Plan of Care (Signed)
?  Problem: Education: ?Goal: Knowledge of disease or condition will improve ?Outcome: Adequate for Discharge ?Goal: Understanding of medication regimen will improve ?Outcome: Adequate for Discharge ?Goal: Individualized Educational Video(s) ?Outcome: Adequate for Discharge ?  ?Problem: Activity: ?Goal: Ability to tolerate increased activity will improve ?Outcome: Adequate for Discharge ?  ?Problem: Cardiac: ?Goal: Ability to achieve and maintain adequate cardiopulmonary perfusion will improve ?Outcome: Adequate for Discharge ?  ?Problem: Health Behavior/Discharge Planning: ?Goal: Ability to safely manage health-related needs after discharge will improve ?Outcome: Adequate for Discharge ?  ?Problem: Education: ?Goal: Knowledge of General Education information will improve ?Description: Including pain rating scale, medication(s)/side effects and non-pharmacologic comfort measures ?Outcome: Adequate for Discharge ?  ?Problem: Health Behavior/Discharge Planning: ?Goal: Ability to manage health-related needs will improve ?Outcome: Adequate for Discharge ?  ?Problem: Clinical Measurements: ?Goal: Ability to maintain clinical measurements within normal limits will improve ?Outcome: Adequate for Discharge ?Goal: Will remain free from infection ?Outcome: Adequate for Discharge ?Goal: Diagnostic test results will improve ?Outcome: Adequate for Discharge ?Goal: Respiratory complications will improve ?Outcome: Adequate for Discharge ?Goal: Cardiovascular complication will be avoided ?Outcome: Adequate for Discharge ?  ?Problem: Activity: ?Goal: Risk for activity intolerance will decrease ?Outcome: Adequate for Discharge ?  ?Problem: Nutrition: ?Goal: Adequate nutrition will be maintained ?Outcome: Adequate for Discharge ?  ?Problem: Coping: ?Goal: Level of anxiety will decrease ?Outcome: Adequate for Discharge ?  ?Problem: Elimination: ?Goal: Will not experience complications related to bowel motility ?Outcome: Adequate for  Discharge ?Goal: Will not experience complications related to urinary retention ?Outcome: Adequate for Discharge ?  ?Problem: Pain Managment: ?Goal: General experience of comfort will improve ?Outcome: Adequate for Discharge ?  ?Problem: Safety: ?Goal: Ability to remain free from injury will improve ?Outcome: Adequate for Discharge ?  ?Problem: Skin Integrity: ?Goal: Risk for impaired skin integrity will decrease ?Outcome: Adequate for Discharge ?  ?

## 2021-08-14 NOTE — Progress Notes (Signed)
Progress Note  Patient Name: Marcus Hicks Date of Encounter: 08/14/2021  [facility] Cardiologist: Dr. Dulce Hicks  Subjective   Failed DCCV yesterday.   Inpatient Medications    Scheduled Meds:  apixaban  5 mg Oral BID   atorvastatin  40 mg Oral QHS   insulin glargine-yfgn  65 Units Subcutaneous Daily   metFORMIN  500 mg Oral BID WC   metoprolol succinate  100 mg Oral Daily   sacubitril-valsartan  1 tablet Oral BID   sodium chloride flush  3 mL Intravenous Q12H   umeclidinium-vilanterol  1 puff Inhalation q morning   Continuous Infusions:  sodium chloride     PRN Meds: sodium chloride, albuterol, ALPRAZolam, sodium chloride flush, zolpidem   Vital Signs    Vitals:   08/13/21 2210 08/14/21 0022 08/14/21 0416 08/14/21 0915  BP: (!) 136/94 (!) 136/100 121/78   Pulse: 93 84 (!) 138 (!) 133  Resp: 16 16 16 18   Temp: 97.6 F (36.4 C) 98.5 F (36.9 C) 97.8 F (36.6 C)   TempSrc: Oral Oral Oral   SpO2: 98% 97% 97% 98%  Weight:      Height:        Intake/Output Summary (Last 24 hours) at 08/14/2021 1052 Last data filed at 08/13/2021 1308 Gross per 24 hour  Intake 100 ml  Output --  Net 100 ml    Last 3 Weights 08/11/2021 08/11/2021 07/15/2021  Weight (lbs) 230 lb 2.6 oz 230 lb 3.2 oz 234 lb 9.6 oz  Weight (kg) 104.4 kg 104.418 kg 106.414 kg      Telemetry    Afib 90's-110s, intermittently 130's - Personally Reviewed  ECG    Last ECG with QTc slightly above 07/17/2021. Remains in AF.  Physical Exam   GEN: No acute distress.   Neck: No JVD Cardiac: irreg-irreg, III/VI crescendo-decrescendo systsolic murmur. no rubs, or gallops.  Respiratory: CTA b/l. GI: Soft, nontender, non-distended  MS: No edema; No deformity. Neuro:  Nonfocal  Psych: Normal affect   Labs    High Sensitivity Troponin:  No results for input(s): TROPONINIHS in the last 720 hours.    Chemistry Recent Labs  Lab 08/12/21 0214 08/12/21 1313 08/13/21 0248 08/14/21 0330  NA 139   --  139 138  K 3.4* 4.2 3.9 4.0  CL 99  --  99 103  CO2 31  --  29 29  GLUCOSE 81  --  103* 113*  BUN 13  --  13 13  CREATININE 0.87  --  0.90 0.84  CALCIUM 9.2  --  9.5 9.1  GFRNONAA >60  --  >60 >60  ANIONGAP 9  --  11 6      HematologyNo results for input(s): WBC, RBC, HGB, HCT, MCV, MCH, MCHC, RDW, PLT in the last 168 hours.  BNPNo results for input(s): BNP, PROBNP in the last 168 hours.   DDimer No results for input(s): DDIMER in the last 168 hours.   Radiology    No results found.  Cardiac Studies   Echo 05/13/21   1. Left ventricular ejection fraction, by estimation, is 45 to 50%. The  left ventricle has mildly decreased function. Left ventricular diastolic  parameters are indeterminate.   2. Right ventricular systolic function is normal. The right ventricular  size is normal. There is normal pulmonary artery systolic pressure.   3. Left atrial size was mildly dilated.   4. Right atrial size was mildly dilated.   5. The mitral valve is  normal in structure. Trivial mitral valve  regurgitation. No evidence of mitral stenosis.   6. Aortic valve regurgitation is not visualized. Moderate aortic valve  stenosis.   7. 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   Patient Profile     55 y.o. male chronic CHF (systolic), bicuspid AV, COPD, HTN, DM, cirrhosis, and AFib admitted for tikosyn initation. He was loaded on dofetilide but failed dccv.    Persistent Afib 2. HTN 3. DM 4. COPD  Unfortunately Marcus Hicks failed DCCV despite dofetilide. 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 I have reviewed his echocardiogram and CT with Dr Marcus Hicks and Dr Marcus Hicks and we feel that his AV is severely stenosed. It is also leading to LV dysfunction and contributing to his difficult to control AF.  At this point, I favor consulting our cardiothoracic surgery colleagues for bioprosAVR/MAZE/LAAC. I will reach out to them to have them weigh in prior to his discharge.  For  questions or updates, please contact [facility] Please consult [email or web address] for contact info under        Signed, Marcus Prude, MD  08/14/2021, 10:52 AM

## 2021-08-20 ENCOUNTER — Ambulatory Visit (HOSPITAL_COMMUNITY): Payer: 59 | Admitting: Physician Assistant

## 2021-08-25 ENCOUNTER — Ambulatory Visit: Payer: 59 | Admitting: Legal Medicine

## 2021-08-26 ENCOUNTER — Ambulatory Visit (INDEPENDENT_AMBULATORY_CARE_PROVIDER_SITE_OTHER): Payer: 59 | Admitting: Legal Medicine

## 2021-08-26 ENCOUNTER — Encounter: Payer: Self-pay | Admitting: Legal Medicine

## 2021-08-26 ENCOUNTER — Other Ambulatory Visit: Payer: Self-pay

## 2021-08-26 VITALS — BP 130/82 | HR 84 | Temp 97.2°F | Ht 65.0 in | Wt 234.2 lb

## 2021-08-26 DIAGNOSIS — I10 Essential (primary) hypertension: Secondary | ICD-10-CM | POA: Diagnosis not present

## 2021-08-26 DIAGNOSIS — Z23 Encounter for immunization: Secondary | ICD-10-CM

## 2021-08-26 DIAGNOSIS — G4733 Obstructive sleep apnea (adult) (pediatric): Secondary | ICD-10-CM

## 2021-08-26 DIAGNOSIS — J449 Chronic obstructive pulmonary disease, unspecified: Secondary | ICD-10-CM

## 2021-08-26 DIAGNOSIS — E782 Mixed hyperlipidemia: Secondary | ICD-10-CM

## 2021-08-26 DIAGNOSIS — I5020 Unspecified systolic (congestive) heart failure: Secondary | ICD-10-CM | POA: Diagnosis not present

## 2021-08-26 DIAGNOSIS — E1142 Type 2 diabetes mellitus with diabetic polyneuropathy: Secondary | ICD-10-CM | POA: Diagnosis not present

## 2021-08-26 DIAGNOSIS — I4819 Other persistent atrial fibrillation: Secondary | ICD-10-CM

## 2021-08-26 NOTE — Progress Notes (Signed)
Established Patient Office Visit  Subjective:  Patient ID: Marcus Hicks, male    DOB: 02-03-1966  Age: 55 y.o. MRN: [id number]  CC:  Chief Complaint  Patient presents with   Diabetes   Hyperlipidemia   Hypertension    HPI Marcus Hicks presents for chronic visit  Patient has a diagnosis of chronic atrial fibrillation.   Patient is on eliquis and has controlled ventricular response.  Patient is CV stable. He failed cardioversion.  Plan surgery with aortic valve.  Patient present with type 2 diabetes.  Specifically, this is type 2, insulin requiring diabetes, complicated by polyneuropathy.  Compliance with treatment has been good; patient take medicines as directed, maintains diet and exercise regimen, follows up as directed, and is keeping glucose diary.  Date of  diagnosis 2010.  Depression screen has been performed.Tobacco screen nonsmoker. Current medicines for diabetes tresiba 62 units in AM, victoza 1.2 mg , metformin.  Patient is on none for renal protection and atorvasttin for cholesterol control.  Patient performs foot exams daily and last ophthalmologic exam was no eye exam.  Patient presents with hyperlipidemia.  Compliance with treatment has been good; patient takes medicines as directed, maintains low cholesterol diet, follows up as directed, and maintains exercise regimen.  Patient is using atorvastatin without problems. .   Patient presents for follow up of hypertension.  Patient tolerating metoprolol well with side effects.  Patient was diagnosed with hypertension 2010 so has been treated for hypertension for 10 years.Patient is working on maintaining diet and exercise regimen and follows up as directed. Complication include chf.   Patient presents with HFrEF  that is stable. Diagnosis made 2010.  The course of the disease is stable.  Current medicines include entresto, metoprolol. Patient follows a low cholesterol diet and maintains a weight diary.  Patient is  on low salt, low cholesterol diet and avoids alcohol.  Patient denies adverse effects of medicines. Patient is monitoring weight and has gained 5 lbs of weight.  Patient is having np pedal edema, some PND and some PND.  Patient is continuing to see cardiology.   Past Medical History:  Diagnosis Date   Atrial fibrillation with rapid ventricular response ([place]) 08/12/2020   Bicuspid aortic valve    Chronic hepatitis C virus infection ([place]) 08/03/2013   COPD (chronic obstructive pulmonary disease) ([facility])    Diabetes mellitus type 2, insulin dependent ([place])    Essential hypertension 07/03/2015   Last Assessment & Plan:  Blood pressure is borderline high.  Advised to get better sleep, walk on a regular basis, decrease weight down to about 210 and maintain, and eliminate tobacco.   HFrEF (heart failure with reduced ejection fraction) ([place]) 08/22/2020   Long-term insulin use ([place]) 10/12/2016   Obesity, unspecified 10/12/2016   Patient advised to lose down to about 210 pounds and maintained.  Regular walking program recommended.  He knows how to lose weight.   Sleep apnea     Past Surgical History:  Procedure Laterality Date   CARDIOVERSION N/A 09/24/2020   Procedure: CARDIOVERSION;  Surgeon: Donato [name], MD;  Location: [facility] ENDOSCOPY;  Service: Cardiovascular;  Laterality: N/A;   CARDIOVERSION N/A 08/13/2021   Procedure: CARDIOVERSION;  Surgeon: Elouise Munroe, MD;  Location: [facility] ENDOSCOPY;  Service: Cardiovascular;  Laterality: N/A;   CCTA  08/2019   TEE WITHOUT CARDIOVERSION  09/24/2020   Procedure: TRANSESOPHAGEAL ECHOCARDIOGRAM (TEE);  Surgeon: Donato [name], MD;  Location: [facility];  Service: Cardiovascular;;  Family History  Problem Relation Age of Onset   Cancer Maternal Grandfather    Liver disease Sister    Alzheimer's disease Other    Memory loss Other    Hypertension Other     Social History   Socioeconomic History   Marital status: Widowed    Spouse  name: Not on file   Number of children: Not on file   Years of education: Not on file   Highest education level: Not on file  Occupational History   Not on file  Tobacco Use   Smoking status: Former   Smokeless tobacco: Former    Types: Snuff   Tobacco comments:    Former snuff 08/11/2021  Vaping Use   Vaping Use: Never used  Substance and Sexual Activity   Alcohol use: Not Currently   Drug use: Never   Sexual activity: Not on file  Other Topics Concern   Not on file  Social History Narrative   Not on file   Social Determinants of Health   Financial Resource Strain: Not on file  Food Insecurity: Not on file  Transportation Needs: Not on file  Physical Activity: Not on file  Stress: Not on file  Social Connections: Not on file  Intimate Partner Violence: Not on file    Outpatient Medications Prior to Visit  Medication Sig Dispense Refill   albuterol (PROVENTIL) (2.5 MG/3ML) 0.083% nebulizer solution USE 1 VIAL VIA NEBULIZER EVERY 6 HOURS AS NEEDED FOR WHEEZING OR SHORTNESS OF BREATH (Patient taking differently: Take 2.5 mg by nebulization every 6 (six) hours as needed for wheezing.) 150 mL 3   ALPRAZolam (XANAX) 0.5 MG tablet Take 1 tablet (0.5 mg total) by mouth 3 (three) times daily as needed for anxiety. 90 tablet 3   atorvastatin (LIPITOR) 40 MG tablet TAKE 1 TABLET BY MOUTH EVERY DAY (Patient taking differently: Take 40 mg by mouth daily.) 90 tablet 2   BD PEN NEEDLE NANO 2ND GEN 32G X 4 MM MISC USE AS DIRECTED ONCE DAILY 100 each 4   ELIQUIS 5 MG TABS tablet TAKE 1 TABLET(5 MG) BY MOUTH TWICE DAILY (Patient taking differently: Take 5 mg by mouth 2 (two) times daily.) 180 tablet 1   glucose blood (CONTOUR NEXT TEST) test strip 1 each by Other route as needed for other. Use as instructed 100 each 4   metFORMIN (GLUCOPHAGE) 500 MG tablet TAKE 1 TABLET(500 MG) BY MOUTH TWICE DAILY WITH A MEAL (Patient taking differently: Take 500 mg by mouth 2 (two) times daily with a  meal.) 180 tablet 2   metoprolol succinate (TOPROL-XL) 100 MG 24 hr tablet Take 1.5 tablets (150 mg total) by mouth daily. Take with or immediately following a meal. 45 tablet 6   Microlet Lancets MISC USE TO CHECK BLOOD SUGAR EVERY MORNING AND AFTER MEALS     sacubitril-valsartan (ENTRESTO) 49-51 MG Take 1 tablet by mouth 2 (two) times daily. 180 tablet 3   traMADol (ULTRAM) 50 MG tablet Take 50 mg by mouth every 6 (six) hours as needed for severe pain.     TRESIBA FLEXTOUCH 100 UNIT/ML FlexTouch Pen ADMINISTER 62 UNITS UNDER THE SKIN DAILY (Patient taking differently: Inject 62 Units into the skin every morning.) 18 mL 6   umeclidinium-vilanterol (ANORO ELLIPTA) 62.5-25 MCG/INH AEPB Inhale 1 puff into the lungs every morning.     VICTOZA 18 MG/3ML SOPN ADMINISTER 1.2 MG UNDER THE SKIN DAILY (Patient taking differently: Inject 1.2 mg into the skin every morning.) 12  mL 6   zolpidem (AMBIEN) 10 MG tablet Take 1 tablet (10 mg total) by mouth at bedtime as needed for sleep. 30 tablet 3   No facility-administered medications prior to visit.    No Known Allergies  ROS Review of Systems  Constitutional:  Negative for chills, fatigue and fever.  HENT:  Negative for congestion, ear pain and sore throat.   Respiratory:  Positive for shortness of breath. Negative for cough.   Cardiovascular:  Positive for palpitations. Negative for chest pain and leg swelling.  Gastrointestinal:  Negative for abdominal pain, constipation, diarrhea, nausea and vomiting.  Endocrine: Negative for polydipsia, polyphagia and polyuria.  Genitourinary:  Negative for dysuria and frequency.  Musculoskeletal:  Negative for arthralgias and myalgias.  Neurological:  Negative for dizziness and headaches.  Psychiatric/Behavioral:  Negative for dysphoric mood.        No dysphoria     Objective:    Physical Exam Vitals reviewed.  Constitutional:      General: He is not in acute distress.    Appearance: Normal appearance.  He is obese.  HENT:     Head: Normocephalic and atraumatic.     Right Ear: Tympanic membrane, ear canal and external ear normal.     Left Ear: Tympanic membrane, ear canal and external ear normal.     Mouth/Throat:     Mouth: Mucous membranes are moist.     Pharynx: Oropharynx is clear.  Eyes:     Extraocular Movements: Extraocular movements intact.     Conjunctiva/sclera: Conjunctivae normal.     Pupils: Pupils are equal, round, and reactive to light.  Cardiovascular:     Rate and Rhythm: Normal rate. Rhythm irregular.     Pulses: Normal pulses.     Heart sounds: Normal heart sounds. No murmur (AS murmur) heard.   No gallop.  Pulmonary:     Effort: Pulmonary effort is normal. No respiratory distress.     Breath sounds: Normal breath sounds. No wheezing.  Abdominal:     General: Abdomen is flat. Bowel sounds are normal. There is no distension.     Palpations: Abdomen is soft.     Tenderness: There is no abdominal tenderness.  Musculoskeletal:     Cervical back: Normal range of motion.     Right lower leg: No edema.     Left lower leg: No edema.  Skin:    General: Skin is warm.     Capillary Refill: Capillary refill takes less than 2 seconds.  Neurological:     General: No focal deficit present.     Mental Status: He is alert and oriented to person, place, and time. Mental status is at baseline.     Gait: Gait normal.     Deep Tendon Reflexes: Reflexes normal.  Psychiatric:        Mood and Affect: Mood normal.        Thought Content: Thought content normal.    BP 130/82   Pulse 84   Temp (!) 97.2 F (36.2 C)   Ht '5\' 5"'  (1.651 m)   Wt 234 lb 3.2 oz (106.2 kg)   SpO2 96%   BMI 38.97 kg/m  Wt Readings from Last 3 Encounters:  08/26/21 234 lb 3.2 oz (106.2 kg)  08/11/21 230 lb 2.6 oz (104.4 kg)  08/11/21 230 lb 3.2 oz (104.4 kg)     Health Maintenance Due  Topic Date Due   OPHTHALMOLOGY EXAM  Never done   Zoster Vaccines- Shingrix (1  of 2) Never done    COLONOSCOPY (Pts 45-68yr Insurance coverage will need to be confirmed)  Never done   TETANUS/TDAP  06/11/2016    There are no preventive care reminders to display for this patient.  No results found for: TSH Lab Results  Component Value Date   WBC 5.4 06/01/2021   HGB 13.8 06/01/2021   HCT 39.7 06/01/2021   MCV 91.3 06/01/2021   PLT 202 06/01/2021   Lab Results  Component Value Date   NA 138 08/14/2021   K 4.0 08/14/2021   CO2 29 08/14/2021   GLUCOSE 113 (H) 08/14/2021   BUN 13 08/14/2021   CREATININE 0.84 08/14/2021   BILITOT 0.6 06/19/2021   ALKPHOS 63 06/19/2021   AST 33 06/19/2021   ALT 37 06/19/2021   PROT 7.7 06/19/2021   ALBUMIN 4.9 06/19/2021   CALCIUM 9.1 08/14/2021   ANIONGAP 6 08/14/2021   EGFR 66 06/19/2021   Lab Results  Component Value Date   CHOL 190 04/09/2021   Lab Results  Component Value Date   HDL 37 (L) 04/09/2021   Lab Results  Component Value Date   LDLCALC 115 (H) 04/09/2021   Lab Results  Component Value Date   TRIG 215 (H) 04/09/2021   Lab Results  Component Value Date   CHOLHDL 5.1 (H) 04/09/2021   Lab Results  Component Value Date   HGBA1C 7.4 (H) 04/09/2021      Assessment & Plan:       Cardiovascular and Mediastinum   Essential hypertension - Primary   Relevant Orders   CBC with Differential   Comprehensive metabolic panel An individual hypertension care plan was established and reinforced today.  The patient's status was assessed using clinical findings on exam and labs or diagnostic tests. The patient's success at meeting treatment goals on disease specific evidence-based guidelines and found to be well controlled. SELF MANAGEMENT: The patient and I together assessed ways to personally work towards obtaining the recommended goals. RECOMMENDATIONS: avoid decongestants found in common cold remedies, decrease consumption of alcohol, perform routine monitoring of BP with home BP cuff, exercise, reduction of dietary  salt, take medicines as prescribed, try not to miss doses and quit smoking.  Regular exercise and maintaining a healthy weight is needed.  Stress reduction may help. A CLINICAL SUMMARY including written plan identify barriers to care unique to individual due to social or financial issues.  We attempt to mutually creat solutions for individual and family understanding.     HFrEF (heart failure with reduced ejection fraction) ([facility]   Relevant Orders   Pro b natriuretic peptide An individualized care plan was established and reinforced.  The patient's disease status was assessed using clinical finding son exam today, labs, and/or other diagnostic testing such as x-rays, to determine the patient's success in meeting treatmentgoalsbased on disease-based guidelines and found to beimproving. But not at goal yet. Medications prescriptions no changes Laboratory tests ordered to be performed today include bnp. RECOMMENDATIONS: given include see cardiology.  Call physician is patient gains 3 lbs in one day or 5 lbs for one week.  Call for progressive PND, orthopnea or increased pedal edema.     Persistent atrial fibrillation ([facility] Patient has a diagnosis of chronic atrial fibrillation.   Patient is on eliquis and has controlled ventricular response.  Patient is CV stble.      Respiratory   COPD (chronic obstructive pulmonary disease) ([place] An individualize plan was formulated for care of COPD.  Treatment is  evidence based.  She will continue on inhalers, avoid smoking and smoke.  Regular exercise with help with dyspnea. Routine follow ups and medication compliance is needed.     Sleep apnea Using CPAP consistently every night and medically benefiting from its use.      Endocrine   Type 2 diabetes mellitus with diabetic polyneuropathy ([place])   Relevant Orders   Hemoglobin A1c An individual care plan for diabetes was established and reinforced today.  The patient's status was assessed using clinical  findings on exam, labs and diagnostic testing. Patient success at meeting goals based on disease specific evidence-based guidelines and found to be good controlled. Medications were assessed and patient's understanding of the medical issues , including barriers were assessed. Recommend adherence to a diabetic diet, a graduated exercise program, HgbA1c level is checked quarterly, and urine microalbumin performed yearly .  Annual mono-filament sensation testing performed. Lower blood pressure and control hyperlipidemia is important. Get annual eye exams and annual flu shots and smoking cessation discussed.  Self management goals were discussed.      Other   Hyperlipidemia   Relevant Orders   Lipid panel AN INDIVIDUAL CARE PLAN for hyperlipidemia/ cholesterol was established and reinforced today.  The patient's status was assessed using clinical findings on exam, lab and other diagnostic tests. The patient's disease status was assessed based on evidence-based guidelines and found to be fair controlled. MEDICATIONS were reviewed. SELF MANAGEMENT GOALS have been discussed and patient's success at attaining the goal of low cholesterol was assessed. RECOMMENDATION given include regular exercise 3 days a week and low cholesterol/low fat diet. CLINICAL SUMMARY including written plan to identify barriers unique to the patient due to social or economic  reasons was discussed.     Morbid obesity ([place]) An individualize plan was formulated for obesity using patient history and physical exam to encourage weight loss.  An evidence based program was formulated.  Patient is to cut portion size with meals and to plan physical exercise 3 days a week at least 20 minutes.  Weight watchers and other programs are helpful.  Planned amount of weight loss 10 lbs.    Other Visit Diagnoses     Need for vaccination       Relevant Orders   Flu Vaccine MDCK QUAD PF (Completed)      A total of 30 minutes were spent  face-to-face with the patient during this encounter and over half of that time was spent on counseling and coordination of care.     Follow-up: Return in about 4 months (around 12/26/2021) for fasting.    Reinaldo Meeker, MD

## 2021-08-27 ENCOUNTER — Other Ambulatory Visit: Payer: Self-pay | Admitting: Legal Medicine

## 2021-08-27 DIAGNOSIS — E785 Hyperlipidemia, unspecified: Secondary | ICD-10-CM

## 2021-08-27 LAB — LIPID PANEL
Chol/HDL Ratio: 4.4 ratio (ref 0.0–5.0)
Cholesterol, Total: 184 mg/dL (ref 100–199)
HDL: 42 mg/dL (ref >39)
LDL Chol Calc (NIH): 109 mg/dL — ABNORMAL HIGH (ref 0–99)
Triglycerides: 186 mg/dL — ABNORMAL HIGH (ref 0–149)
VLDL Cholesterol Cal: 33 mg/dL (ref 5–40)

## 2021-08-27 LAB — COMPREHENSIVE METABOLIC PANEL
ALT: 33 IU/L (ref 0–44)
AST: 25 IU/L (ref 0–40)
Albumin/Globulin Ratio: 1.8 (ref 1.2–2.2)
Albumin: 4.7 g/dL (ref 3.8–4.9)
Alkaline Phosphatase: 59 IU/L (ref 44–121)
BUN/Creatinine Ratio: 19 (ref 9–20)
BUN: 18 mg/dL (ref 6–24)
Bilirubin Total: 0.4 mg/dL (ref 0.0–1.2)
CO2: 27 mmol/L (ref 20–29)
Calcium: 9.7 mg/dL (ref 8.7–10.2)
Chloride: 98 mmol/L (ref 96–106)
Creatinine, Ser: 0.96 mg/dL (ref 0.76–1.27)
Globulin, Total: 2.6 g/dL (ref 1.5–4.5)
Glucose: 165 mg/dL — ABNORMAL HIGH (ref 70–99)
Potassium: 4.7 mmol/L (ref 3.5–5.2)
Sodium: 138 mmol/L (ref 134–144)
Total Protein: 7.3 g/dL (ref 6.0–8.5)
eGFR: 93 mL/min/{1.73_m2} (ref >59)

## 2021-08-27 LAB — CBC WITH DIFFERENTIAL/PLATELET
Basophils Absolute: 0.1 10*3/uL (ref 0.0–0.2)
Basos: 1 %
EOS (ABSOLUTE): 0.3 10*3/uL (ref 0.0–0.4)
Eos: 4 %
Hematocrit: 51.6 % — ABNORMAL HIGH (ref 37.5–51.0)
Hemoglobin: 16.9 g/dL (ref 13.0–17.7)
Immature Grans (Abs): 0 10*3/uL (ref 0.0–0.1)
Immature Granulocytes: 0 %
Lymphocytes Absolute: 1.5 10*3/uL (ref 0.7–3.1)
Lymphs: 21 %
MCH: 29.7 pg (ref 26.6–33.0)
MCHC: 32.8 g/dL (ref 31.5–35.7)
MCV: 91 fL (ref 79–97)
Monocytes Absolute: 0.5 10*3/uL (ref 0.1–0.9)
Monocytes: 7 %
Neutrophils Absolute: 4.8 10*3/uL (ref 1.4–7.0)
Neutrophils: 67 %
Platelets: 182 10*3/uL (ref 150–450)
RBC: 5.69 x10E6/uL (ref 4.14–5.80)
RDW: 12.2 % (ref 11.6–15.4)
WBC: 7.3 10*3/uL (ref 3.4–10.8)

## 2021-08-27 LAB — HEMOGLOBIN A1C
Est. average glucose Bld gHb Est-mCnc: 160 mg/dL
Hgb A1c MFr Bld: 7.2 % — ABNORMAL HIGH (ref 4.8–5.6)

## 2021-08-27 LAB — CARDIOVASCULAR RISK ASSESSMENT

## 2021-08-27 LAB — PRO B NATRIURETIC PEPTIDE: NT-Pro BNP: 1398 pg/mL — ABNORMAL HIGH (ref 0–210)

## 2021-08-27 MED ORDER — ROSUVASTATIN CALCIUM 40 MG PO TABS: 40.0000 mg | ORAL_TABLET | Freq: Every day | ORAL | 3 refills | Status: DC

## 2021-08-27 NOTE — Progress Notes (Signed)
Pro bnp rising 1,398, last 6 months ago ws 407.  He is getting more DOE see cardiology lp

## 2021-08-27 NOTE — Progress Notes (Signed)
Cbc normal, glucose 165, kidney and liver tests normal, triglycerides high 186, LDL cholesterol high 109- need to lower bad cholesterol, I recommend trying crestor rather than atorvastatin, A!c  7.2 ok,  lp

## 2021-08-28 ENCOUNTER — Telehealth: Payer: Self-pay | Admitting: Cardiology

## 2021-08-28 NOTE — Telephone Encounter (Signed)
Spoke to the patient just now and let him know Dr. Munley's recommendations. He verbalizes understanding.  

## 2021-08-28 NOTE — Telephone Encounter (Signed)
Patient is requesting to have Dr. Dulce Sellar take a look at his recent labs with [facility].

## 2021-08-30 ENCOUNTER — Other Ambulatory Visit: Payer: Self-pay | Admitting: Legal Medicine

## 2021-08-30 DIAGNOSIS — E1142 Type 2 diabetes mellitus with diabetic polyneuropathy: Secondary | ICD-10-CM

## 2021-09-01 ENCOUNTER — Other Ambulatory Visit: Payer: Self-pay | Admitting: Legal Medicine

## 2021-09-01 DIAGNOSIS — F5102 Adjustment insomnia: Secondary | ICD-10-CM

## 2021-09-04 ENCOUNTER — Other Ambulatory Visit: Payer: Self-pay | Admitting: Cardiology

## 2021-09-04 NOTE — Telephone Encounter (Signed)
Furosemide 20 mg refill request denied , wad d/c 01/23/2021

## 2021-09-14 NOTE — Progress Notes (Signed)
Electrophysiology Office Follow up Visit Note:    Date:  09/15/2021   ID:  Marcus Hicks, DOB 07/14/1966, MRN [id number]  PCP:  Perry, Lawrence Edward, MD  [facility] Cardiologist:  None  [facility] Electrophysiologist:  Melvina Pangelinan T Dymir Neeson, MD    Interval History:    Marcus Hicks is a 55 y.o. male who presents for a follow up visit.  I saw the patient while he was hospitalized for dofetilide loading in September.  He failed cardioversion after completing dofetilide loading so we abandoned Tikosyn altogether.  There is also QTC prolongation.  CTS was consulted for possible AVR/maze/left atrial appendage clipping.  Today the patient tells me he has been doing okay but continues to have shortness of breath with minimal exertion.  He is still waiting to hear from the cardiothoracic surgical office about scheduling his surgery.     Past Medical History:  Diagnosis Date   Atrial fibrillation with rapid ventricular response (HCC) 08/12/2020   Bicuspid aortic valve    Chronic hepatitis C virus infection (HCC) 08/03/2013   COPD (chronic obstructive pulmonary disease) (HCC)    Diabetes mellitus type 2, insulin dependent (HCC)    Essential hypertension 07/03/2015   Last Assessment & Plan:  Blood pressure is borderline high.  Advised to get better sleep, walk on a regular basis, decrease weight down to about 210 and maintain, and eliminate tobacco.   HFrEF (heart failure with reduced ejection fraction) (HCC) 08/22/2020   Long-term insulin use (HCC) 10/12/2016   Obesity, unspecified 10/12/2016   Patient advised to lose down to about 210 pounds and maintained.  Regular walking program recommended.  He knows how to lose weight.   Sleep apnea     Past Surgical History:  Procedure Laterality Date   CARDIOVERSION N/A 09/24/2020   Procedure: CARDIOVERSION;  Surgeon: Schumann, Christopher L, MD;  Location: [facility];  Service: Cardiovascular;  Laterality: N/A;   CARDIOVERSION N/A  08/13/2021   Procedure: CARDIOVERSION;  Surgeon: Acharya, Gayatri A, MD;  Location: [facility]OS[facility];  Service: Cardiovascular;  Laterality: N/A;   CCTA  08/2019   TEE WITHOUT CARDIOVERSION  09/24/2020   Procedure: TRANSESOPHAGEAL ECHOCARDIOGRAM (TEE);  Surgeon: Schumann, Christopher L, MD;  Location: [facility];  Service: Cardiovascular;;    Current Medications: Current Meds  Medication Sig   albuterol (PROVENTIL) (2.5 MG/3ML) 0.083% nebulizer solution USE 1 VIAL VIA NEBULIZER EVERY 6 HOURS AS NEEDED FOR WHEEZING OR SHORTNESS OF BREATH (Patient taking differently: Take 2.5 mg by nebulization every 6 (six) hours as needed for wheezing.)   ALPRAZolam (XANAX) 0.5 MG tablet Take 1 tablet (0.5 mg total) by mouth 3 (three) times daily as needed for anxiety.   BD PEN NEEDLE NANO 2ND GEN 32G X 4 MM MISC USE AS DIRECTED ONCE DAILY   ELIQUIS 5 MG TABS tablet TAKE 1 TABLET(5 MG) BY MOUTH TWICE DAILY (Patient taking differently: Take 5 mg by mouth 2 (two) times daily.)   glucose blood (CONTOUR NEXT TEST) test strip 1 each by Other route as needed for other. Use as instructed   metFORMIN (GLUCOPHAGE) 500 MG tablet TAKE 1 TABLET(500 MG) BY MOUTH TWICE DAILY WITH A MEAL (Patient taking differently: Take 500 mg by mouth 2 (two) times daily with a meal.)   Microlet Lancets MISC USE TO CHECK BLOOD SUGAR EVERY MORNING AND AFTER MEALS   rosuvastatin (CRESTOR) 40 MG tablet Take 1 tablet (40 mg total) by mouth daily.   sacubitril-valsartan (ENTRESTO) 49-51 MG Take 1 tablet by   mouth 2 (two) times daily.   traMADol (ULTRAM) 50 MG tablet Take 50 mg by mouth every 6 (six) hours as needed for severe pain.   TRESIBA FLEXTOUCH 100 UNIT/ML FlexTouch Pen ADMINISTER 62 UNITS UNDER THE SKIN DAILY (Patient taking differently: Inject 62 Units into the skin every morning.)   umeclidinium-vilanterol (ANORO ELLIPTA) 62.5-25 MCG/INH AEPB Inhale 1 puff into the lungs every morning.   VICTOZA 18 MG/3ML SOPN ADMINISTER 1.2 MG UNDER THE  SKIN DAILY (Patient taking differently: Inject 1.2 mg into the skin every morning.)   zolpidem (AMBIEN) 10 MG tablet TAKE 1 TABLET(10 MG) BY MOUTH AT BEDTIME AS NEEDED FOR SLEEP   [DISCONTINUED] metoprolol succinate (TOPROL-XL) 100 MG 24 hr tablet Take 1.5 tablets (150 mg total) by mouth daily. Take with or immediately following a meal. (Patient taking differently: Take 100 mg by mouth daily. Take with or immediately following a meal.)     Allergies:   Patient has no known allergies.   Social History   Socioeconomic History   Marital status: Widowed    Spouse name: Not on file   Number of children: Not on file   Years of education: Not on file   Highest education level: Not on file  Occupational History   Not on file  Tobacco Use   Smoking status: Former   Smokeless tobacco: Former    Types: Snuff   Tobacco comments:    Former snuff 08/11/2021  Vaping Use   Vaping Use: Never used  Substance and Sexual Activity   Alcohol use: Not Currently   Drug use: Never   Sexual activity: Not on file  Other Topics Concern   Not on file  Social History Narrative   Not on file   Social Determinants of Health   Financial Resource Strain: Not on file  Food Insecurity: Not on file  Transportation Needs: Not on file  Physical Activity: Not on file  Stress: Not on file  Social Connections: Not on file     Family History: The patient's family history includes Alzheimer's disease in an other family member; Cancer in his maternal grandfather; Hypertension in an other family member; Liver disease in his sister; Memory loss in an other family member.  ROS:   Please see the history of present illness.    All other systems reviewed and are negative.  EKGs/Labs/Other Studies Reviewed:    The following studies were reviewed today:  EKG:  The ekg ordered today demonstrates atrial fibrillation with a ventricular rate of 97 bpm.  Recent Labs: 06/01/2021: B Natriuretic Peptide 85.2 08/14/2021:  Magnesium 1.8 08/26/2021: ALT 33; BUN 18; Creatinine, Ser 0.96; Hemoglobin 16.9; NT-Pro BNP 1,398; Platelets 182; Potassium 4.7; Sodium 138  Recent Lipid Panel    Component Value Date/Time   CHOL 184 08/26/2021 1534   TRIG 186 (H) 08/26/2021 1534   HDL 42 08/26/2021 1534   CHOLHDL 4.4 08/26/2021 1534   LDLCALC 109 (H) 08/26/2021 1534    Physical Exam:    VS:  BP 130/84   Pulse 97   Ht 5' 4" (1.626 m)   Wt 234 lb (106.1 kg)   BMI 40.17 kg/m     Wt Readings from Last 3 Encounters:  09/15/21 234 lb (106.1 kg)  08/26/21 234 lb 3.2 oz (106.2 kg)  08/11/21 230 lb 2.6 oz (104.4 kg)     GEN: Well nourished, well developed in no acute distress HEENT: Normal NECK: No JVD; No carotid bruits LYMPHATICS: No lymphadenopathy CARDIAC: Irregularly irregular,   3 out of 6 crescendo decrescendo systolic ejection murmur loudest at the upper sternal borders.  No rubs or gallops. RESPIRATORY:  Clear to auscultation without rales, wheezing or rhonchi  ABDOMEN: Soft, non-tender, non-distended MUSCULOSKELETAL:  No edema; No deformity  SKIN: Warm and dry NEUROLOGIC:  Alert and oriented x 3 PSYCHIATRIC:  Normal affect        ASSESSMENT:    1. Persistent atrial fibrillation (HCC)   2. Bicuspid aortic valve   3. Aortic valve stenosis, etiology of cardiac valve disease unspecified    PLAN:    In order of problems listed above:   #Persistent atrial fibrillation Still out of rhythm.  On Eliquis for stroke prophylaxis.  Patient will need a maze and left atrial appendage ligation during his aortic valve replacement.  #Bicuspid aortic valve and severe aortic stenosis Has been seen by Dr. Cliffton Asters while inpatient.  We will reach out to the cardiothoracic surgical office for an update regarding scheduling the procedure.  Patient will require aortic valve replacement/left atrial appendage ligation and maze.     Medication Adjustments/Labs and Tests Ordered: Current medicines are reviewed at  length with the patient today.  Concerns regarding medicines are outlined above.  Orders Placed This Encounter  Procedures   EKG 12-Lead   Meds ordered this encounter  Medications   metoprolol succinate (TOPROL-XL) 100 MG 24 hr tablet    Sig: Take 1 tablet (100 mg total) by mouth daily. Take with or immediately following a meal.    Dispense:  90 tablet    Refill:  3     Signed, Steffanie Dunn, MD, [facility], [facility] 09/15/2021 8:22 PM    Electrophysiology [facility]

## 2021-09-14 NOTE — H&P (View-Only) (Signed)
Electrophysiology Office Follow up Visit Note:    Date:  09/15/2021   ID:  Marcus Hicks, DOB 1966/07/12, MRN [id number]  PCP:  Abigail Miyamoto, MD  [facility] Cardiologist:  None  [facility] Electrophysiologist:  Lanier Prude, MD    Interval History:    Marcus Hicks is a 55 y.o. male who presents for a follow up visit.  I saw the patient while he was hospitalized for dofetilide loading in September.  He failed cardioversion after completing dofetilide loading so we abandoned Tikosyn altogether.  There is also QTC prolongation.  CTS was consulted for possible AVR/maze/left atrial appendage clipping.  Today the patient tells me he has been doing okay but continues to have shortness of breath with minimal exertion.  He is still waiting to hear from the cardiothoracic surgical office about scheduling his surgery.     Past Medical History:  Diagnosis Date   Atrial fibrillation with rapid ventricular response (HCC) 08/12/2020   Bicuspid aortic valve    Chronic hepatitis C virus infection (HCC) 08/03/2013   COPD (chronic obstructive pulmonary disease) (HCC)    Diabetes mellitus type 2, insulin dependent (HCC)    Essential hypertension 07/03/2015   Last Assessment & Plan:  Blood pressure is borderline high.  Advised to get better sleep, walk on a regular basis, decrease weight down to about 210 and maintain, and eliminate tobacco.   HFrEF (heart failure with reduced ejection fraction) (HCC) 08/22/2020   Long-term insulin use (HCC) 10/12/2016   Obesity, unspecified 10/12/2016   Patient advised to lose down to about 210 pounds and maintained.  Regular walking program recommended.  He knows how to lose weight.   Sleep apnea     Past Surgical History:  Procedure Laterality Date   CARDIOVERSION N/A 09/24/2020   Procedure: CARDIOVERSION;  Surgeon: [name], MD;  Location: [facility] ENDOSCOPY;  Service: Cardiovascular;  Laterality: N/A;   CARDIOVERSION N/A  08/13/2021   Procedure: CARDIOVERSION;  Surgeon: [name], MD;  Location: [facility] ENDOSCOPY;  Service: Cardiovascular;  Laterality: N/A;   CCTA  08/2019   TEE WITHOUT CARDIOVERSION  09/24/2020   Procedure: TRANSESOPHAGEAL ECHOCARDIOGRAM (TEE);  Surgeon: [name], MD;  Location: [facility] ENDOSCOPY;  Service: Cardiovascular;;    Current Medications: Current Meds  Medication Sig   albuterol (PROVENTIL) (2.5 MG/3ML) 0.083% nebulizer solution USE 1 VIAL VIA NEBULIZER EVERY 6 HOURS AS NEEDED FOR WHEEZING OR SHORTNESS OF BREATH (Patient taking differently: Take 2.5 mg by nebulization every 6 (six) hours as needed for wheezing.)   ALPRAZolam (XANAX) 0.5 MG tablet Take 1 tablet (0.5 mg total) by mouth 3 (three) times daily as needed for anxiety.   BD PEN NEEDLE NANO 2ND GEN 32G X 4 MM MISC USE AS DIRECTED ONCE DAILY   ELIQUIS 5 MG TABS tablet TAKE 1 TABLET(5 MG) BY MOUTH TWICE DAILY (Patient taking differently: Take 5 mg by mouth 2 (two) times daily.)   glucose blood (CONTOUR NEXT TEST) test strip 1 each by Other route as needed for other. Use as instructed   metFORMIN (GLUCOPHAGE) 500 MG tablet TAKE 1 TABLET(500 MG) BY MOUTH TWICE DAILY WITH A MEAL (Patient taking differently: Take 500 mg by mouth 2 (two) times daily with a meal.)   Microlet Lancets MISC USE TO CHECK BLOOD SUGAR EVERY MORNING AND AFTER MEALS   rosuvastatin (CRESTOR) 40 MG tablet Take 1 tablet (40 mg total) by mouth daily.   sacubitril-valsartan (ENTRESTO) 49-51 MG Take 1 tablet by  mouth 2 (two) times daily.   traMADol (ULTRAM) 50 MG tablet Take 50 mg by mouth every 6 (six) hours as needed for severe pain.   TRESIBA FLEXTOUCH 100 UNIT/ML FlexTouch Pen ADMINISTER 62 UNITS UNDER THE SKIN DAILY (Patient taking differently: Inject 62 Units into the skin every morning.)   umeclidinium-vilanterol (ANORO ELLIPTA) 62.5-25 MCG/INH AEPB Inhale 1 puff into the lungs every morning.   VICTOZA 18 MG/3ML SOPN ADMINISTER 1.2 MG UNDER THE  SKIN DAILY (Patient taking differently: Inject 1.2 mg into the skin every morning.)   zolpidem (AMBIEN) 10 MG tablet TAKE 1 TABLET(10 MG) BY MOUTH AT BEDTIME AS NEEDED FOR SLEEP   [DISCONTINUED] metoprolol succinate (TOPROL-XL) 100 MG 24 hr tablet Take 1.5 tablets (150 mg total) by mouth daily. Take with or immediately following a meal. (Patient taking differently: Take 100 mg by mouth daily. Take with or immediately following a meal.)     Allergies:   Patient has no known allergies.   Social History   Socioeconomic History   Marital status: Widowed    Spouse name: Not on file   Number of children: Not on file   Years of education: Not on file   Highest education level: Not on file  Occupational History   Not on file  Tobacco Use   Smoking status: Former   Smokeless tobacco: Former    Types: Snuff   Tobacco comments:    Former snuff 08/11/2021  Vaping Use   Vaping Use: Never used  Substance and Sexual Activity   Alcohol use: Not Currently   Drug use: Never   Sexual activity: Not on file  Other Topics Concern   Not on file  Social History Narrative   Not on file   Social Determinants of Health   Financial Resource Strain: Not on file  Food Insecurity: Not on file  Transportation Needs: Not on file  Physical Activity: Not on file  Stress: Not on file  Social Connections: Not on file     Family History: The patient's family history includes Alzheimer's disease in an other family member; Cancer in his maternal grandfather; Hypertension in an other family member; Liver disease in his sister; Memory loss in an other family member.  ROS:   Please see the history of present illness.    All other systems reviewed and are negative.  EKGs/Labs/Other Studies Reviewed:    The following studies were reviewed today:  EKG:  The ekg ordered today demonstrates atrial fibrillation with a ventricular rate of 97 bpm.  Recent Labs: 06/01/2021: B Natriuretic Peptide 85.2 08/14/2021:  Magnesium 1.8 08/26/2021: ALT 33; BUN 18; Creatinine, Ser 0.96; Hemoglobin 16.9; NT-Pro BNP 1,398; Platelets 182; Potassium 4.7; Sodium 138  Recent Lipid Panel    Component Value Date/Time   CHOL 184 08/26/2021 1534   TRIG 186 (H) 08/26/2021 1534   HDL 42 08/26/2021 1534   CHOLHDL 4.4 08/26/2021 1534   LDLCALC 109 (H) 08/26/2021 1534    Physical Exam:    VS:  BP 130/84   Pulse 97   Ht 5\' 4"  (1.626 m)   Wt 234 lb (106.1 kg)   BMI 40.17 kg/m     Wt Readings from Last 3 Encounters:  09/15/21 234 lb (106.1 kg)  08/26/21 234 lb 3.2 oz (106.2 kg)  08/11/21 230 lb 2.6 oz (104.4 kg)     GEN: Well nourished, well developed in no acute distress HEENT: Normal NECK: No JVD; No carotid bruits LYMPHATICS: No lymphadenopathy CARDIAC: Irregularly irregular,  3 out of 6 crescendo decrescendo systolic ejection murmur loudest at the upper sternal borders.  No rubs or gallops. RESPIRATORY:  Clear to auscultation without rales, wheezing or rhonchi  ABDOMEN: Soft, non-tender, non-distended MUSCULOSKELETAL:  No edema; No deformity  SKIN: Warm and dry NEUROLOGIC:  Alert and oriented x 3 PSYCHIATRIC:  Normal affect        ASSESSMENT:    1. Persistent atrial fibrillation (HCC)   2. Bicuspid aortic valve   3. Aortic valve stenosis, etiology of cardiac valve disease unspecified    PLAN:    In order of problems listed above:   #Persistent atrial fibrillation Still out of rhythm.  On Eliquis for stroke prophylaxis.  Patient will need a maze and left atrial appendage ligation during his aortic valve replacement.  #Bicuspid aortic valve and severe aortic stenosis Has been seen by Dr. Cliffton Asters while inpatient.  We will reach out to the cardiothoracic surgical office for an update regarding scheduling the procedure.  Patient will require aortic valve replacement/left atrial appendage ligation and maze.     Medication Adjustments/Labs and Tests Ordered: Current medicines are reviewed at  length with the patient today.  Concerns regarding medicines are outlined above.  Orders Placed This Encounter  Procedures   EKG 12-Lead   Meds ordered this encounter  Medications   metoprolol succinate (TOPROL-XL) 100 MG 24 hr tablet    Sig: Take 1 tablet (100 mg total) by mouth daily. Take with or immediately following a meal.    Dispense:  90 tablet    Refill:  3     Signed, Steffanie Dunn, MD, [facility], [facility] 09/15/2021 8:22 PM    Electrophysiology [facility]

## 2021-09-15 ENCOUNTER — Other Ambulatory Visit: Payer: Self-pay

## 2021-09-15 ENCOUNTER — Ambulatory Visit (INDEPENDENT_AMBULATORY_CARE_PROVIDER_SITE_OTHER): Payer: 59 | Admitting: Cardiology

## 2021-09-15 ENCOUNTER — Encounter: Payer: Self-pay | Admitting: Cardiology

## 2021-09-15 VITALS — BP 130/84 | HR 97 | Ht 64.0 in | Wt 234.0 lb

## 2021-09-15 DIAGNOSIS — I4819 Other persistent atrial fibrillation: Secondary | ICD-10-CM

## 2021-09-15 DIAGNOSIS — Q231 Congenital insufficiency of aortic valve: Secondary | ICD-10-CM

## 2021-09-15 DIAGNOSIS — I350 Nonrheumatic aortic (valve) stenosis: Secondary | ICD-10-CM

## 2021-09-15 MED ORDER — METOPROLOL SUCCINATE ER 100 MG PO TB24: 100.0000 mg | ORAL_TABLET | Freq: Every day | ORAL | 3 refills | Status: DC

## 2021-09-15 NOTE — Patient Instructions (Addendum)

## 2021-09-16 ENCOUNTER — Other Ambulatory Visit: Payer: Self-pay | Admitting: Thoracic Surgery (Cardiothoracic Vascular Surgery)

## 2021-09-16 DIAGNOSIS — I712 Thoracic aortic aneurysm, without rupture, unspecified: Secondary | ICD-10-CM

## 2021-09-18 ENCOUNTER — Other Ambulatory Visit: Payer: Self-pay | Admitting: Legal Medicine

## 2021-09-18 DIAGNOSIS — E1142 Type 2 diabetes mellitus with diabetic polyneuropathy: Secondary | ICD-10-CM

## 2021-09-22 ENCOUNTER — Encounter (HOSPITAL_COMMUNITY): Payer: Self-pay | Admitting: Dentistry

## 2021-09-22 ENCOUNTER — Other Ambulatory Visit: Payer: Self-pay

## 2021-09-22 ENCOUNTER — Ambulatory Visit (INDEPENDENT_AMBULATORY_CARE_PROVIDER_SITE_OTHER): Payer: 59 | Admitting: Dentistry

## 2021-09-22 VITALS — BP 156/79 | HR 85 | Temp 98.4°F

## 2021-09-22 DIAGNOSIS — K030 Excessive attrition of teeth: Secondary | ICD-10-CM | POA: Insufficient documentation

## 2021-09-22 DIAGNOSIS — Z01818 Encounter for other preprocedural examination: Secondary | ICD-10-CM

## 2021-09-22 DIAGNOSIS — K117 Disturbances of salivary secretion: Secondary | ICD-10-CM

## 2021-09-22 DIAGNOSIS — K08109 Complete loss of teeth, unspecified cause, unspecified class: Secondary | ICD-10-CM

## 2021-09-22 DIAGNOSIS — Q231 Congenital insufficiency of aortic valve: Secondary | ICD-10-CM | POA: Diagnosis not present

## 2021-09-22 DIAGNOSIS — K0530 Chronic periodontitis, unspecified: Secondary | ICD-10-CM | POA: Insufficient documentation

## 2021-09-22 DIAGNOSIS — Z7901 Long term (current) use of anticoagulants: Secondary | ICD-10-CM

## 2021-09-22 DIAGNOSIS — J392 Other diseases of pharynx: Secondary | ICD-10-CM | POA: Insufficient documentation

## 2021-09-22 DIAGNOSIS — F40232 Fear of other medical care: Secondary | ICD-10-CM

## 2021-09-22 DIAGNOSIS — I4819 Other persistent atrial fibrillation: Secondary | ICD-10-CM | POA: Diagnosis not present

## 2021-09-22 DIAGNOSIS — F458 Other somatoform disorders: Secondary | ICD-10-CM

## 2021-09-22 DIAGNOSIS — K029 Dental caries, unspecified: Secondary | ICD-10-CM

## 2021-09-22 DIAGNOSIS — M2632 Excessive spacing of fully erupted teeth: Secondary | ICD-10-CM

## 2021-09-22 DIAGNOSIS — K011 Impacted teeth: Secondary | ICD-10-CM

## 2021-09-22 DIAGNOSIS — K036 Deposits [accretions] on teeth: Secondary | ICD-10-CM

## 2021-09-22 HISTORY — DX: Complete loss of teeth, unspecified cause, unspecified class: K08.109

## 2021-09-22 HISTORY — DX: Fear of other medical care: F40.232

## 2021-09-22 HISTORY — DX: Long term (current) use of anticoagulants: Z79.01

## 2021-09-22 HISTORY — DX: Other diseases of pharynx: J39.2

## 2021-09-22 HISTORY — DX: Encounter for other preprocedural examination: Z01.818

## 2021-09-22 HISTORY — DX: Deposits (accretions) on teeth: K03.6

## 2021-09-22 HISTORY — DX: Excessive attrition of teeth: K03.0

## 2021-09-22 HISTORY — DX: Disturbances of salivary secretion: K11.7

## 2021-09-22 HISTORY — DX: Other somatoform disorders: F45.8

## 2021-09-22 HISTORY — DX: Impacted teeth: K01.1

## 2021-09-22 HISTORY — DX: Chronic periodontitis, unspecified: K05.30

## 2021-09-22 HISTORY — DX: Excessive spacing of fully erupted teeth: M26.32

## 2021-09-22 HISTORY — DX: Dental caries, unspecified: K02.9

## 2021-09-22 NOTE — Progress Notes (Signed)
Department of Dental Medicine             OUTPATIENT CONSULT   Service Date:   09/22/2021  Patient Name:   Marcus Hicks Date of Birth:   05/21/1966 Medical Record Number: [id number]  Referring Provider:                Brynda Greathouse, M.D.   PLAN/RECOMMENDATIONS:   Assessment There are no current signs of acute odontogenic infection including abscess, edema or erythema, or suspicious lesion requiring biopsy.   Tooth #3 is missing old restoration and is susceptible to caries/fracture or cracking, #19 is missing old restoration and has recurrent decay as well as radiographic evidence of periapical infection.  Generalized calculus build-up.  Recommendations Extraction of tooth #19 and temporary core-build up/restoration on tooth #3 to decrease the risk of perioperative and postoperative systemic infection and complications.   Establish dental care at an outside office of the patient's choice for routine care including cleanings/periodontal therapy, periodic exams and replacement of missing teeth as needed following heart surgery and medical optimization.  Plan Discuss case with medical team. Referral to oral surgeon for extraction of tooth #19. Return for restorative on tooth #3 and full mouth debridement. Call should any questions or concerns arise.   Discussed in detail all treatment options and recommendations with the patient and they are agreeable to the plan.    Thank you for consulting with [facility] and for the opportunity to participate in this patient's treatment.  Should you have any questions or concerns, please contact the [facility] at [phone].   09/22/2021 Consult Note:  COVID-19 SCREENING:  The patient denies symptoms concerning for COVID-19 infection including fever, chills, cough, or newly developed shortness of breath.   HISTORY OF PRESENT ILLNESS: Marcus Hicks is a very pleasant 55 y.o. male with h/o COPD,  tobacco use (has quit), hypertension, hyperlipidemia, obesity, sleep apnea, heart failure, type 2 diabetes mellitus, chronic hepatitis C infection, persistent atrial fibrillation and long-term use of anticoagulant therapy (on Eliquis) who was recently diagnosed with severe aortic stenosis and is anticipating AVR.  The patient presents today for a medically necessary dental consultation as part of their pre-cardiac surgery work-up.   DENTAL HISTORY: The patient reports that it has been at least 5 years since he has last seen a dentist.  His last dental visit was for a routine cleaning and he had some cavities filled.  He states that he brushes his teeth 2-3 times a day.  He currently denies any dental/orofacial pain or sensitivity. Patient is able to manage oral secretions.  Patient denies dysphagia, odynophagia, dysphonia, SOB and neck pain.  Patient denies fever, rigors and malaise.   CHIEF COMPLAINT:  Here for a preoperative dental exam.   Patient Active Problem List   Diagnosis Date Noted   Secondary hypercoagulable state (HCC) 08/11/2021   Acute kidney injury (HCC) 06/01/2021   COVID-19 virus infection 06/01/2021   Hypotension 06/01/2021   Routine general medical examination at a health care facility 01/28/2021   BMI 38.0-38.9,adult 11/26/2020   Sleep apnea    Persistent atrial fibrillation (HCC)    HFrEF (heart failure with reduced ejection fraction) (HCC) 08/22/2020   Atrial fibrillation with rapid ventricular response (HCC) 08/12/2020   Diabetes mellitus type 2, insulin dependent (HCC)    Hyperlipidemia 10/12/2016   Long-term insulin use (HCC) 10/12/2016   Morbid obesity (HCC) 10/12/2016   Type 2 diabetes mellitus with  diabetic polyneuropathy (HCC) 10/12/2016   Obesity, unspecified 10/12/2016   Bicuspid aortic valve 07/03/2015   Dyslipidemia 07/03/2015   Essential hypertension 07/03/2015   COPD (chronic obstructive pulmonary disease) (HCC) 08/14/2013   Chronic hepatitis C  virus infection (HCC) 08/03/2013   Past Medical History:  Diagnosis Date   Atrial fibrillation with rapid ventricular response (HCC) 08/12/2020   Bicuspid aortic valve    Chronic hepatitis C virus infection (HCC) 08/03/2013   COPD (chronic obstructive pulmonary disease) (HCC)    Diabetes mellitus type 2, insulin dependent (HCC)    Essential hypertension 07/03/2015   Last Assessment & Plan:  Blood pressure is borderline high.  Advised to get better sleep, walk on a regular basis, decrease weight down to about 210 and maintain, and eliminate tobacco.   HFrEF (heart failure with reduced ejection fraction) (HCC) 08/22/2020   Long-term insulin use (HCC) 10/12/2016   Obesity, unspecified 10/12/2016   Patient advised to lose down to about 210 pounds and maintained.  Regular walking program recommended.  He knows how to lose weight.   Sleep apnea    Past Surgical History:  Procedure Laterality Date   CARDIOVERSION N/A 09/24/2020   Procedure: CARDIOVERSION;  Surgeon: [name], MD;  Location: [facility] ENDOSCOPY;  Service: Cardiovascular;  Laterality: N/A;   CARDIOVERSION N/A 08/13/2021   Procedure: CARDIOVERSION;  Surgeon: [name], MD;  Location: [facility] ENDOSCOPY;  Service: Cardiovascular;  Laterality: N/A;   CCTA  08/2019   TEE WITHOUT CARDIOVERSION  09/24/2020   Procedure: TRANSESOPHAGEAL ECHOCARDIOGRAM (TEE);  Surgeon: [name], MD;  Location: [facility] ENDOSCOPY;  Service: Cardiovascular;;   No Known Allergies Current Outpatient Medications  Medication Sig Dispense Refill   albuterol (PROVENTIL) (2.5 MG/3ML) 0.083% nebulizer solution USE 1 VIAL VIA NEBULIZER EVERY 6 HOURS AS NEEDED FOR WHEEZING OR SHORTNESS OF BREATH (Patient taking differently: Take 2.5 mg by nebulization every 6 (six) hours as needed for wheezing.) 150 mL 3   ALPRAZolam (XANAX) 0.5 MG tablet Take 1 tablet (0.5 mg total) by mouth 3 (three) times daily as needed for anxiety. 90 tablet 3   BD PEN NEEDLE NANO  2ND GEN 32G X 4 MM MISC USE AS DIRECTED ONCE DAILY 100 each 4   ELIQUIS 5 MG TABS tablet TAKE 1 TABLET(5 MG) BY MOUTH TWICE DAILY (Patient taking differently: Take 5 mg by mouth 2 (two) times daily.) 180 tablet 1   glucose blood (CONTOUR NEXT TEST) test strip 1 each by Other route as needed for other. Use as instructed 100 each 4   metFORMIN (GLUCOPHAGE) 500 MG tablet TAKE 1 TABLET(500 MG) BY MOUTH TWICE DAILY WITH A MEAL (Patient taking differently: Take 500 mg by mouth 2 (two) times daily with a meal.) 180 tablet 2   metoprolol succinate (TOPROL-XL) 100 MG 24 hr tablet Take 1 tablet (100 mg total) by mouth daily. Take with or immediately following a meal. 90 tablet 3   Microlet Lancets MISC USE TO CHECK BLOOD SUGAR EVERY MORNING AND AFTER MEALS     rosuvastatin (CRESTOR) 40 MG tablet Take 1 tablet (40 mg total) by mouth daily. 90 tablet 3   sacubitril-valsartan (ENTRESTO) 49-51 MG Take 1 tablet by mouth 2 (two) times daily. 180 tablet 3   traMADol (ULTRAM) 50 MG tablet Take 50 mg by mouth every 6 (six) hours as needed for severe pain.     TRESIBA FLEXTOUCH 100 UNIT/ML FlexTouch Pen ADMINISTER 62 UNITS UNDER THE SKIN DAILY 18 mL 6   umeclidinium-vilanterol (ANORO  ELLIPTA) 62.5-25 MCG/INH AEPB Inhale 1 puff into the lungs every morning.     VICTOZA 18 MG/3ML SOPN ADMINISTER 1.2 MG UNDER THE SKIN DAILY (Patient taking differently: Inject 1.2 mg into the skin every morning.) 12 mL 6   zolpidem (AMBIEN) 10 MG tablet TAKE 1 TABLET(10 MG) BY MOUTH AT BEDTIME AS NEEDED FOR SLEEP 30 tablet 2   No current facility-administered medications for this visit.    LABS: Lab Results  Component Value Date   WBC 7.3 08/26/2021   HGB 16.9 08/26/2021   HCT 51.6 (H) 08/26/2021   MCV 91 08/26/2021   PLT 182 08/26/2021      Component Value Date/Time   NA 138 08/26/2021 1534   K 4.7 08/26/2021 1534   CL 98 08/26/2021 1534   CO2 27 08/26/2021 1534   GLUCOSE 165 (H) 08/26/2021 1534   GLUCOSE 113 (H)  08/14/2021 0330   BUN 18 08/26/2021 1534   CREATININE 0.96 08/26/2021 1534   CALCIUM 9.7 08/26/2021 1534   GFRNONAA >60 08/14/2021 0330   GFRAA 110 01/23/2021 1334   No results found for: INR, PROTIME No results found for: PTT  Social History   Socioeconomic History   Marital status: Widowed    Spouse name: Not on file   Number of children: Not on file   Years of education: Not on file   Highest education level: Not on file  Occupational History   Not on file  Tobacco Use   Smoking status: Former   Smokeless tobacco: Former    Types: Snuff   Tobacco comments:    Former snuff 08/11/2021  Vaping Use   Vaping Use: Never used  Substance and Sexual Activity   Alcohol use: Not Currently   Drug use: Never   Sexual activity: Not on file  Other Topics Concern   Not on file  Social History Narrative   Not on file   Social Determinants of Health   Financial Resource Strain: Not on file  Food Insecurity: Not on file  Transportation Needs: Not on file  Physical Activity: Not on file  Stress: Not on file  Social Connections: Not on file  Intimate Partner Violence: Not on file   Family History  Problem Relation Age of Onset   Cancer Maternal Grandfather    Liver disease Sister    Alzheimer's disease Other    Memory loss Other    Hypertension Other     REVIEW OF SYSTEMS:  Reviewed with the patient as per HPI. Psych:  (+) Dental phobia; the patient prefers to be asleep for extractions/surgery.   VITAL SIGNS: BP (!) 156/79 (BP Location: Right Arm, Patient Position: Sitting, Cuff Size: Normal)   Pulse 85   Temp 98.4 F (36.9 C) (Oral)    PHYSICAL EXAM: General:  Well-developed, comfortable and in no apparent distress. Neurological:  Alert and oriented to person, place and  time. Extraoral:  Facial symmetry present without any edema or erythema.  No swelling or lymphadenopathy.  TMJ asymptomatic without clicks or crepitations. (+) Bruxism habit. Intraoral:  Soft  tissues appear well-perfused and mucous membranes moist.  FOM and vestibules soft and not raised. Oral cavity without mass or lesion. No signs of infection, parulis, sinus tract, edema or erythema evident upon exam. (+) Mild dry mouth; less saliva which is thicker and viscous upon clinical exam. (+) Hyperactive gag reflex.   DENTAL EXAM: Hard tissue exam completed and charted.    Overall impression:  Good remaining dentition.  Oral hygiene:  Fair   Periodontal:  Pink, healthy gingival tissue with blunted papilla.  Generalized calculus accumulation. Caries:  #4, #5, #8, #12, #13, #14, #19, #29, #30, #31 (+) Incipient: #12B(V), #13B(V), #20O, #21O Occlusion:  Class I molar occlusion with anterior teeth almost edge to edge. (+) Non-functional teeth:  #17 Other findings:   (+) Attrition/wear:  #5 occlusal, #6 incisal, #7-#10 incisal-lingual, #11 incisal, #12 occlusal, #22-#27 incisal (+) Diastema(s):  #8#9 (+) Impacted: #17 impacted (notable on radiograph)   RADIOGRAPHIC EXAM:  PAN and Full Mouth Series exposed and interpreted.  NOTE: Canine periapical images not exposed due to patient not able to tolerate (strong gag reflex).  Condyles seated bilaterally in fossas.  No evidence of abnormal pathology.  All visualized osseous structures appear WNL. Missing teeth numbers 1, 16 & 32.  #17 is partial bony impacted and is mesially inclined.    Generalized mild horizontal bone loss consistent with mild periodontitis. Radiographic calculus accumulation evident. Existing restorations on teeth numbers 14, 15, 18, and 30.  Attrition/wear vs erosion on anterior maxillary and mandibular teeth.  Incipient lesions: #20D, #21D, #28D.  Caries: #4D, #5D, #12D, #51M&D, #29D, #59M.  #19 has deep decay approximating the pulp with widening PDL space surrounding apex of mesial root; #3 fractured tooth/wear vs deep decay approximating the pulp.   ASSESSMENT:  1.  Severe aortic stenosis 2.  Persistent atrial  fibrillation 3.  Long-term use of anticoagulation (on Eliquis) 4.  Preoperative dental exam 5.  Caries 6.  Missing teeth 7.  Impacted tooth (3rd molar) 8.  Accretions on teeth 9.  Chronic periodontitis 10.  Attrition/wear 11.  Diastema(s) 12.  Incipient lesions 13.  Xerostomia 14.  Bruxism (teeth grinding) 15.  Hyperactive gag reflex 16.  Postoperative bleeding risk 17.  Dental Phobia    PLAN AND RECOMMENDATIONS: I discussed the risks, benefits, and complications of various scenarios with the patient in relationship to their medical and dental conditions, which included systemic infection such as endocarditis, bacteremia or other serious issues that could potentially occur either before, during or after their anticipated heart surgery if dental/oral concerns are not addressed.  I explained that if any chronic or acute dental/oral infection(s) are addressed and subsequently not maintained following medical optimization and recovery, their risk of the previously mentioned complications are just as high and could potentially occur postoperatively.  I explained all significant findings of the dental consultation with the patient including findings regarding teeth numbers 3 and 19- #3 is missing a large portion of the coronal tooth structure but not carious, #19 is missing >50% of coronal tooth structure and is carious, several other teeth with cavities/incipient lesions and generalized calculus or tartar build up, and the recommended care including full-coverage crowns on #3 and #19 with root canal therapy + post/core on #19 and a cleaning/debridement in order to optimize them for heart surgery from a dental standpoint.  The patient verbalized understanding of all findings, discussion, and recommendations. We then discussed various treatment options to include no treatment, multiple extractions with alveoloplasty, pre-prosthetic surgery as indicated, periodontal therapy, dental restorations, root  canal therapy, crown and bridge therapy, implant therapy, and replacement of missing teeth as indicated.  We also explored alternative options to treatment on teeth #'s 3 and #19 including extraction, restorative on #3 for now (stressed that this filling would only be temporary due to the loss of natural tooth structure and he will need to have this addressed with a crown vs extraction in the  future).  The patient verbalized understanding of all options, and currently wishes to proceed with extraction of tooth #19 and temporary large restoration/build-up on #3 due to financial constraints at this time.  He also wishes to proceed with full mouth debridement as recommended. Plan to discuss all findings and recommendations with medical team.   The patient will need to establish care at a dental office of his choice for routine dental care including replacement of missing teeth as needed, cleanings and exams after heart surgery, recovery and medical optimization.  Antibiotic prophylaxis with antibiotic will be indicated s/p heart valve surgery for any invasive dental procedures.  All questions and concerns were invited and addressed.  The patient tolerated today's visit well and departed in stable condition.  I spent in excess of 120 minutes during the conduct of this consultation and >50% of this time involved direct face-to-face encounter for counseling and/or coordination of the patient's care.      Imani Fiebelkorn B. Chales Salmon, D.M.D.

## 2021-09-22 NOTE — Patient Instructions (Signed)
[facility] [facility] Department of Dental Medicine Yuriana Gaal B. Kitti Mcclish, D.M.D. Phone: [phone] Fax: [phone]   It was a pleasure seeing you today!  Please refer to the information below regarding your dental visit with us.  Call us if any questions or concerns come up after you leave.   Thank you for letting us provide care for you.  If there is anything we can do for you, please let us know.    HEART VALVES AND MOUTH CARE   FACTS: If you have any infection in your mouth, it can infect your heart valve. If you heart valve is infected, you will be seriously ill. Infections in the mouth can be SILENT and do not always cause pain. Examples of infections in the mouth are gum disease, dental cavities, and abscesses. Some possible signs of infection are: Bad breath, bleeding gums, or teeth that are sensitive to sweets, hot, and/or cold. There are many other signs as well.   WHAT YOU HAVE TO DO: Brush your teeth after meals and at bedtime.  Spend at least 2 minutes brushing well, especially behind your back teeth and all around your teeth that stand alone.  Brush at the gumline also. Do not go to bed without brushing your teeth and flossing. If your gums bleed when you brush or floss, do NOT stop brushing or flossing.  Bleeding can be a sign of inflammation or irritation from bacteria.  It usually means that your gums need more attention and better cleaning.  If your dentist or Dr. Courney Garrod gave you a prescription mouthwash to use, make sure to use it as directed. If you run out of the medication, get a refill at the pharmacy. If you were given any other medications or directions by your dentist, please follow them.  If you did not understand the directions or forget what you were told, please call.  We will be happy to refresh your memory. If you need antibiotics before dental procedures, make sure you take them one hour prior to every dental visit as directed.  Get a dental check-up every 4-6  months in order to keep your mouth healthy, or to find and treat any new infection. You will most likely need your teeth cleaned or gums treated at the same time. If you are not able to come in for your scheduled appointment, call your dentist as soon as possible to reschedule. If you have a problem in between dental visits, call your dentist.   QUESTIONS? Call our office during office hours [phone].    WE ARE A TEAM.  OUR GOAL IS:  HEALTHY MOUTH, HEALTHY HEART   

## 2021-09-23 ENCOUNTER — Telehealth: Payer: Self-pay

## 2021-09-23 ENCOUNTER — Other Ambulatory Visit: Payer: 59

## 2021-09-23 DIAGNOSIS — I5020 Unspecified systolic (congestive) heart failure: Secondary | ICD-10-CM

## 2021-09-23 DIAGNOSIS — I4819 Other persistent atrial fibrillation: Secondary | ICD-10-CM

## 2021-09-23 LAB — CBC WITH DIFFERENTIAL/PLATELET
Absolute Monocytes: 600 cells/uL (ref 200–950)
Basophils Absolute: 48 cells/uL (ref 0–200)
Basophils Relative: 0.6 %
Eosinophils Absolute: 120 cells/uL (ref 15–500)
Eosinophils Relative: 1.5 %
HCT: 51.7 % — ABNORMAL HIGH (ref 38.5–50.0)
Hemoglobin: 17.2 g/dL — ABNORMAL HIGH (ref 13.2–17.1)
Lymphs Abs: 1560 cells/uL (ref 850–3900)
MCH: 29.8 pg (ref 27.0–33.0)
MCHC: 33.3 g/dL (ref 32.0–36.0)
MCV: 89.6 fL (ref 80.0–100.0)
MPV: 11.1 fL (ref 7.5–12.5)
Monocytes Relative: 7.5 %
Neutro Abs: 5672 cells/uL (ref 1500–7800)
Neutrophils Relative %: 70.9 %
Platelets: 199 10*3/uL (ref 140–400)
RBC: 5.77 10*6/uL (ref 4.20–5.80)
RDW: 12.4 % (ref 11.0–15.0)
Total Lymphocyte: 19.5 %
WBC: 8 10*3/uL (ref 3.8–10.8)

## 2021-09-23 LAB — HEPATIC FUNCTION PANEL
AG Ratio: 1.8 (calc) (ref 1.0–2.5)
ALT: 34 U/L (ref 9–46)
AST: 29 U/L (ref 10–35)
Albumin: 4.5 g/dL (ref 3.6–5.1)
Alkaline phosphatase (APISO): 44 U/L (ref 35–144)
Bilirubin, Direct: 0.1 mg/dL (ref 0.0–0.2)
Globulin: 2.5 g/dL (calc) (ref 1.9–3.7)
Indirect Bilirubin: 0.5 mg/dL (calc) (ref 0.2–1.2)
Total Bilirubin: 0.6 mg/dL (ref 0.2–1.2)
Total Protein: 7 g/dL (ref 6.1–8.1)

## 2021-09-23 LAB — BASIC METABOLIC PANEL
BUN/Creatinine Ratio: 15 (ref 9–20)
BUN: 14 mg/dL (ref 6–24)
CO2: 30 mmol/L — ABNORMAL HIGH (ref 20–29)
Calcium: 9.9 mg/dL (ref 8.7–10.2)
Chloride: 100 mmol/L (ref 96–106)
Creatinine, Ser: 0.91 mg/dL (ref 0.76–1.27)
Glucose: 129 mg/dL — ABNORMAL HIGH (ref 70–99)
Potassium: 4.9 mmol/L (ref 3.5–5.2)
Sodium: 139 mmol/L (ref 134–144)
eGFR: 99 mL/min/{1.73_m2} (ref >59)

## 2021-09-23 LAB — PROTIME-INR
INR: 1.1
Prothrombin Time: 10.9 s (ref 9.0–11.5)

## 2021-09-23 NOTE — Telephone Encounter (Signed)
Return call received. Pt scheduled for cardiac cath this Friday 09/26/2021 for work up for Dr. Cliffton Asters.  Pt will come to office for BMP and to get instruction letter.

## 2021-09-23 NOTE — Telephone Encounter (Signed)
Unable to reach Pt at home or cell #.  Left message requesting call back.

## 2021-09-24 NOTE — Telephone Encounter (Signed)
Patient states on the instruction sheets it states someone must be with you the first 24 hours. He says he does not think it applies to him, but wants to make sure.

## 2021-09-24 NOTE — Telephone Encounter (Signed)
Returned call to Pt.  Advised he did need someone to be with him for 24 hours post discharge.

## 2021-09-25 ENCOUNTER — Telehealth: Payer: Self-pay | Admitting: *Deleted

## 2021-09-25 NOTE — Telephone Encounter (Signed)
Cardiac catheterization scheduled at [facility] for: Friday September 26, 2021 12 Noon Arrive [facility] Main Entrance A [facility]) at: 10 AM   No solid food after midnight prior to cath, clear liquids until 5 AM day of procedure.  Medication instructions: Hold: Eliquis-none 09/24/21 until post procedure  Metformin-none day of procedure and 48 hours post procedure Treshiba/Victoza-AM of procedure  Except hold medications usual morning medications can be taken pre-cath with sips of water including aspirin 81 mg.    Confirmed patient has responsible adult to drive home post procedure and be with patient first 24 hours after arriving home.  [facility] does allow one visitor to accompany you and wait in the hospital waiting room while you are there for your procedure. You and your visitor will be asked to wear a mask once you enter the hospital.   Patient reports does not currently have any new symptoms concerning for COVID-19 and no household members with COVID-19 like illness.     Reviewed procedure/mask/visitor instructions with patient.

## 2021-09-26 ENCOUNTER — Encounter (HOSPITAL_COMMUNITY)
Admission: RE | Disposition: A | Discharge status: Home / Self Care | Payer: Self-pay | Source: Home / Self Care | Attending: Cardiology

## 2021-09-26 ENCOUNTER — Ambulatory Visit (HOSPITAL_COMMUNITY)
Admission: RE | Admit: 2021-09-26 | Discharge: 2021-09-26 | Disposition: A | Discharge status: Home / Self Care | Payer: 59 | Attending: Cardiology | Admitting: Cardiology

## 2021-09-26 DIAGNOSIS — Z79899 Other long term (current) drug therapy: Secondary | ICD-10-CM | POA: Diagnosis not present

## 2021-09-26 DIAGNOSIS — I4819 Other persistent atrial fibrillation: Secondary | ICD-10-CM | POA: Insufficient documentation

## 2021-09-26 DIAGNOSIS — Z87891 Personal history of nicotine dependence: Secondary | ICD-10-CM | POA: Diagnosis not present

## 2021-09-26 DIAGNOSIS — Q231 Congenital insufficiency of aortic valve: Secondary | ICD-10-CM

## 2021-09-26 DIAGNOSIS — Z7984 Long term (current) use of oral hypoglycemic drugs: Secondary | ICD-10-CM | POA: Diagnosis not present

## 2021-09-26 DIAGNOSIS — I272 Pulmonary hypertension, unspecified: Secondary | ICD-10-CM | POA: Diagnosis not present

## 2021-09-26 DIAGNOSIS — Z7985 Long-term (current) use of injectable non-insulin antidiabetic drugs: Secondary | ICD-10-CM | POA: Insufficient documentation

## 2021-09-26 DIAGNOSIS — Z8249 Family history of ischemic heart disease and other diseases of the circulatory system: Secondary | ICD-10-CM | POA: Insufficient documentation

## 2021-09-26 DIAGNOSIS — R0602 Shortness of breath: Secondary | ICD-10-CM | POA: Diagnosis not present

## 2021-09-26 DIAGNOSIS — E785 Hyperlipidemia, unspecified: Secondary | ICD-10-CM | POA: Diagnosis present

## 2021-09-26 DIAGNOSIS — I350 Nonrheumatic aortic (valve) stenosis: Secondary | ICD-10-CM | POA: Diagnosis not present

## 2021-09-26 DIAGNOSIS — I10 Essential (primary) hypertension: Secondary | ICD-10-CM | POA: Diagnosis present

## 2021-09-26 DIAGNOSIS — E1142 Type 2 diabetes mellitus with diabetic polyneuropathy: Secondary | ICD-10-CM

## 2021-09-26 DIAGNOSIS — I2510 Atherosclerotic heart disease of native coronary artery without angina pectoris: Secondary | ICD-10-CM | POA: Insufficient documentation

## 2021-09-26 DIAGNOSIS — Z7901 Long term (current) use of anticoagulants: Secondary | ICD-10-CM | POA: Insufficient documentation

## 2021-09-26 HISTORY — PX: RIGHT/LEFT HEART CATH AND CORONARY ANGIOGRAPHY: CATH118266

## 2021-09-26 LAB — POCT I-STAT 7, (LYTES, BLD GAS, ICA,H+H)
Acid-Base Excess: 2 mmol/L (ref 0.0–2.0)
Bicarbonate: 27.5 mmol/L (ref 20.0–28.0)
Calcium, Ion: 1.18 mmol/L (ref 1.15–1.40)
HCT: 51 % (ref 39.0–52.0)
Hemoglobin: 17.3 g/dL — ABNORMAL HIGH (ref 13.0–17.0)
O2 Saturation: 99 %
Potassium: 4.1 mmol/L (ref 3.5–5.1)
Sodium: 141 mmol/L (ref 135–145)
TCO2: 29 mmol/L (ref 22–32)
pCO2 arterial: 45.3 mmHg (ref 32.0–48.0)
pH, Arterial: 7.392 (ref 7.350–7.450)
pO2, Arterial: 124 mmHg — ABNORMAL HIGH (ref 83.0–108.0)

## 2021-09-26 LAB — POCT I-STAT EG7
Acid-Base Excess: 4 mmol/L — ABNORMAL HIGH (ref 0.0–2.0)
Acid-Base Excess: 4 mmol/L — ABNORMAL HIGH (ref 0.0–2.0)
Bicarbonate: 31 mmol/L — ABNORMAL HIGH (ref 20.0–28.0)
Bicarbonate: 31.4 mmol/L — ABNORMAL HIGH (ref 20.0–28.0)
Calcium, Ion: 1.18 mmol/L (ref 1.15–1.40)
Calcium, Ion: 1.21 mmol/L (ref 1.15–1.40)
HCT: 51 % (ref 39.0–52.0)
HCT: 51 % (ref 39.0–52.0)
Hemoglobin: 17.3 g/dL — ABNORMAL HIGH (ref 13.0–17.0)
Hemoglobin: 17.3 g/dL — ABNORMAL HIGH (ref 13.0–17.0)
O2 Saturation: 69 %
O2 Saturation: 69 %
Potassium: 4.2 mmol/L (ref 3.5–5.1)
Potassium: 4.3 mmol/L (ref 3.5–5.1)
Sodium: 141 mmol/L (ref 135–145)
Sodium: 142 mmol/L (ref 135–145)
TCO2: 33 mmol/L — ABNORMAL HIGH (ref 22–32)
TCO2: 33 mmol/L — ABNORMAL HIGH (ref 22–32)
pCO2, Ven: 54.8 mmHg (ref 44.0–60.0)
pCO2, Ven: 55.4 mmHg (ref 44.0–60.0)
pH, Ven: 7.36 (ref 7.250–7.430)
pH, Ven: 7.361 (ref 7.250–7.430)
pO2, Ven: 38 mmHg (ref 32.0–45.0)
pO2, Ven: 39 mmHg (ref 32.0–45.0)

## 2021-09-26 LAB — GLUCOSE, CAPILLARY
Glucose-Capillary: 116 mg/dL — ABNORMAL HIGH (ref 70–99)
Glucose-Capillary: 113 mg/dL — ABNORMAL HIGH (ref 70–99)

## 2021-09-26 SURGERY — RIGHT/LEFT HEART CATH AND CORONARY ANGIOGRAPHY
Anesthesia: LOCAL

## 2021-09-26 MED ORDER — SODIUM CHLORIDE 0.9 % IV SOLN: 250.0000 mL | INTRAVENOUS | Status: DC | PRN

## 2021-09-26 MED ORDER — VERAPAMIL HCL 2.5 MG/ML IV SOLN
INTRAVENOUS | Status: DC | PRN
  Administered 2021-09-26: 10 mL via INTRA_ARTERIAL

## 2021-09-26 MED ORDER — SODIUM CHLORIDE 0.9 % WEIGHT BASED INFUSION: 1.0000 mL/kg/h | INTRAVENOUS | Status: DC

## 2021-09-26 MED ORDER — ONDANSETRON HCL 4 MG/2ML IJ SOLN: 4.0000 mg | Freq: Four times a day (QID) | INTRAMUSCULAR | Status: DC | PRN

## 2021-09-26 MED ORDER — MIDAZOLAM HCL 2 MG/2ML IJ SOLN
INTRAMUSCULAR | Status: AC
  Filled 2021-09-26: qty 2

## 2021-09-26 MED ORDER — MIDAZOLAM HCL 2 MG/2ML IJ SOLN
INTRAMUSCULAR | Status: DC | PRN
  Administered 2021-09-26: 2 mg via INTRAVENOUS

## 2021-09-26 MED ORDER — LIDOCAINE HCL (PF) 1 % IJ SOLN
INTRAMUSCULAR | Status: AC
  Filled 2021-09-26: qty 30

## 2021-09-26 MED ORDER — SODIUM CHLORIDE 0.9% FLUSH: 3.0000 mL | INTRAVENOUS | Status: DC | PRN

## 2021-09-26 MED ORDER — VERAPAMIL HCL 2.5 MG/ML IV SOLN
INTRAVENOUS | Status: AC
  Filled 2021-09-26: qty 2

## 2021-09-26 MED ORDER — ASPIRIN 81 MG PO CHEW: 81.0000 mg | CHEWABLE_TABLET | ORAL | Status: DC

## 2021-09-26 MED ORDER — SODIUM CHLORIDE 0.9% FLUSH: 3.0000 mL | Freq: Two times a day (BID) | INTRAVENOUS | Status: DC

## 2021-09-26 MED ORDER — FENTANYL CITRATE (PF) 100 MCG/2ML IJ SOLN
INTRAMUSCULAR | Status: DC | PRN
  Administered 2021-09-26: 25 ug via INTRAVENOUS

## 2021-09-26 MED ORDER — FENTANYL CITRATE (PF) 100 MCG/2ML IJ SOLN
INTRAMUSCULAR | Status: AC
  Filled 2021-09-26: qty 2

## 2021-09-26 MED ORDER — HEPARIN SODIUM (PORCINE) 1000 UNIT/ML IJ SOLN
INTRAMUSCULAR | Status: AC
  Filled 2021-09-26: qty 1

## 2021-09-26 MED ORDER — HEPARIN (PORCINE) IN NACL 1000-0.9 UT/500ML-% IV SOLN
INTRAVENOUS | Status: AC
  Filled 2021-09-26: qty 1000

## 2021-09-26 MED ORDER — LIDOCAINE HCL (PF) 1 % IJ SOLN
INTRAMUSCULAR | Status: DC | PRN
  Administered 2021-09-26 (×2): 2 mL

## 2021-09-26 MED ORDER — SODIUM CHLORIDE 0.9 % WEIGHT BASED INFUSION
3.0000 mL/kg/h | INTRAVENOUS | Status: AC
  Administered 2021-09-26: 3 mL/kg/h via INTRAVENOUS

## 2021-09-26 MED ORDER — ACETAMINOPHEN 325 MG PO TABS: 650.0000 mg | ORAL_TABLET | ORAL | Status: DC | PRN

## 2021-09-26 MED ORDER — HEPARIN SODIUM (PORCINE) 1000 UNIT/ML IJ SOLN
INTRAMUSCULAR | Status: DC | PRN
  Administered 2021-09-26: 5000 [IU] via INTRAVENOUS

## 2021-09-26 MED ORDER — IOHEXOL 350 MG/ML SOLN
INTRAVENOUS | Status: DC | PRN
  Administered 2021-09-26: 45 mL

## 2021-09-26 MED ORDER — FUROSEMIDE 20 MG PO TABS: 20.0000 mg | ORAL_TABLET | Freq: Every day | ORAL | 11 refills | Status: DC

## 2021-09-26 MED ORDER — METFORMIN HCL 500 MG PO TABS
ORAL_TABLET | ORAL | 2 refills | Status: DC
Start: 2021-09-29 — End: 2021-11-04

## 2021-09-26 MED ORDER — HEPARIN (PORCINE) IN NACL 1000-0.9 UT/500ML-% IV SOLN
INTRAVENOUS | Status: DC | PRN
  Administered 2021-09-26 (×2): 500 mL

## 2021-09-26 SURGICAL SUPPLY — 11 items

## 2021-09-26 NOTE — Discharge Instructions (Addendum)
May resume Eliquis tonight.   Start lasix 20 mg daily for fluid

## 2021-09-26 NOTE — Interval H&P Note (Signed)
History and Physical Interval Note:  09/26/2021 11:24 AM  Marcus Hicks  has presented today for surgery, with the diagnosis of Atrial Fibrillation.  The various methods of treatment have been discussed with the patient and family. After consideration of risks, benefits and other options for treatment, the patient has consented to  Procedure(s): RIGHT/LEFT HEART CATH AND CORONARY ANGIOGRAPHY (N/A) as a surgical intervention.  The patient's history has been reviewed, patient examined, no change in status, stable for surgery.  I have reviewed the patient's chart and labs.  Questions were answered to the patient's satisfaction.     Theron Arista [facility] 09/26/2021 11:24 AM

## 2021-09-28 NOTE — Progress Notes (Signed)
Hemoglobin remains high, pO2 124 good,  lp

## 2021-09-29 ENCOUNTER — Encounter (HOSPITAL_COMMUNITY): Payer: Self-pay | Admitting: Cardiology

## 2021-10-02 ENCOUNTER — Ambulatory Visit
Admission: RE | Admit: 2021-10-02 | Discharge: 2021-10-02 | Disposition: A | Discharge status: Home / Self Care | Payer: 59 | Source: Ambulatory Visit | Attending: Thoracic Surgery (Cardiothoracic Vascular Surgery) | Admitting: Thoracic Surgery (Cardiothoracic Vascular Surgery)

## 2021-10-02 DIAGNOSIS — I712 Thoracic aortic aneurysm, without rupture, unspecified: Secondary | ICD-10-CM

## 2021-10-02 MED ORDER — IOPAMIDOL (ISOVUE-370) INJECTION 76%
75.0000 mL | Freq: Once | INTRAVENOUS | Status: AC | PRN
  Administered 2021-10-02: 75 mL via INTRAVENOUS

## 2021-10-03 ENCOUNTER — Other Ambulatory Visit: Payer: Self-pay | Admitting: *Deleted

## 2021-10-03 ENCOUNTER — Ambulatory Visit (INDEPENDENT_AMBULATORY_CARE_PROVIDER_SITE_OTHER): Payer: 59 | Admitting: Thoracic Surgery (Cardiothoracic Vascular Surgery)

## 2021-10-03 ENCOUNTER — Other Ambulatory Visit: Payer: Self-pay

## 2021-10-03 VITALS — BP 118/79 | HR 97 | Resp 20 | Ht 64.0 in | Wt 230.0 lb

## 2021-10-03 DIAGNOSIS — I712 Thoracic aortic aneurysm, without rupture, unspecified: Secondary | ICD-10-CM | POA: Diagnosis not present

## 2021-10-03 DIAGNOSIS — I4819 Other persistent atrial fibrillation: Secondary | ICD-10-CM

## 2021-10-03 DIAGNOSIS — I350 Nonrheumatic aortic (valve) stenosis: Secondary | ICD-10-CM

## 2021-10-03 NOTE — H&P (View-Only) (Signed)
    [address]       [place] [address]             [phone]        Marcus Hicks [facility] Medical Record #[id number] Date of Birth: 12/18/1965  Referring: Lambert, Cameron T, MD Primary Care: [place], Lawrence Edward, MD Primary Cardiologist:None  Chief Complaint:    Chief Complaint  Patient presents with   Thoracic Aortic Aneurysm    Chest CT 10/02/21    History of Present Illness:     [name] presents in follow-up.  He was recently seen as an inpatient for severe atrial fibrillation and moderate aortic stenosis due to bicuspid valve.  He continues to be quite short of breath and tachycardic.  He has had most of his work-up, and is awaiting dental clearance.   Past Medical and Surgical History: Previous Chest Surgery: No Previous Chest Radiation: No Diabetes Mellitus: Yes.  HbA1C 7.2 Creatinine: 0.91  Past Medical History:  Diagnosis Date   Atrial fibrillation with rapid ventricular response (HCC) 08/12/2020   Bicuspid aortic valve    Chronic hepatitis C virus infection (HCC) 08/03/2013   COPD (chronic obstructive pulmonary disease) (HCC)    Diabetes mellitus type 2, insulin dependent (HCC)    Essential hypertension 07/03/2015   Last Assessment & Plan:  Blood pressure is borderline high.  Advised to get better sleep, walk on a regular basis, decrease weight down to about 210 and maintain, and eliminate tobacco.   HFrEF (heart failure with reduced ejection fraction) (HCC) 08/22/2020   Long-term insulin use (HCC) 10/12/2016   Obesity, unspecified 10/12/2016   Patient advised to lose down to about 210 pounds and maintained.  Regular walking program recommended.  He knows how to lose weight.   Sleep apnea     Past Surgical History:  Procedure Laterality Date   CARDIOVERSION N/A 09/24/2020   Procedure: CARDIOVERSION;  Surgeon: Schumann, Christopher L, MD;  Location: [facility];  Service: Cardiovascular;  Laterality: N/A;   CARDIOVERSION N/A  08/13/2021   Procedure: CARDIOVERSION;  Surgeon: Acharya, Gayatri A, MD;  Location: [facility]OS[facility];  Service: Cardiovascular;  Laterality: N/A;   CCTA  08/2019   RIGHT/LEFT HEART CATH AND CORONARY ANGIOGRAPHY N/A 09/26/2021   Procedure: RIGHT/LEFT HEART CATH AND CORONARY ANGIOGRAPHY;  Surgeon: Jordan, Peter M, MD;  Location: [facility] INVASIVE CV LAB;  Service: Cardiovascular;  Laterality: N/A;   TEE WITHOUT CARDIOVERSION  09/24/2020   Procedure: TRANSESOPHAGEAL ECHOCARDIOGRAM (TEE);  Surgeon: Schumann, Christopher L, MD;  Location: [facility]OS[facility];  Service: Cardiovascular;;    Social History: Support: Presents today with his daughter  Social History   Tobacco Use  Smoking Status Former  Smokeless Tobacco Former   Types: Snuff  Tobacco Comments   Former snuff 08/11/2021    Social History   Substance and Sexual Activity  Alcohol Use Not Currently     No Known Allergies    Current Outpatient Medications  Medication Sig Dispense Refill   albuterol (PROVENTIL) (2.5 MG/3ML) 0.083% nebulizer solution USE 1 VIAL VIA NEBULIZER EVERY 6 HOURS AS NEEDED FOR WHEEZING OR SHORTNESS OF BREATH (Patient taking differently: Take 2.5 mg by nebulization every 6 (six) hours as needed for wheezing.) 150 mL 3   ALPRAZolam (XANAX) 0.5 MG tablet Take 1 tablet (0.5 mg total) by mouth 3 (three) times daily as needed for anxiety. 90 tablet 3   BD PEN NEEDLE NANO 2ND GEN 32G X 4 MM MISC USE AS DIRECTED ONCE   DAILY 100 each 4   ELIQUIS 5 MG TABS tablet TAKE 1 TABLET(5 MG) BY MOUTH TWICE DAILY 180 tablet 1   furosemide (LASIX) 20 MG tablet Take 1 tablet (20 mg total) by mouth daily. 30 tablet 11   glucose blood (CONTOUR NEXT TEST) test strip 1 each by Other route as needed for other. Use as instructed 100 each 4   metFORMIN (GLUCOPHAGE) 500 MG tablet TAKE 1 TABLET(500 MG) BY MOUTH TWICE DAILY WITH A MEAL 180 tablet 2   metoprolol succinate (TOPROL-XL) 100 MG 24 hr tablet Take 1 tablet (100 mg total) by mouth daily.  Take with or immediately following a meal. 90 tablet 3   Microlet Lancets MISC USE TO CHECK BLOOD SUGAR EVERY MORNING AND AFTER MEALS     rosuvastatin (CRESTOR) 40 MG tablet Take 1 tablet (40 mg total) by mouth daily. 90 tablet 3   sacubitril-valsartan (ENTRESTO) 49-51 MG Take 1 tablet by mouth 2 (two) times daily. 180 tablet 3   traMADol (ULTRAM) 50 MG tablet Take 50 mg by mouth every 6 (six) hours as needed for severe pain.     TRESIBA FLEXTOUCH 100 UNIT/ML FlexTouch Pen ADMINISTER 62 UNITS UNDER THE SKIN DAILY 18 mL 6   umeclidinium-vilanterol (ANORO ELLIPTA) 62.5-25 MCG/INH AEPB Inhale 1 puff into the lungs every morning.     VICTOZA 18 MG/3ML SOPN ADMINISTER 1.2 MG UNDER THE SKIN DAILY (Patient taking differently: Inject 1.2 mg into the skin every morning.) 12 mL 6   zolpidem (AMBIEN) 10 MG tablet TAKE 1 TABLET(10 MG) BY MOUTH AT BEDTIME AS NEEDED FOR SLEEP (Patient taking differently: Take 10 mg by mouth at bedtime.) 30 tablet 2   No current facility-administered medications for this visit.    (Not in a hospital admission)   Family History  Problem Relation Age of Onset   Cancer Maternal Grandfather    Liver disease Sister    Alzheimer's disease Other    Memory loss Other    Hypertension Other      Review of Systems:   Review of Systems  Constitutional:  Positive for malaise/fatigue.  Respiratory:  Positive for shortness of breath.   Cardiovascular:  Positive for chest pain and palpitations.  Psychiatric/Behavioral:  The patient is nervous/anxious.      Physical Exam: BP 118/79   Pulse 97   Resp 20   Ht 5\' 4"  (1.626 m)   Wt 230 lb (104.3 kg)   SpO2 95% Comment: RA  BMI 39.48 kg/m  Physical Exam Constitutional:      Appearance: He is obese. He is diaphoretic.  Eyes:     Extraocular Movements: Extraocular movements intact.  Cardiovascular:     Rate and Rhythm: Normal rate.  Pulmonary:     Effort: Pulmonary effort is normal. No respiratory distress.   Musculoskeletal:        General: Normal range of motion.  Skin:    General: Skin is warm.  Neurological:     General: No focal deficit present.     Mental Status: He is alert and oriented to person, place, and time.      Diagnostic Studies & Laboratory data:  I have independently reviewed the above radiologic studies and discussed with the patient   Recent Lab Findings: Lab Results  Component Value Date   WBC 8.0 09/22/2021   HGB 17.3 (H) 09/26/2021   HCT 51.0 09/26/2021   PLT 199 09/22/2021   GLUCOSE 129 (H) 09/23/2021   CHOL 184 08/26/2021  TRIG 186 (H) 08/26/2021   HDL 42 08/26/2021   LDLCALC 109 (H) 08/26/2021   ALT 34 09/22/2021   AST 29 09/22/2021   NA 141 09/26/2021   K 4.3 09/26/2021   CL 100 09/23/2021   CREATININE 0.91 09/23/2021   BUN 14 09/23/2021   CO2 30 (H) 09/23/2021   INR 1.1 09/22/2021   HGBA1C 7.2 (H) 08/26/2021      Assessment / Plan:   55-year-old male with bicuspid aortic valve, moderate aortic stenosis, and persistent atrial fibrillation.  He is still awaiting dental clearance.  He underwent a left heart cath which did not show any significant coronary disease and a CT chest which did not show any aneurysmal disease.  He remains short of breath, and is agreeable to proceed with surgery after undergoing his last 2 dental procedures.  He is tentatively scheduled for a mechanical aortic valve replacement along with Maze procedure and left atrial clip.  His Eliquis will need to be held for 3 days.     I  spent 40 minutes counseling the patient face to face.   Belton Peplinski O Nyela Cortinas 10/03/2021 3:57 PM       

## 2021-10-03 NOTE — Progress Notes (Signed)
[address]       [place] [address]             [phone]        Marcus Hicks [facility] Medical Record #[id number] Date of Birth: 1966/07/20  Referring: Marcus Prude, MD Primary Care: Marcus Miyamoto, MD Primary Cardiologist:None  Chief Complaint:    Chief Complaint  Patient presents with   Thoracic Aortic Aneurysm    Chest CT 10/02/21    History of Present Illness:     [name] presents in follow-up.  He was recently seen as an inpatient for severe atrial fibrillation and moderate aortic stenosis due to bicuspid valve.  He continues to be quite short of breath and tachycardic.  He has had most of his work-up, and is awaiting dental clearance.   Past Medical and Surgical History: Previous Chest Surgery: No Previous Chest Radiation: No Diabetes Mellitus: Yes.  HbA1C 7.2 Creatinine: 0.91  Past Medical History:  Diagnosis Date   Atrial fibrillation with rapid ventricular response (HCC) 08/12/2020   Bicuspid aortic valve    Chronic hepatitis C virus infection (HCC) 08/03/2013   COPD (chronic obstructive pulmonary disease) (HCC)    Diabetes mellitus type 2, insulin dependent (HCC)    Essential hypertension 07/03/2015   Last Assessment & Plan:  Blood pressure is borderline high.  Advised to get better sleep, walk on a regular basis, decrease weight down to about 210 and maintain, and eliminate tobacco.   HFrEF (heart failure with reduced ejection fraction) (HCC) 08/22/2020   Long-term insulin use (HCC) 10/12/2016   Obesity, unspecified 10/12/2016   Patient advised to lose down to about 210 pounds and maintained.  Regular walking program recommended.  He knows how to lose weight.   Sleep apnea     Past Surgical History:  Procedure Laterality Date   CARDIOVERSION N/A 09/24/2020   Procedure: CARDIOVERSION;  Surgeon: [name], MD;  Location[facility] ENDOSCOPY;  Service: Cardiovascular;  Laterality: N/A;   CARDIOVERSION N/A  08/13/2021   Procedure: CARDIOVERSION;  Surgeon: [name], MD;  Location: [facility] ENDOSCOPY;  Service: Cardiovascular;  Laterality: N/A;   CCTA  08/2019   RIGHT/LEFT HEART CATH AND CORONARY ANGIOGRAPHY N/A 09/26/2021   Procedure: RIGHT/LEFT HEART CATH AND CORONARY ANGIOGRAPHY;  Surgeon: [place], Peter M, MD;  Location: [facility] INVASIVE CV LAB;  Service: Cardiovascular;  Laterality: N/A;   TEE WITHOUT CARDIOVERSION  09/24/2020   Procedure: TRANSESOPHAGEAL ECHOCARDIOGRAM (TEE);  Surgeon: [name], MD;  Location: [facility] ENDOSCOPY;  Service: Cardiovascular;;    Social History: Support: Presents today with his daughter  Social History   Tobacco Use  Smoking Status Former  Smokeless Tobacco Former   Types: Snuff  Tobacco Comments   Former snuff 08/11/2021    Social History   Substance and Sexual Activity  Alcohol Use Not Currently     No Known Allergies    Current Outpatient Medications  Medication Sig Dispense Refill   albuterol (PROVENTIL) (2.5 MG/3ML) 0.083% nebulizer solution USE 1 VIAL VIA NEBULIZER EVERY 6 HOURS AS NEEDED FOR WHEEZING OR SHORTNESS OF BREATH (Patient taking differently: Take 2.5 mg by nebulization every 6 (six) hours as needed for wheezing.) 150 mL 3   ALPRAZolam (XANAX) 0.5 MG tablet Take 1 tablet (0.5 mg total) by mouth 3 (three) times daily as needed for anxiety. 90 tablet 3   BD PEN NEEDLE NANO 2ND GEN 32G X 4 MM MISC USE AS DIRECTED ONCE  DAILY 100 each 4   ELIQUIS 5 MG TABS tablet TAKE 1 TABLET(5 MG) BY MOUTH TWICE DAILY 180 tablet 1   furosemide (LASIX) 20 MG tablet Take 1 tablet (20 mg total) by mouth daily. 30 tablet 11   glucose blood (CONTOUR NEXT TEST) test strip 1 each by Other route as needed for other. Use as instructed 100 each 4   metFORMIN (GLUCOPHAGE) 500 MG tablet TAKE 1 TABLET(500 MG) BY MOUTH TWICE DAILY WITH A MEAL 180 tablet 2   metoprolol succinate (TOPROL-XL) 100 MG 24 hr tablet Take 1 tablet (100 mg total) by mouth daily.  Take with or immediately following a meal. 90 tablet 3   Microlet Lancets MISC USE TO CHECK BLOOD SUGAR EVERY MORNING AND AFTER MEALS     rosuvastatin (CRESTOR) 40 MG tablet Take 1 tablet (40 mg total) by mouth daily. 90 tablet 3   sacubitril-valsartan (ENTRESTO) 49-51 MG Take 1 tablet by mouth 2 (two) times daily. 180 tablet 3   traMADol (ULTRAM) 50 MG tablet Take 50 mg by mouth every 6 (six) hours as needed for severe pain.     TRESIBA FLEXTOUCH 100 UNIT/ML FlexTouch Pen ADMINISTER 62 UNITS UNDER THE SKIN DAILY 18 mL 6   umeclidinium-vilanterol (ANORO ELLIPTA) 62.5-25 MCG/INH AEPB Inhale 1 puff into the lungs every morning.     VICTOZA 18 MG/3ML SOPN ADMINISTER 1.2 MG UNDER THE SKIN DAILY (Patient taking differently: Inject 1.2 mg into the skin every morning.) 12 mL 6   zolpidem (AMBIEN) 10 MG tablet TAKE 1 TABLET(10 MG) BY MOUTH AT BEDTIME AS NEEDED FOR SLEEP (Patient taking differently: Take 10 mg by mouth at bedtime.) 30 tablet 2   No current facility-administered medications for this visit.    (Not in a hospital admission)   Family History  Problem Relation Age of Onset   Cancer Maternal Grandfather    Liver disease Sister    Alzheimer's disease Other    Memory loss Other    Hypertension Other      Review of Systems:   Review of Systems  Constitutional:  Positive for malaise/fatigue.  Respiratory:  Positive for shortness of breath.   Cardiovascular:  Positive for chest pain and palpitations.  Psychiatric/Behavioral:  The patient is nervous/anxious.      Physical Exam: BP 118/79   Pulse 97   Resp 20   Ht 5\' 4"  (1.626 m)   Wt 230 lb (104.3 kg)   SpO2 95% Comment: RA  BMI 39.48 kg/m  Physical Exam Constitutional:      Appearance: He is obese. He is diaphoretic.  Eyes:     Extraocular Movements: Extraocular movements intact.  Cardiovascular:     Rate and Rhythm: Normal rate.  Pulmonary:     Effort: Pulmonary effort is normal. No respiratory distress.   Musculoskeletal:        General: Normal range of motion.  Skin:    General: Skin is warm.  Neurological:     General: No focal deficit present.     Mental Status: He is alert and oriented to person, place, and time.      Diagnostic Studies & Laboratory data:  I have independently reviewed the above radiologic studies and discussed with the patient   Recent Lab Findings: Lab Results  Component Value Date   WBC 8.0 09/22/2021   HGB 17.3 (H) 09/26/2021   HCT 51.0 09/26/2021   PLT 199 09/22/2021   GLUCOSE 129 (H) 09/23/2021   CHOL 184 08/26/2021  TRIG 186 (H) 08/26/2021   HDL 42 08/26/2021   LDLCALC 109 (H) 08/26/2021   ALT 34 09/22/2021   AST 29 09/22/2021   NA 141 09/26/2021   K 4.3 09/26/2021   CL 100 09/23/2021   CREATININE 0.91 09/23/2021   BUN 14 09/23/2021   CO2 30 (H) 09/23/2021   INR 1.1 09/22/2021   HGBA1C 7.2 (H) 08/26/2021      Assessment / Plan:   55 year old male with bicuspid aortic valve, moderate aortic stenosis, and persistent atrial fibrillation.  He is still awaiting dental clearance.  He underwent a left heart cath which did not show any significant coronary disease and a CT chest which did not show any aneurysmal disease.  He remains short of breath, and is agreeable to proceed with surgery after undergoing his last 2 dental procedures.  He is tentatively scheduled for a mechanical aortic valve replacement along with Maze procedure and left atrial clip.  His Eliquis will need to be held for 3 days.     I  spent 40 minutes counseling the patient face to face.   Corliss Skains 10/03/2021 3:57 PM

## 2021-10-05 ENCOUNTER — Encounter: Payer: Self-pay | Admitting: *Deleted

## 2021-10-05 ENCOUNTER — Other Ambulatory Visit: Payer: Self-pay | Admitting: *Deleted

## 2021-10-05 DIAGNOSIS — I350 Nonrheumatic aortic (valve) stenosis: Secondary | ICD-10-CM

## 2021-10-05 DIAGNOSIS — I4819 Other persistent atrial fibrillation: Secondary | ICD-10-CM

## 2021-10-07 ENCOUNTER — Encounter (HOSPITAL_COMMUNITY): Payer: 59 | Admitting: Dentistry

## 2021-10-14 ENCOUNTER — Ambulatory Visit (INDEPENDENT_AMBULATORY_CARE_PROVIDER_SITE_OTHER): Payer: 59 | Admitting: Dentistry

## 2021-10-14 ENCOUNTER — Encounter (HOSPITAL_COMMUNITY): Payer: Self-pay | Admitting: Dentistry

## 2021-10-14 ENCOUNTER — Other Ambulatory Visit: Payer: Self-pay

## 2021-10-14 VITALS — BP 140/110 | HR 68 | Temp 97.8°F

## 2021-10-14 DIAGNOSIS — F458 Other somatoform disorders: Secondary | ICD-10-CM

## 2021-10-14 DIAGNOSIS — K030 Excessive attrition of teeth: Secondary | ICD-10-CM

## 2021-10-14 DIAGNOSIS — K036 Deposits [accretions] on teeth: Secondary | ICD-10-CM | POA: Diagnosis not present

## 2021-10-14 DIAGNOSIS — K0530 Chronic periodontitis, unspecified: Secondary | ICD-10-CM | POA: Diagnosis not present

## 2021-10-14 NOTE — Progress Notes (Signed)
Department of Dental Medicine                  ROUTINE VISIT   Service Date:   10/14/2021  Patient Name:   Marcus Hicks Date of Birth:   [birth year] Medical Record Number: [id number]   TODAY'S VISIT   Diagnosis Tooth #3 severe attrition/wear, loss of coronal tooth structure Accretions on teeth, inflamed gingival tissue Procedures: Restorative on #3 & full mouth debridement Plan: Follow-up as needed   10/14/2021 Progress Note:  COVID-19 SCREENING:  The patient denies symptoms concerning for COVID-19 infection including fever, chills, cough, or newly developed shortness of breath.   HISTORY OF PRESENT ILLNESS: Marcus Hicks is a 55 y.o. male who presents today for restorative treatment on tooth number 3 per treatment plan and debridement of remaining dentition.   CHIEF COMPLAINT:  Here for a routine dental appointment.  Patient with no complaints; he had tooth #19 extracted last week and his heart surgery is now scheduled for the end of this month.   Patient Active Problem List   Diagnosis Date Noted   Aortic stenosis 09/26/2021   Long term current use of anticoagulant therapy 09/22/2021   Encounter for preoperative dental examination 09/22/2021   Caries 09/22/2021   Teeth missing 09/22/2021   Impacted third molar tooth 09/22/2021   Hyperactive gag reflex 09/22/2021   Bruxism (teeth grinding) 09/22/2021   Clinical xerostomia 09/22/2021   Accretions on teeth 09/22/2021   Chronic periodontitis 09/22/2021   Excessive dental attrition 09/22/2021   Diastema 09/22/2021   Incipient enamel caries 09/22/2021   Phobia of dental procedure 09/22/2021   Secondary hypercoagulable state (HCC) 08/11/2021   Acute kidney injury (HCC) 06/01/2021   COVID-19 virus infection 06/01/2021   Hypotension 06/01/2021   Routine general medical examination at a health care facility 01/28/2021   BMI 38.0-38.9,adult 11/26/2020   Sleep apnea    Persistent atrial fibrillation  (HCC)    HFrEF (heart failure with reduced ejection fraction) (HCC) 08/22/2020   Atrial fibrillation with rapid ventricular response (HCC) 08/12/2020   Diabetes mellitus type 2, insulin dependent (HCC)    Hyperlipidemia 10/12/2016   Long-term insulin use (HCC) 10/12/2016   Morbid obesity (HCC) 10/12/2016   Type 2 diabetes mellitus with diabetic polyneuropathy (HCC) 10/12/2016   Obesity, unspecified 10/12/2016   Bicuspid aortic valve 07/03/2015   Dyslipidemia 07/03/2015   Essential hypertension 07/03/2015   COPD (chronic obstructive pulmonary disease) (HCC) 08/14/2013   Chronic hepatitis C virus infection (HCC) 08/03/2013   Past Medical History:  Diagnosis Date   Atrial fibrillation with rapid ventricular response (HCC) 08/12/2020   Bicuspid aortic valve    Chronic hepatitis C virus infection (HCC) 08/03/2013   COPD (chronic obstructive pulmonary disease) (HCC)    Diabetes mellitus type 2, insulin dependent (HCC)    Essential hypertension 07/03/2015   Last Assessment & Plan:  Blood pressure is borderline high.  Advised to get better sleep, walk on a regular basis, decrease weight down to about 210 and maintain, and eliminate tobacco.   HFrEF (heart failure with reduced ejection fraction) (HCC) 08/22/2020   Long-term insulin use (HCC) 10/12/2016   Obesity, unspecified 10/12/2016   Patient advised to lose down to about 210 pounds and maintained.  Regular walking program recommended.  He knows how to lose weight.   Sleep apnea    Past Surgical History:  Procedure Laterality Date   CARDIOVERSION N/A 09/24/2020   Procedure: CARDIOVERSION;  Surgeon: Bjorn Pippin,  Tanna Savoy, MD;  Location: [facility] ENDOSCOPY;  Service: Cardiovascular;  Laterality: N/A;   CARDIOVERSION N/A 08/13/2021   Procedure: CARDIOVERSION;  Surgeon: [name], MD;  Location: [facility] ENDOSCOPY;  Service: Cardiovascular;  Laterality: N/A;   CCTA  08/2019   RIGHT/LEFT HEART CATH AND CORONARY ANGIOGRAPHY N/A 09/26/2021    Procedure: RIGHT/LEFT HEART CATH AND CORONARY ANGIOGRAPHY;  Surgeon: Swaziland, Peter M, MD;  Location: [facility] INVASIVE CV LAB;  Service: Cardiovascular;  Laterality: N/A;   TEE WITHOUT CARDIOVERSION  09/24/2020   Procedure: TRANSESOPHAGEAL ECHOCARDIOGRAM (TEE);  Surgeon: [name], MD;  Location: [facility] ENDOSCOPY;  Service: Cardiovascular;;   No Known Allergies Current Outpatient Medications  Medication Sig Dispense Refill   albuterol (PROVENTIL) (2.5 MG/3ML) 0.083% nebulizer solution USE 1 VIAL VIA NEBULIZER EVERY 6 HOURS AS NEEDED FOR WHEEZING OR SHORTNESS OF BREATH (Patient taking differently: Take 2.5 mg by nebulization every 6 (six) hours as needed for wheezing.) 150 mL 3   ALPRAZolam (XANAX) 0.5 MG tablet Take 1 tablet (0.5 mg total) by mouth 3 (three) times daily as needed for anxiety. 90 tablet 3   BD PEN NEEDLE NANO 2ND GEN 32G X 4 MM MISC USE AS DIRECTED ONCE DAILY 100 each 4   ELIQUIS 5 MG TABS tablet TAKE 1 TABLET(5 MG) BY MOUTH TWICE DAILY 180 tablet 1   furosemide (LASIX) 20 MG tablet Take 1 tablet (20 mg total) by mouth daily. 30 tablet 11   glucose blood (CONTOUR NEXT TEST) test strip 1 each by Other route as needed for other. Use as instructed 100 each 4   metFORMIN (GLUCOPHAGE) 500 MG tablet TAKE 1 TABLET(500 MG) BY MOUTH TWICE DAILY WITH A MEAL 180 tablet 2   metoprolol succinate (TOPROL-XL) 100 MG 24 hr tablet Take 1 tablet (100 mg total) by mouth daily. Take with or immediately following a meal. 90 tablet 3   Microlet Lancets MISC USE TO CHECK BLOOD SUGAR EVERY MORNING AND AFTER MEALS     rosuvastatin (CRESTOR) 40 MG tablet Take 1 tablet (40 mg total) by mouth daily. 90 tablet 3   sacubitril-valsartan (ENTRESTO) 49-51 MG Take 1 tablet by mouth 2 (two) times daily. 180 tablet 3   traMADol (ULTRAM) 50 MG tablet Take 50 mg by mouth every 6 (six) hours as needed for severe pain.     TRESIBA FLEXTOUCH 100 UNIT/ML FlexTouch Pen ADMINISTER 62 UNITS UNDER THE SKIN DAILY 18 mL 6    umeclidinium-vilanterol (ANORO ELLIPTA) 62.5-25 MCG/INH AEPB Inhale 1 puff into the lungs every morning.     VICTOZA 18 MG/3ML SOPN ADMINISTER 1.2 MG UNDER THE SKIN DAILY (Patient taking differently: Inject 1.2 mg into the skin every morning.) 12 mL 6   zolpidem (AMBIEN) 10 MG tablet TAKE 1 TABLET(10 MG) BY MOUTH AT BEDTIME AS NEEDED FOR SLEEP (Patient taking differently: Take 10 mg by mouth at bedtime.) 30 tablet 2   No current facility-administered medications for this visit.    LABS: Lab Results  Component Value Date   WBC 8.0 09/22/2021   HGB 17.3 (H) 09/26/2021   HCT 51.0 09/26/2021   MCV 89.6 09/22/2021   PLT 199 09/22/2021      Component Value Date/Time   NA 141 09/26/2021 1159   NA 139 09/23/2021 1418   K 4.3 09/26/2021 1159   CL 100 09/23/2021 1418   CO2 30 (H) 09/23/2021 1418   GLUCOSE 129 (H) 09/23/2021 1418   GLUCOSE 113 (H) 08/14/2021 0330   BUN 14 09/23/2021 1418  CREATININE 0.91 09/23/2021 1418   CALCIUM 9.9 09/23/2021 1418   GFRNONAA >60 08/14/2021 0330   GFRAA 110 01/23/2021 1334   Lab Results  Component Value Date   INR 1.1 09/22/2021   No results found for: PTT  Social History   Socioeconomic History   Marital status: Widowed    Spouse name: Not on file   Number of children: Not on file   Years of education: Not on file   Highest education level: Not on file  Occupational History   Not on file  Tobacco Use   Smoking status: Former   Smokeless tobacco: Former    Types: Snuff   Tobacco comments:    Former snuff 08/11/2021  Vaping Use   Vaping Use: Never used  Substance and Sexual Activity   Alcohol use: Not Currently   Drug use: Never   Sexual activity: Not on file  Other Topics Concern   Not on file  Social History Narrative   Not on file   Social Determinants of Health   Financial Resource Strain: Not on file  Food Insecurity: Not on file  Transportation Needs: Not on file  Physical Activity: Not on file  Stress: Not on file   Social Connections: Not on file  Intimate Partner Violence: Not on file   Family History  Problem Relation Age of Onset   Cancer Maternal Grandfather    Liver disease Sister    Alzheimer's disease Other    Memory loss Other    Hypertension Other     ANTIBIOTIC PROPHYLAXIS/OTHER PREMEDICATION: None indicated.    VITAL SIGNS: BP (!) 140/110 (BP Location: Right Arm, Patient Position: Sitting, Cuff Size: Normal) Comment: New Blood Pressure taken at 1:31 PM is 127/81  Pulse 68   Temp 97.8 F (36.6 C) (Oral)    ASSESSMENT/INDICATION(S): #3 severe attrition/wear, compromised coronal tooth structure susceptible to decay; accretions on teeth   PROCEDURES: Restorative treatment on tooth #[id number]. Anesthesia: Topical:  Benzocaine 20% applied Type of anesthesia used:  34 mg lidocaine, 0.018 mg epinephrine Location:  #3 infiltration Aspiration negative. Fuji II restoration: Cotton roll isolation. Roughened enamel and dentinal surfaces with carbide bur and slow speed handpiece. Added retention grooves. Tooth #3MODBL prepared for composite. Placed cavity conditioner onto pulpal floor and etched for 15 seconds. Removed excess water with a brief burst of air.  Fuji II material was placed in increments and light-cured. Removed excess material with hand instruments and carbide finishing bur. Occlusion, margins and contact were verified and adjusted as needed; tooth #3 was taken completely out of occlusion. Restoration finished and polished. The patient was advised of possible normal sensitivity to hot and cold for the next few days/weeks.  Full mouth debridement. Removed all supra-gingival calculus and plaque using Cavitron and hand instruments. Polished all teeth.  Flossed. Oral hygiene instruction discussed with the patient. Toothbrush, toothpaste and floss given to the patient.    PLAN:  Follow-up as needed in the hospital dental clinic  All questions and concerns were invited and  addressed.  The patient tolerated today's visit well and departed in stable condition.     Shelah Heatley B. Chales Salmon, D.M.D.

## 2021-10-15 ENCOUNTER — Ambulatory Visit (INDEPENDENT_AMBULATORY_CARE_PROVIDER_SITE_OTHER): Payer: 59 | Admitting: Cardiology

## 2021-10-15 ENCOUNTER — Encounter: Payer: Self-pay | Admitting: Cardiology

## 2021-10-15 VITALS — BP 138/80 | HR 78 | Ht 65.0 in | Wt 229.2 lb

## 2021-10-15 DIAGNOSIS — I350 Nonrheumatic aortic (valve) stenosis: Secondary | ICD-10-CM

## 2021-10-15 DIAGNOSIS — I4819 Other persistent atrial fibrillation: Secondary | ICD-10-CM | POA: Diagnosis not present

## 2021-10-15 DIAGNOSIS — I5020 Unspecified systolic (congestive) heart failure: Secondary | ICD-10-CM

## 2021-10-15 DIAGNOSIS — K7460 Unspecified cirrhosis of liver: Secondary | ICD-10-CM

## 2021-10-15 DIAGNOSIS — Z7901 Long term (current) use of anticoagulants: Secondary | ICD-10-CM

## 2021-10-15 DIAGNOSIS — I2510 Atherosclerotic heart disease of native coronary artery without angina pectoris: Secondary | ICD-10-CM | POA: Diagnosis not present

## 2021-10-15 NOTE — Patient Instructions (Signed)
Medication Instructions:  Your physician recommends that you continue on your current medications as directed. Please refer to the Current Medication list given to you today.  *If you need a refill on your cardiac medications before your next appointment, please call your pharmacy*   Lab Work: NONE If you have labs (blood work) drawn today and your tests are completely normal, you will receive your results only by: MyChart Message (if you have MyChart) OR A paper copy in the mail If you have any lab test that is abnormal or we need to change your treatment, we will call you to review the results.   Testing/Procedures: NONE   Follow-Up: At [facility], you and your health needs are our priority.  As part of our continuing mission to provide you with exceptional heart care, we have created designated Provider Care Teams.  These Care Teams include your primary Cardiologist (physician) and Advanced Practice Providers (APPs -  Physician Assistants and Nurse Practitioners) who all work together to provide you with the care you need, when you need it.  We recommend signing up for the patient portal called "MyChart".  Sign up information is provided on this After Visit Summary.  MyChart is used to connect with patients for Virtual Visits (Telemedicine).  Patients are able to view lab/test results, encounter notes, upcoming appointments, etc.  Non-urgent messages can be sent to your provider as well.   To learn more about what you can do with MyChart, go to [email or web address].    Pt is to follow up with Dr. Dulce Sellar after his open heart surgery:1}    Other Instructions

## 2021-10-15 NOTE — Progress Notes (Signed)
Cardiology Office Note:    Date:  10/15/2021   ID:  Marcus Hicks, DOB 09/26/1966, MRN [id number]  PCP:  Abigail Miyamoto, MD  Cardiologist:  Norman Herrlich, MD    Referring MD: Abigail Miyamoto,*    ASSESSMENT:    1. Aortic valve stenosis, etiology of cardiac valve disease unspecified   2. Coronary artery disease involving native coronary artery of native heart without angina pectoris   3. Persistent atrial fibrillation (HCC)   4. HFrEF (heart failure with reduced ejection fraction) (HCC)   5. Cirrhosis of liver without ascites, unspecified hepatic cirrhosis type (HCC)   6. Chronic anticoagulation    PLAN:    In order of problems listed above:  Reviewed extensive records from multiple cardiology physicians at [facility] and agree with plan for aortic valve replacement.  Hopefully can have left atrial occlusion performed intraoperatively and we can look at long-term anticoagulation with his liver disease his heart failure presently is nicely compensated on his current loop diuretic he is on appropriate therapy with reduced ejection fraction including beta-blocker Entresto and lipid-lowering therapy with a statin with his CAD.   Next appointment: To be determined postoperatively   Medication Adjustments/Labs and Tests Ordered: Current medicines are reviewed at length with the patient today.  Concerns regarding medicines are outlined above.  No orders of the defined types were placed in this encounter.  No orders of the defined types were placed in this encounter.  Chief complaint: I wanted to see you and get your opinion prior to having valve replacement  History of Present Illness:    Marcus Hicks is a 55 y.o. male with a hx of persistent atrial fibrillation rate controlled with a beta-blocker and anticoagulated with Eliquis heart failure reduced ejection fraction mild CAD hyperlipidemia hepatic cirrhosis thoracic aortic aneurysm and a bicuspid  aortic valve with AS  last seen 04/18/2021 and referred for consideration Watchman device evaluation and trial of Tikosyn to maintain sinus rhythm  He is scheduled for aortic valve replacement in 1 month and wants my opinion.  I reviewed his records and agree with management and decision-making.  Terms of managing atrial fibrillation and anticoagulation with his cirrhosis I expect he will have a left atrial occlusion performed and perhaps not require long-term anticoagulation.  He has had his dental procedures performed and actually is feeling better he is not having shortness of breath edema chest pain palpitation or syncope..  Compliance with diet, lifestyle and medications: Yes  09/26/2021 Procedures RIGHT/LEFT HEART CATH AND CORONARY ANGIOGRAPHY   Conclusion   Prox LAD to Mid LAD lesion is 35% stenosed.   LV end diastolic pressure is moderately elevated.   Hemodynamic findings consistent with moderate pulmonary hypertension.   Nonobstructive CAD Aortic stenosis. Gradient cannot be accurately measured by pullback given Afib but there is an AV gradient. Moderately elevated LV filling pressures Moderate pulmonary HTN Cardiac index 1.9  Past Medical History:  Diagnosis Date   Atrial fibrillation with rapid ventricular response (HCC) 08/12/2020   Bicuspid aortic valve    Chronic hepatitis C virus infection (HCC) 08/03/2013   COPD (chronic obstructive pulmonary disease) (HCC)    Diabetes mellitus type 2, insulin dependent (HCC)    Essential hypertension 07/03/2015   Last Assessment & Plan:  Blood pressure is borderline high.  Advised to get better sleep, walk on a regular basis, decrease weight down to about 210 and maintain, and eliminate tobacco.   HFrEF (heart failure with reduced ejection fraction) (  HCC) 08/22/2020   Long-term insulin use (HCC) 10/12/2016   Obesity, unspecified 10/12/2016   Patient advised to lose down to about 210 pounds and maintained.  Regular walking program  recommended.  He knows how to lose weight.   Sleep apnea     Past Surgical History:  Procedure Laterality Date   CARDIOVERSION N/A 09/24/2020   Procedure: CARDIOVERSION;  Surgeon: [name], MD;  Location[facility] ENDOSCOPY;  Service: Cardiovascular;  Laterality: N/A;   CARDIOVERSION N/A 08/13/2021   Procedure: CARDIOVERSION;  Surgeon: [name], MD;  Location: [facility] ENDOSCOPY;  Service: Cardiovascular;  Laterality: N/A;   CCTA  08/2019   RIGHT/LEFT HEART CATH AND CORONARY ANGIOGRAPHY N/A 09/26/2021   Procedure: RIGHT/LEFT HEART CATH AND CORONARY ANGIOGRAPHY;  Surgeon: Swaziland, Peter M, MD;  Location: [facility] INVASIVE CV LAB;  Service: Cardiovascular;  Laterality: N/A;   TEE WITHOUT CARDIOVERSION  09/24/2020   Procedure: TRANSESOPHAGEAL ECHOCARDIOGRAM (TEE);  Surgeon: [name], MD;  Location: [facility] ENDOSCOPY;  Service: Cardiovascular;;    Current Medications: Current Meds  Medication Sig   albuterol (PROVENTIL) (2.5 MG/3ML) 0.083% nebulizer solution USE 1 VIAL VIA NEBULIZER EVERY 6 HOURS AS NEEDED FOR WHEEZING OR SHORTNESS OF BREATH (Patient taking differently: Take 2.5 mg by nebulization every 6 (six) hours as needed for wheezing.)   ALPRAZolam (XANAX) 0.5 MG tablet Take 1 tablet (0.5 mg total) by mouth 3 (three) times daily as needed for anxiety.   ELIQUIS 5 MG TABS tablet TAKE 1 TABLET(5 MG) BY MOUTH TWICE DAILY (Patient taking differently: Take 5 mg by mouth 2 (two) times daily. TAKE 1 TABLET(5 MG) BY MOUTH TWICE DAILY)   furosemide (LASIX) 20 MG tablet Take 1 tablet (20 mg total) by mouth daily.   metFORMIN (GLUCOPHAGE) 500 MG tablet TAKE 1 TABLET(500 MG) BY MOUTH TWICE DAILY WITH A MEAL (Patient taking differently: 500 mg 2 (two) times daily with a meal. TAKE 1 TABLET(500 MG) BY MOUTH TWICE DAILY WITH A MEAL)   metoprolol succinate (TOPROL-XL) 100 MG 24 hr tablet Take 1 tablet (100 mg total) by mouth daily. Take with or immediately following a meal.   rosuvastatin  (CRESTOR) 40 MG tablet Take 1 tablet (40 mg total) by mouth daily.   sacubitril-valsartan (ENTRESTO) 49-51 MG Take 1 tablet by mouth 2 (two) times daily.   traMADol (ULTRAM) 50 MG tablet Take 50 mg by mouth every 6 (six) hours as needed for severe pain.   TRESIBA FLEXTOUCH 100 UNIT/ML FlexTouch Pen ADMINISTER 62 UNITS UNDER THE SKIN DAILY (Patient taking differently: Inject 62 Units into the skin daily.)   umeclidinium-vilanterol (ANORO ELLIPTA) 62.5-25 MCG/INH AEPB Inhale 1 puff into the lungs every morning.   VICTOZA 18 MG/3ML SOPN ADMINISTER 1.2 MG UNDER THE SKIN DAILY (Patient taking differently: Inject 1.2 mg into the skin every morning.)   zolpidem (AMBIEN) 10 MG tablet TAKE 1 TABLET(10 MG) BY MOUTH AT BEDTIME AS NEEDED FOR SLEEP (Patient taking differently: Take 10 mg by mouth at bedtime.)     Allergies:   Patient has no known allergies.   Social History   Socioeconomic History   Marital status: Widowed    Spouse name: Not on file   Number of children: Not on file   Years of education: Not on file   Highest education level: Not on file  Occupational History   Not on file  Tobacco Use   Smoking status: Former   Smokeless tobacco: Former    Types: Snuff   Tobacco comments:  Former snuff 08/11/2021  Vaping Use   Vaping Use: Never used  Substance and Sexual Activity   Alcohol use: Not Currently   Drug use: Never   Sexual activity: Not on file  Other Topics Concern   Not on file  Social History Narrative   Not on file   Social Determinants of Health   Financial Resource Strain: Not on file  Food Insecurity: Not on file  Transportation Needs: Not on file  Physical Activity: Not on file  Stress: Not on file  Social Connections: Not on file     Family History: The patient's family history includes Alzheimer's disease in an other family member; Cancer in his maternal grandfather; Hypertension in an other family member; Liver disease in his sister; Memory loss in an  other family member. ROS:   Please see the history of present illness.    All other systems reviewed and are negative.  EKGs/Labs/Other Studies Reviewed:    The following studies were reviewed today:  EKG:  EKG last performed [facility] 09/26/2021 personally reviewed shows rate controlled atrial fibrillation  Recent Labs: 06/01/2021: B Natriuretic Peptide 85.2 08/14/2021: Magnesium 1.8 08/26/2021: NT-Pro BNP 1,398 09/22/2021: ALT 34; Platelets 199 09/23/2021: BUN 14; Creatinine, Ser 0.91 09/26/2021: Hemoglobin 17.3; Potassium 4.3; Sodium 141  Recent Lipid Panel    Component Value Date/Time   CHOL 184 08/26/2021 1534   TRIG 186 (H) 08/26/2021 1534   HDL 42 08/26/2021 1534   CHOLHDL 4.4 08/26/2021 1534   LDLCALC 109 (H) 08/26/2021 1534    Physical Exam:    VS:  BP 138/80 (BP Location: Right Arm, Patient Position: Sitting)   Pulse 78   Ht 5\' 5"  (1.651 m)   Wt 229 lb 3.2 oz (104 kg)   SpO2 92%   BMI 38.14 kg/m     Wt Readings from Last 3 Encounters:  10/15/21 229 lb 3.2 oz (104 kg)  10/03/21 230 lb (104.3 kg)  09/26/21 230 lb (104.3 kg)     GEN: Looks healthier than any other time I seen him in the past well nourished, well developed in no acute distress HEENT: Normal NECK: No JVD; No carotid bruits LYMPHATICS: No lymphadenopathy CARDIAC: Irregular rhythm murmur of aortic stenosis RRR, no  rubs, gallops RESPIRATORY:  Clear to auscultation without rales, wheezing or rhonchi  ABDOMEN: Soft, non-tender, non-distended MUSCULOSKELETAL:  No edema; No deformity  SKIN: Warm and dry NEUROLOGIC:  Alert and oriented x 3 PSYCHIATRIC:  Normal affect    Signed, 09/28/21, MD  10/15/2021 10:18 AM    [facility]

## 2021-10-16 ENCOUNTER — Other Ambulatory Visit: Payer: Self-pay | Admitting: Cardiology

## 2021-10-22 NOTE — Pre-Procedure Instructions (Signed)
Surgical Instructions    Your procedure is scheduled on Tuesday, October 28, 2021 at 7:30 AM.  Report to [facility] Main Entrance "A" at 5:30 A.M., then check in with the Admitting office.  Call this number if you have problems the morning of surgery:  [phone]   If you have any questions prior to your surgery date call [phone]: Open Monday-Friday 8am-4pm    Remember:  Do not eat or drink after midnight the night before your surgery     Take these medicines the morning of surgery with A SIP OF WATER:  metoprolol succinate (TOPROL-XL) rosuvastatin (CRESTOR) umeclidinium-vilanterol (ANORO ELLIPTA)  IF NEEDED: albuterol (PROVENTIL)  ALPRAZolam (XANAX) traMADol (ULTRAM)  Please bring all inhalers with you the day of surgery.   Follow your surgeon's instructions on when to stop ELIQUIS.  If no instructions were given by your surgeon then you will need to call the office to get those instructions.     As of today, STOP taking any Aspirin (unless otherwise instructed by your surgeon) Aleve, Naproxen, Ibuprofen, Motrin, Advil, Goody's, BC's, all herbal medications, fish oil, and all vitamins.  WHAT DO I DO ABOUT MY DIABETES MEDICATION?   Do not take metFORMIN (GLUCOPHAGE) the morning of surgery.  THE NIGHT BEFORE SURGERY, take 31 units of TRESIBA FLEXTOUCH.       THE MORNING OF SURGERY, take 31 units of TRESIBA FLEXTOUCH.  The day of surgery, do not take other diabetes injectables, including Victoza (liraglutide).   HOW TO MANAGE YOUR DIABETES BEFORE AND AFTER SURGERY  Why is it important to control my blood sugar before and after surgery? Improving blood sugar levels before and after surgery helps healing and can limit problems. A way of improving blood sugar control is eating a healthy diet by:  Eating less sugar and carbohydrates  Increasing activity/exercise  Talking with your doctor about reaching your blood sugar goals High blood sugars (greater than 180  mg/dL) can raise your risk of infections and slow your recovery, so you will need to focus on controlling your diabetes during the weeks before surgery. Make sure that the doctor who takes care of your diabetes knows about your planned surgery including the date and location.  How do I manage my blood sugar before surgery? Check your blood sugar at least 4 times a day, starting 2 days before surgery, to make sure that the level is not too high or low.  Check your blood sugar the morning of your surgery when you wake up and every 2 hours until you get to the Short Stay unit.  If your blood sugar is less than 70 mg/dL, you will need to treat for low blood sugar: Do not take insulin. Treat a low blood sugar (less than 70 mg/dL) with  cup of clear juice (cranberry or apple), 4 glucose tablets, OR glucose gel. Recheck blood sugar in 15 minutes after treatment (to make sure it is greater than 70 mg/dL). If your blood sugar is not greater than 70 mg/dL on recheck, call [phone] for further instructions. Report your blood sugar to the short stay nurse when you get to Short Stay.  If you are admitted to the hospital after surgery: Your blood sugar will be checked by the staff and you will probably be given insulin after surgery (instead of oral diabetes medicines) to make sure you have good blood sugar levels. The goal for blood sugar control after surgery is 80-180 mg/dL.  Do NOT Smoke (Tobacco/Vaping) or drink Alcohol 24 hours prior to your procedure.  If you use a CPAP at night, you may bring all equipment for your overnight stay.   Contacts, glasses, piercing's, hearing aid's, dentures or partials may not be worn into surgery, please bring cases for these belongings.    For patients admitted to the hospital, discharge time will be determined by your treatment team.   Patients discharged the day of surgery will not be allowed to drive home, and someone needs to stay  with them for 24 hours.  NO VISITORS WILL BE ALLOWED IN PRE-OP WHERE PATIENTS GET READY FOR SURGERY.  ONLY 1 SUPPORT PERSON MAY BE PRESENT IN THE WAITING ROOM WHILE YOU ARE IN SURGERY.  IF YOU ARE TO BE ADMITTED, ONCE YOU ARE IN YOUR ROOM YOU WILL BE ALLOWED TWO (2) VISITORS.  Minor children may have two parents present. Special consideration for safety and communication needs will be reviewed on a case by case basis.   Special instructions:   [facility]- Preparing For Surgery  Before surgery, you can play an important role. Because skin is not sterile, your skin needs to be as free of germs as possible. You can reduce the number of germs on your skin by washing with CHG (chlorahexidine gluconate) Soap before surgery.  CHG is an antiseptic cleaner which kills germs and bonds with the skin to continue killing germs even after washing.    Oral Hygiene is also important to reduce your risk of infection.  Remember - BRUSH YOUR TEETH THE MORNING OF SURGERY WITH YOUR REGULAR TOOTHPASTE  Please do not use if you have an allergy to CHG or antibacterial soaps. If your skin becomes reddened/irritated stop using the CHG.  Do not shave (including legs and underarms) for at least 48 hours prior to first CHG shower. It is OK to shave your face.  Please follow these instructions carefully.   Shower the NIGHT BEFORE SURGERY and the MORNING OF SURGERY  If you chose to wash your hair, wash your hair first as usual with your normal shampoo.  After you shampoo, rinse your hair and body thoroughly to remove the shampoo.  Use CHG Soap as you would any other liquid soap. You can apply CHG directly to the skin and wash gently with a scrungie or a clean washcloth.   Apply the CHG Soap to your body ONLY FROM THE NECK DOWN.  Do not use on open wounds or open sores. Avoid contact with your eyes, ears, mouth and genitals (private parts). Wash Face and genitals (private parts)  with your normal soap.   Wash  thoroughly, paying special attention to the area where your surgery will be performed.  Thoroughly rinse your body with warm water from the neck down.  DO NOT shower/wash with your normal soap after using and rinsing off the CHG Soap.  Pat yourself dry with a CLEAN TOWEL.  Wear CLEAN PAJAMAS to bed the night before surgery  Place CLEAN SHEETS on your bed the night before your surgery  DO NOT SLEEP WITH PETS.   Day of Surgery: Shower with CHG soap. Do not wear jewelry. Do not wear lotions, powders, colognes, or deodorant. Men may shave face and neck. Do not bring valuables to the hospital. [facility] is not responsible for any belongings or valuables. Wear Clean/Comfortable clothing the morning of surgery Remember to brush your teeth WITH YOUR REGULAR TOOTHPASTE.   Please read over the following fact sheets that  you were given.   3 days prior to your procedure or After your COVID test   You are not required to quarantine however you are required to wear a well-fitting mask when you are out and around people not in your household. If your mask becomes wet or soiled, replace with a new one.   Wash your hands often with soap and water for 20 seconds or clean your hands with an alcohol-based hand sanitizer that contains at least 60% alcohol.   Do not share personal items.   Notify your provider:  o if you are in close contact with someone who has COVID  o or if you develop a fever of 100.4 or greater, sneezing, cough, sore throat, shortness of breath or body aches.

## 2021-10-27 ENCOUNTER — Ambulatory Visit (HOSPITAL_BASED_OUTPATIENT_CLINIC_OR_DEPARTMENT_OTHER)
Admission: RE | Admit: 2021-10-27 | Discharge: 2021-10-27 | Disposition: A | Discharge status: Home / Self Care | Payer: 59 | Source: Ambulatory Visit | Attending: Thoracic Surgery (Cardiothoracic Vascular Surgery) | Admitting: Thoracic Surgery (Cardiothoracic Vascular Surgery)

## 2021-10-27 ENCOUNTER — Encounter (HOSPITAL_COMMUNITY): Payer: Self-pay

## 2021-10-27 ENCOUNTER — Encounter (HOSPITAL_COMMUNITY)
Admission: RE | Admit: 2021-10-27 | Discharge: 2021-10-27 | Disposition: A | Discharge status: Home / Self Care | Payer: 59 | Source: Ambulatory Visit | Attending: Thoracic Surgery (Cardiothoracic Vascular Surgery) | Admitting: Thoracic Surgery (Cardiothoracic Vascular Surgery)

## 2021-10-27 ENCOUNTER — Other Ambulatory Visit (HOSPITAL_COMMUNITY): Payer: 59

## 2021-10-27 ENCOUNTER — Other Ambulatory Visit: Payer: Self-pay

## 2021-10-27 ENCOUNTER — Ambulatory Visit (HOSPITAL_COMMUNITY)
Admission: RE | Admit: 2021-10-27 | Discharge: 2021-10-27 | Disposition: A | Discharge status: Home / Self Care | Payer: 59 | Source: Ambulatory Visit | Attending: Thoracic Surgery (Cardiothoracic Vascular Surgery) | Admitting: Thoracic Surgery (Cardiothoracic Vascular Surgery)

## 2021-10-27 VITALS — BP 113/83 | HR 90 | Temp 98.0°F | Resp 17 | Ht 65.0 in | Wt 224.4 lb

## 2021-10-27 DIAGNOSIS — I2510 Atherosclerotic heart disease of native coronary artery without angina pectoris: Secondary | ICD-10-CM | POA: Insufficient documentation

## 2021-10-27 DIAGNOSIS — J449 Chronic obstructive pulmonary disease, unspecified: Secondary | ICD-10-CM | POA: Insufficient documentation

## 2021-10-27 DIAGNOSIS — B182 Chronic viral hepatitis C: Secondary | ICD-10-CM | POA: Insufficient documentation

## 2021-10-27 DIAGNOSIS — Z87891 Personal history of nicotine dependence: Secondary | ICD-10-CM | POA: Insufficient documentation

## 2021-10-27 DIAGNOSIS — I110 Hypertensive heart disease with heart failure: Secondary | ICD-10-CM | POA: Insufficient documentation

## 2021-10-27 DIAGNOSIS — I350 Nonrheumatic aortic (valve) stenosis: Secondary | ICD-10-CM | POA: Diagnosis not present

## 2021-10-27 DIAGNOSIS — E669 Obesity, unspecified: Secondary | ICD-10-CM | POA: Insufficient documentation

## 2021-10-27 DIAGNOSIS — Z01818 Encounter for other preprocedural examination: Secondary | ICD-10-CM

## 2021-10-27 DIAGNOSIS — I4819 Other persistent atrial fibrillation: Secondary | ICD-10-CM

## 2021-10-27 DIAGNOSIS — K7460 Unspecified cirrhosis of liver: Secondary | ICD-10-CM | POA: Insufficient documentation

## 2021-10-27 DIAGNOSIS — E785 Hyperlipidemia, unspecified: Secondary | ICD-10-CM | POA: Insufficient documentation

## 2021-10-27 DIAGNOSIS — Z20822 Contact with and (suspected) exposure to covid-19: Secondary | ICD-10-CM | POA: Insufficient documentation

## 2021-10-27 DIAGNOSIS — E119 Type 2 diabetes mellitus without complications: Secondary | ICD-10-CM | POA: Insufficient documentation

## 2021-10-27 DIAGNOSIS — I5022 Chronic systolic (congestive) heart failure: Secondary | ICD-10-CM | POA: Insufficient documentation

## 2021-10-27 DIAGNOSIS — Z6837 Body mass index (BMI) 37.0-37.9, adult: Secondary | ICD-10-CM | POA: Insufficient documentation

## 2021-10-27 HISTORY — DX: Cardiac arrhythmia, unspecified: I49.9

## 2021-10-27 HISTORY — DX: Heart failure, unspecified: I50.9

## 2021-10-27 HISTORY — DX: Atherosclerotic heart disease of native coronary artery without angina pectoris: I25.10

## 2021-10-27 LAB — TYPE AND SCREEN
ABO/RH(D): A POS
Antibody Screen: NEGATIVE

## 2021-10-27 LAB — URINALYSIS, ROUTINE W REFLEX MICROSCOPIC
Bilirubin Urine: NEGATIVE
Glucose, UA: NEGATIVE mg/dL
Ketones, ur: NEGATIVE mg/dL
Leukocytes,Ua: NEGATIVE
Nitrite: NEGATIVE
Protein, ur: 100 mg/dL — AB
Specific Gravity, Urine: 1.012 (ref 1.005–1.030)
pH: 5 (ref 5.0–8.0)

## 2021-10-27 LAB — COMPREHENSIVE METABOLIC PANEL
ALT: 49 U/L — ABNORMAL HIGH (ref 0–44)
AST: 41 U/L (ref 15–41)
Albumin: 4 g/dL (ref 3.5–5.0)
Alkaline Phosphatase: 49 U/L (ref 38–126)
Anion gap: 10 (ref 5–15)
BUN: 21 mg/dL — ABNORMAL HIGH (ref 6–20)
CO2: 24 mmol/L (ref 22–32)
Calcium: 9.8 mg/dL (ref 8.9–10.3)
Chloride: 103 mmol/L (ref 98–111)
Creatinine, Ser: 1.01 mg/dL (ref 0.61–1.24)
GFR, Estimated: >60 mL/min (ref >60)
Glucose, Bld: 157 mg/dL — ABNORMAL HIGH (ref 70–99)
Potassium: 4.6 mmol/L (ref 3.5–5.1)
Sodium: 137 mmol/L (ref 135–145)
Total Bilirubin: 0.7 mg/dL (ref 0.3–1.2)
Total Protein: 7.4 g/dL (ref 6.5–8.1)

## 2021-10-27 LAB — SARS CORONAVIRUS 2 (TAT 6-24 HRS): SARS Coronavirus 2: NEGATIVE

## 2021-10-27 LAB — BLOOD GAS, ARTERIAL
Acid-Base Excess: 2.5 mmol/L — ABNORMAL HIGH (ref 0.0–2.0)
Bicarbonate: 26.6 mmol/L (ref 20.0–28.0)
FIO2: 21
O2 Saturation: 97.3 %
Patient temperature: 37
pCO2 arterial: 41.5 mmHg (ref 32.0–48.0)
pH, Arterial: 7.422 (ref 7.350–7.450)
pO2, Arterial: 101 mmHg (ref 83.0–108.0)

## 2021-10-27 LAB — CBC
HCT: 53.5 % — ABNORMAL HIGH (ref 39.0–52.0)
Hemoglobin: 18.1 g/dL — ABNORMAL HIGH (ref 13.0–17.0)
MCH: 29.4 pg (ref 26.0–34.0)
MCHC: 33.8 g/dL (ref 30.0–36.0)
MCV: 86.9 fL (ref 80.0–100.0)
Platelets: 234 10*3/uL (ref 150–400)
RBC: 6.16 MIL/uL — ABNORMAL HIGH (ref 4.22–5.81)
RDW: 12.1 % (ref 11.5–15.5)
WBC: 8.4 10*3/uL (ref 4.0–10.5)
nRBC: 0 % (ref 0.0–0.2)

## 2021-10-27 LAB — APTT: aPTT: 30 seconds (ref 24–36)

## 2021-10-27 LAB — GLUCOSE, CAPILLARY: Glucose-Capillary: 138 mg/dL — ABNORMAL HIGH (ref 70–99)

## 2021-10-27 LAB — PROTIME-INR
INR: 1.1 (ref 0.8–1.2)
Prothrombin Time: 13.9 seconds (ref 11.4–15.2)

## 2021-10-27 LAB — SURGICAL PCR SCREEN
MRSA, PCR: NEGATIVE
Staphylococcus aureus: NEGATIVE

## 2021-10-27 MED ORDER — CEFAZOLIN SODIUM-DEXTROSE 2-4 GM/100ML-% IV SOLN
2.0000 g | INTRAVENOUS | Status: AC
  Administered 2021-10-28: 2 g via INTRAVENOUS
  Filled 2021-10-27: qty 100

## 2021-10-27 MED ORDER — PLASMA-LYTE A IV SOLN
INTRAVENOUS | Status: DC
  Filled 2021-10-27: qty 5

## 2021-10-27 MED ORDER — TRANEXAMIC ACID 1000 MG/10ML IV SOLN
1.5000 mg/kg/h | INTRAVENOUS | Status: AC
  Administered 2021-10-28: 1.5 mg/kg/h via INTRAVENOUS
  Filled 2021-10-27: qty 25

## 2021-10-27 MED ORDER — TRANEXAMIC ACID (OHS) BOLUS VIA INFUSION
15.0000 mg/kg | INTRAVENOUS | Status: AC
  Administered 2021-10-28: 1527 mg via INTRAVENOUS
  Filled 2021-10-27: qty 1527

## 2021-10-27 MED ORDER — INSULIN REGULAR(HUMAN) IN NACL 100-0.9 UT/100ML-% IV SOLN
INTRAVENOUS | Status: AC
  Administered 2021-10-28: 4.8 [IU]/h via INTRAVENOUS
  Filled 2021-10-27: qty 100

## 2021-10-27 MED ORDER — MAGNESIUM SULFATE 50 % IJ SOLN
40.0000 meq | INTRAMUSCULAR | Status: DC
  Filled 2021-10-27: qty 9.85

## 2021-10-27 MED ORDER — VANCOMYCIN HCL 1500 MG/300ML IV SOLN
1500.0000 mg | INTRAVENOUS | Status: AC
  Administered 2021-10-28: 1500 mg via INTRAVENOUS
  Filled 2021-10-27: qty 300

## 2021-10-27 MED ORDER — NITROGLYCERIN IN D5W 200-5 MCG/ML-% IV SOLN
2.0000 ug/min | INTRAVENOUS | Status: AC
  Administered 2021-10-28: 20 ug/min via INTRAVENOUS
  Filled 2021-10-27: qty 250

## 2021-10-27 MED ORDER — EPINEPHRINE HCL 5 MG/250ML IV SOLN IN NS
0.0000 ug/min | INTRAVENOUS | Status: DC
  Filled 2021-10-27: qty 250

## 2021-10-27 MED ORDER — POTASSIUM CHLORIDE 2 MEQ/ML IV SOLN
80.0000 meq | INTRAVENOUS | Status: DC
  Filled 2021-10-27: qty 40

## 2021-10-27 MED ORDER — MILRINONE LACTATE IN DEXTROSE 20-5 MG/100ML-% IV SOLN
0.3000 ug/kg/min | INTRAVENOUS | Status: AC
  Administered 2021-10-28: .375 ug/kg/min via INTRAVENOUS
  Filled 2021-10-27: qty 100

## 2021-10-27 MED ORDER — TRANEXAMIC ACID (OHS) PUMP PRIME SOLUTION
2.0000 mg/kg | INTRAVENOUS | Status: DC
  Filled 2021-10-27: qty 2.04

## 2021-10-27 MED ORDER — MANNITOL 20 % IV SOLN
INTRAVENOUS | Status: DC
  Filled 2021-10-27: qty 13

## 2021-10-27 MED ORDER — PHENYLEPHRINE HCL-NACL 20-0.9 MG/250ML-% IV SOLN
30.0000 ug/min | INTRAVENOUS | Status: AC
  Administered 2021-10-28: 30 ug/min via INTRAVENOUS
  Filled 2021-10-27: qty 250

## 2021-10-27 MED ORDER — HEPARIN 30,000 UNITS/1000 ML (OHS) CELLSAVER SOLUTION
Status: DC
  Filled 2021-10-27: qty 1000

## 2021-10-27 MED ORDER — DEXMEDETOMIDINE HCL IN NACL 400 MCG/100ML IV SOLN
0.1000 ug/kg/h | INTRAVENOUS | Status: AC
  Administered 2021-10-28: .5 ug/kg/h via INTRAVENOUS
  Administered 2021-10-28: .7 ug/kg/h via INTRAVENOUS
  Filled 2021-10-27: qty 100

## 2021-10-27 MED ORDER — NOREPINEPHRINE 4 MG/250ML-% IV SOLN
0.0000 ug/min | INTRAVENOUS | Status: AC
  Administered 2021-10-28: 2 ug/min via INTRAVENOUS
  Filled 2021-10-27: qty 250

## 2021-10-27 NOTE — Progress Notes (Signed)
Abnormal labs in PAT: Hgb 18.1; HCT 53.5; Urinalysis: Bacteria, UA - rare; Protein, ur - 100; Hgb urine - small. Dr. Cliffton Asters office was notified Alycia Rossetti, [place], [place]).

## 2021-10-27 NOTE — Anesthesia Preprocedure Evaluation (Addendum)
Anesthesia Evaluation  Patient identified by MRN, date of birth, ID band Patient awake    Reviewed: Allergy & Precautions, NPO status , Patient's Chart, lab work & pertinent test results  History of Anesthesia Complications Negative for: history of anesthetic complications  Airway Mallampati: III  TM Distance: >3 FB Neck ROM: Full    Dental  (+) Dental Advisory Given, Teeth Intact   Pulmonary sleep apnea , COPD, former smoker,    breath sounds clear to auscultation       Cardiovascular hypertension, Pt. on medications and Pt. on home beta blockers + CAD and +CHF  + dysrhythmias + Valvular Problems/Murmurs AS  Rhythm:Irregular + Systolic murmurs    Neuro/Psych PSYCHIATRIC DISORDERS Anxiety  Neuromuscular disease    GI/Hepatic negative GI ROS, (+) Hepatitis -, CS/p treatment  Lab Results      Component                Value               Date                      ALT                      49 (H)              10/27/2021                AST                      41                  10/27/2021                ALKPHOS                  49                  10/27/2021                BILITOT                  0.7                 10/27/2021              Endo/Other  diabetes, Type 2  Renal/GU negative Renal ROSLab Results      Component                Value               Date                      CREATININE               0.70                10/28/2021                Musculoskeletal   Abdominal   Peds  Hematology  (+) Blood dyscrasia, anemia , Lab Results      Component                Value               Date  WBC                      8.4                 10/27/2021                HGB                      12.9 (L)            10/28/2021                HCT                      38.0 (L)            10/28/2021                MCV                      86.9                10/27/2021                PLT                       234                 10/27/2021            eliquis   Anesthesia Other Findings 1. Left ventricular ejection fraction, by estimation, is 45 to 50%. The  left ventricle has mildly decreased function. Left ventricular diastolic  parameters are indeterminate.  2. Right ventricular systolic function is normal. The right ventricular  size is normal. There is normal pulmonary artery systolic pressure.  3. Left atrial size was mildly dilated.  4. Right atrial size was mildly dilated.  5. The mitral valve is normal in structure. Trivial mitral valve  regurgitation. No evidence of mitral stenosis.  6. Aortic valve regurgitation is not visualized. Moderate aortic valve  stenosis.  7. 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   Reproductive/Obstetrics                           Anesthesia Physical Anesthesia Plan  ASA: 4  Anesthesia Plan: General   Post-op Pain Management:    Induction: Intravenous  PONV Risk Score and Plan: 2 and Treatment may vary due to age or medical condition  Airway Management Planned: Oral ETT  Additional Equipment: Arterial line, CVP, TEE and Ultrasound Guidance Line Placement  Intra-op Plan:   Post-operative Plan: Post-operative intubation/ventilation  Informed Consent: I have reviewed the patients History and Physical, chart, labs and discussed the procedure including the risks, benefits and alternatives for the proposed anesthesia with the patient or authorized representative who has indicated his/her understanding and acceptance.     Dental advisory given  Plan Discussed with: CRNA and Anesthesiologist  Anesthesia Plan Comments: (PAT note written 10/27/2021 by Shonna Chock, PA-C. )       Anesthesia Quick Evaluation

## 2021-10-27 NOTE — Progress Notes (Signed)
Anesthesia Chart Review:  Case: [id number] Date/Time: 10/28/21 0715   Procedures:      AORTIC VALVE REPLACEMENT (AVR) (Chest)     MAZE     CLIPPING OF ATRIAL APPENDAGE     TRANSESOPHAGEAL ECHOCARDIOGRAM (TEE)   Anesthesia type: General   Pre-op diagnosis:      AS      AFIB   Location: [facility] OR ROOM 22 / [facility]   Surgeons: Lajuana Matte, MD       DISCUSSION: Patient is a 55 year old male scheduled for the above procedure.  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  Reported last Eliquis 10/22/21.   10/27/21 A1c and presurgical COVID-19 tests are in process. Anesthesia team to evaluate on the day of surgery.    VS: BP 113/83   Pulse 90   Temp 36.7 C   Resp 17   Ht 5\' 5"  (1.651 m)   Wt 101.8 kg   SpO2 99%   BMI 37.34 kg/m    PROVIDERS: Lillard Anes, MD is PCP  Shirlee More, MD is cardiologist Lars Mage, MD is EP cardiologist. Last visit 09/15/21 following 07/2021 admission for dofetilide load, but still failed cardioversion and had QTc prolongation, and Tikosyn abandoned altogether.    LABS: H/H 18.1/53.5, previously 17.3/51.0 on 09/26/21. A1c in process, but was 7.2% on 08/26/21.   (all labs ordered are listed, but only abnormal results are displayed)  Labs Reviewed  GLUCOSE, CAPILLARY - Abnormal; Notable for the following components:      Result Value   Glucose-Capillary 138 (*)    All other components within normal limits  CBC - Abnormal; Notable for the following components:   RBC 6.16 (*)    Hemoglobin 18.1 (*)    HCT 53.5 (*)    All other components within normal limits  COMPREHENSIVE METABOLIC PANEL - Abnormal; Notable for the following components:   Glucose, Bld 157 (*)    BUN 21 (*)    ALT 49 (*)    All other components  within normal limits  URINALYSIS, ROUTINE W REFLEX MICROSCOPIC - Abnormal; Notable for the following components:   Hgb urine dipstick SMALL (*)    Protein, ur 100 (*)    Bacteria, UA RARE (*)    All other components within normal limits  SURGICAL PCR SCREEN  SARS CORONAVIRUS 2 (TAT 6-24 HRS)  PROTIME-INR  APTT  HEMOGLOBIN A1C  TYPE AND SCREEN     IMAGES: CXR 10/27/21: FINDINGS: The cardiomediastinal silhouette is within normal limits. The lungs are well inflated and clear. No pleural effusion or pneumothorax is identified. Bilateral nipple shadows are noted. No acute osseous abnormality is seen. IMPRESSION: No active cardiopulmonary disease.   CTA Chest 10/02/21: IMPRESSION: 1. Normal caliber thoracic aorta without aneurysmal dilatation or acute aortic findings. Mild aortic atherosclerosis. 2. Mild central bronchial thickening with occasional areas of mucous plugging and minimal retained mucus in the left mainstem bronchus. 3. The 3 mm right lower lobe pulmonary nodule described on prior cardiac CT is not seen on the current exam and has likely resolved. No new pulmonary nodule.   EKG: 10/27/21: Atrial fibrillation at 92 bpm Incomplete right bundle branch block Possible Right ventricular hypertrophy Abnormal ECG   CV: US Carotid 10/27/21: Summary:  - Right Carotid: Velocities in the right ICA are consistent with a  1-39%  stenosis.  - Left Carotid: The extracranial vessels were near-normal with only minimal  wall thickening or plaque.  - Vertebrals:  Bilateral vertebral arteries demonstrate antegrade flow.  Subclavians: Normal flow hemodynamics were seen in bilateral subclavian arteries.    RHC/LHC 09/26/21:   Prox LAD to Mid LAD lesion is 35% stenosed.   LV end diastolic pressure is moderately elevated.   Hemodynamic findings consistent with moderate pulmonary hypertension.   Nonobstructive CAD Aortic stenosis. Gradient cannot be accurately measured by  pullback given Afib but there is an AV gradient. Moderately elevated LV filling pressures Moderate pulmonary HTN Cardiac index 1.9    Plan: will initiate lasix 20 mg daily. Continue other therapy. CT surgery assessment in process for AVR/Maze/LA ligation.   Echo 05/13/21: IMPRESSIONS   1. Left ventricular ejection fraction, by estimation, is 45 to 50%. The  left ventricle has mildly decreased function. Left ventricular diastolic  parameters are indeterminate.   2. Right ventricular systolic function is normal. The right ventricular  size is normal. There is normal pulmonary artery systolic pressure.   3. Left atrial size was mildly dilated.   4. Right atrial size was mildly dilated.   5. The mitral valve is normal in structure. Trivial mitral valve  regurgitation. No evidence of mitral stenosis.   6. Aortic valve regurgitation is not visualized. Moderate aortic valve  stenosis. Aortic  valve mean gradient measures 29.8 mmHg. Aortic valve peak gradient  measures 45.9 mmHg. Aortic valve area, by VTI  measures 0.60 cm.   7. 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    48 hour-28 day ZioPatch monitor [facility]): Patient had a min HR of 64 bpm, max HR of 184 bpm, and avg HR of 89 bpm.  Predominant underlying rhythm was Sinus Rhythm.  1 run of Supraventricular Tachycardia occurred lasting 6 beats with a max rate of 184 bpm (avg 171 bpm).  Isolated SVEs were occasional (3.1%, 12734), SVE Couplets were rare (<1.0%, 40), and SVE Triplets were rare (<1.0%, 3).  Isolated VEs were rare (<1.0%), VE Couplets were rare (<1.0%), and no VE Triplets were present.  Symptoms of dizziness, chest pain/pressure correlate with sinus tachycardia.   Conclusion:  No significant arrhythmias noted.   Past Medical History:  Diagnosis Date   Atrial fibrillation with rapid ventricular response (HCC) 08/12/2020   Bicuspid aortic valve    CHF (congestive heart failure) (HCC)    Chronic hepatitis C virus infection (HCC)  08/03/2013   COPD (chronic obstructive pulmonary disease) (HCC)    Coronary artery disease    Diabetes mellitus type 2, insulin dependent (HCC)    Dysrhythmia    A Fib   Essential hypertension 07/03/2015   Last Assessment & Plan:  Blood pressure is borderline high.  Advised to get better sleep, walk on a regular basis, decrease weight down to about 210 and maintain, and eliminate tobacco.   HFrEF (heart failure with reduced ejection fraction) (HCC) 08/22/2020   Long-term insulin use (HCC) 10/12/2016   Obesity, unspecified 10/12/2016   Patient advised to lose down to about 210 pounds and maintained.  Regular walking program recommended.  He knows how to lose weight.   Sleep apnea     Past Surgical History:  Procedure Laterality Date   CARDIAC CATHETERIZATION     CARDIOVERSION N/A 09/24/2020   Procedure: CARDIOVERSION;  Surgeon: [name], MD;  Location: [facility] ENDOSCOPY;  Service: Cardiovascular;  Laterality: N/A;   CARDIOVERSION N/A 08/13/2021   Procedure: CARDIOVERSION;  Surgeon:  Elouise Munroe, MD;  Location: [facility] ENDOSCOPY;  Service: Cardiovascular;  Laterality: N/A;   CCTA  08/2019   RIGHT/LEFT HEART CATH AND CORONARY ANGIOGRAPHY N/A 09/26/2021   Procedure: RIGHT/LEFT HEART CATH AND CORONARY ANGIOGRAPHY;  Surgeon: Martinique, Peter M, MD;  Location: [place] CV LAB;  Service: Cardiovascular;  Laterality: N/A;   TEE WITHOUT CARDIOVERSION  09/24/2020   Procedure: TRANSESOPHAGEAL ECHOCARDIOGRAM (TEE);  Surgeon: Donato [name], MD;  [facility] ENDOSCOPY;  Service: Cardiovascular;;    MEDICATIONS:  albuterol (PROVENTIL) (2.5 MG/3ML) 0.083% nebulizer solution   ALPRAZolam (XANAX) 0.5 MG tablet   ELIQUIS 5 MG TABS tablet   furosemide (LASIX) 20 MG tablet   metFORMIN (GLUCOPHAGE) 500 MG tablet   metoprolol succinate (TOPROL-XL) 100 MG 24 hr tablet   rosuvastatin (CRESTOR) 40 MG tablet   sacubitril-valsartan (ENTRESTO) 49-51 MG   traMADol (ULTRAM) 50 MG tablet    TRESIBA FLEXTOUCH 100 UNIT/ML FlexTouch Pen   umeclidinium-vilanterol (ANORO ELLIPTA) 62.5-25 MCG/INH AEPB   VICTOZA 18 MG/3ML SOPN   zolpidem (AMBIEN) 10 MG tablet   No current facility-administered medications for this encounter.    [START ON 10/28/2021] ceFAZolin (ANCEF) IVPB 2g/100 mL premix   [START ON 10/28/2021] ceFAZolin (ANCEF) IVPB 2g/100 mL premix   [START ON 10/28/2021] dexmedetomidine (PRECEDEX) 400 MCG/100ML (4 mcg/mL) infusion   [START ON 10/28/2021] EPINEPHrine (ADRENALIN) 5 mg in NS 250 mL (0.02 mg/mL) premix infusion   [START ON 10/28/2021] heparin 30,000 units/NS 1000 mL solution for CELLSAVER   [START ON 10/28/2021] heparin sodium (porcine) 5,000 Units, papaverine 60 mg in electrolyte-A (PLASMALYTE-A PH 7.4) 1,000 mL irrigation   [START ON 10/28/2021] insulin regular, human (MYXREDLIN) 100 units/ 100 mL infusion   [START ON 10/28/2021] [place] Blood Cardioplegia vial (lidocaine/magnesium/mannitol 0.26g-4g-6.4g)   [START ON 10/28/2021] magnesium sulfate (IV Push/IM) injection 40 mEq   [START ON 10/28/2021] milrinone (PRIMACOR) 20 MG/100 ML (0.2 mg/mL) infusion   [START ON 10/28/2021] nitroGLYCERIN 50 mg in dextrose 5 % 250 mL (0.2 mg/mL) infusion   [START ON 10/28/2021] norepinephrine (LEVOPHED) 4mg  in 237mL premix infusion   [START ON 10/28/2021] phenylephrine (NEO-SYNEPHRINE) 20mg /NS 257mL premix infusion   [START ON 10/28/2021] potassium chloride injection 80 mEq   [START ON 10/28/2021] tranexamic acid (CYKLOKAPRON) 2,500 mg in sodium chloride 0.9 % 250 mL (10 mg/mL) infusion   [START ON 10/28/2021] tranexamic acid (CYKLOKAPRON) bolus via infusion - over 30 minutes 1,527 mg   [START ON 10/28/2021] tranexamic acid (CYKLOKAPRON) pump prime solution 204 mg   [START ON 10/28/2021] vancomycin (VANCOREADY) IVPB 1500 mg/300 mL    Myra Gianotti, PA-C Surgical Short Stay/Anesthesiology [facility] Phone [phone] [facility] Phone [phone] 10/27/2021 12:57  PM

## 2021-10-27 NOTE — Progress Notes (Signed)
PCP - Brent Bulla, Ramon Dredge, MD Cardiologist - Norman Herrlich, MD  PPM/ICD - denies Device Orders - n/a Rep Notified - n/a  Chest x-ray - 10/27/2021 EKG - 10/27/2021 Stress Test - denies ECHO - 05/13/2021 Cardiac Cath - 09/26/2021  Sleep Study - yes, positive for OSA CPAP - no   Checks Blood Sugar - patient is checking CBG randomly, most of the time in the afternoon. CBG in general between 130-160 CBG today - 138 A1C in PAT 10/27/2021  Blood Thinner Instructions: Eliquis - last dose - 10/22/2021 per patient  Aspirin Instructions: Patient was instructed: As of today, STOP taking any Aspirin (unless otherwise instructed by your surgeon) Aleve, Naproxen, Ibuprofen, Motrin, Advil, Goody's, BC's, all herbal medications, fish oil, and all vitamins  ERAS Protcol - No PRE-SURGERY Ensure or G2- n/a  COVID TEST- done in PAT on 10/27/2021   Anesthesia review: yes, cardiac history - Orvan Seen was notified.  Patient denies shortness of breath, fever, cough and chest pain at PAT appointment   All instructions explained to the patient, with a verbal understanding of the material. Patient agrees to go over the instructions while at home for a better understanding. Patient also instructed to self quarantine after being tested for COVID-19. The opportunity to ask questions was provided.

## 2021-10-28 ENCOUNTER — Inpatient Hospital Stay (HOSPITAL_COMMUNITY): Payer: 59

## 2021-10-28 ENCOUNTER — Inpatient Hospital Stay (HOSPITAL_COMMUNITY): Payer: 59 | Admitting: Anesthesiology

## 2021-10-28 ENCOUNTER — Inpatient Hospital Stay (HOSPITAL_COMMUNITY)
Admission: RE | Admit: 2021-10-28 | Discharge: 2021-11-04 | DRG: 219 | Disposition: A | Discharge status: Home / Self Care | Payer: 59 | Attending: Thoracic Surgery (Cardiothoracic Vascular Surgery) | Admitting: Thoracic Surgery (Cardiothoracic Vascular Surgery)

## 2021-10-28 ENCOUNTER — Encounter (HOSPITAL_COMMUNITY)
Admission: RE | Disposition: A | Discharge status: Home / Self Care | Payer: Self-pay | Source: Home / Self Care | Attending: Thoracic Surgery (Cardiothoracic Vascular Surgery)

## 2021-10-28 ENCOUNTER — Inpatient Hospital Stay (HOSPITAL_COMMUNITY): Payer: 59 | Admitting: Vascular Surgery

## 2021-10-28 DIAGNOSIS — E1142 Type 2 diabetes mellitus with diabetic polyneuropathy: Secondary | ICD-10-CM | POA: Diagnosis present

## 2021-10-28 DIAGNOSIS — E1165 Type 2 diabetes mellitus with hyperglycemia: Secondary | ICD-10-CM | POA: Diagnosis present

## 2021-10-28 DIAGNOSIS — I272 Pulmonary hypertension, unspecified: Secondary | ICD-10-CM | POA: Diagnosis present

## 2021-10-28 DIAGNOSIS — N179 Acute kidney failure, unspecified: Secondary | ICD-10-CM | POA: Diagnosis not present

## 2021-10-28 DIAGNOSIS — J449 Chronic obstructive pulmonary disease, unspecified: Secondary | ICD-10-CM | POA: Diagnosis present

## 2021-10-28 DIAGNOSIS — K567 Ileus, unspecified: Secondary | ICD-10-CM

## 2021-10-28 DIAGNOSIS — I4819 Other persistent atrial fibrillation: Secondary | ICD-10-CM | POA: Diagnosis present

## 2021-10-28 DIAGNOSIS — I2510 Atherosclerotic heart disease of native coronary artery without angina pectoris: Secondary | ICD-10-CM | POA: Diagnosis present

## 2021-10-28 DIAGNOSIS — E785 Hyperlipidemia, unspecified: Secondary | ICD-10-CM | POA: Diagnosis present

## 2021-10-28 DIAGNOSIS — I5022 Chronic systolic (congestive) heart failure: Secondary | ICD-10-CM | POA: Diagnosis present

## 2021-10-28 DIAGNOSIS — B182 Chronic viral hepatitis C: Secondary | ICD-10-CM | POA: Diagnosis present

## 2021-10-28 DIAGNOSIS — Z8616 Personal history of COVID-19: Secondary | ICD-10-CM

## 2021-10-28 DIAGNOSIS — G4733 Obstructive sleep apnea (adult) (pediatric): Secondary | ICD-10-CM | POA: Diagnosis present

## 2021-10-28 DIAGNOSIS — N19 Unspecified kidney failure: Secondary | ICD-10-CM

## 2021-10-28 DIAGNOSIS — D696 Thrombocytopenia, unspecified: Secondary | ICD-10-CM | POA: Diagnosis not present

## 2021-10-28 DIAGNOSIS — J95821 Acute postprocedural respiratory failure: Secondary | ICD-10-CM | POA: Diagnosis not present

## 2021-10-28 DIAGNOSIS — I350 Nonrheumatic aortic (valve) stenosis: Secondary | ICD-10-CM

## 2021-10-28 DIAGNOSIS — Z20822 Contact with and (suspected) exposure to covid-19: Secondary | ICD-10-CM | POA: Diagnosis present

## 2021-10-28 DIAGNOSIS — Z87891 Personal history of nicotine dependence: Secondary | ICD-10-CM

## 2021-10-28 DIAGNOSIS — Z79899 Other long term (current) drug therapy: Secondary | ICD-10-CM

## 2021-10-28 DIAGNOSIS — J90 Pleural effusion, not elsewhere classified: Secondary | ICD-10-CM

## 2021-10-28 DIAGNOSIS — I110 Hypertensive heart disease with heart failure: Secondary | ICD-10-CM | POA: Diagnosis present

## 2021-10-28 DIAGNOSIS — Q231 Congenital insufficiency of aortic valve: Principal | ICD-10-CM

## 2021-10-28 DIAGNOSIS — Z6841 Body Mass Index (BMI) 40.0 and over, adult: Secondary | ICD-10-CM

## 2021-10-28 DIAGNOSIS — Z79891 Long term (current) use of opiate analgesic: Secondary | ICD-10-CM

## 2021-10-28 DIAGNOSIS — I712 Thoracic aortic aneurysm, without rupture, unspecified: Secondary | ICD-10-CM | POA: Diagnosis present

## 2021-10-28 DIAGNOSIS — J9601 Acute respiratory failure with hypoxia: Secondary | ICD-10-CM | POA: Diagnosis not present

## 2021-10-28 DIAGNOSIS — J410 Simple chronic bronchitis: Secondary | ICD-10-CM | POA: Diagnosis not present

## 2021-10-28 DIAGNOSIS — R451 Restlessness and agitation: Secondary | ICD-10-CM | POA: Diagnosis not present

## 2021-10-28 DIAGNOSIS — Z7985 Long-term (current) use of injectable non-insulin antidiabetic drugs: Secondary | ICD-10-CM

## 2021-10-28 DIAGNOSIS — Z7901 Long term (current) use of anticoagulants: Secondary | ICD-10-CM

## 2021-10-28 DIAGNOSIS — I480 Paroxysmal atrial fibrillation: Secondary | ICD-10-CM | POA: Diagnosis not present

## 2021-10-28 DIAGNOSIS — K7469 Other cirrhosis of liver: Secondary | ICD-10-CM | POA: Diagnosis present

## 2021-10-28 DIAGNOSIS — J441 Chronic obstructive pulmonary disease with (acute) exacerbation: Secondary | ICD-10-CM | POA: Diagnosis not present

## 2021-10-28 DIAGNOSIS — Z794 Long term (current) use of insulin: Secondary | ICD-10-CM

## 2021-10-28 DIAGNOSIS — Z9911 Dependence on respirator [ventilator] status: Secondary | ICD-10-CM | POA: Diagnosis not present

## 2021-10-28 DIAGNOSIS — D62 Acute posthemorrhagic anemia: Secondary | ICD-10-CM | POA: Diagnosis not present

## 2021-10-28 DIAGNOSIS — Z8249 Family history of ischemic heart disease and other diseases of the circulatory system: Secondary | ICD-10-CM

## 2021-10-28 DIAGNOSIS — Z952 Presence of prosthetic heart valve: Secondary | ICD-10-CM

## 2021-10-28 DIAGNOSIS — Z09 Encounter for follow-up examination after completed treatment for conditions other than malignant neoplasm: Secondary | ICD-10-CM

## 2021-10-28 DIAGNOSIS — F419 Anxiety disorder, unspecified: Secondary | ICD-10-CM | POA: Diagnosis present

## 2021-10-28 DIAGNOSIS — Z7984 Long term (current) use of oral hypoglycemic drugs: Secondary | ICD-10-CM

## 2021-10-28 DIAGNOSIS — Z9889 Other specified postprocedural states: Secondary | ICD-10-CM

## 2021-10-28 HISTORY — PX: AORTIC ROOT ENLARGEMENT: SHX6346

## 2021-10-28 HISTORY — PX: AORTIC VALVE REPLACEMENT: SHX41

## 2021-10-28 HISTORY — PX: MAZE: SHX5063

## 2021-10-28 HISTORY — PX: TEE WITHOUT CARDIOVERSION: SHX5443

## 2021-10-28 HISTORY — PX: CLIPPING OF ATRIAL APPENDAGE: SHX5773

## 2021-10-28 HISTORY — DX: Presence of prosthetic heart valve: Z95.2

## 2021-10-28 LAB — POCT I-STAT 7, (LYTES, BLD GAS, ICA,H+H)
Acid-Base Excess: 5 mmol/L — ABNORMAL HIGH (ref 0.0–2.0)
Acid-Base Excess: 7 mmol/L — ABNORMAL HIGH (ref 0.0–2.0)
Acid-Base Excess: 5 mmol/L — ABNORMAL HIGH (ref 0.0–2.0)
Acid-Base Excess: 6 mmol/L — ABNORMAL HIGH (ref 0.0–2.0)
Acid-Base Excess: 5 mmol/L — ABNORMAL HIGH (ref 0.0–2.0)
Acid-Base Excess: 6 mmol/L — ABNORMAL HIGH (ref 0.0–2.0)
Acid-Base Excess: 6 mmol/L — ABNORMAL HIGH (ref 0.0–2.0)
Acid-Base Excess: 5 mmol/L — ABNORMAL HIGH (ref 0.0–2.0)
Acid-Base Excess: 0 mmol/L (ref 0.0–2.0)
Acid-base deficit: 3 mmol/L — ABNORMAL HIGH (ref 0.0–2.0)
Acid-base deficit: 3 mmol/L — ABNORMAL HIGH (ref 0.0–2.0)
Acid-base deficit: 4 mmol/L — ABNORMAL HIGH (ref 0.0–2.0)
Acid-base deficit: 4 mmol/L — ABNORMAL HIGH (ref 0.0–2.0)
Bicarbonate: 22.7 mmol/L (ref 20.0–28.0)
Bicarbonate: 23.1 mmol/L (ref 20.0–28.0)
Bicarbonate: 23.4 mmol/L (ref 20.0–28.0)
Bicarbonate: 24 mmol/L (ref 20.0–28.0)
Bicarbonate: 27.8 mmol/L (ref 20.0–28.0)
Bicarbonate: 29 mmol/L — ABNORMAL HIGH (ref 20.0–28.0)
Bicarbonate: 29.6 mmol/L — ABNORMAL HIGH (ref 20.0–28.0)
Bicarbonate: 29.6 mmol/L — ABNORMAL HIGH (ref 20.0–28.0)
Bicarbonate: 29.8 mmol/L — ABNORMAL HIGH (ref 20.0–28.0)
Bicarbonate: 29.9 mmol/L — ABNORMAL HIGH (ref 20.0–28.0)
Bicarbonate: 30.7 mmol/L — ABNORMAL HIGH (ref 20.0–28.0)
Bicarbonate: 31.1 mmol/L — ABNORMAL HIGH (ref 20.0–28.0)
Bicarbonate: 32.5 mmol/L — ABNORMAL HIGH (ref 20.0–28.0)
Calcium, Ion: 0.91 mmol/L — ABNORMAL LOW (ref 1.15–1.40)
Calcium, Ion: 0.96 mmol/L — ABNORMAL LOW (ref 1.15–1.40)
Calcium, Ion: 0.98 mmol/L — ABNORMAL LOW (ref 1.15–1.40)
Calcium, Ion: 1 mmol/L — ABNORMAL LOW (ref 1.15–1.40)
Calcium, Ion: 1.01 mmol/L — ABNORMAL LOW (ref 1.15–1.40)
Calcium, Ion: 1.01 mmol/L — ABNORMAL LOW (ref 1.15–1.40)
Calcium, Ion: 1.01 mmol/L — ABNORMAL LOW (ref 1.15–1.40)
Calcium, Ion: 1.04 mmol/L — ABNORMAL LOW (ref 1.15–1.40)
Calcium, Ion: 1.06 mmol/L — ABNORMAL LOW (ref 1.15–1.40)
Calcium, Ion: 1.07 mmol/L — ABNORMAL LOW (ref 1.15–1.40)
Calcium, Ion: 1.08 mmol/L — ABNORMAL LOW (ref 1.15–1.40)
Calcium, Ion: 1.09 mmol/L — ABNORMAL LOW (ref 1.15–1.40)
Calcium, Ion: 1.11 mmol/L — ABNORMAL LOW (ref 1.15–1.40)
HCT: 29 % — ABNORMAL LOW (ref 39.0–52.0)
HCT: 30 % — ABNORMAL LOW (ref 39.0–52.0)
HCT: 32 % — ABNORMAL LOW (ref 39.0–52.0)
HCT: 32 % — ABNORMAL LOW (ref 39.0–52.0)
HCT: 33 % — ABNORMAL LOW (ref 39.0–52.0)
HCT: 33 % — ABNORMAL LOW (ref 39.0–52.0)
HCT: 35 % — ABNORMAL LOW (ref 39.0–52.0)
HCT: 35 % — ABNORMAL LOW (ref 39.0–52.0)
HCT: 36 % — ABNORMAL LOW (ref 39.0–52.0)
HCT: 37 % — ABNORMAL LOW (ref 39.0–52.0)
HCT: 38 % — ABNORMAL LOW (ref 39.0–52.0)
HCT: 40 % (ref 39.0–52.0)
HCT: 42 % (ref 39.0–52.0)
Hemoglobin: 10.2 g/dL — ABNORMAL LOW (ref 13.0–17.0)
Hemoglobin: 10.9 g/dL — ABNORMAL LOW (ref 13.0–17.0)
Hemoglobin: 10.9 g/dL — ABNORMAL LOW (ref 13.0–17.0)
Hemoglobin: 11.2 g/dL — ABNORMAL LOW (ref 13.0–17.0)
Hemoglobin: 11.2 g/dL — ABNORMAL LOW (ref 13.0–17.0)
Hemoglobin: 11.9 g/dL — ABNORMAL LOW (ref 13.0–17.0)
Hemoglobin: 11.9 g/dL — ABNORMAL LOW (ref 13.0–17.0)
Hemoglobin: 12.2 g/dL — ABNORMAL LOW (ref 13.0–17.0)
Hemoglobin: 12.6 g/dL — ABNORMAL LOW (ref 13.0–17.0)
Hemoglobin: 12.9 g/dL — ABNORMAL LOW (ref 13.0–17.0)
Hemoglobin: 13.6 g/dL (ref 13.0–17.0)
Hemoglobin: 14.3 g/dL (ref 13.0–17.0)
Hemoglobin: 9.9 g/dL — ABNORMAL LOW (ref 13.0–17.0)
O2 Saturation: 100 %
O2 Saturation: 100 %
O2 Saturation: 100 %
O2 Saturation: 100 %
O2 Saturation: 100 %
O2 Saturation: 100 %
O2 Saturation: 100 %
O2 Saturation: 100 %
O2 Saturation: 100 %
O2 Saturation: 100 %
O2 Saturation: 93 %
O2 Saturation: 99 %
O2 Saturation: 99 %
Patient temperature: 37.4
Patient temperature: 37.9
Patient temperature: 37.9
Patient temperature: 38
Patient temperature: 38.3
Potassium: 3.6 mmol/L (ref 3.5–5.1)
Potassium: 4 mmol/L (ref 3.5–5.1)
Potassium: 4.2 mmol/L (ref 3.5–5.1)
Potassium: 4.2 mmol/L (ref 3.5–5.1)
Potassium: 4.3 mmol/L (ref 3.5–5.1)
Potassium: 4.3 mmol/L (ref 3.5–5.1)
Potassium: 4.8 mmol/L (ref 3.5–5.1)
Potassium: 4.9 mmol/L (ref 3.5–5.1)
Potassium: 4.9 mmol/L (ref 3.5–5.1)
Potassium: 4.9 mmol/L (ref 3.5–5.1)
Potassium: 5.4 mmol/L — ABNORMAL HIGH (ref 3.5–5.1)
Potassium: 6 mmol/L — ABNORMAL HIGH (ref 3.5–5.1)
Potassium: 7.6 mmol/L (ref 3.5–5.1)
Sodium: 135 mmol/L (ref 135–145)
Sodium: 137 mmol/L (ref 135–145)
Sodium: 138 mmol/L (ref 135–145)
Sodium: 138 mmol/L (ref 135–145)
Sodium: 139 mmol/L (ref 135–145)
Sodium: 139 mmol/L (ref 135–145)
Sodium: 141 mmol/L (ref 135–145)
Sodium: 141 mmol/L (ref 135–145)
Sodium: 141 mmol/L (ref 135–145)
Sodium: 142 mmol/L (ref 135–145)
Sodium: 142 mmol/L (ref 135–145)
Sodium: 142 mmol/L (ref 135–145)
Sodium: 143 mmol/L (ref 135–145)
TCO2: 24 mmol/L (ref 22–32)
TCO2: 25 mmol/L (ref 22–32)
TCO2: 25 mmol/L (ref 22–32)
TCO2: 26 mmol/L (ref 22–32)
TCO2: 29 mmol/L (ref 22–32)
TCO2: 30 mmol/L (ref 22–32)
TCO2: 31 mmol/L (ref 22–32)
TCO2: 31 mmol/L (ref 22–32)
TCO2: 31 mmol/L (ref 22–32)
TCO2: 31 mmol/L (ref 22–32)
TCO2: 32 mmol/L (ref 22–32)
TCO2: 33 mmol/L — ABNORMAL HIGH (ref 22–32)
TCO2: 34 mmol/L — ABNORMAL HIGH (ref 22–32)
pCO2 arterial: 34.8 mmHg (ref 32.0–48.0)
pCO2 arterial: 39 mmHg (ref 32.0–48.0)
pCO2 arterial: 41.8 mmHg (ref 32.0–48.0)
pCO2 arterial: 42.9 mmHg (ref 32.0–48.0)
pCO2 arterial: 43.3 mmHg (ref 32.0–48.0)
pCO2 arterial: 43.9 mmHg (ref 32.0–48.0)
pCO2 arterial: 46.5 mmHg (ref 32.0–48.0)
pCO2 arterial: 47.5 mmHg (ref 32.0–48.0)
pCO2 arterial: 50.2 mmHg — ABNORMAL HIGH (ref 32.0–48.0)
pCO2 arterial: 50.9 mmHg — ABNORMAL HIGH (ref 32.0–48.0)
pCO2 arterial: 53.7 mmHg — ABNORMAL HIGH (ref 32.0–48.0)
pCO2 arterial: 55.8 mmHg — ABNORMAL HIGH (ref 32.0–48.0)
pCO2 arterial: 58 mmHg — ABNORMAL HIGH (ref 32.0–48.0)
pH, Arterial: 7.229 — ABNORMAL LOW (ref 7.350–7.450)
pH, Arterial: 7.253 — ABNORMAL LOW (ref 7.350–7.450)
pH, Arterial: 7.301 — ABNORMAL LOW (ref 7.350–7.450)
pH, Arterial: 7.307 — ABNORMAL LOW (ref 7.350–7.450)
pH, Arterial: 7.336 — ABNORMAL LOW (ref 7.350–7.450)
pH, Arterial: 7.394 (ref 7.350–7.450)
pH, Arterial: 7.417 (ref 7.350–7.450)
pH, Arterial: 7.419 (ref 7.350–7.450)
pH, Arterial: 7.446 (ref 7.350–7.450)
pH, Arterial: 7.452 — ABNORMAL HIGH (ref 7.350–7.450)
pH, Arterial: 7.459 — ABNORMAL HIGH (ref 7.350–7.450)
pH, Arterial: 7.489 — ABNORMAL HIGH (ref 7.350–7.450)
pH, Arterial: 7.529 — ABNORMAL HIGH (ref 7.350–7.450)
pO2, Arterial: 137 mmHg — ABNORMAL HIGH (ref 83.0–108.0)
pO2, Arterial: 182 mmHg — ABNORMAL HIGH (ref 83.0–108.0)
pO2, Arterial: 199 mmHg — ABNORMAL HIGH (ref 83.0–108.0)
pO2, Arterial: 245 mmHg — ABNORMAL HIGH (ref 83.0–108.0)
pO2, Arterial: 272 mmHg — ABNORMAL HIGH (ref 83.0–108.0)
pO2, Arterial: 283 mmHg — ABNORMAL HIGH (ref 83.0–108.0)
pO2, Arterial: 309 mmHg — ABNORMAL HIGH (ref 83.0–108.0)
pO2, Arterial: 315 mmHg — ABNORMAL HIGH (ref 83.0–108.0)
pO2, Arterial: 331 mmHg — ABNORMAL HIGH (ref 83.0–108.0)
pO2, Arterial: 370 mmHg — ABNORMAL HIGH (ref 83.0–108.0)
pO2, Arterial: 393 mmHg — ABNORMAL HIGH (ref 83.0–108.0)
pO2, Arterial: 540 mmHg — ABNORMAL HIGH (ref 83.0–108.0)
pO2, Arterial: 76 mmHg — ABNORMAL LOW (ref 83.0–108.0)

## 2021-10-28 LAB — HEMOGLOBIN A1C
Hgb A1c MFr Bld: 8 % — ABNORMAL HIGH (ref 4.8–5.6)
Mean Plasma Glucose: 183 mg/dL

## 2021-10-28 LAB — POCT I-STAT, CHEM 8
BUN: 23 mg/dL — ABNORMAL HIGH (ref 6–20)
BUN: 23 mg/dL — ABNORMAL HIGH (ref 6–20)
BUN: 23 mg/dL — ABNORMAL HIGH (ref 6–20)
BUN: 23 mg/dL — ABNORMAL HIGH (ref 6–20)
BUN: 21 mg/dL — ABNORMAL HIGH (ref 6–20)
BUN: 20 mg/dL (ref 6–20)
Calcium, Ion: 1.21 mmol/L (ref 1.15–1.40)
Calcium, Ion: 1.12 mmol/L — ABNORMAL LOW (ref 1.15–1.40)
Calcium, Ion: 0.96 mmol/L — ABNORMAL LOW (ref 1.15–1.40)
Calcium, Ion: 0.97 mmol/L — ABNORMAL LOW (ref 1.15–1.40)
Calcium, Ion: 0.9 mmol/L — ABNORMAL LOW (ref 1.15–1.40)
Calcium, Ion: 1.07 mmol/L — ABNORMAL LOW (ref 1.15–1.40)
Chloride: 101 mmol/L (ref 98–111)
Chloride: 101 mmol/L (ref 98–111)
Chloride: 105 mmol/L (ref 98–111)
Chloride: 100 mmol/L (ref 98–111)
Chloride: 100 mmol/L (ref 98–111)
Chloride: 103 mmol/L (ref 98–111)
Creatinine, Ser: 0.8 mg/dL (ref 0.61–1.24)
Creatinine, Ser: 0.7 mg/dL (ref 0.61–1.24)
Creatinine, Ser: 0.7 mg/dL (ref 0.61–1.24)
Creatinine, Ser: 0.7 mg/dL (ref 0.61–1.24)
Creatinine, Ser: 0.6 mg/dL — ABNORMAL LOW (ref 0.61–1.24)
Creatinine, Ser: 0.7 mg/dL (ref 0.61–1.24)
Glucose, Bld: 152 mg/dL — ABNORMAL HIGH (ref 70–99)
Glucose, Bld: 140 mg/dL — ABNORMAL HIGH (ref 70–99)
Glucose, Bld: 98 mg/dL (ref 70–99)
Glucose, Bld: 89 mg/dL (ref 70–99)
Glucose, Bld: 106 mg/dL — ABNORMAL HIGH (ref 70–99)
Glucose, Bld: 116 mg/dL — ABNORMAL HIGH (ref 70–99)
HCT: 47 % (ref 39.0–52.0)
HCT: 46 % (ref 39.0–52.0)
HCT: 33 % — ABNORMAL LOW (ref 39.0–52.0)
HCT: 35 % — ABNORMAL LOW (ref 39.0–52.0)
HCT: 33 % — ABNORMAL LOW (ref 39.0–52.0)
HCT: 33 % — ABNORMAL LOW (ref 39.0–52.0)
Hemoglobin: 16 g/dL (ref 13.0–17.0)
Hemoglobin: 15.6 g/dL (ref 13.0–17.0)
Hemoglobin: 11.2 g/dL — ABNORMAL LOW (ref 13.0–17.0)
Hemoglobin: 11.9 g/dL — ABNORMAL LOW (ref 13.0–17.0)
Hemoglobin: 11.2 g/dL — ABNORMAL LOW (ref 13.0–17.0)
Hemoglobin: 11.2 g/dL — ABNORMAL LOW (ref 13.0–17.0)
Potassium: 4.2 mmol/L (ref 3.5–5.1)
Potassium: 3.9 mmol/L (ref 3.5–5.1)
Potassium: 7.5 mmol/L (ref 3.5–5.1)
Potassium: 4.8 mmol/L (ref 3.5–5.1)
Potassium: 4.9 mmol/L (ref 3.5–5.1)
Potassium: 4 mmol/L (ref 3.5–5.1)
Sodium: 139 mmol/L (ref 135–145)
Sodium: 138 mmol/L (ref 135–145)
Sodium: 135 mmol/L (ref 135–145)
Sodium: 137 mmol/L (ref 135–145)
Sodium: 137 mmol/L (ref 135–145)
Sodium: 141 mmol/L (ref 135–145)
TCO2: 29 mmol/L (ref 22–32)
TCO2: 30 mmol/L (ref 22–32)
TCO2: 28 mmol/L (ref 22–32)
TCO2: 29 mmol/L (ref 22–32)
TCO2: 28 mmol/L (ref 22–32)
TCO2: 29 mmol/L (ref 22–32)

## 2021-10-28 LAB — BASIC METABOLIC PANEL
Anion gap: 6 (ref 5–15)
BUN: 19 mg/dL (ref 6–20)
CO2: 24 mmol/L (ref 22–32)
Calcium: 7.2 mg/dL — ABNORMAL LOW (ref 8.9–10.3)
Chloride: 108 mmol/L (ref 98–111)
Creatinine, Ser: 1.24 mg/dL (ref 0.61–1.24)
GFR, Estimated: >60 mL/min (ref >60)
Glucose, Bld: 137 mg/dL — ABNORMAL HIGH (ref 70–99)
Potassium: 5.2 mmol/L — ABNORMAL HIGH (ref 3.5–5.1)
Sodium: 138 mmol/L (ref 135–145)

## 2021-10-28 LAB — PROTIME-INR
INR: 1.6 — ABNORMAL HIGH (ref 0.8–1.2)
Prothrombin Time: 19 seconds — ABNORMAL HIGH (ref 11.4–15.2)

## 2021-10-28 LAB — CBC
HCT: 41.9 % (ref 39.0–52.0)
HCT: 39.8 % (ref 39.0–52.0)
Hemoglobin: 14.3 g/dL (ref 13.0–17.0)
Hemoglobin: 13.1 g/dL (ref 13.0–17.0)
MCH: 29.7 pg (ref 26.0–34.0)
MCH: 29.2 pg (ref 26.0–34.0)
MCHC: 34.1 g/dL (ref 30.0–36.0)
MCHC: 32.9 g/dL (ref 30.0–36.0)
MCV: 86.9 fL (ref 80.0–100.0)
MCV: 88.8 fL (ref 80.0–100.0)
Platelets: 102 10*3/uL — ABNORMAL LOW (ref 150–400)
Platelets: 113 10*3/uL — ABNORMAL LOW (ref 150–400)
RBC: 4.82 MIL/uL (ref 4.22–5.81)
RBC: 4.48 MIL/uL (ref 4.22–5.81)
RDW: 12 % (ref 11.5–15.5)
RDW: 12.4 % (ref 11.5–15.5)
WBC: 17 10*3/uL — ABNORMAL HIGH (ref 4.0–10.5)
WBC: 16.4 10*3/uL — ABNORMAL HIGH (ref 4.0–10.5)
nRBC: 0 % (ref 0.0–0.2)
nRBC: 0 % (ref 0.0–0.2)

## 2021-10-28 LAB — APTT: aPTT: 38 seconds — ABNORMAL HIGH (ref 24–36)

## 2021-10-28 LAB — HEMOGLOBIN AND HEMATOCRIT, BLOOD
HCT: 34 % — ABNORMAL LOW (ref 39.0–52.0)
Hemoglobin: 11.7 g/dL — ABNORMAL LOW (ref 13.0–17.0)

## 2021-10-28 LAB — GLUCOSE, CAPILLARY
Glucose-Capillary: 103 mg/dL — ABNORMAL HIGH (ref 70–99)
Glucose-Capillary: 107 mg/dL — ABNORMAL HIGH (ref 70–99)
Glucose-Capillary: 118 mg/dL — ABNORMAL HIGH (ref 70–99)
Glucose-Capillary: 118 mg/dL — ABNORMAL HIGH (ref 70–99)
Glucose-Capillary: 127 mg/dL — ABNORMAL HIGH (ref 70–99)
Glucose-Capillary: 147 mg/dL — ABNORMAL HIGH (ref 70–99)
Glucose-Capillary: 150 mg/dL — ABNORMAL HIGH (ref 70–99)
Glucose-Capillary: 156 mg/dL — ABNORMAL HIGH (ref 70–99)
Glucose-Capillary: 97 mg/dL (ref 70–99)

## 2021-10-28 LAB — MAGNESIUM: Magnesium: 3.2 mg/dL — ABNORMAL HIGH (ref 1.7–2.4)

## 2021-10-28 LAB — POCT I-STAT EG7
Acid-Base Excess: 5 mmol/L — ABNORMAL HIGH (ref 0.0–2.0)
Bicarbonate: 30.4 mmol/L — ABNORMAL HIGH (ref 20.0–28.0)
Calcium, Ion: 1.08 mmol/L — ABNORMAL LOW (ref 1.15–1.40)
HCT: 38 % — ABNORMAL LOW (ref 39.0–52.0)
Hemoglobin: 12.9 g/dL — ABNORMAL LOW (ref 13.0–17.0)
O2 Saturation: 86 %
Potassium: 3.5 mmol/L (ref 3.5–5.1)
Sodium: 141 mmol/L (ref 135–145)
TCO2: 32 mmol/L (ref 22–32)
pCO2, Ven: 48.7 mmHg (ref 44.0–60.0)
pH, Ven: 7.403 (ref 7.250–7.430)
pO2, Ven: 52 mmHg — ABNORMAL HIGH (ref 32.0–45.0)

## 2021-10-28 LAB — PLATELET COUNT: Platelets: 148 10*3/uL — ABNORMAL LOW (ref 150–400)

## 2021-10-28 LAB — ABO/RH: ABO/RH(D): A POS

## 2021-10-28 SURGERY — REPLACEMENT, AORTIC VALVE, OPEN
Anesthesia: General | Site: Chest

## 2021-10-28 MED ORDER — SODIUM CHLORIDE 0.9 % IV SOLN: INTRAVENOUS | Status: DC

## 2021-10-28 MED ORDER — METOPROLOL TARTRATE 12.5 MG HALF TABLET
12.5000 mg | ORAL_TABLET | Freq: Two times a day (BID) | ORAL | Status: DC
  Administered 2021-10-29 – 2021-11-02 (×10): 12.5 mg via ORAL
  Filled 2021-10-28 (×11): qty 1

## 2021-10-28 MED ORDER — CEFAZOLIN SODIUM-DEXTROSE 2-4 GM/100ML-% IV SOLN
2.0000 g | Freq: Three times a day (TID) | INTRAVENOUS | Status: AC
  Administered 2021-10-28 – 2021-10-30 (×6): 2 g via INTRAVENOUS
  Filled 2021-10-28 (×5): qty 100

## 2021-10-28 MED ORDER — PROTAMINE SULFATE 10 MG/ML IV SOLN
INTRAVENOUS | Status: AC
  Filled 2021-10-28: qty 25

## 2021-10-28 MED ORDER — CHLORHEXIDINE GLUCONATE 0.12 % MT SOLN
OROMUCOSAL | Status: AC
  Filled 2021-10-28: qty 15

## 2021-10-28 MED ORDER — MIDAZOLAM HCL 5 MG/5ML IJ SOLN
INTRAMUSCULAR | Status: DC | PRN
  Administered 2021-10-28: 3 mg via INTRAVENOUS
  Administered 2021-10-28: 2 mg via INTRAVENOUS
  Administered 2021-10-28: 3 mg via INTRAVENOUS
  Administered 2021-10-28: 2 mg via INTRAVENOUS

## 2021-10-28 MED ORDER — INSULIN REGULAR(HUMAN) IN NACL 100-0.9 UT/100ML-% IV SOLN: INTRAVENOUS | Status: DC

## 2021-10-28 MED ORDER — ORAL CARE MOUTH RINSE: 15.0000 mL | Freq: Once | OROMUCOSAL | Status: AC

## 2021-10-28 MED ORDER — DEXMEDETOMIDINE HCL IN NACL 400 MCG/100ML IV SOLN
0.0000 ug/kg/h | INTRAVENOUS | Status: DC
  Administered 2021-10-28: 0.7 ug/kg/h via INTRAVENOUS
  Administered 2021-10-29: 0.2 ug/kg/h via INTRAVENOUS
  Administered 2021-10-29: 0.7 ug/kg/h via INTRAVENOUS
  Filled 2021-10-28 (×2): qty 100

## 2021-10-28 MED ORDER — VASOPRESSIN 20 UNIT/ML IV SOLN
INTRAVENOUS | Status: AC
  Filled 2021-10-28: qty 1

## 2021-10-28 MED ORDER — LACTATED RINGERS IV SOLN: INTRAVENOUS | Status: DC | PRN

## 2021-10-28 MED ORDER — BISACODYL 5 MG PO TBEC
10.0000 mg | DELAYED_RELEASE_TABLET | Freq: Every day | ORAL | Status: DC
  Administered 2021-10-29 – 2021-11-03 (×5): 10 mg via ORAL
  Filled 2021-10-28 (×5): qty 2

## 2021-10-28 MED ORDER — ACETAMINOPHEN 650 MG RE SUPP
650.0000 mg | Freq: Once | RECTAL | Status: AC
  Administered 2021-10-28: 650 mg via RECTAL

## 2021-10-28 MED ORDER — LEVALBUTEROL HCL 0.63 MG/3ML IN NEBU: 0.6300 mg | INHALATION_SOLUTION | Freq: Four times a day (QID) | RESPIRATORY_TRACT | Status: DC | PRN

## 2021-10-28 MED ORDER — FENTANYL CITRATE (PF) 250 MCG/5ML IJ SOLN
INTRAMUSCULAR | Status: AC
  Filled 2021-10-28: qty 5

## 2021-10-28 MED ORDER — FAMOTIDINE IN NACL 20-0.9 MG/50ML-% IV SOLN
20.0000 mg | Freq: Two times a day (BID) | INTRAVENOUS | Status: AC
  Administered 2021-10-28: 20 mg via INTRAVENOUS

## 2021-10-28 MED ORDER — DOCUSATE SODIUM 100 MG PO CAPS
200.0000 mg | ORAL_CAPSULE | Freq: Every day | ORAL | Status: DC
  Administered 2021-10-29 – 2021-11-04 (×7): 200 mg via ORAL
  Filled 2021-10-28 (×7): qty 2

## 2021-10-28 MED ORDER — PLASMA-LYTE A IV SOLN
INTRAVENOUS | Status: DC | PRN
  Administered 2021-10-28: 1000 mL

## 2021-10-28 MED ORDER — AMIODARONE HCL IN DEXTROSE 360-4.14 MG/200ML-% IV SOLN
INTRAVENOUS | Status: DC | PRN
  Administered 2021-10-28: 30 mg/h via INTRAVENOUS

## 2021-10-28 MED ORDER — CHLORHEXIDINE GLUCONATE 0.12% ORAL RINSE (MEDLINE KIT)
15.0000 mL | Freq: Two times a day (BID) | OROMUCOSAL | Status: DC
  Administered 2021-10-28 – 2021-10-29 (×2): 15 mL via OROMUCOSAL

## 2021-10-28 MED ORDER — MIDAZOLAM HCL (PF) 10 MG/2ML IJ SOLN
INTRAMUSCULAR | Status: AC
  Filled 2021-10-28: qty 2

## 2021-10-28 MED ORDER — LACTATED RINGERS IV SOLN: INTRAVENOUS | Status: DC

## 2021-10-28 MED ORDER — HEPARIN SODIUM (PORCINE) 1000 UNIT/ML IJ SOLN
INTRAMUSCULAR | Status: DC | PRN
  Administered 2021-10-28: 31000 [IU] via INTRAVENOUS

## 2021-10-28 MED ORDER — VANCOMYCIN HCL IN DEXTROSE 1-5 GM/200ML-% IV SOLN
1000.0000 mg | Freq: Once | INTRAVENOUS | Status: AC
  Administered 2021-10-28: 1000 mg via INTRAVENOUS
  Filled 2021-10-28: qty 200

## 2021-10-28 MED ORDER — ORAL CARE MOUTH RINSE
15.0000 mL | OROMUCOSAL | Status: DC
  Administered 2021-10-28 – 2021-10-29 (×10): 15 mL via OROMUCOSAL

## 2021-10-28 MED ORDER — SODIUM CHLORIDE 0.9% FLUSH: 3.0000 mL | INTRAVENOUS | Status: DC | PRN

## 2021-10-28 MED ORDER — NITROGLYCERIN IN D5W 200-5 MCG/ML-% IV SOLN: 0.0000 ug/min | INTRAVENOUS | Status: DC

## 2021-10-28 MED ORDER — MIDAZOLAM HCL 2 MG/2ML IJ SOLN
2.0000 mg | INTRAMUSCULAR | Status: DC | PRN
  Administered 2021-10-28 (×4): 2 mg via INTRAVENOUS

## 2021-10-28 MED ORDER — METOPROLOL TARTRATE 12.5 MG HALF TABLET
ORAL_TABLET | ORAL | Status: AC
  Filled 2021-10-28: qty 1

## 2021-10-28 MED ORDER — CHLORHEXIDINE GLUCONATE 0.12 % MT SOLN
15.0000 mL | OROMUCOSAL | Status: AC
  Administered 2021-10-28: 15 mL via OROMUCOSAL

## 2021-10-28 MED ORDER — ROCURONIUM BROMIDE 10 MG/ML (PF) SYRINGE
PREFILLED_SYRINGE | INTRAVENOUS | Status: DC | PRN
  Administered 2021-10-28 (×2): 40 mg via INTRAVENOUS
  Administered 2021-10-28: 30 mg via INTRAVENOUS
  Administered 2021-10-28: 100 mg via INTRAVENOUS
  Administered 2021-10-28: 30 mg via INTRAVENOUS

## 2021-10-28 MED ORDER — ACETAMINOPHEN 160 MG/5ML PO SOLN
1000.0000 mg | Freq: Four times a day (QID) | ORAL | Status: AC
  Administered 2021-10-28 – 2021-10-29 (×2): 1000 mg
  Filled 2021-10-28 (×2): qty 40.6

## 2021-10-28 MED ORDER — MIDAZOLAM BOLUS VIA INFUSION
0.0000 mg | INTRAVENOUS | Status: DC | PRN
  Administered 2021-10-28: 5 mg via INTRAVENOUS
  Administered 2021-10-28 (×2): 4 mg via INTRAVENOUS
  Administered 2021-10-29 (×2): 3 mg via INTRAVENOUS
  Filled 2021-10-28: qty 5

## 2021-10-28 MED ORDER — FENTANYL CITRATE (PF) 250 MCG/5ML IJ SOLN
INTRAMUSCULAR | Status: DC | PRN
  Administered 2021-10-28 (×2): 100 ug via INTRAVENOUS
  Administered 2021-10-28: 150 ug via INTRAVENOUS
  Administered 2021-10-28: 100 ug via INTRAVENOUS
  Administered 2021-10-28: 350 ug via INTRAVENOUS
  Administered 2021-10-28 (×2): 100 ug via INTRAVENOUS

## 2021-10-28 MED ORDER — NOREPINEPHRINE 4 MG/250ML-% IV SOLN
0.0000 ug/min | INTRAVENOUS | Status: DC
Start: 2021-10-28 — End: 2021-10-30
  Administered 2021-10-29: 4 ug/min via INTRAVENOUS
  Filled 2021-10-28: qty 250

## 2021-10-28 MED ORDER — SODIUM CHLORIDE 0.45 % IV SOLN: INTRAVENOUS | Status: DC | PRN

## 2021-10-28 MED ORDER — ALBUMIN HUMAN 5 % IV SOLN: INTRAVENOUS | Status: DC | PRN

## 2021-10-28 MED ORDER — HYDROMORPHONE HCL 1 MG/ML IJ SOLN
0.5000 mg | INTRAMUSCULAR | Status: DC | PRN
Start: 2021-10-28 — End: 2021-10-28
  Administered 2021-10-28: 1 mg via INTRAVENOUS

## 2021-10-28 MED ORDER — CHLORHEXIDINE GLUCONATE 0.12 % MT SOLN: 15.0000 mL | Freq: Once | OROMUCOSAL | Status: DC

## 2021-10-28 MED ORDER — AMIODARONE HCL IN DEXTROSE 360-4.14 MG/200ML-% IV SOLN
30.0000 mg/h | INTRAVENOUS | Status: DC
  Administered 2021-10-28: 30 mg/h via INTRAVENOUS

## 2021-10-28 MED ORDER — TRAMADOL HCL 50 MG PO TABS
50.0000 mg | ORAL_TABLET | ORAL | Status: DC | PRN
  Administered 2021-10-29 – 2021-11-03 (×11): 100 mg via ORAL
  Filled 2021-10-28 (×11): qty 2

## 2021-10-28 MED ORDER — MAGNESIUM SULFATE 4 GM/100ML IV SOLN
4.0000 g | Freq: Once | INTRAVENOUS | Status: AC
  Administered 2021-10-28: 4 g via INTRAVENOUS

## 2021-10-28 MED ORDER — ARFORMOTEROL TARTRATE 15 MCG/2ML IN NEBU
15.0000 ug | INHALATION_SOLUTION | Freq: Two times a day (BID) | RESPIRATORY_TRACT | Status: AC
  Administered 2021-10-28 – 2021-10-30 (×5): 15 ug via RESPIRATORY_TRACT
  Filled 2021-10-28 (×4): qty 2

## 2021-10-28 MED ORDER — SODIUM CHLORIDE 0.9% FLUSH
3.0000 mL | Freq: Two times a day (BID) | INTRAVENOUS | Status: DC
  Administered 2021-10-29 – 2021-11-03 (×9): 3 mL via INTRAVENOUS

## 2021-10-28 MED ORDER — SODIUM CHLORIDE 0.9% FLUSH: 10.0000 mL | INTRAVENOUS | Status: DC | PRN

## 2021-10-28 MED ORDER — SODIUM CHLORIDE 0.9% FLUSH
10.0000 mL | Freq: Two times a day (BID) | INTRAVENOUS | Status: DC
  Administered 2021-10-29: 10 mL
  Administered 2021-10-29: 30 mL
  Administered 2021-10-30 – 2021-11-04 (×10): 10 mL

## 2021-10-28 MED ORDER — PANTOPRAZOLE SODIUM 40 MG PO TBEC
40.0000 mg | DELAYED_RELEASE_TABLET | Freq: Every day | ORAL | Status: DC
  Administered 2021-10-30 – 2021-11-04 (×6): 40 mg via ORAL
  Filled 2021-10-28 (×6): qty 1

## 2021-10-28 MED ORDER — OXYCODONE HCL 5 MG PO TABS: 5.0000 mg | ORAL_TABLET | ORAL | Status: DC | PRN

## 2021-10-28 MED ORDER — CHLORHEXIDINE GLUCONATE 0.12 % MT SOLN
15.0000 mL | Freq: Once | OROMUCOSAL | Status: AC
  Administered 2021-10-28: 15 mL via OROMUCOSAL

## 2021-10-28 MED ORDER — PROPOFOL 10 MG/ML IV BOLUS
INTRAVENOUS | Status: AC
  Filled 2021-10-28: qty 20

## 2021-10-28 MED ORDER — SODIUM CHLORIDE 0.9 % IV SOLN: 250.0000 mL | INTRAVENOUS | Status: DC

## 2021-10-28 MED ORDER — MORPHINE SULFATE (PF) 2 MG/ML IV SOLN
1.0000 mg | INTRAVENOUS | Status: DC | PRN
  Administered 2021-10-28: 2 mg via INTRAVENOUS

## 2021-10-28 MED ORDER — ASPIRIN EC 325 MG PO TBEC
325.0000 mg | DELAYED_RELEASE_TABLET | Freq: Every day | ORAL | Status: DC
  Administered 2021-10-29: 325 mg via ORAL
  Filled 2021-10-28: qty 1

## 2021-10-28 MED ORDER — LIDOCAINE 2% (20 MG/ML) 5 ML SYRINGE
INTRAMUSCULAR | Status: AC
  Filled 2021-10-28: qty 5

## 2021-10-28 MED ORDER — POTASSIUM CHLORIDE 10 MEQ/50ML IV SOLN: 10.0000 meq | INTRAVENOUS | Status: AC

## 2021-10-28 MED ORDER — ROCURONIUM BROMIDE 10 MG/ML (PF) SYRINGE
PREFILLED_SYRINGE | INTRAVENOUS | Status: AC
  Filled 2021-10-28: qty 20

## 2021-10-28 MED ORDER — DEXTROSE 50 % IV SOLN: 0.0000 mL | INTRAVENOUS | Status: DC | PRN

## 2021-10-28 MED ORDER — CHLORHEXIDINE GLUCONATE 4 % EX LIQD: 30.0000 mL | CUTANEOUS | Status: DC

## 2021-10-28 MED ORDER — IPRATROPIUM BROMIDE 0.02 % IN SOLN: 0.5000 mg | RESPIRATORY_TRACT | Status: DC | PRN

## 2021-10-28 MED ORDER — REVEFENACIN 175 MCG/3ML IN SOLN
175.0000 ug | Freq: Every day | RESPIRATORY_TRACT | Status: AC
  Administered 2021-10-28 – 2021-10-30 (×3): 175 ug via RESPIRATORY_TRACT
  Filled 2021-10-28 (×3): qty 3

## 2021-10-28 MED ORDER — HYDROMORPHONE HCL 1 MG/ML IJ SOLN
0.5000 mg | INTRAMUSCULAR | Status: DC | PRN
  Administered 2021-10-28 – 2021-11-01 (×6): 0.5 mg via INTRAVENOUS
  Filled 2021-10-28 (×5): qty 0.5

## 2021-10-28 MED ORDER — SODIUM CHLORIDE (PF) 0.9 % IJ SOLN
OROMUCOSAL | Status: DC | PRN
  Administered 2021-10-28 (×2): 4 mL via TOPICAL

## 2021-10-28 MED ORDER — DOBUTAMINE IN D5W 4-5 MG/ML-% IV SOLN: 0.0000 ug/kg/min | INTRAVENOUS | Status: DC

## 2021-10-28 MED ORDER — ACETAMINOPHEN 160 MG/5ML PO SOLN: 650.0000 mg | Freq: Once | ORAL | Status: AC

## 2021-10-28 MED ORDER — LEVALBUTEROL HCL 0.63 MG/3ML IN NEBU: 0.6300 mg | INHALATION_SOLUTION | RESPIRATORY_TRACT | Status: DC | PRN

## 2021-10-28 MED ORDER — ACETAMINOPHEN 500 MG PO TABS
1000.0000 mg | ORAL_TABLET | Freq: Four times a day (QID) | ORAL | Status: AC
  Administered 2021-10-29 – 2021-11-02 (×18): 1000 mg via ORAL
  Filled 2021-10-28 (×16): qty 2

## 2021-10-28 MED ORDER — NICARDIPINE HCL IN NACL 20-0.86 MG/200ML-% IV SOLN: 0.0000 mg/h | INTRAVENOUS | Status: DC

## 2021-10-28 MED ORDER — HYDROMORPHONE HCL 2 MG PO TABS
1.0000 mg | ORAL_TABLET | ORAL | Status: DC | PRN
Start: 2021-10-28 — End: 2021-11-04
  Administered 2021-10-29 – 2021-10-30 (×4): 4 mg via ORAL
  Administered 2021-11-03: 2 mg via ORAL
  Filled 2021-10-28: qty 2
  Filled 2021-10-28: qty 1
  Filled 2021-10-28 (×4): qty 2

## 2021-10-28 MED ORDER — MIDAZOLAM-SODIUM CHLORIDE 100-0.9 MG/100ML-% IV SOLN
0.0000 mg/h | INTRAVENOUS | Status: DC
  Administered 2021-10-28: 2 mg/h via INTRAVENOUS

## 2021-10-28 MED ORDER — HEPARIN SODIUM (PORCINE) 1000 UNIT/ML IJ SOLN
INTRAMUSCULAR | Status: AC
  Filled 2021-10-28: qty 1

## 2021-10-28 MED ORDER — METOPROLOL TARTRATE 12.5 MG HALF TABLET: 12.5000 mg | ORAL_TABLET | Freq: Once | ORAL | Status: DC

## 2021-10-28 MED ORDER — ~~LOC~~ CARDIAC SURGERY, PATIENT & FAMILY EDUCATION
Freq: Once | Status: DC
  Filled 2021-10-28: qty 1

## 2021-10-28 MED ORDER — PHENYLEPHRINE 40 MCG/ML (10ML) SYRINGE FOR IV PUSH (FOR BLOOD PRESSURE SUPPORT)
PREFILLED_SYRINGE | INTRAVENOUS | Status: DC | PRN
  Administered 2021-10-28: 80 ug via INTRAVENOUS
  Administered 2021-10-28 (×2): 240 ug via INTRAVENOUS
  Administered 2021-10-28: 80 ug via INTRAVENOUS
  Administered 2021-10-28: 240 ug via INTRAVENOUS
  Administered 2021-10-28: 40 ug via INTRAVENOUS

## 2021-10-28 MED ORDER — DOBUTAMINE INFUSION FOR EP/ECHO/NUC (1000 MCG/ML)
INTRAVENOUS | Status: DC | PRN
  Administered 2021-10-28: 2.5 ug/kg/min via INTRAVENOUS

## 2021-10-28 MED ORDER — METOPROLOL TARTRATE 5 MG/5ML IV SOLN: 2.5000 mg | INTRAVENOUS | Status: DC | PRN

## 2021-10-28 MED ORDER — CHLORHEXIDINE GLUCONATE CLOTH 2 % EX PADS
6.0000 | MEDICATED_PAD | Freq: Every day | CUTANEOUS | Status: DC
  Administered 2021-10-28 – 2021-11-03 (×7): 6 via TOPICAL

## 2021-10-28 MED ORDER — PROPOFOL 10 MG/ML IV BOLUS
INTRAVENOUS | Status: DC | PRN
  Administered 2021-10-28: 40 mg via INTRAVENOUS
  Administered 2021-10-28: 20 mg via INTRAVENOUS

## 2021-10-28 MED ORDER — 0.9 % SODIUM CHLORIDE (POUR BTL) OPTIME
TOPICAL | Status: DC | PRN
  Administered 2021-10-28: 1000 mL
  Administered 2021-10-28: 5000 mL

## 2021-10-28 MED ORDER — ALBUTEROL SULFATE HFA 108 (90 BASE) MCG/ACT IN AERS
INHALATION_SPRAY | RESPIRATORY_TRACT | Status: DC | PRN
  Administered 2021-10-28: 4 via RESPIRATORY_TRACT

## 2021-10-28 MED ORDER — LACTATED RINGERS IV SOLN: 500.0000 mL | Freq: Once | INTRAVENOUS | Status: DC | PRN

## 2021-10-28 MED ORDER — PROTAMINE SULFATE 10 MG/ML IV SOLN
INTRAVENOUS | Status: DC | PRN
  Administered 2021-10-28: 300 mg via INTRAVENOUS

## 2021-10-28 MED ORDER — PHENYLEPHRINE 40 MCG/ML (10ML) SYRINGE FOR IV PUSH (FOR BLOOD PRESSURE SUPPORT)
PREFILLED_SYRINGE | INTRAVENOUS | Status: AC
  Filled 2021-10-28: qty 20

## 2021-10-28 MED ORDER — ONDANSETRON HCL 4 MG/2ML IJ SOLN
4.0000 mg | Freq: Four times a day (QID) | INTRAMUSCULAR | Status: DC | PRN
  Administered 2021-10-29 – 2021-10-31 (×6): 4 mg via INTRAVENOUS
  Filled 2021-10-28 (×6): qty 2

## 2021-10-28 MED ORDER — BISACODYL 10 MG RE SUPP
10.0000 mg | Freq: Every day | RECTAL | Status: DC
  Administered 2021-10-31 – 2021-11-03 (×3): 10 mg via RECTAL
  Filled 2021-10-28 (×3): qty 1

## 2021-10-28 MED ORDER — METOPROLOL TARTRATE 25 MG/10 ML ORAL SUSPENSION: 12.5000 mg | Freq: Two times a day (BID) | ORAL | Status: DC

## 2021-10-28 MED ORDER — LEVALBUTEROL HCL 0.63 MG/3ML IN NEBU: 0.6300 mg | INHALATION_SOLUTION | Freq: Four times a day (QID) | RESPIRATORY_TRACT | Status: DC

## 2021-10-28 MED ORDER — LEVALBUTEROL HCL 0.63 MG/3ML IN NEBU
0.6300 mg | INHALATION_SOLUTION | Freq: Once | RESPIRATORY_TRACT | Status: AC
  Administered 2021-10-28: 0.63 mg via RESPIRATORY_TRACT

## 2021-10-28 MED ORDER — VASOPRESSIN 20 UNIT/ML IV SOLN
INTRAVENOUS | Status: DC | PRN
  Administered 2021-10-28: 1 [IU] via INTRAVENOUS

## 2021-10-28 MED ORDER — ALBUMIN HUMAN 5 % IV SOLN
250.0000 mL | INTRAVENOUS | Status: AC | PRN
  Administered 2021-10-28 (×3): 12.5 g via INTRAVENOUS

## 2021-10-28 MED ORDER — ASPIRIN 81 MG PO CHEW: 324.0000 mg | CHEWABLE_TABLET | Freq: Every day | ORAL | Status: DC

## 2021-10-28 SURGICAL SUPPLY — 104 items
ADAPTER CARDIO PERF ANTE/RETRO (ADAPTER) ×5 IMPLANT
ATRICLIP EXCLUSION VLAA 45 (Miscellaneous) ×5 IMPLANT
BAG DECANTER FOR FLEXI CONT (MISCELLANEOUS) ×5 IMPLANT
BLADE CLIPPER SURG (BLADE) ×5 IMPLANT
BLADE STERNUM SYSTEM 6 (BLADE) ×5 IMPLANT
BLADE SURG 15 STRL LF DISP TIS (BLADE) ×4 IMPLANT
BLADE SURG 15 STRL SS (BLADE) ×4
CANISTER SUCT 3000ML PPV (MISCELLANEOUS) ×5 IMPLANT
CANN PRFSN 3/8XCNCT ST RT ANG (MISCELLANEOUS) ×4
CANN PRFSN 3/8XRT ANG TPR 14 (MISCELLANEOUS) ×4
CANNULA AORTIC ROOT 9FR (CANNULA) ×5 IMPLANT
CANNULA GUNDRY RCSP 15FR (MISCELLANEOUS) ×5 IMPLANT
CANNULA MC2 2 STG 29/37 NON-V (CANNULA) ×4 IMPLANT
CANNULA MC2 TWO STAGE (CANNULA) ×5
CANNULA NON VENT 20FR 12 (CANNULA) ×5 IMPLANT
CANNULA NON VENT 22FR 12 (CANNULA) ×5 IMPLANT
CANNULA PRFSN 3/8XCNCT RT ANG (MISCELLANEOUS) ×4 IMPLANT
CANNULA PRFSN 3/8XRT ANG TPR14 (MISCELLANEOUS) ×4 IMPLANT
CANNULA SUMP PERICARDIAL (CANNULA) ×5 IMPLANT
CANNULA VEN MTL TIP RT (MISCELLANEOUS) ×8
CATH CPB KIT HENDRICKSON (MISCELLANEOUS) ×5 IMPLANT
CATH HEART VENT LEFT (CATHETERS) ×4 IMPLANT
CATH ROBINSON RED A/P 18FR (CATHETERS) ×15 IMPLANT
CLAMP ISOLATOR SYNERGY LG (MISCELLANEOUS) ×5 IMPLANT
CNTNR URN SCR LID CUP LEK RST (MISCELLANEOUS) ×4 IMPLANT
CONN 1/2X1/2X1/2  BEN (MISCELLANEOUS) ×4
CONN 1/2X1/2X1/2 BEN (MISCELLANEOUS) ×4 IMPLANT
CONN ST 1/2X1/2  BEN (MISCELLANEOUS) ×8
CONN ST 1/2X1/2 BEN (MISCELLANEOUS) ×8 IMPLANT
CONN ST 1/4X3/8  BEN (MISCELLANEOUS) ×10
CONN ST 1/4X3/8 BEN (MISCELLANEOUS) ×8 IMPLANT
CONN ST 3/8 X 1/2 (MISCELLANEOUS) ×10 IMPLANT
CONT SPEC 4OZ STRL OR WHT (MISCELLANEOUS) ×4
CONTAINER PROTECT SURGISLUSH (MISCELLANEOUS) ×10 IMPLANT
COVER SURGICAL LIGHT HANDLE (MISCELLANEOUS) ×5 IMPLANT
DEVICE EXCLUSIN ATRCLP VLAA 45 (Miscellaneous) ×4 IMPLANT
DEVICE SUT CK QUICK LOAD MINI (Prosthesis & Implant Heart) ×5 IMPLANT
DRAIN CHANNEL 28F RND 3/8 FF (WOUND CARE) ×10 IMPLANT
DRAPE CARDIOVASCULAR INCISE (DRAPES) ×3
DRAPE SRG 135X102X78XABS (DRAPES) ×4 IMPLANT
DRAPE WARM FLUID 44X44 (DRAPES) ×5 IMPLANT
DRESSING AQUACEL AG SP 3.5X10 (GAUZE/BANDAGES/DRESSINGS) ×4 IMPLANT
DRSG AQUACEL AG SP 3.5X10 (GAUZE/BANDAGES/DRESSINGS) ×5
DRSG COVADERM 4X14 (GAUZE/BANDAGES/DRESSINGS) ×5 IMPLANT
ELECT CAUTERY BLADE 6.4 (BLADE) ×5 IMPLANT
ELECT REM PT RETURN 9FT ADLT (ELECTROSURGICAL) ×10
ELECTRODE REM PT RTRN 9FT ADLT (ELECTROSURGICAL) ×8 IMPLANT
FELT TEFLON 1X6 (MISCELLANEOUS) ×10 IMPLANT
GAUZE 4X4 16PLY ~~LOC~~+RFID DBL (SPONGE) ×5 IMPLANT
GAUZE SPONGE 4X4 12PLY STRL (GAUZE/BANDAGES/DRESSINGS) ×5 IMPLANT
GAUZE SPONGE 4X4 12PLY STRL LF (GAUZE/BANDAGES/DRESSINGS) ×5 IMPLANT
GLOVE EUDERMIC 7 POWDERFREE (GLOVE) ×10 IMPLANT
GLOVE SURG ENC MOIS LTX SZ6 (GLOVE) IMPLANT
GLOVE SURG ENC MOIS LTX SZ6.5 (GLOVE) IMPLANT
GLOVE SURG ENC MOIS LTX SZ7 (GLOVE) ×15 IMPLANT
GLOVE SURG ENC MOIS LTX SZ7.5 (GLOVE) IMPLANT
GLOVE SURG MICRO LTX SZ6 (GLOVE) ×15 IMPLANT
GLOVE SURG MICRO LTX SZ6.5 (GLOVE) ×15 IMPLANT
GLOVE SURG MICRO LTX SZ7.5 (GLOVE) ×10 IMPLANT
GOWN STRL REUS W/ TWL LRG LVL3 (GOWN DISPOSABLE) ×40 IMPLANT
GOWN STRL REUS W/ TWL XL LVL3 (GOWN DISPOSABLE) ×8 IMPLANT
GOWN STRL REUS W/TWL LRG LVL3 (GOWN DISPOSABLE) ×40
GOWN STRL REUS W/TWL XL LVL3 (GOWN DISPOSABLE) ×6
GRAFT WOVEN D/V 28DX30L (Vascular Products) ×5 IMPLANT
HEMOSTAT POWDER SURGIFOAM 1G (HEMOSTASIS) ×15 IMPLANT
HEMOSTAT SURGICEL 2X14 (HEMOSTASIS) ×5 IMPLANT
INSERT FOGARTY XLG (MISCELLANEOUS) ×5 IMPLANT
INSERT SUTURE HOLDER (MISCELLANEOUS) ×5 IMPLANT
KIT BASIN OR (CUSTOM PROCEDURE TRAY) ×5 IMPLANT
KIT SUCTION CATH 14FR (SUCTIONS) ×10 IMPLANT
KIT SUT CK MINI COMBO 4X17 (Prosthesis & Implant Heart) ×5 IMPLANT
KIT TURNOVER KIT B (KITS) ×5 IMPLANT
LEAD PACING MYOCARDI (MISCELLANEOUS) ×10 IMPLANT
LINE VENT (MISCELLANEOUS) ×5 IMPLANT
LOOP VESSEL SUPERMAXI WHITE (MISCELLANEOUS) ×5 IMPLANT
NS IRRIG 1000ML POUR BTL (IV SOLUTION) ×30 IMPLANT
PACK E OPEN HEART (SUTURE) ×5 IMPLANT
PACK OPEN HEART (CUSTOM PROCEDURE TRAY) ×5 IMPLANT
PAD ARMBOARD 7.5X6 YLW CONV (MISCELLANEOUS) ×10 IMPLANT
POSITIONER HEAD DONUT 9IN (MISCELLANEOUS) ×5 IMPLANT
SET MPS 3-ND DEL (MISCELLANEOUS) ×5 IMPLANT
SPONGE T-LAP 18X18 ~~LOC~~+RFID (SPONGE) ×20 IMPLANT
SUT BONE WAX W31G (SUTURE) ×5 IMPLANT
SUT EB EXC GRN/WHT 2-0 V-5 (SUTURE) ×10 IMPLANT
SUT ETHIBOND 2 0 V5 (SUTURE) ×10 IMPLANT
SUT ETHIBOND X763 2 0 SH 1 (SUTURE) ×10 IMPLANT
SUT PROLENE 3 0 SH DA (SUTURE) ×10 IMPLANT
SUT PROLENE 3 0 SH1 36 (SUTURE) ×5 IMPLANT
SUT PROLENE 4 0 RB 1 (SUTURE) ×39
SUT PROLENE 4 0 SH DA (SUTURE) ×15 IMPLANT
SUT PROLENE 4-0 RB1 .5 CRCL 36 (SUTURE) ×52 IMPLANT
SUT STEEL 6MS V (SUTURE) IMPLANT
SUT STEEL STERNAL CCS#1 18IN (SUTURE) ×15 IMPLANT
SYSTEM SAHARA CHEST DRAIN ATS (WOUND CARE) ×5 IMPLANT
TAPE CLOTH SURG 4X10 WHT LF (GAUZE/BANDAGES/DRESSINGS) ×5 IMPLANT
TAPE PAPER 2X10 WHT MICROPORE (GAUZE/BANDAGES/DRESSINGS) ×5 IMPLANT
TOWEL GREEN STERILE (TOWEL DISPOSABLE) ×5 IMPLANT
TOWEL GREEN STERILE FF (TOWEL DISPOSABLE) ×5 IMPLANT
TRAY FOLEY SLVR 16FR TEMP STAT (SET/KITS/TRAYS/PACK) ×5 IMPLANT
TUBE SUCT INTRACARD DLP 20F (MISCELLANEOUS) ×5 IMPLANT
UNDERPAD 30X36 HEAVY ABSORB (UNDERPADS AND DIAPERS) ×5 IMPLANT
VALVE AORTIC SZ25 INSP/RESIL (Prosthesis & Implant Heart) ×5 IMPLANT
VENT LEFT HEART 12002 (CATHETERS) ×5
WATER STERILE IRR 1000ML POUR (IV SOLUTION) ×10 IMPLANT

## 2021-10-28 NOTE — Consult Note (Signed)
NAME:  Marcus Hicks, MRN:  [id number], DOB:  [birth year], LOS: 0 ADMISSION DATE:  10/28/2021, CONSULTATION DATE:  11/29 REFERRING MD:  Marcus Hicks, CHIEF COMPLAINT:  post-SAVR   History of Present Illness:  Marcus Hicks is a 55 y/o gentleman with a history of bicuspid aortic valve, moderate AS, Afib who was hospitalized in Sept 2022 for atrial fibrillation and was loaded on Tikosyn after unsuccessful cardioversion attempts. Due to QTc prolongation, Tikosyn was stopped. He continued to have difficult to control tachycardia and was referred for AVR, MAZE and LAA clipping.  LHC demonstrated nonobstructive CAD. Echo with LVEF 45-50%, mildly dilated atria bilaterally. His last dose of Eliquis was on 10/22/21.  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 He underwent bioprosthetic AVR, MAZE, LAAC today. He remains on low-dose dobutamine 2.32mcg/kg/min.   Pertinent  Medical History  Aortic stenosis Atrial fibrillation COPD, former tobacco abuse Nonobstructive CAD HFrEF, moderate PH Chronic HCV, Cirrhosis DM2 Obesity OSA  Significant Hospital Events: Including procedures, antibiotic start and stop dates in addition to other pertinent events   11/29 Bioprosthetic AVR, MAZE, LA appendage clipping.  Interim History / Subjective:    Objective   Blood pressure 111/64, pulse (!) 40, temperature 97.6 F (36.4 C), temperature source Oral, resp. rate 14, height 5\' 5"  (1.651 m), weight 101.6 kg, SpO2 100 %.    Vent Mode: SIMV;PRVC;PSV FiO2 (%):  [50 %] 50 % Set Rate:  [12 bmp-16 bmp] 16 bmp Vt Set:  [490 mL] 490 mL PEEP:  [5 cmH20] 5 cmH20 Pressure Support:  [10 cmH20] 10 cmH20   Intake/Output Summary (Last 24 hours) at 10/28/2021 1503 Last data filed at 10/28/2021 1416 Gross per 24 hour  Intake 4585 ml  Output 4718 ml  Net -133 ml   Filed Weights   10/28/21 [id number]  Weight: 101.6 kg    Examination: General: critically ill appeairng obese man lying in bed in NAD HENT:  Mild periorbital edema, [place]/AT, ETT in place Lungs: CTAB, synchronous with MV Cardiovascular: S1S2, RRR Abdomen: obese, soft Extremities: mild LE edema, no clubbing Neuro: under NMB, heavily sedated, not breathing over the vent GU: foley with red-tinged urine Derm: mild cyanosis on dorsal surface of hands, forearms but these areas are warm. No rashes.  CXR personally reviewed> ETT about 4cm above the carina. Minimal left effusion. CTA chest 11/3 personally reviewed> areas of mosaicism. Airway thickening.  CT abd pelvis> nodular contour of the liver suggesting cirrhosis  Resolved Hospital Problem list     Assessment & Plan:  Moderate AS with bicuspid aortic valve, s/p bioprosthetic AVR Chronic Afib, s/p MAZE and LAAC Chronic HFrEF -post-op care per protocol -hold AC for now given bleeding risk post-op -amiodarone for rate control; hold PTA metoprolol until off inotropic support -hold Entresto while ongoing shock -tele monitoring -chest tubes per TCTS -Peri-op antibiotics per protocol -Post-op pain control with dilaudid (oral & IV) & tramadol. Switched to dilaudid from oxycodone and morphine due to reliance on hepatic metabolism. Discussed with PharmD.  Acute respiratory failure post-operatively COPD OSA -Vent weaning per post-op protocol. RR increased based on ABG. -Will need CPAP QHS + PRN for naps once extubated. -VAP prevention protocol -pulmonary hygiene -Start yupelri and brovana. Can resume PTA Anoro once extubated.   DM, A1c 7.2 -hold PTA metformin -insulin gtt per protocol  HLD -con't statin Chronic anxiety -hold PTA xanax, can resume once extubated  Best Practice (right click and "Reselect all SmartList Selections" daily)   Diet/type:  NPO DVT prophylaxis: SCD GI prophylaxis: H2B Lines: Central line, Arterial Line, and yes and it is still needed Foley:  Yes, and it is still needed Code Status:  full code Last date of multidisciplinary goals of care  discussion [ ]   Labs   CBC: Recent Labs  Lab 10/27/21 1027 10/28/21 0823 10/28/21 1056 10/28/21 1105 10/28/21 1203 10/28/21 1206 10/28/21 1250 10/28/21 1333 10/28/21 1338  WBC 8.4  --   --   --   --   --   --   --   --   HGB 18.1*   < > 11.7*   < > 9.9* 11.2* 10.2* 10.9* 11.2*  HCT 53.5*   < > 34.0*   < > 29.0* 33.0* 30.0* 32.0* 33.0*  MCV 86.9  --   --   --   --   --   --   --   --   PLT 234  --  148*  --   --   --   --   --   --    < > = values in this interval not displayed.    Basic Metabolic Panel: Recent Labs  Lab 10/27/21 1027 10/28/21 0823 10/28/21 0901 10/28/21 0935 10/28/21 1018 10/28/21 1036 10/28/21 1109 10/28/21 1136 10/28/21 1203 10/28/21 1206 10/28/21 1250 10/28/21 1333 10/28/21 1338  NA 137   < > 138   < > 135   < > 137   < > 137 137 139 141 141  K 4.6   < > 3.9   < > 7.5*   < > 4.8   < > 4.9 4.9 4.2 4.0 4.0  CL 103   < > 101  --  105  --  100  --   --  100  --   --  103  CO2 24  --   --   --   --   --   --   --   --   --   --   --   --   GLUCOSE 157*   < > 140*  --  98  --  89  --   --  106*  --   --  116*  BUN 21*   < > 23*  --  23*  --  23*  --   --  21*  --   --  20  CREATININE 1.01   < > 0.70  --  0.70  --  0.70  --   --  0.60*  --   --  0.70  CALCIUM 9.8  --   --   --   --   --   --   --   --   --   --   --   --    < > = values in this interval not displayed.   GFR: Estimated Creatinine Clearance: 113 mL/min (by C-G formula based on SCr of 0.7 mg/dL). Recent Labs  Lab 10/27/21 1027  WBC 8.4    Liver Function Tests: Recent Labs  Lab 10/27/21 1027  AST 41  ALT 49*  ALKPHOS 49  BILITOT 0.7  PROT 7.4  ALBUMIN 4.0   No results for input(s): LIPASE, AMYLASE in the last 168 hours. No results for input(s): AMMONIA in the last 168 hours.  ABG    Component Value Date/Time   PHART 7.417 10/28/2021 1333   PCO2ART 46.5 10/28/2021 1333   PO2ART 540 (H) 10/28/2021 1333  HCO3 29.9 (H) 10/28/2021 1333   TCO2 29 10/28/2021 1338    O2SAT 100.0 10/28/2021 1333     Coagulation Profile: Recent Labs  Lab 10/27/21 1027  INR 1.1    Cardiac Enzymes: No results for input(s): CKTOTAL, CKMB, CKMBINDEX, TROPONINI in the last 168 hours.  HbA1C: Hgb A1c MFr Bld  Date/Time Value Ref Range Status  10/27/2021 10:28 AM 8.0 (H) 4.8 - 5.6 % Final    Comment:    (NOTE)         Prediabetes: 5.7 - 6.4         Diabetes: >6.4         Glycemic control for adults with diabetes: <7.0   08/26/2021 03:34 PM 7.2 (H) 4.8 - 5.6 % Final    Comment:             Prediabetes: 5.7 - 6.4          Diabetes: >6.4          Glycemic control for adults with diabetes: <7.0     CBG: Recent Labs  Lab 10/27/21 0958 10/28/21 0606  GLUCAP 138* 97    Review of Systems:   Unable to be performed due to intubated, sedated status.  Past Medical History:  He,  has a past medical history of Atrial fibrillation with rapid ventricular response (HCC) (08/12/2020), Bicuspid aortic valve, CHF (congestive heart failure) (HCC), Chronic hepatitis C virus infection (HCC) (08/03/2013), COPD (chronic obstructive pulmonary disease) (HCC), Coronary artery disease, Diabetes mellitus type 2, insulin dependent (HCC), Dysrhythmia, Essential hypertension (07/03/2015), HFrEF (heart failure with reduced ejection fraction) (HCC) (08/22/2020), Long-term insulin use (HCC) (10/12/2016), Obesity, unspecified (10/12/2016), and Sleep apnea.   Surgical History:   Past Surgical History:  Procedure Laterality Date   CARDIAC CATHETERIZATION     CARDIOVERSION N/A 09/24/2020   Procedure: CARDIOVERSION;  Surgeon: [name], MD;  Location: [facility] ENDOSCOPY;  Service: Cardiovascular;  Laterality: N/A;   CARDIOVERSION N/A 08/13/2021   Procedure: CARDIOVERSION;  Surgeon: [name], MD;  Location: [facility] ENDOSCOPY;  Service: Cardiovascular;  Laterality: N/A;   CCTA  08/2019   RIGHT/LEFT HEART CATH AND CORONARY ANGIOGRAPHY N/A 09/26/2021   Procedure: RIGHT/LEFT HEART  CATH AND CORONARY ANGIOGRAPHY;  Surgeon: [place], Peter M, MD;  Location: [facility] INVASIVE CV LAB;  Service: Cardiovascular;  Laterality: N/A;   TEE WITHOUT CARDIOVERSION  09/24/2020   Procedure: TRANSESOPHAGEAL ECHOCARDIOGRAM (TEE);  Surgeon: [name], MD;  Location: [facility] ENDOSCOPY;  Service: Cardiovascular;;     Social History:   reports that he has quit smoking. He has quit using smokeless tobacco.  His smokeless tobacco use included snuff. He reports that he does not currently use alcohol. He reports that he does not use drugs.   Family History:  His family history includes Alzheimer's disease in an other family member; Cancer in his maternal grandfather; Hypertension in an other family member; Liver disease in his sister; Memory loss in an other family member.   Allergies No Known Allergies   Home Medications  Prior to Admission medications   Medication Sig Start Date End Date Taking? Authorizing Provider  ELIQUIS 5 MG TABS tablet TAKE 1 TABLET(5 MG) BY MOUTH TWICE DAILY Patient taking differently: Take 5 mg by mouth 2 (two) times daily. TAKE 1 TABLET(5 MG) BY MOUTH TWICE DAILY 07/21/21  Yes Lanier Prude, MD  furosemide (LASIX) 20 MG tablet Take 1 tablet (20 mg total) by mouth daily. 09/26/21 09/26/22 Yes [place], Peter M, MD  metFORMIN (  GLUCOPHAGE) 500 MG tablet TAKE 1 TABLET(500 MG) BY MOUTH TWICE DAILY WITH A MEAL Patient taking differently: Take 500 mg by mouth 2 (two) times daily with a meal. TAKE 1 TABLET(500 MG) BY MOUTH TWICE DAILY WITH A MEAL 09/29/21  Yes [place], Peter M, MD  metoprolol succinate (TOPROL-XL) 100 MG 24 hr tablet Take 1 tablet (100 mg total) by mouth daily. Take with or immediately following a meal. 09/15/21  Yes Vickie Epley, MD  rosuvastatin (CRESTOR) 40 MG tablet Take 1 tablet (40 mg total) by mouth daily. 08/27/21  Yes Lillard Anes, MD  sacubitril-valsartan (ENTRESTO) 49-51 MG Take 1 tablet by mouth 2 (two) times daily. 01/23/21  Yes  Richardo Priest, MD  TRESIBA FLEXTOUCH 100 UNIT/ML FlexTouch Pen ADMINISTER 62 UNITS UNDER THE SKIN DAILY Patient taking differently: Inject 62 Units into the skin daily. 09/18/21  Yes Cox, Kirsten, MD  umeclidinium-vilanterol [facility] ELLIPTA) 62.5-25 MCG/INH AEPB Inhale 1 puff into the lungs every morning.   Yes [provider]  VICTOZA 18 MG/3ML SOPN ADMINISTER 1.2 MG UNDER THE SKIN DAILY Patient taking differently: Inject 1.2 mg into the skin every morning. 10/29/20  Yes Lillard Anes, MD  zolpidem (AMBIEN) 10 MG tablet TAKE 1 TABLET(10 MG) BY MOUTH AT BEDTIME AS NEEDED FOR SLEEP Patient taking differently: Take 10 mg by mouth at bedtime. 09/01/21  Yes Cox, Kirsten, MD  albuterol (PROVENTIL) (2.5 MG/3ML) 0.083% nebulizer solution USE 1 VIAL VIA NEBULIZER EVERY 6 HOURS AS NEEDED FOR WHEEZING OR SHORTNESS OF BREATH Patient taking differently: Take 2.5 mg by nebulization every 6 (six) hours as needed for wheezing. 08/12/21   Lillard Anes, MD  ALPRAZolam Duanne Moron) 0.5 MG tablet Take 1 tablet (0.5 mg total) by mouth 3 (three) times daily as needed for anxiety. 04/09/21   Lillard Anes, MD  traMADol (ULTRAM) 50 MG tablet Take 50 mg by mouth every 6 (six) hours as needed for severe pain.    [provider]     Critical care time: 45 min.    Julian Hy, DO 10/28/21 4:35 PM [facility]

## 2021-10-28 NOTE — Hospital Course (Addendum)
History of present illness:  Patient is a 55 year old male referred to Dr. Cliffton Asters in cardiothoracic surgical consultation due to bicuspid aortic valve with moderate aortic stenosis and persistent atrial fibrillation.  He has undergone complete cardiac evaluation and was recommended to proceed with aortic valve replacement and maze procedure.  Hospital course:  The patient was admitted for the procedure on 10/28/2021. He tolerated it well and was taken to the SICU in stable condition. Procedure: Aortic valve replacement with a 47mm Inspiris Reslia valve Aortic root enlargement  MAZE procedure.  Left atrial box lesion set, right atrial SVC, IVC and appendage ablation 67mm Atriclip placement   Post operative hospital course:  The patient was extubated on POD # 1.  He did have postoperative atrial fibrillation and EP assisted with management.  He has been placed on amiodarone.  He has been placed on Coumadin.  Early postoperatively he has required inotropic support including dobutamine and norepinephrine.  This is being weaned as tolerated. She does have an expected postoperative volume overload and will require diuretics but did have some early acute renal insufficiency.  He has developed acute renal insufficiency with gradually increasing creatinine.  It appears to have peaked at 2.96 and is downtrending.  He has been kept on renal dose dopamine.  PCCM has also been consulted to assist with medical management while in the ICU.  Creatinine has improved over time Lasix diuresis.  He has been placed on Coumadin with daily INR check.  He does have an expected acute blood loss anemia and values have stabilized.  He continued to make excellent progress overall.  Incisions are noted to be healing well without evidence of infection.  INR on 11/04/2021 was 3.1 with plans to be discharged on 2 mg of Coumadin and check INR in 2 days.  He has been weaned off of oxygen and maintains good saturations on room air.   He will be resumed on his preop diabetic medication.  He will need close follow-up as his most recent hemoglobin A1c is 8.0.  He is tolerating routine activities using standard cardiac rehab protocols.  At the time of discharge the patient is felt to be quite stable.

## 2021-10-28 NOTE — Transfer of Care (Signed)
Immediate Anesthesia Transfer of Care Note  Patient: Marcus Hicks  Procedure(s) Performed: AORTIC VALVE REPLACEMENT (AVR) WITH INSPIRIS RESILIA AORTIC VALVE SIZE (Chest) MAZE CLIPPING OF ATRIAL APPENDAGE WITH ATRICLIP ACHV45 TRANSESOPHAGEAL ECHOCARDIOGRAM (TEE) APPLICATION OF CELL SAVER AORTIC ROOT ENLARGEMENT WITH HEMASHIELD PLATINUM X 30CM (Chest)  Patient Location: SICU  Anesthesia Type:General  Level of Consciousness: Patient remains intubated per anesthesia plan  Airway & Oxygen Therapy: Patient remains intubated per anesthesia plan and Patient placed on Ventilator (see vital sign flow sheet for setting)  Post-op Assessment: Report given to RN and Post -op Vital signs reviewed and stable  Post vital signs: Reviewed and stable  Last Vitals:  Vitals Value Taken Time  BP 116/75 10/28/21 1434  Temp 37.4 C 10/28/21 1438  Pulse 95 10/28/21 1438  Resp 19 10/28/21 1438  SpO2 100 % 10/28/21 1438  Vitals shown include unvalidated device data.  Last Pain:  Vitals:   10/28/21 0634  TempSrc:   PainSc: 0-No pain         Complications: No notable events documented.

## 2021-10-28 NOTE — Brief Op Note (Signed)
10/28/2021  9:05 AM  PATIENT:  Marcus Hicks  55 y.o. male  PRE-OPERATIVE DIAGNOSIS:  AS  AFIB  POST-OPERATIVE DIAGNOSIS:  Aortic Stneosis, Atrail Fibrillation  PROCEDURE:  Procedure(s): AORTIC VALVE REPLACEMENT (AVR) WITH INSPIRIS RESILIA AORTIC VALVE SIZE (N/A) MAZE (N/A) CLIPPING OF ATRIAL APPENDAGE WITH ATRICLIP ACHV45 (N/A) TRANSESOPHAGEAL ECHOCARDIOGRAM (TEE) (N/A) APPLICATION OF CELL SAVER AORTIC ROOT ENLARGEMENT WITH HEMASHIELD PLATINUM X 30CM  SURGEON:  Surgeon(s) and Role:    * Lightfoot, Eliezer Lofts, MD - Primary  PHYSICIAN ASSISTANT: Errin Whitelaw PA-C  ASSISTANTS: STAFF   ANESTHESIA:   general  EBL:  1768 mL   BLOOD ADMINISTERED:none  DRAINS:  MEDIASTINAL CHEST TUBES    LOCAL MEDICATIONS USED:  NONE  SPECIMEN:  Source of Specimen:  AORTIC VALVE LEAFLETS  DISPOSITION OF SPECIMEN:  PATHOLOGY  COUNTS:  YES  TOURNIQUET:  * No tourniquets in log *  DICTATION: .Dragon Dictation  PLAN OF CARE: Admit to inpatient   PATIENT DISPOSITION:  ICU - intubated and hemodynamically stable.   Delay start of Pharmacological VTE agent (>24hrs) due to surgical blood loss or risk of bleeding: yes  COMPLICATIONS: NO KNOWN

## 2021-10-28 NOTE — Interval H&P Note (Signed)
History and Physical Interval Note:  10/28/2021 7:07 AM  Marcus Hicks  has presented today for surgery, with the diagnosis of AS  AFIB.  The various methods of treatment have been discussed with the patient and family. After consideration of risks, benefits and other options for treatment, the patient has consented to  Procedure(s): AORTIC VALVE REPLACEMENT (AVR) (N/A) MAZE (N/A) CLIPPING OF ATRIAL APPENDAGE (N/A) TRANSESOPHAGEAL ECHOCARDIOGRAM (TEE) (N/A) as a surgical intervention.  The patient's history has been reviewed, patient examined, no change in status, stable for surgery.  I have reviewed the patient's chart and labs.  Questions were answered to the patient's satisfaction.     Will plan for bioprosthetic valve  Marcus Hicks

## 2021-10-28 NOTE — Progress Notes (Signed)
  Echocardiogram Echocardiogram Transesophageal has been performed.  Augustine Radar 10/28/2021, 9:14 AM

## 2021-10-28 NOTE — Progress Notes (Addendum)
After vent wean, day shift RN took an ABG on patient and patient is still acidotic (see results review) and not within extubation parameters. Lightfoot MD was paged and notified. Per Cliffton Asters MD, plan is to keep patient sedated overnight and wait to extubate until morning. Received verbal orders from Lightfoot MD to start a versed gtt on patient as patient was wide awake earlier in the day while maxed out on Precedex gtt.

## 2021-10-28 NOTE — Progress Notes (Signed)
Earlier had increased WOB using abdominal accessory muscles, obstructed waveforms on the vent. Roxy Manns given with improvement in WOB and obstruction. Most recent ABG improved with increased RR and bronchodilators. OK to proceed with rapid wean protocol. CPAP ordered for overnight for OSA. Xopenex and atroven PRN ordered. D/w Dr. Cliffton Asters, RT, RN at bedside.  Steffanie Dunn, DO 10/28/21 6:57 PM [facility]

## 2021-10-28 NOTE — Anesthesia Procedure Notes (Signed)
Central Venous Catheter Insertion Performed by: Val Eagle, MD, anesthesiologist Start/End11/29/2022 7:47 AM, 10/28/2021 7:57 AM Patient location: OR. Preanesthetic checklist: patient identified, IV checked, site marked, risks and benefits discussed, surgical consent, monitors and equipment checked, pre-op evaluation, timeout performed and anesthesia consent Position: supine Hand hygiene performed  and maximum sterile barriers used  Catheter size: 8.5 Fr Total catheter length 16. Central line was placed.Sheath introducer Procedure performed using ultrasound guided technique. Ultrasound Notes:anatomy identified, needle tip was noted to be adjacent to the nerve/plexus identified, no ultrasound evidence of intravascular and/or intraneural injection and image(s) printed for medical record Attempts: 1 Following insertion, dressing applied, line sutured and Biopatch. Post procedure assessment: blood return through all ports and free fluid flow  Patient tolerated the procedure well with no immediate complications.

## 2021-10-28 NOTE — Anesthesia Procedure Notes (Addendum)
Procedure Name: Intubation Date/Time: 10/28/2021 7:53 AM Performed by: Lorie Phenix, CRNA Pre-anesthesia Checklist: Patient identified, Emergency Drugs available, Suction available and Patient being monitored Patient Re-evaluated:Patient Re-evaluated prior to induction Oxygen Delivery Method: Circle system utilized Preoxygenation: Pre-oxygenation with 100% oxygen Induction Type: IV induction Ventilation: Two handed mask ventilation required Laryngoscope Size: Mac and 4 Grade View: Grade I Tube type: Oral Tube size: 8.0 mm Number of attempts: 1 Airway Equipment and Method: Stylet and Oral airway Placement Confirmation: ETT inserted through vocal cords under direct vision, positive ETCO2 and breath sounds checked- equal and bilateral Secured at: 23 cm Tube secured with: Tape Dental Injury: Teeth and Oropharynx as per pre-operative assessment

## 2021-10-28 NOTE — Anesthesia Procedure Notes (Signed)
Central Venous Catheter Insertion Performed by: Val Eagle, MD, anesthesiologist Start/End11/29/2022 7:47 AM, 10/28/2021 7:57 AM Patient location: OR. Preanesthetic checklist: patient identified, IV checked, site marked, risks and benefits discussed, surgical consent, monitors and equipment checked, pre-op evaluation, timeout performed and anesthesia consent Hand hygiene performed  and maximum sterile barriers used  Central line was placed.Triple lumen Procedure performed without using ultrasound guided technique. Post procedure assessment: blood return through all ports and free fluid flow  Patient tolerated the procedure well with no immediate complications.

## 2021-10-28 NOTE — Op Note (Signed)
[address]       [name] [address]             [phone]                                           10/28/2021 Patient:  Marcus Hicks Pre-Op Dx: Atrial fibrillation   Bicuspid aortic valve with moderate aortic stenosis   Morbid obesity Post-op Dx: Same Procedure: Aortic valve replacement with a 25mm Inspiris Reslia valve Aortic root enlargement  MAZE procedure.  Left atrial box lesion set, right atrial SVC, IVC and appendage ablation 7mm Atriclip placement      Surgeon and Role:      * Muaaz Brau, Eliezer Lofts, MD - Primary    Webb Laws, PA-C  An experienced assistant was required given the complexity of this surgery and the standard of surgical care. The assistant was needed for exposure, dissection, suctioning, retraction of delicate tissues and sutures, instrument exchange and for overall help during this procedure.    Anesthesia  general EBL:  Blood Administration: none Xclamp Time:  116 min Pump Time:   Drains: 19 F blake drain: R, L, mediastinal  Wires: Atrial and ventricular Counts: correct   Indications: 55 year old male with bicuspid aortic valve, moderate aortic stenosis, and persistent atrial fibrillation.  He is still awaiting dental clearance.  He underwent a left heart cath which did not show any significant coronary disease and a CT chest which did not show any aneurysmal disease.  He remains short of breath, and is agreeable to proceed with surgery after undergoing his last 2 dental procedures.  He is tentatively scheduled for a bioprosthetic aortic valve replacement along with Maze procedure and left atrial clip.   Findings: Patient was in atrial fibrillation at the start of the case.  Upon completion the Maze procedure he did appear to be in sinus rhythm.  The aortic valve is bicuspid with fusion of the right and left coronary leaflets.  It was heavily calcified.  The annulus was also somewhat small.  We had to  divide the aorta all the way down to the annulus and repaired with a Gelweave patch.  After full debridement, a 25 mm aortic valve was sized to the annulus.  Operative Technique: All invasive lines were placed in pre-op holding.  After the risks, benefits and alternatives were thoroughly discussed, the patient was brought to the operative theatre.  Anesthesia was induced, and the patient was prepped and draped in normal sterile fashion.  An appropriate surgical pause was performed, and pre-operative antibiotics were dosed accordingly.  We began with an incision over the chest for the sternotomy.  This was carried down with bovie cautery, and the sternum was divided with a reciprocating saw.  Meticulous hemostasis was obtained.  The patient was systemically heparinized.   The sternal retractor was placed.  The pericardium was divided in the midline and fashioned into a cradle with pericardial stitches.   After we confirmed an appropriate ACT, the ascending aorta was cannulated in standard fashion.  Bicaval cannulation was performed in standard fashion.  Cardiopulmonary bypass was initiated and we began to cooled the patient to 32 degrees.  We began by developing our transverse sinus and a window posterior to the superior vena cava.  We also cleared on the window underneath the inferior vena cava  to the level of the diaphragm.  A red rubber catheter was passed through the transverse and oblique sinuses and underneath the superior vena cava.  Our atrial cure ablation clamp was then placed along the base of the left atrium to perform a box lesion set.  3 pairs of ablations were performed with good impedance.  The clamp was then removed.  We next sized the left atrial appendage and placed a 45 mm atrial clip along its base.  We then secured our ties around the superior and inferior vena cava and open the right atrium.  We then performed another ablation lesion set involving the superior vena cava, the inferior vena  cava, and the right atrial appendage.  3 pairs of ablations were performed for each.  The right atriotomy was then closed.  The cross clamp was applied, and a dose of anterograde cardioplegia was given with good arrest of the heart.  Our aortotomy was made and directed toward the non coronary cusp.  For adequate exposure we had to extend our aortotomy all the way down to the annulus.  The valve was inspected.  It was bicuspid and heavily calcified..  All leaflets were excised.  The annulus was also debrided of all calcifications.  The annulus was sized to a 25 mm Inspra's valve.  The left ventricle was then copiously irrigated.  Pledgeted mattress sutures were placed circumferentially through the annulus.  These sutures were then passed through the sewing ring of the valve.  Once the valve was seated in the annulus, it was secured with Core-knot sutures.  We secured a Hema shield patch to assist with her aortotomy closure.  We began to rewarm, and close our aortotomy in 2 layers.  A re-animation dose of cardioplegia was given.  After de-airing the heart, the aortic cross clamp was removed.  We checked our valve function, and for air using the TEE.  Once we were satisfying, we separated from cardiopulmonary bypass without event.    The heparin was reversed with protamine, and hemostasis was obtained.  Chest tubes and wires were placed, and the sternum was re-approximated with with sternal wires.  The soft tissue and skin were re-approximated wth absorbable suture.    The patient tolerated the procedure without any immediate complications, and was transferred to the ICU in guarded condition.  Marcus Hicks

## 2021-10-28 NOTE — Anesthesia Procedure Notes (Signed)
Arterial Line Insertion Start/End11/29/2022 7:00 AM, 10/28/2021 7:05 AM Performed by: CRNA  Patient location: Pre-op. Preanesthetic checklist: patient identified, IV checked, site marked, risks and benefits discussed, surgical consent, monitors and equipment checked, pre-op evaluation, timeout performed and anesthesia consent Lidocaine 1% used for infiltration Left, radial was placed Catheter size: 20 G Hand hygiene performed  and maximum sterile barriers used   Attempts: 1 Procedure performed without using ultrasound guided technique. Following insertion, dressing applied and Biopatch. Post procedure assessment: normal  Patient tolerated the procedure well with no immediate complications.

## 2021-10-29 ENCOUNTER — Other Ambulatory Visit (HOSPITAL_COMMUNITY): Payer: Self-pay

## 2021-10-29 ENCOUNTER — Inpatient Hospital Stay (HOSPITAL_COMMUNITY): Payer: 59

## 2021-10-29 DIAGNOSIS — Z9911 Dependence on respirator [ventilator] status: Secondary | ICD-10-CM | POA: Diagnosis not present

## 2021-10-29 DIAGNOSIS — J410 Simple chronic bronchitis: Secondary | ICD-10-CM | POA: Diagnosis not present

## 2021-10-29 DIAGNOSIS — Z952 Presence of prosthetic heart valve: Secondary | ICD-10-CM | POA: Diagnosis not present

## 2021-10-29 LAB — CBC
HCT: 36.2 % — ABNORMAL LOW (ref 39.0–52.0)
HCT: 35.1 % — ABNORMAL LOW (ref 39.0–52.0)
Hemoglobin: 12.3 g/dL — ABNORMAL LOW (ref 13.0–17.0)
Hemoglobin: 11.4 g/dL — ABNORMAL LOW (ref 13.0–17.0)
MCH: 29.9 pg (ref 26.0–34.0)
MCH: 29.2 pg (ref 26.0–34.0)
MCHC: 34 g/dL (ref 30.0–36.0)
MCHC: 32.5 g/dL (ref 30.0–36.0)
MCV: 87.9 fL (ref 80.0–100.0)
MCV: 90 fL (ref 80.0–100.0)
Platelets: 96 10*3/uL — ABNORMAL LOW (ref 150–400)
Platelets: 91 10*3/uL — ABNORMAL LOW (ref 150–400)
RBC: 4.12 MIL/uL — ABNORMAL LOW (ref 4.22–5.81)
RBC: 3.9 MIL/uL — ABNORMAL LOW (ref 4.22–5.81)
RDW: 12.6 % (ref 11.5–15.5)
RDW: 12.8 % (ref 11.5–15.5)
WBC: 12.9 10*3/uL — ABNORMAL HIGH (ref 4.0–10.5)
WBC: 15.6 10*3/uL — ABNORMAL HIGH (ref 4.0–10.5)
nRBC: 0 % (ref 0.0–0.2)
nRBC: 0 % (ref 0.0–0.2)

## 2021-10-29 LAB — BASIC METABOLIC PANEL
Anion gap: 5 (ref 5–15)
Anion gap: 6 (ref 5–15)
BUN: 18 mg/dL (ref 6–20)
BUN: 19 mg/dL (ref 6–20)
CO2: 26 mmol/L (ref 22–32)
CO2: 23 mmol/L (ref 22–32)
Calcium: 7.7 mg/dL — ABNORMAL LOW (ref 8.9–10.3)
Calcium: 6.8 mg/dL — ABNORMAL LOW (ref 8.9–10.3)
Chloride: 106 mmol/L (ref 98–111)
Chloride: 108 mmol/L (ref 98–111)
Creatinine, Ser: 1.15 mg/dL (ref 0.61–1.24)
Creatinine, Ser: 1.17 mg/dL (ref 0.61–1.24)
GFR, Estimated: >60 mL/min (ref >60)
GFR, Estimated: >60 mL/min (ref >60)
Glucose, Bld: 149 mg/dL — ABNORMAL HIGH (ref 70–99)
Glucose, Bld: 129 mg/dL — ABNORMAL HIGH (ref 70–99)
Potassium: 4.4 mmol/L (ref 3.5–5.1)
Potassium: 3.8 mmol/L (ref 3.5–5.1)
Sodium: 137 mmol/L (ref 135–145)
Sodium: 137 mmol/L (ref 135–145)

## 2021-10-29 LAB — GLUCOSE, CAPILLARY
Glucose-Capillary: 120 mg/dL — ABNORMAL HIGH (ref 70–99)
Glucose-Capillary: 121 mg/dL — ABNORMAL HIGH (ref 70–99)
Glucose-Capillary: 126 mg/dL — ABNORMAL HIGH (ref 70–99)
Glucose-Capillary: 129 mg/dL — ABNORMAL HIGH (ref 70–99)
Glucose-Capillary: 130 mg/dL — ABNORMAL HIGH (ref 70–99)
Glucose-Capillary: 136 mg/dL — ABNORMAL HIGH (ref 70–99)
Glucose-Capillary: 136 mg/dL — ABNORMAL HIGH (ref 70–99)
Glucose-Capillary: 140 mg/dL — ABNORMAL HIGH (ref 70–99)
Glucose-Capillary: 142 mg/dL — ABNORMAL HIGH (ref 70–99)
Glucose-Capillary: 144 mg/dL — ABNORMAL HIGH (ref 70–99)
Glucose-Capillary: 145 mg/dL — ABNORMAL HIGH (ref 70–99)
Glucose-Capillary: 145 mg/dL — ABNORMAL HIGH (ref 70–99)
Glucose-Capillary: 150 mg/dL — ABNORMAL HIGH (ref 70–99)
Glucose-Capillary: 154 mg/dL — ABNORMAL HIGH (ref 70–99)

## 2021-10-29 LAB — POCT I-STAT 7, (LYTES, BLD GAS, ICA,H+H)
Acid-base deficit: 1 mmol/L (ref 0.0–2.0)
Bicarbonate: 24 mmol/L (ref 20.0–28.0)
Calcium, Ion: 1.04 mmol/L — ABNORMAL LOW (ref 1.15–1.40)
HCT: 31 % — ABNORMAL LOW (ref 39.0–52.0)
Hemoglobin: 10.5 g/dL — ABNORMAL LOW (ref 13.0–17.0)
O2 Saturation: 100 %
Patient temperature: 37.8
Potassium: 3.8 mmol/L (ref 3.5–5.1)
Sodium: 143 mmol/L (ref 135–145)
TCO2: 25 mmol/L (ref 22–32)
pCO2 arterial: 41.6 mmHg (ref 32.0–48.0)
pH, Arterial: 7.373 (ref 7.350–7.450)
pO2, Arterial: 220 mmHg — ABNORMAL HIGH (ref 83.0–108.0)

## 2021-10-29 LAB — MAGNESIUM
Magnesium: 2.9 mg/dL — ABNORMAL HIGH (ref 1.7–2.4)
Magnesium: 2.4 mg/dL (ref 1.7–2.4)

## 2021-10-29 LAB — SURGICAL PATHOLOGY

## 2021-10-29 MED ORDER — FENTANYL CITRATE PF 50 MCG/ML IJ SOSY
PREFILLED_SYRINGE | INTRAMUSCULAR | Status: AC
  Administered 2021-10-29: 50 ug via INTRAVENOUS
  Filled 2021-10-29: qty 1

## 2021-10-29 MED ORDER — INSULIN ASPART 100 UNIT/ML IJ SOLN
2.0000 [IU] | INTRAMUSCULAR | Status: DC
Start: 2021-10-29 — End: 2021-10-30
  Administered 2021-10-29 – 2021-10-30 (×4): 2 [IU] via SUBCUTANEOUS

## 2021-10-29 MED ORDER — WARFARIN SODIUM 5 MG PO TABS
5.0000 mg | ORAL_TABLET | Freq: Every day | ORAL | Status: DC
  Administered 2021-10-29 – 2021-11-01 (×4): 5 mg via ORAL
  Filled 2021-10-29 (×4): qty 1

## 2021-10-29 MED ORDER — FENTANYL CITRATE PF 50 MCG/ML IJ SOSY: 50.0000 ug | PREFILLED_SYRINGE | Freq: Once | INTRAMUSCULAR | Status: AC

## 2021-10-29 MED ORDER — AMIODARONE HCL IN DEXTROSE 360-4.14 MG/200ML-% IV SOLN
30.0000 mg/h | INTRAVENOUS | Status: DC
  Administered 2021-10-29 – 2021-10-30 (×2): 30 mg/h via INTRAVENOUS
  Filled 2021-10-29 (×2): qty 200

## 2021-10-29 MED ORDER — ORAL CARE MOUTH RINSE
15.0000 mL | Freq: Two times a day (BID) | OROMUCOSAL | Status: DC
  Administered 2021-10-29 – 2021-11-04 (×12): 15 mL via OROMUCOSAL

## 2021-10-29 MED ORDER — WARFARIN - PHYSICIAN DOSING INPATIENT: Freq: Every day | Status: DC

## 2021-10-29 MED ORDER — INSULIN DETEMIR 100 UNIT/ML ~~LOC~~ SOLN
15.0000 [IU] | Freq: Two times a day (BID) | SUBCUTANEOUS | Status: DC
  Administered 2021-10-29 – 2021-11-04 (×13): 15 [IU] via SUBCUTANEOUS
  Filled 2021-10-29 (×14): qty 0.15

## 2021-10-29 MED ORDER — AMIODARONE HCL IN DEXTROSE 360-4.14 MG/200ML-% IV SOLN
60.0000 mg/h | INTRAVENOUS | Status: AC
  Administered 2021-10-29: 60 mg/h via INTRAVENOUS
  Filled 2021-10-29: qty 200

## 2021-10-29 MED ORDER — FENTANYL CITRATE PF 50 MCG/ML IJ SOSY
PREFILLED_SYRINGE | INTRAMUSCULAR | Status: AC
  Filled 2021-10-29: qty 1

## 2021-10-29 NOTE — Progress Notes (Signed)
  Alerted to patients admission by Dr. Cliffton Asters  1 Day Post-Op Procedure(s) (LRB): AORTIC VALVE REPLACEMENT (AVR) WITH INSPIRIS RESILIA AORTIC VALVE SIZE (N/A) MAZE (N/A) CLIPPING OF ATRIAL APPENDAGE WITH ATRICLIP ACHV45 (N/A) TRANSESOPHAGEAL ECHOCARDIOGRAM (TEE) (N/A) APPLICATION OF CELL SAVER AORTIC ROOT ENLARGEMENT WITH HEMASHIELD PLATINUM X 30CM  Noted to have post op AF this am. Amiodarone increased to 60 mg/hr.  Rates have steadily improved throughout the am, currently in 90s.  Plans noted to start coumadin this evening.  Dr. Lalla Brothers to see 10/30/21  Baldwin Crown" [place], PA-C  10/29/2021

## 2021-10-29 NOTE — Progress Notes (Signed)
[address]       [place] [address]             [phone]                 1 Day Post-Op Procedure(s) (LRB): AORTIC VALVE REPLACEMENT (AVR) WITH INSPIRIS RESILIA AORTIC VALVE SIZE 25MM (N/A) MAZE (N/A) CLIPPING OF ATRIAL APPENDAGE WITH ATRICLIP ACHV45 (N/A) TRANSESOPHAGEAL ECHOCARDIOGRAM (TEE) (N/A) APPLICATION OF CELL SAVER AORTIC ROOT ENLARGEMENT WITH HEMASHIELD PLATINUM 28MM X 30CM   Events: Afib this am Agitation during SBT _______________________________________________________________ Vitals: BP (!) 127/59   Pulse (!) 107   Temp 100.2 F (37.9 C)   Resp 20   Ht 5\' 5"  (1.651 m)   Wt 109.9 kg   SpO2 97%   BMI 40.32 kg/m  Filed Weights   10/28/21 0608 10/29/21 0339  Weight: 101.6 kg 109.9 kg     - Neuro: sedated  - Cardiovascular: afib   Drips: amio 60, dob 2.5, levo 2.   CVP:  [8 mmHg-31 mmHg] 13 mmHg  - Pulm:  Vent Mode: SIMV;PRVC FiO2 (%):  [40 %-50 %] 40 % Set Rate:  [4 bmp-20 bmp] 20 bmp Vt Set:  [490 mL-520 mL] 520 mL PEEP:  [5 cmH20-8 cmH20] 8 cmH20 Pressure Support:  [10 cmH20] 10 cmH20 Plateau Pressure:  [19 cmH20-21 cmH20] 19 cmH20  ABG    Component Value Date/Time   PHART 7.336 (L) 10/28/2021 2332   PCO2ART 42.9 10/28/2021 2332   PO2ART 76 (L) 10/28/2021 2332   HCO3 22.7 10/28/2021 2332   TCO2 24 10/28/2021 2332   ACIDBASEDEF 3.0 (H) 10/28/2021 2332   O2SAT 93.0 10/28/2021 2332    - Abd: ND - Extremity: warm  .Intake/Output      11/29 0701 11/30 0700 11/30 0701 12/01 0700   I.V. (mL/kg) 3894.3 (35.4)    Blood 1385    Other 0    NG/GT 0    IV Piggyback 986.7    Total Intake(mL/kg) 6266 (57)    Urine (mL/kg/hr) 3980 (1.5)    Emesis/NG output 0    Blood 1768    Chest Tube 490    Total Output 6238    Net +28            _______________________________________________________________ Labs: CBC Latest Ref Rng & Units 10/29/2021 10/28/2021 10/28/2021  WBC 4.0 - 10.5 K/uL 12.9(H) - 16.4(H)   Hemoglobin 13.0 - 17.0 g/dL 12.3(L) 11.9(L) 13.1  Hematocrit 39.0 - 52.0 % 36.2(L) 35.0(L) 39.8  Platelets 150 - 400 K/uL 96(L) - 113(L)   CMP Latest Ref Rng & Units 10/29/2021 10/28/2021 10/28/2021  Glucose 70 - 99 mg/dL 149(H) - 137(H)  BUN 6 - 20 mg/dL 18 - 19  Creatinine 0.61 - 1.24 mg/dL 1.15 - 1.24  Sodium 135 - 145 mmol/L 137 143 138  Potassium 3.5 - 5.1 mmol/L 4.4 4.3 5.2(H)  Chloride 98 - 111 mmol/L 106 - 108  CO2 22 - 32 mmol/L 26 - 24  Calcium 8.9 - 10.3 mg/dL 7.7(L) - 7.2(L)  Total Protein 6.5 - 8.1 g/dL - - -  Total Bilirubin 0.3 - 1.2 mg/dL - - -  Alkaline Phos 38 - 126 U/L - - -  AST 15 - 41 U/L - - -  ALT 0 - 44 U/L - - -    CXR: PV congestion  _______________________________________________________________  Assessment and Plan: POD 1 s/p AVR, root enlargment, MAZE, atriclip  Neuro: weaning sedation. CV: increased  amio to 60.  Will start coumadin tonight.   Pulm: wean to extubate Renal: creat stable.  Good uop GI: diet once extubated Heme: stable ID: afebrile Endo: SSI Dispo: continue ICU care   Corliss Skains 10/29/2021 7:24 AM

## 2021-10-29 NOTE — Progress Notes (Signed)
Pt declined CPAP tonight. °

## 2021-10-29 NOTE — Procedures (Signed)
Extubation Procedure Note  Patient Details:   Name: Marcus Hicks DOB: [birth year] MRN: [id number]   Airway Documentation:    Vent end date: 10/29/21 Vent end time: 0835   Evaluation  O2 sats: stable throughout Complications: No apparent complications Patient did tolerate procedure well. Bilateral Breath Sounds: Clear, Diminished, Fine crackles   Yes  Positive cuff leak noted. NIF - 28, VC 750.  Patient placed on Bipap 12/6 at 40% per Dr. Chestine Spore.  No stridor noted.  Forest Becker Hrithik Boschee 10/29/2021, 9:02 AM

## 2021-10-29 NOTE — Progress Notes (Signed)
NAME:  Marcus Hicks, MRN:  [id number], DOB:  1966-06-28, LOS: 1 ADMISSION DATE:  10/28/2021, CONSULTATION DATE:  11/29 REFERRING MD:  Cliffton Asters, CHIEF COMPLAINT:  post-SAVR   History of Present Illness:  Mr. Marcus Hicks is a 55 y/o gentleman with a history of bicuspid aortic valve, moderate AS, Afib who was hospitalized in Sept 2022 for atrial fibrillation and was loaded on Tikosyn after unsuccessful cardioversion attempts. Due to QTc prolongation, Tikosyn was stopped. He continued to have difficult to control tachycardia and was referred for AVR, MAZE and LAA clipping.  LHC demonstrated nonobstructive CAD. Echo with LVEF 45-50%, mildly dilated atria bilaterally. His last dose of Eliquis was on 10/22/21.  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 He underwent bioprosthetic AVR, MAZE, LAAC on 11/29. He remains on low-dose dobutamine 2.106mcg/kg/min.   He is POD 1 and remains critically ill in the [facility] ICU.  Pertinent  Medical History  Aortic stenosis Atrial fibrillation COPD, former tobacco abuse Nonobstructive CAD HFrEF, moderate PH Chronic HCV, Cirrhosis DM2 Obesity OSA  Significant Hospital Events: Including procedures, antibiotic start and stop dates in addition to other pertinent events   11/29 Bioprosthetic AVR, MAZE, LA appendage clipping. Remained intubated overnight due to anxiety. Off vent protocol.  Interim History / Subjective:  Precedex 0.6, insulin gtt, dobutamine 2.5, levophed 2 mcg, amio 60  Intubated  TMAX 100.9  3.9L UOP, +28 ML admission, 490 out of chest tube  Unable to obtain subjective evaluation due to patient status  Objective   Blood pressure (!) 99/42, pulse 83, temperature 100 F (37.8 C), resp. rate 16, height 5\' 5"  (1.651 m), weight 109.9 kg, SpO2 100 %. CVP:  [8 mmHg-31 mmHg] 21 mmHg  Vent Mode: PSV;CPAP FiO2 (%):  [40 %-50 %] 40 % Set Rate:  [4 bmp-20 bmp] 20 bmp Vt Set:  [490 mL-520 mL] 520 mL PEEP:  [5 cmH20-8 cmH20] 8  cmH20 Pressure Support:  [8 cmH20-10 cmH20] 8 cmH20 Plateau Pressure:  [19 cmH20-21 cmH20] 19 cmH20   Intake/Output Summary (Last 24 hours) at 10/29/2021 0825 Last data filed at 10/29/2021 0601 Gross per 24 hour  Intake 6266.01 ml  Output 6238 ml  Net 28.01 ml   Filed Weights   10/28/21 0608 10/29/21 0339  Weight: 101.6 kg 109.9 kg    Examination: General: In bed, NAD, appears comfortable HEENT: MM pink/moist, anicteric, atraumatic Neuro: RASS -1, PERRL 102mm, moves all extremities CV: S1S2, Afib, no m/r/g appreciated, CT with no AL to -20 PULM:  clear in the upper lobes, clear in the lower lobes, trachea midline, chest expansion symmetric GI: soft, bsx4 hypoactive, non-tender   Extremities: cool/dry, no pretibial edema, capillary refill less than 3 seconds  Skin:  no rashes or lesions noted  Resolved Hospital Problem list     Assessment & Plan:  Moderate AS with bicuspid aortic valve, s/p bioprosthetic AVR Chronic Afib, s/p MAZE and LAAC Chronic HFrEF Afib overnight. Remains on levophed and dobutamine -Post op care per protocol -Continue amiodarone for rate control. Amio increased to 60mg /hr overnight by TCTS. EP consulted. Recommend holding on BB while on inotropes. -AC per TCTS. Warfarin per pharmacy. Starting in afternoon. -Continue holding entresto -Chest tubes per TCTS -Continue pain control with hydromorphone and tramadol due to reliance on hepatic metabolism  -Post op abx per protocol  Acute respiratory failure post-operatively COPD OSA AM ABG 7.37/41/220/24. CXR with stable lines tubes. -Hope to extubate today to bipap -Continue low dose precedex post extubation -  Continue VAP prevention protocol -Pulmonary toilet as able -Continue xopenex and atrovent PRN. Contunue scheduled yupelri and brovana  -Will need CPAP QHS+PRN while asleep and with naps  DM, A1c 7.2 -Remains on insulin gtt -Continue holding home metformin -Transition to SSI per protocol    HLD -Continue anxiety  Chronic anxiety Was on versed gtt overnight.  -Continue holding PTA xanax, will evaluate for resumption in afternoon  Acute blood loss anemia HGB 11.9>12.3 -Transfuse PRBC if HBG less than 7 -Obtain AM CBC to trend H&H -Monitor for signs of bleeding    Best Practice (right click and "Reselect all SmartList Selections" daily)   Diet/type: NPO DVT prophylaxis: SCD GI prophylaxis: H2B Lines: Central line, Arterial Line, and yes and it is still needed Foley:  Yes, and it is still needed Code Status:  full code Last date of multidisciplinary goals of care discussion [ per primary ]   Critical care time: 61 minutes    Redmond School., MSN, APRN, AGACNP-BC [facility]  10/29/2021 , 8:26 AM  Please see [email or web address] for pager details  If no response, please call [phone] After hours, please call Elink at [phone]

## 2021-10-29 NOTE — Progress Notes (Signed)
Pt awake and alert.    States surgery was the "weirdest thing he's ever been through"  HRs have continued to improve on IV amiodarone, now in 70-80s.  Pt remains on dobutamine and norepi. Weaning as tolerated per primary.   No complaints at this time.   Casimiro Needle [address]" [place], PA-C  10/29/2021 4:18 PM

## 2021-10-29 NOTE — TOC Benefit Eligibility Note (Signed)
Patient Advocate Encounter  Insurance verification completed.    The patient is currently admitted and upon discharge could be taking Farxiga 10 mg.  The current 30 day co-pay is, $0.00.   The patient is currently admitted and upon discharge could be taking Jardiance 10 mg.  The current 30 day co-pay is, $0.00.   The patient is insured through [employer]     Jabrea Kallstrom, CPhT Pharmacy Patient Advocate Specialist [facility] Patient Advocate Team Direct Number: [phone]  Fax: [phone]        

## 2021-10-29 NOTE — Progress Notes (Signed)
Patient ID: Ledarius Leeson, male   DOB: [birth year], 55 y.o.   MRN: [id number]   TCTS Evening Rounds:   Hemodynamically stable on dobut 2.5, NE 2 Atrial fib 80's on IV amio  Sats 96% on 3L [place]  Urine output good  CT output low  CBC    Component Value Date/Time   WBC 15.6 (H) 10/29/2021 1703   RBC 3.90 (L) 10/29/2021 1703   HGB 11.4 (L) 10/29/2021 1703   HGB 16.9 08/26/2021 1534   HCT 35.1 (L) 10/29/2021 1703   HCT 51.6 (H) 08/26/2021 1534   PLT 91 (L) 10/29/2021 1703   PLT 182 08/26/2021 1534   MCV 90.0 10/29/2021 1703   MCV 91 08/26/2021 1534   MCH 29.2 10/29/2021 1703   MCHC 32.5 10/29/2021 1703   RDW 12.8 10/29/2021 1703   RDW 12.2 08/26/2021 1534   LYMPHSABS 1,560 09/22/2021 1334   LYMPHSABS 1.5 08/26/2021 1534   MONOABS 0.9 06/01/2021 0124   EOSABS 120 09/22/2021 1334   EOSABS 0.3 08/26/2021 1534   BASOSABS 48 09/22/2021 1334   BASOSABS 0.1 08/26/2021 1534     BMET    Component Value Date/Time   NA 137 10/29/2021 1703   NA 139 09/23/2021 1418   K 3.8 10/29/2021 1703   CL 108 10/29/2021 1703   CO2 23 10/29/2021 1703   GLUCOSE 129 (H) 10/29/2021 1703   BUN 19 10/29/2021 1703   BUN 14 09/23/2021 1418   CREATININE 1.17 10/29/2021 1703   CALCIUM 6.8 (L) 10/29/2021 1703   EGFR 99 09/23/2021 1418   GFRNONAA >60 10/29/2021 1703     A/P:  Stable postop course. Continue current plans

## 2021-10-29 NOTE — Progress Notes (Signed)
68mL of versed gtt wasted in steri cycle with [place] Planas RN as witness. Unable to waste through pyxis, medication did not pop up.

## 2021-10-29 NOTE — Discharge Summary (Addendum)
[address]       [place] [address]             [phone]    Physician Discharge Summary  Patient ID: Marcus Hicks MRN: [id number] DOB/AGE: [birth year] 54 y.o.  Admit date: 10/28/2021 Discharge date: 11/04/2021  Admission Diagnoses:  Patient Active Problem List   Diagnosis Date Noted   S/P AVR (aortic valve replacement) and aortoplasty 10/28/2021   Aortic stenosis 09/26/2021   Long term current use of anticoagulant therapy 09/22/2021   Encounter for preoperative dental examination 09/22/2021   Caries 09/22/2021   Teeth missing 09/22/2021   Impacted third molar tooth 09/22/2021   Hyperactive gag reflex 09/22/2021   Bruxism (teeth grinding) 09/22/2021   Clinical xerostomia 09/22/2021   Accretions on teeth 09/22/2021   Chronic periodontitis 09/22/2021   Excessive dental attrition 09/22/2021   Diastema 09/22/2021   Incipient enamel caries 09/22/2021   Phobia of dental procedure 09/22/2021   Secondary hypercoagulable state ([place]) 08/11/2021   Acute kidney injury ([place]) 06/01/2021   COVID-19 virus infection 06/01/2021   Hypotension 06/01/2021   Routine general medical examination at a health care facility 01/28/2021   BMI 38.0-38.9,adult 11/26/2020   Sleep apnea    Persistent atrial fibrillation (HCC)    HFrEF (heart failure with reduced ejection fraction) ([place]) 08/22/2020   Atrial fibrillation with rapid ventricular response ([place]) 08/12/2020   Diabetes mellitus type 2, insulin dependent ([place])    Hyperlipidemia 10/12/2016   Long-term insulin use ([place]) 10/12/2016   Morbid obesity ([place]) 10/12/2016   Type 2 diabetes mellitus with diabetic polyneuropathy ([place]) 10/12/2016   Obesity, unspecified 10/12/2016   Bicuspid aortic valve 07/03/2015   Dyslipidemia 07/03/2015   Essential hypertension 07/03/2015   COPD (chronic obstructive pulmonary disease) ([place]) 08/14/2013   Chronic hepatitis C virus infection ([place]) 08/03/2013     Discharge  Diagnoses:  Patient Active Problem List   Diagnosis Date Noted   S/P AVR (aortic valve replacement) and aortoplasty 10/28/2021   Aortic stenosis 09/26/2021   Long term current use of anticoagulant therapy 09/22/2021   Encounter for preoperative dental examination 09/22/2021   Caries 09/22/2021   Teeth missing 09/22/2021   Impacted third molar tooth 09/22/2021   Hyperactive gag reflex 09/22/2021   Bruxism (teeth grinding) 09/22/2021   Clinical xerostomia 09/22/2021   Accretions on teeth 09/22/2021   Chronic periodontitis 09/22/2021   Excessive dental attrition 09/22/2021   Diastema 09/22/2021   Incipient enamel caries 09/22/2021   Phobia of dental procedure 09/22/2021   Secondary hypercoagulable state ([place]) 08/11/2021   Acute kidney injury ([place]) 06/01/2021   COVID-19 virus infection 06/01/2021   Hypotension 06/01/2021   Routine general medical examination at a health care facility 01/28/2021   BMI 38.0-38.9,adult 11/26/2020   Sleep apnea    Persistent atrial fibrillation (HCC)    HFrEF (heart failure with reduced ejection fraction) ([place]) 08/22/2020   Atrial fibrillation with rapid ventricular response ([place]) 08/12/2020   Diabetes mellitus type 2, insulin dependent ([place])    Hyperlipidemia 10/12/2016   Long-term insulin use ([place]) 10/12/2016   Morbid obesity ([place]) 10/12/2016   Type 2 diabetes mellitus with diabetic polyneuropathy ([place]) 10/12/2016   Obesity, unspecified 10/12/2016   Bicuspid aortic valve 07/03/2015   Dyslipidemia 07/03/2015   Essential hypertension 07/03/2015   COPD (chronic obstructive pulmonary disease) ([place]) 08/14/2013   Chronic hepatitis C virus infection ([place]) 08/03/2013     Discharged Condition: good  History of present illness:  Patient is  a 55 year old male referred to Dr. Kipp Brood in cardiothoracic surgical consultation due to bicuspid aortic valve with moderate aortic stenosis and persistent atrial fibrillation.  He has undergone complete cardiac  evaluation and was recommended to proceed with aortic valve replacement and maze procedure.  Hospital course:  The patient was admitted for the procedure on 10/28/2021. He tolerated it well and was taken to the SICU in stable condition. Procedure: Aortic valve replacement with a 45mm Inspiris Reslia valve Aortic root enlargement  MAZE procedure.  Left atrial box lesion set, right atrial SVC, IVC and appendage ablation 66mm Atriclip placement   Post operative hospital course:  The patient was extubated on POD # 1.  He did have postoperative atrial fibrillation and EP assisted with management.  He has been placed on amiodarone.  He has been placed on Coumadin.  Early postoperatively he has required inotropic support including dobutamine and norepinephrine.  This is being weaned as tolerated. She does have an expected postoperative volume overload and will require diuretics but did have some early acute renal insufficiency.  He has developed acute renal insufficiency with gradually increasing creatinine.  It appears to have peaked at 2.96 and is downtrending.  He has been kept on renal dose dopamine.  PCCM has also been consulted to assist with medical management while in the ICU.  Creatinine has improved over time Lasix diuresis.  He has been placed on Coumadin with daily INR check.  He does have an expected acute blood loss anemia and values have stabilized.  He continued to make excellent progress overall.  Incisions are noted to be healing well without evidence of infection.  INR on 11/04/2021 was 3.1 with plans to be discharged on 2 mg of Coumadin and check INR in 2 days.  He has been weaned off of oxygen and maintains good saturations on room air.  He will be resumed on his preop diabetic medication.  He will need close follow-up as his most recent hemoglobin A1c is 8.0.  He is tolerating routine activities using standard cardiac rehab protocols.  At the time of discharge the patient is felt to be  quite stable.  Consults: pulmonary/intensive care  Significant Diagnostic Studies:  DG Chest 2 View  Result Date: 10/27/2021 CLINICAL DATA:  Chronic shortness of breath. EXAM: CHEST - 2 VIEW COMPARISON:  CTA chest 10/02/2021; X-ray chest 06/01/2021. FINDINGS: The cardiomediastinal silhouette is within normal limits. The lungs are well inflated and clear. No pleural effusion or pneumothorax is identified. Bilateral nipple shadows are noted. No acute osseous abnormality is seen. IMPRESSION: No active cardiopulmonary disease. Electronically Signed   By: Logan Bores M.D.   On: 10/27/2021 10:49   DG Abd 1 View  Result Date: 11/01/2021 CLINICAL DATA:  Abdominal pain, possible ileus, initial encounter EXAM: ABDOMEN - 1 VIEW COMPARISON:  02/10/2017 FINDINGS: Scattered large and small bowel gas is noted. No obstructive changes are seen. No free air is noted. No bony abnormality is seen. IMPRESSION: No acute abnormality noted. Electronically Signed   By: Inez Catalina M.D.   On: 11/01/2021 20:27   US RENAL  Result Date: 10/31/2021 CLINICAL DATA:  Renal failure EXAM: RENAL / URINARY TRACT ULTRASOUND COMPLETE COMPARISON:  CT 06/01/2021 FINDINGS: Right Kidney: Renal measurements: 13.5 x 6 x 4.9 cm = volume: 208.4 mL. Echogenicity within normal limits. No mass or hydronephrosis visualized. Left Kidney: Renal measurements: 10.7 x 5.9 by 5.3 cm = volume: 173 mL. Echogenicity within normal limits. No mass or hydronephrosis visualized. Bladder: Urinary  bladder is empty.  Catheter is present in the bladder Other: Nodular liver contour consistent with hepatic cirrhosis. IMPRESSION: 1. Normal ultrasound appearance of the kidneys. 2. Hepatic cirrhosis Electronically Signed   By: Jasmine PangKim  Fujinaga M.D.   On: 10/31/2021 16:13   DG Chest Port 1 View  Result Date: 10/31/2021 CLINICAL DATA:  Chest tube, status post open heart surgery EXAM: PORTABLE CHEST 1 VIEW COMPARISON:  10/29/2021 FINDINGS: Interval endotracheal and  esophagogastric extubation. Right neck vascular catheter remains in position. Cardiomegaly status post median sternotomy with aortic valvular prosthesis and left atrial appendage clip. Left chest and mediastinal drainage tubes remain in position. No pneumothorax. Atelectasis of the left midlung with a layering left pleural effusion. Mild, diffuse interstitial opacity. No new or focal airspace opacity. IMPRESSION: 1. Interval endotracheal and esophagogastric extubation. 2. Cardiomegaly status post median sternotomy with aortic valvular prosthesis and left atrial appendage clip 3. Atelectasis of the left midlung with a layering left pleural effusion. No new or focal airspace opacity. 4. Mild edema. Electronically Signed   By: Jearld LeschAlex D Bibbey M.D.   On: 10/31/2021 09:00   DG Chest Port 1 View  Result Date: 10/29/2021 CLINICAL DATA:  Status post aortic valve replacement EXAM: PORTABLE CHEST 1 VIEW COMPARISON:  Chest radiograph 1 day prior FINDINGS: The endotracheal tube tip appears to terminate approximately 4.8 cm from the carina. A right IJ vascular catheter terminates in the lower SVC. The enteric catheter tip is off the field of view. The aortic valve replacement is stable. A probable mediastinal drain and left basilar chest tube are stable. The cardiomediastinal silhouette is grossly stable. Linear opacities projecting over the left upper lobe are unchanged likely reflecting atelectasis. Retrocardiac opacity has slightly worsened in the interim, most likely reflecting worsening atelectasis. There is a small right pleural effusion and probable left pleural effusion, not significantly changed. There is no focal consolidation on the right. There is no appreciable pneumothorax. The bones are stable. IMPRESSION: 1. Lines and support devices as above. 2. Worsening retrocardiac opacity likely reflecting atelectasis. 3. Small right pleural effusion and probable left pleural effusion, not significantly changed in size.  Electronically Signed   By: Lesia HausenPeter  Noone M.D.   On: 10/29/2021 08:49   DG Chest Port 1 View  Result Date: 10/28/2021 CLINICAL DATA:  Status post aortic valve repair. EXAM: PORTABLE CHEST 1 VIEW COMPARISON:  October 27, 2021. FINDINGS: Endotracheal and nasogastric tubes appear to be in grossly good position. Sternotomy wires are noted. Right internal jugular catheter is noted. Left basilar atelectasis is noted with probable small pleural effusion. Mild right basilar atelectasis is noted as well. Bony thorax is unremarkable. Left-sided chest tube is noted without pneumothorax. IMPRESSION: Endotracheal and nasogastric tubes appear to be in grossly good position. Mild bibasilar subsegmental atelectasis with probable small left pleural effusion. Left-sided chest tube without pneumothorax. Electronically Signed   By: Lupita RaiderJames  Green Jr M.D.   On: 10/28/2021 14:53   VAS US DOPPLER PRE CABG  Result Date: 10/27/2021 PREOPERATIVE VASCULAR EVALUATION Patient Name:  Cletus GashBANNER REID Dwight  Date of Exam:   10/27/2021 Medical Rec #: [id number]            Accession #:    [id number] Date of Birth: 05/25/1966            Patient Gender: M Patient Age:   [age] years Exam Location:  [facility] Procedure:      VAS US DOPPLER PRE CABG Referring Phys: HARRELL LIGHTFOOT --------------------------------------------------------------------------------  Indications:  Pre-AVR. Risk Factors:  Hyperlipidemia, Diabetes. Other Factors: Bicuspid aortic valve, persistent a-fib, aortic stenosis. Performing Technologist: Darlin Coco RDMS RVT  Examination Guidelines: A complete evaluation includes B-mode imaging, spectral Doppler, color Doppler, and power Doppler as needed of all accessible portions of each vessel. Bilateral testing is considered an integral part of a complete examination. Limited examinations for reoccurring indications may be performed as noted.  Right Carotid Findings:  +----------+--------+--------+--------+------------------+--------+           PSV cm/sEDV cm/sStenosisDescribe          Comments +----------+--------+--------+--------+------------------+--------+ CCA Prox  112     27                                         +----------+--------+--------+--------+------------------+--------+ CCA Distal60      17              focal and calcific         +----------+--------+--------+--------+------------------+--------+ ICA Prox  50      19      1-39%   calcific                   +----------+--------+--------+--------+------------------+--------+ ICA Distal80      29                                         +----------+--------+--------+--------+------------------+--------+ ECA       68      7                                          +----------+--------+--------+--------+------------------+--------+ +----------+--------+-------+----------------+------------+           PSV cm/sEDV cmsDescribe        Arm Pressure +----------+--------+-------+----------------+------------+ Subclavian63             Multiphasic, WNL             +----------+--------+-------+----------------+------------+ +---------+--------+--+--------+-+---------+ VertebralPSV cm/s33EDV cm/s9Antegrade +---------+--------+--+--------+-+---------+ Left Carotid Findings: +----------+--------+--------+--------+---------------------+--------+           PSV cm/sEDV cm/sStenosisDescribe             Comments +----------+--------+--------+--------+---------------------+--------+ CCA Prox  82      18                                            +----------+--------+--------+--------+---------------------+--------+ CCA Distal69      14              heterogenous and mild         +----------+--------+--------+--------+---------------------+--------+ ICA Prox  47      17                                             +----------+--------+--------+--------+---------------------+--------+ ICA Distal51      24                                            +----------+--------+--------+--------+---------------------+--------+  ECA       76      13                                            +----------+--------+--------+--------+---------------------+--------+  +----------+--------+--------+----------------+------------+ SubclavianPSV cm/sEDV cm/sDescribe        Arm Pressure +----------+--------+--------+----------------+------------+           101             Multiphasic, WNL             +----------+--------+--------+----------------+------------+ +---------+--------+--+--------+--+---------+ VertebralPSV cm/s38EDV cm/s12Antegrade +---------+--------+--+--------+--+---------+  Right Doppler Findings: +----------+--------+-----+---------+--------+ Site      PressureIndexDoppler  Comments +----------+--------+-----+---------+--------+ Subclavian126          triphasic         +----------+--------+-----+---------+--------+ Radial                 triphasic         +----------+--------+-----+---------+--------+ Ulnar                  triphasic         +----------+--------+-----+---------+--------+  Left Doppler Findings: +----------+--------+-----+---------+--------+ Site      PressureIndexDoppler  Comments +----------+--------+-----+---------+--------+ TO:[id number]          triphasic         +----------+--------+-----+---------+--------+ Radial                 triphasic         +----------+--------+-----+---------+--------+ Ulnar                  triphasic         +----------+--------+-----+---------+--------+  Summary: Right Carotid: Velocities in the right ICA are consistent with a 1-39% stenosis. Left Carotid: The extracranial vessels were near-normal with only minimal wall               thickening or plaque. Vertebrals:  Bilateral vertebral arteries  demonstrate antegrade flow. Subclavians: Normal flow hemodynamics were seen in bilateral subclavian              arteries. Right Upper Extremity: Doppler waveform obliterate with right radial compression. Doppler waveforms remain within normal limits with right ulnar compression. Left Upper Extremity: Doppler waveforms remain within normal limits with left radial compression. Doppler waveforms remain within normal limits with left ulnar compression.  Electronically signed by Orlie Pollen on 10/27/2021 at 10:14:52 AM.    Final      Treatments: surgery:  10/28/2021 Patient:  [name] Pre-Op Dx: Atrial fibrillation                         Bicuspid aortic valve with moderate aortic stenosis                         Morbid obesity Post-op Dx: Same Procedure: Aortic valve replacement with a 20mm Inspiris Reslia valve Aortic root enlargement  MAZE procedure.  Left atrial box lesion set, right atrial SVC, IVC and appendage ablation 66mm Atriclip placement        Surgeon and Role:      * Lightfoot, Lucile Crater, MD - Primary    Evonnie Pat, PA-C   Discharge Exam: Blood pressure (!) 173/80, pulse 80, temperature 98.1 F (36.7 C), temperature source Oral, resp. rate 11, height 5\' 5"  (1.651 m), weight 107 kg, SpO2 92 %.  General appearance: alert, cooperative, and no distress Heart: regular rate and rhythm, no rub, and no murmur Lungs: mildly dim in lower fields Abdomen: benign Extremities: minor edema Wound: incis healing well Discharge Medications:  The patient has been discharged on:   1.Beta Blocker:  Yes [place].Etienne   ]                              No   [   ]                              If No, reason:  2.Ace Inhibitor/ARB: Yes [   y]                                     No  [    ]                                     If No, reason:  3.Statin:   Yes [ y  ]                  No  [   ]                  If No, reason:  4.Ecasa:  Yes  [ y  ]                  No   [   ]                   If No, reason:  Patient had ACS upon admission:  Plavix/P2Y12 inhibitor: Yes [   ]                                      No  [ n  ]      Allergies as of 11/04/2021   No Known Allergies      Medication List     STOP taking these medications    Eliquis 5 MG Tabs tablet Generic drug: apixaban   metoprolol succinate 100 MG 24 hr tablet Commonly known as: TOPROL-XL   sacubitril-valsartan 49-51 MG Commonly known as: ENTRESTO   zolpidem 10 MG tablet Commonly known as: AMBIEN       TAKE these medications    albuterol (2.5 MG/3ML) 0.083% nebulizer solution Commonly known as: PROVENTIL Take 3 mLs (2.5 mg total) by nebulization every 6 (six) hours as needed for wheezing. What changed: See the new instructions.   ALPRAZolam 0.5 MG tablet Commonly known as: XANAX Take 1 tablet (0.5 mg total) by mouth 3 (three) times daily as needed for anxiety.   amiodarone 400 MG tablet Commonly known as: PACERONE Take 1 tablet (400 mg total) by mouth 2 (two) times daily. For 5 days, then once daily   Anoro Ellipta 62.5-25 MCG/ACT Aepb Generic drug: umeclidinium-vilanterol Inhale 1 puff into the lungs every morning.   aspirin 81 MG EC tablet Take 1 tablet (81 mg total) by mouth daily. Swallow whole.   furosemide 40 MG tablet Commonly known as: LASIX Take 1 tablet (40 mg total) by mouth daily. What changed:  medication strength how much to take   losartan 25 MG tablet Commonly known as: COZAAR Take 1 tablet (25 mg total) by mouth daily.   metFORMIN 500 MG tablet Commonly known as: GLUCOPHAGE Take 1 tablet (500 mg total) by mouth 2 (two) times daily with a meal. TAKE 1 TABLET(500 MG) BY MOUTH TWICE DAILY WITH A MEAL   metoprolol tartrate 25 MG tablet Commonly known as: LOPRESSOR Take 1 tablet (25 mg total) by mouth 2 (two) times daily.   potassium chloride SA 20 MEQ tablet Commonly known as: KLOR-CON M Take 2 tablets (40 mEq total) by mouth daily.   rosuvastatin 40  MG tablet Commonly known as: CRESTOR Take 1 tablet (40 mg total) by mouth daily.   traMADol 50 MG tablet Commonly known as: ULTRAM Take 1 tablet (50 mg total) by mouth every 6 (six) hours as needed for up to 7 days for moderate pain. What changed: reasons to take this   Tresiba FlexTouch 100 UNIT/ML FlexTouch Pen Generic drug: insulin degludec Inject 20 Units into the skin daily. What changed: See the new instructions.   Victoza 18 MG/3ML Sopn Generic drug: liraglutide Inject 1.2 mg into the skin every morning.   warfarin 2 MG tablet Commonly known as: Coumadin Take 1 tablet (2 mg total) by mouth daily. As directed by the coumadin clinic based on blood test(INR)        Follow-up Information     [facility] Follow up.   Specialty: Cardiology Why: See appointments as outlined below - you will follow up with Dr. Bettina Gavia on Wednesday Dec 10, 2021 at 8:20 AM and heart ultrasound on Wednesday Dec 17, 2021 at 9:15 AM. Please arrive 15 minutes early to each appointment time to get checked in. Contact information: [place] [phone]        [place] [facility] Follow up.   Specialty: Cardiology Why: [place] - a Coumadin clinic appointment has been made for you on Thursday Nov 06, 2021 at 2:30 PM. Please arrive 15 minutes early to check in. Notice this is in the [place] location (see address). Contact information: [address], [place] [phone]                Signed: John Giovanni, PA-C  11/04/2021, 8:30 AM

## 2021-10-30 ENCOUNTER — Encounter (HOSPITAL_COMMUNITY): Payer: Self-pay | Admitting: Thoracic Surgery (Cardiothoracic Vascular Surgery)

## 2021-10-30 DIAGNOSIS — I4819 Other persistent atrial fibrillation: Secondary | ICD-10-CM | POA: Diagnosis not present

## 2021-10-30 DIAGNOSIS — J9601 Acute respiratory failure with hypoxia: Secondary | ICD-10-CM

## 2021-10-30 DIAGNOSIS — D62 Acute posthemorrhagic anemia: Secondary | ICD-10-CM | POA: Diagnosis not present

## 2021-10-30 DIAGNOSIS — J441 Chronic obstructive pulmonary disease with (acute) exacerbation: Secondary | ICD-10-CM | POA: Diagnosis not present

## 2021-10-30 DIAGNOSIS — Z952 Presence of prosthetic heart valve: Secondary | ICD-10-CM | POA: Diagnosis not present

## 2021-10-30 LAB — MAGNESIUM: Magnesium: 2.8 mg/dL — ABNORMAL HIGH (ref 1.7–2.4)

## 2021-10-30 LAB — CBC
HCT: 34.9 % — ABNORMAL LOW (ref 39.0–52.0)
Hemoglobin: 11.2 g/dL — ABNORMAL LOW (ref 13.0–17.0)
MCH: 29.3 pg (ref 26.0–34.0)
MCHC: 32.1 g/dL (ref 30.0–36.0)
MCV: 91.4 fL (ref 80.0–100.0)
Platelets: 90 10*3/uL — ABNORMAL LOW (ref 150–400)
RBC: 3.82 MIL/uL — ABNORMAL LOW (ref 4.22–5.81)
RDW: 13 % (ref 11.5–15.5)
WBC: 16 10*3/uL — ABNORMAL HIGH (ref 4.0–10.5)
nRBC: 0 % (ref 0.0–0.2)

## 2021-10-30 LAB — PROTIME-INR
INR: 1.5 — ABNORMAL HIGH (ref 0.8–1.2)
Prothrombin Time: 17.7 seconds — ABNORMAL HIGH (ref 11.4–15.2)

## 2021-10-30 LAB — GLUCOSE, CAPILLARY
Glucose-Capillary: 116 mg/dL — ABNORMAL HIGH (ref 70–99)
Glucose-Capillary: 117 mg/dL — ABNORMAL HIGH (ref 70–99)
Glucose-Capillary: 122 mg/dL — ABNORMAL HIGH (ref 70–99)
Glucose-Capillary: 133 mg/dL — ABNORMAL HIGH (ref 70–99)
Glucose-Capillary: 138 mg/dL — ABNORMAL HIGH (ref 70–99)
Glucose-Capillary: 148 mg/dL — ABNORMAL HIGH (ref 70–99)
Glucose-Capillary: 157 mg/dL — ABNORMAL HIGH (ref 70–99)

## 2021-10-30 LAB — CREATININE, SERUM
Creatinine, Ser: 2.96 mg/dL — ABNORMAL HIGH (ref 0.61–1.24)
GFR, Estimated: 24 mL/min — ABNORMAL LOW (ref >60)

## 2021-10-30 LAB — BASIC METABOLIC PANEL
Anion gap: 6 (ref 5–15)
BUN: 29 mg/dL — ABNORMAL HIGH (ref 6–20)
CO2: 28 mmol/L (ref 22–32)
Calcium: 7.8 mg/dL — ABNORMAL LOW (ref 8.9–10.3)
Chloride: 100 mmol/L (ref 98–111)
Creatinine, Ser: 1.71 mg/dL — ABNORMAL HIGH (ref 0.61–1.24)
GFR, Estimated: 46 mL/min — ABNORMAL LOW (ref >60)
Glucose, Bld: 154 mg/dL — ABNORMAL HIGH (ref 70–99)
Potassium: 4.9 mmol/L (ref 3.5–5.1)
Sodium: 134 mmol/L — ABNORMAL LOW (ref 135–145)

## 2021-10-30 LAB — SODIUM: Sodium: 136 mmol/L (ref 135–145)

## 2021-10-30 LAB — SODIUM, URINE, RANDOM: Sodium, Ur: <10 mmol/L

## 2021-10-30 LAB — CREATININE, URINE, RANDOM: Creatinine, Urine: 141.22 mg/dL

## 2021-10-30 MED ORDER — INSULIN ASPART 100 UNIT/ML IJ SOLN
0.0000 [IU] | INTRAMUSCULAR | Status: DC
  Administered 2021-10-30 – 2021-11-01 (×6): 2 [IU] via SUBCUTANEOUS
  Administered 2021-11-02: 12:00:00 4 [IU] via SUBCUTANEOUS
  Administered 2021-11-02 – 2021-11-03 (×4): 2 [IU] via SUBCUTANEOUS

## 2021-10-30 MED ORDER — DOPAMINE-DEXTROSE 3.2-5 MG/ML-% IV SOLN
2.5000 ug/kg/min | INTRAVENOUS | Status: DC
  Administered 2021-10-30: 2.5 ug/kg/min via INTRAVENOUS
  Filled 2021-10-30: qty 250

## 2021-10-30 MED ORDER — ASPIRIN EC 81 MG PO TBEC
81.0000 mg | DELAYED_RELEASE_TABLET | Freq: Every day | ORAL | Status: DC
  Administered 2021-10-30 – 2021-11-04 (×6): 81 mg via ORAL
  Filled 2021-10-30 (×6): qty 1

## 2021-10-30 MED ORDER — ENOXAPARIN SODIUM 40 MG/0.4ML IJ SOSY
40.0000 mg | PREFILLED_SYRINGE | Freq: Every day | INTRAMUSCULAR | Status: DC
  Administered 2021-10-30: 40 mg via SUBCUTANEOUS
  Filled 2021-10-30: qty 0.4

## 2021-10-30 MED ORDER — SODIUM CHLORIDE 0.9 % IV SOLN
12.5000 mg | Freq: Four times a day (QID) | INTRAVENOUS | Status: DC | PRN
  Administered 2021-10-31: 12.5 mg via INTRAVENOUS
  Filled 2021-10-30 (×4): qty 0.5

## 2021-10-30 MED ORDER — UMECLIDINIUM-VILANTEROL 62.5-25 MCG/ACT IN AEPB
1.0000 | INHALATION_SPRAY | Freq: Every day | RESPIRATORY_TRACT | Status: DC
  Administered 2021-10-31 – 2021-11-04 (×5): 1 via RESPIRATORY_TRACT
  Filled 2021-10-30: qty 14

## 2021-10-30 MED ORDER — POLYETHYLENE GLYCOL 3350 17 G PO PACK
17.0000 g | PACK | Freq: Every day | ORAL | Status: DC
  Administered 2021-10-30 – 2021-11-04 (×6): 17 g via ORAL
  Filled 2021-10-30 (×6): qty 1

## 2021-10-30 MED ORDER — ALPRAZOLAM 0.5 MG PO TABS
0.5000 mg | ORAL_TABLET | Freq: Two times a day (BID) | ORAL | Status: DC | PRN
  Administered 2021-10-30 – 2021-11-04 (×7): 0.5 mg via ORAL
  Filled 2021-10-30 (×7): qty 1

## 2021-10-30 MED ORDER — AMIODARONE HCL 200 MG PO TABS
400.0000 mg | ORAL_TABLET | Freq: Two times a day (BID) | ORAL | Status: DC
  Administered 2021-10-30 – 2021-11-04 (×11): 400 mg via ORAL
  Filled 2021-10-30 (×11): qty 2

## 2021-10-30 MED FILL — Potassium Chloride Inj 2 mEq/ML: INTRAVENOUS | Qty: 40 | Status: AC

## 2021-10-30 MED FILL — Lidocaine HCl Local Preservative Free (PF) Inj 2%: INTRAMUSCULAR | Qty: 15 | Status: AC

## 2021-10-30 MED FILL — Heparin Sodium (Porcine) Inj 1000 Unit/ML: Qty: 1000 | Status: AC

## 2021-10-30 NOTE — Progress Notes (Signed)
Holding off on CPAP due to pt vomiting.

## 2021-10-30 NOTE — Progress Notes (Signed)
      [address]       [name] [address]             [phone]      POD # 2 AVR with root enlargement, MAZE, LAA CLIP  C/o nausea, denies incisional pain  BP (!) 141/87   Pulse 70   Temp 98 F (36.7 C) (Oral)   Resp 10   Ht 5\' 5"  (1.651 m)   Wt 106.9 kg   SpO2 94%   BMI 39.22 kg/m    Dobutamine stopped earlier today   Intake/Output Summary (Last 24 hours) at 10/30/2021 1817 Last data filed at 10/30/2021 1700 Gross per 24 hour  Intake 745.79 ml  Output 1215 ml  Net -469.21 ml   UO 20-90/ hr prior to Foley being removed  On exam abdomen distended with hypoactive BS, nontender  Creatinine 1.17- 1.71- 2.96 K pending  Will start low dose dopamine  14/11/2020 C. Viviann Spare, MD [facility] [phone]

## 2021-10-30 NOTE — Consult Note (Signed)
ELECTROPHYSIOLOGY CONSULT NOTE    Patient ID: Marcus Hicks MRN: [id number], DOB/AGE: 56/30/1967 56 y.o.  Admit date: 10/28/2021 Date of Consult: 10/30/2021  Primary Physician: Abigail Miyamoto, MD Primary Cardiologist: None  Electrophysiologist: Dr. Lalla Brothers  Referring Provider: Dr. Cliffton Asters  Patient Profile: Marcus Hicks is a 55 y.o. male with a history of persistent atrial fibrillation, bicuspid AV, Chronic Hep C, DM2, COPD, and OSA  who is being seen today for the evaluation of atrial fibrillation at the request of Dr. Cliffton Asters.  HPI:  Marcus Hicks is a 55 y.o. male with medical history as above, well known to EP team.  He previously had tikosyn admission in September. He failed cardioversion and had QTc prolongation so tikosyn was ultimately abandoned.  TCTS consulted at that time for possible AVR/MAZE/LAA clipping.  Pt presented 11/29 for AVR/MAZE/LAA as planned.   Stable post op course but noted to have persistent atrial fibrillation with RVR POD#1. IV amiodarone re-bolused with gradual improvement of his HR throughout the am. Coumadin restarted.   Plan to remove temp wires today for TCTS note  Pt in NAD. Hopeful to feel better s/p MAZE. Explained that post op atrial fibrillation can still occur while healing. Sore from surgery, Otherwise no complaint.  Past Medical History:  Diagnosis Date   Atrial fibrillation with rapid ventricular response (HCC) 08/12/2020   Bicuspid aortic valve    CHF (congestive heart failure) (HCC)    Chronic hepatitis C virus infection (HCC) 08/03/2013   COPD (chronic obstructive pulmonary disease) (HCC)    Coronary artery disease    Diabetes mellitus type 2, insulin dependent (HCC)    Dysrhythmia    A Fib   Essential hypertension 07/03/2015   Last Assessment & Plan:  Blood pressure is borderline high.  Advised to get better sleep, walk on a regular basis, decrease weight down to about 210 and maintain, and  eliminate tobacco.   HFrEF (heart failure with reduced ejection fraction) (HCC) 08/22/2020   Long-term insulin use (HCC) 10/12/2016   Obesity, unspecified 10/12/2016   Patient advised to lose down to about 210 pounds and maintained.  Regular walking program recommended.  He knows how to lose weight.   Sleep apnea      Surgical History:  Past Surgical History:  Procedure Laterality Date   AORTIC ROOT ENLARGEMENT  10/28/2021   Procedure: AORTIC ROOT ENLARGEMENT WITH HEMASHIELD PLATINUM X 30CM;  Surgeon: Corliss Skains, MD;  Location: [facility] OR;  Service: Open Heart Surgery;;   AORTIC VALVE REPLACEMENT N/A 10/28/2021   Procedure: AORTIC VALVE REPLACEMENT (AVR) WITH INSPIRIS RESILIA AORTIC VALVE SIZE ;  Surgeon: Corliss Skains, MD;  Location: [facility] OR;  Service: Open Heart Surgery;  Laterality: N/A;   CARDIAC CATHETERIZATION     CARDIOVERSION N/A 09/24/2020   Procedure: CARDIOVERSION;  Surgeon: [name], MD;  Location: [facility] ENDOSCOPY;  Service: Cardiovascular;  Laterality: N/A;   CARDIOVERSION N/A 08/13/2021   Procedure: CARDIOVERSION;  Surgeon: [name], MD;  Location: [facility] ENDOSCOPY;  Service: Cardiovascular;  Laterality: N/A;   CCTA  08/2019   CLIPPING OF ATRIAL APPENDAGE N/A 10/28/2021   Procedure: CLIPPING OF ATRIAL APPENDAGE WITH ATRICLIP ACHV45;  Surgeon: Corliss Skains, MD;  Location: [facility] OR;  Service: Open Heart Surgery;  Laterality: N/A;   MAZE N/A 10/28/2021   Procedure: MAZE;  Surgeon: Corliss Skains, MD;  Location: [facility] OR;  Service: Open Heart Surgery;  Laterality: N/A;   RIGHT/LEFT HEART CATH AND  CORONARY ANGIOGRAPHY N/A 09/26/2021   Procedure: RIGHT/LEFT HEART CATH AND CORONARY ANGIOGRAPHY;  Surgeon: Swazil[place], Peter M, MD;  Location: [facility] INVASIVE CV LAB;  Service: Cardiovascular;  Laterality: N/A;   TEE WITHOUT CARDIOVERSION  09/24/2020   Procedure: TRANSESOPHAGEAL ECHOCARDIOGRAM (TEE);  Surgeon: [name], MD;  Location:  [facility] ENDOSCOPY;  Service: Cardiovascular;;   TEE WITHOUT CARDIOVERSION N/A 10/28/2021   Procedure: TRANSESOPHAGEAL ECHOCARDIOGRAM (TEE);  Surgeon: Corliss Skains, MD;  Location: [facility] OR;  Service: Open Heart Surgery;  Laterality: N/A;     Medications Prior to Admission  Medication Sig Dispense Refill Last Dose   ELIQUIS 5 MG TABS tablet TAKE 1 TABLET(5 MG) BY MOUTH TWICE DAILY (Patient taking differently: Take 5 mg by mouth 2 (two) times daily. TAKE 1 TABLET(5 MG) BY MOUTH TWICE DAILY) 180 tablet 1 10/22/2021   furosemide (LASIX) 20 MG tablet Take 1 tablet (20 mg total) by mouth daily. 30 tablet 11 10/27/2021   metFORMIN (GLUCOPHAGE) 500 MG tablet TAKE 1 TABLET(500 MG) BY MOUTH TWICE DAILY WITH A MEAL (Patient taking differently: Take 500 mg by mouth 2 (two) times daily with a meal. TAKE 1 TABLET(500 MG) BY MOUTH TWICE DAILY WITH A MEAL) 180 tablet 2 Past Week   metoprolol succinate (TOPROL-XL) 100 MG 24 hr tablet Take 1 tablet (100 mg total) by mouth daily. Take with or immediately following a meal. 90 tablet 3 10/28/2021 at 0300   rosuvastatin (CRESTOR) 40 MG tablet Take 1 tablet (40 mg total) by mouth daily. 90 tablet 3 10/27/2021   sacubitril-valsartan (ENTRESTO) 49-51 MG Take 1 tablet by mouth 2 (two) times daily. 180 tablet 3 10/27/2021   TRESIBA FLEXTOUCH 100 UNIT/ML FlexTouch Pen ADMINISTER 62 UNITS UNDER THE SKIN DAILY (Patient taking differently: Inject 62 Units into the skin daily.) 18 mL 6 10/28/2021 at 0300   umeclidinium-vilanterol (ANORO ELLIPTA) 62.5-25 MCG/INH AEPB Inhale 1 puff into the lungs every morning.   Past Week   VICTOZA 18 MG/3ML SOPN ADMINISTER 1.2 MG UNDER THE SKIN DAILY (Patient taking differently: Inject 1.2 mg into the skin every morning.) 12 mL 6 10/27/2021   zolpidem (AMBIEN) 10 MG tablet TAKE 1 TABLET(10 MG) BY MOUTH AT BEDTIME AS NEEDED FOR SLEEP (Patient taking differently: Take 10 mg by mouth at bedtime.) 30 tablet 2 Past Month   albuterol (PROVENTIL) (2.5  MG/3ML) 0.083% nebulizer solution USE 1 VIAL VIA NEBULIZER EVERY 6 HOURS AS NEEDED FOR WHEEZING OR SHORTNESS OF BREATH (Patient taking differently: Take 2.5 mg by nebulization every 6 (six) hours as needed for wheezing.) 150 mL 3 More than a month   ALPRAZolam (XANAX) 0.5 MG tablet Take 1 tablet (0.5 mg total) by mouth 3 (three) times daily as needed for anxiety. 90 tablet 3 More than a month   traMADol (ULTRAM) 50 MG tablet Take 50 mg by mouth every 6 (six) hours as needed for severe pain.   More than a month    Inpatient Medications:   acetaminophen  1,000 mg Oral Q6H   Or   acetaminophen (TYLENOL) oral liquid 160 mg/5 mL  1,000 mg Per Tube Q6H   amiodarone  400 mg Oral BID   arformoterol  15 mcg Nebulization BID   aspirin EC  81 mg Oral Daily   bisacodyl  10 mg Oral Daily   Or   bisacodyl  10 mg Rectal Daily   Chlorhexidine Gluconate Cloth  6 each Topical Daily   docusate sodium  200 mg Oral Daily   enoxaparin (LOVENOX)  injection  40 mg Subcutaneous QHS   insulin aspart  0-24 Units Subcutaneous Q4H   insulin aspart  2-6 Units Subcutaneous Q4H   insulin detemir  15 Units Subcutaneous Q12H   mouth rinse  15 mL Mouth Rinse BID   metoprolol tartrate  12.5 mg Oral BID   pantoprazole  40 mg Oral Daily   revefenacin  175 mcg Nebulization Daily   sodium chloride flush  10-40 mL Intracatheter Q12H   sodium chloride flush  3 mL Intravenous Q12H   warfarin  5 mg Oral q1600   Warfarin - Physician Dosing Inpatient   Does not apply q1600    Allergies: No Known Allergies  Social History   Socioeconomic History   Marital status: Widowed    Spouse name: Not on file   Number of children: Not on file   Years of education: Not on file   Highest education level: Not on file  Occupational History   Not on file  Tobacco Use   Smoking status: Former   Smokeless tobacco: Former    Types: Snuff   Tobacco comments:    Former snuff 08/11/2021  Vaping Use   Vaping Use: Never used  Substance  and Sexual Activity   Alcohol use: Not Currently   Drug use: Never   Sexual activity: Not on file  Other Topics Concern   Not on file  Social History Narrative   Not on file   Social Determinants of Health   Financial Resource Strain: Not on file  Food Insecurity: Not on file  Transportation Needs: Not on file  Physical Activity: Not on file  Stress: Not on file  Social Connections: Not on file  Intimate Partner Violence: Not on file     Family History  Problem Relation Age of Onset   Cancer Maternal Grandfather    Liver disease Sister    Alzheimer's disease Other    Memory loss Other    Hypertension Other      Review of Systems: All other systems reviewed and are otherwise negative except as noted above.  Physical Exam: Vitals:   10/30/21 0700 10/30/21 0738 10/30/21 0800 10/30/21 0818  BP: 123/72  120/66   Pulse:  73    Resp: 10 12 13    Temp:    98.2 F (36.8 C)  TempSrc:    Oral  SpO2:  (!) 88% 96%   Weight:      Height:        GEN- NAD alert and oriented  HEENT: normocephalic, atraumatic; sclera clear, conjunctiva pink; hearing intact; oropharynx clear; neck supple Lungs- Slightly diminished, chest tubes remain.  Heart- Irregularly irregular rate and rhythm, no murmurs, rubs or gallops GI- soft, non-tender, non-distended, bowel sounds present Extremities- no clubbing, cyanosis, or edema; DP/PT/radial pulses 2+ bilaterally MS- no significant deformity or atrophy Skin- warm and dry, no rash or lesion Psych- euthymic mood, full affect Neuro- strength and sensation are intact  Labs:   Lab Results  Component Value Date   WBC 16.0 (H) 10/30/2021   HGB 11.2 (L) 10/30/2021   HCT 34.9 (L) 10/30/2021   MCV 91.4 10/30/2021   PLT 90 (L) 10/30/2021    Recent Labs  Lab 10/27/21 1027 10/28/21 0823 10/30/21 0440  NA 137   < > 134*  K 4.6   < > 4.9  CL 103   < > 100  CO2 24   < > 28  BUN 21*   < > 29*  CREATININE 1.01   < >  1.71*  CALCIUM 9.8   < > 7.8*   PROT 7.4  --   --   BILITOT 0.7  --   --   ALKPHOS 49  --   --   ALT 49*  --   --   AST 41  --   --   GLUCOSE 157*   < > 154*   < > = values in this interval not displayed.      Radiology/Studies: DG Chest 2 View  Result Date: 10/27/2021 CLINICAL DATA:  Chronic shortness of breath. EXAM: CHEST - 2 VIEW COMPARISON:  CTA chest 10/02/2021; X-ray chest 06/01/2021. FINDINGS: The cardiomediastinal silhouette is within normal limits. The lungs are well inflated and clear. No pleural effusion or pneumothorax is identified. Bilateral nipple shadows are noted. No acute osseous abnormality is seen. IMPRESSION: No active cardiopulmonary disease. Electronically Signed   By: Sebastian Ache M.D.   On: 10/27/2021 10:49   DG Chest Port 1 View  Result Date: 10/29/2021 CLINICAL DATA:  Status post aortic valve replacement EXAM: PORTABLE CHEST 1 VIEW COMPARISON:  Chest radiograph 1 day prior FINDINGS: The endotracheal tube tip appears to terminate approximately 4.8 cm from the carina. A right IJ vascular catheter terminates in the lower SVC. The enteric catheter tip is off the field of view. The aortic valve replacement is stable. A probable mediastinal drain and left basilar chest tube are stable. The cardiomediastinal silhouette is grossly stable. Linear opacities projecting over the left upper lobe are unchanged likely reflecting atelectasis. Retrocardiac opacity has slightly worsened in the interim, most likely reflecting worsening atelectasis. There is a small right pleural effusion and probable left pleural effusion, not significantly changed. There is no focal consolidation on the right. There is no appreciable pneumothorax. The bones are stable. IMPRESSION: 1. Lines and support devices as above. 2. Worsening retrocardiac opacity likely reflecting atelectasis. 3. Small right pleural effusion and probable left pleural effusion, not significantly changed in size. Electronically Signed   By: Lesia Hausen M.D.   On:  10/29/2021 08:49   DG Chest Port 1 View  Result Date: 10/28/2021 CLINICAL DATA:  Status post aortic valve repair. EXAM: PORTABLE CHEST 1 VIEW COMPARISON:  October 27, 2021. FINDINGS: Endotracheal and nasogastric tubes appear to be in grossly good position. Sternotomy wires are noted. Right internal jugular catheter is noted. Left basilar atelectasis is noted with probable small pleural effusion. Mild right basilar atelectasis is noted as well. Bony thorax is unremarkable. Left-sided chest tube is noted without pneumothorax. IMPRESSION: Endotracheal and nasogastric tubes appear to be in grossly good position. Mild bibasilar subsegmental atelectasis with probable small left pleural effusion. Left-sided chest tube without pneumothorax. Electronically Signed   By: Lupita Raider M.D.   On: 10/28/2021 14:53   CT ANGIO CHEST AORTA W/CM & OR WO/CM  Result Date: 10/02/2021 CLINICAL DATA:  Follow-up thoracic aortic aneurysm. EXAM: CT ANGIOGRAPHY CHEST WITH CONTRAST TECHNIQUE: Multidetector CT imaging of the chest was performed using the standard protocol during bolus administration of intravenous contrast. Multiplanar CT image reconstructions and MIPs were obtained to evaluate the vascular anatomy. CONTRAST:  75mL ISOVUE-370 IOPAMIDOL (ISOVUE-370) INJECTION 76% COMPARISON:  Cardiac CTA 05/16/2021. No remote CT angio of the chest 11/01/2013 was also reviewed. FINDINGS: Cardiovascular: The thoracic aorta is normal in caliber without evidence of aneurysmal dilatation. The aorta at the sinotubular junction measures 2.4 cm. The ascending aorta measures 3.1 cm in the midportion, 3 cm proximal to the great vessel takeoff. Transverse aorta measures 2.4  cm. The descending aorta measures 2.3 cm. There is minimal thoracic aortic atherosclerosis. No aortic dissection or acute aortic findings. The heart is normal in size. Coronary artery and aortic valvular calcifications, assessed on prior coronary CT. Trace pericardial fluid  anteriorly, similar in volume to prior. Exam not tailored for evaluation of the pulmonary arteries, allowing for this, no pulmonary arterial filling defects are seen. Mediastinum/Nodes: No enlarged mediastinal or hilar lymph nodes. No visualized thyroid nodule. There is no esophageal wall thickening. Lungs/Pleura: No focal airspace disease. There is central bronchial thickening with occasional areas of mucous plugging and minimal retained mucus in the left mainstem bronchus. The 3 mm right lower lobe pulmonary nodule described on prior cardiac CT is not seen on the current exam and has likely resolved. There is no new pulmonary nodule. No pleural fluid. No pulmonary mass. Upper Abdomen: Nodular hepatic contours suggestive of cirrhosis, unchanged. No acute findings. Musculoskeletal: There are no acute or suspicious osseous abnormalities. Review of the MIP images confirms the above findings. IMPRESSION: 1. Normal caliber thoracic aorta without aneurysmal dilatation or acute aortic findings. Mild aortic atherosclerosis. 2. Mild central bronchial thickening with occasional areas of mucous plugging and minimal retained mucus in the left mainstem bronchus. 3. The 3 mm right lower lobe pulmonary nodule described on prior cardiac CT is not seen on the current exam and has likely resolved. No new pulmonary nodule. Aortic Atherosclerosis (ICD10-I70.0). Electronically Signed   By: Narda Rutherford M.D.   On: 10/02/2021 16:15   VAS US DOPPLER PRE CABG  Result Date: 10/27/2021 PREOPERATIVE VASCULAR EVALUATION Patient Name:  KAMERAN MCNEESE  Date of Exam:   10/27/2021 Medical Rec #: [id number]            Accession #:    [id number] Date of Birth: 04-Oct-1966            Patient Gender: M Patient Age:   62 years Exam Location:  [facility] Procedure:      VAS US DOPPLER PRE CABG Referring Phys: HARRELL LIGHTFOOT --------------------------------------------------------------------------------  Indications:   Pre-AVR.  Risk Factors:  Hyperlipidemia, Diabetes. Other Factors: Bicuspid aortic valve, persistent a-fib, aortic stenosis. Performing Technologist: Jean Rosenthal RDMS RVT  Examination Guidelines: A complete evaluation includes B-mode imaging, spectral Doppler, color Doppler, and power Doppler as needed of all accessible portions of each vessel. Bilateral testing is considered an integral part of a complete examination. Limited examinations for reoccurring indications may be performed as noted.  Right Carotid Findings: +----------+--------+--------+--------+------------------+--------+           PSV cm/sEDV cm/sStenosisDescribe          Comments +----------+--------+--------+--------+------------------+--------+ CCA Prox  112     27                                         +----------+--------+--------+--------+------------------+--------+ CCA Distal60      17              focal and calcific         +----------+--------+--------+--------+------------------+--------+ ICA Prox  50      19      1-39%   calcific                   +----------+--------+--------+--------+------------------+--------+ ICA Distal80      29                                         +----------+--------+--------+--------+------------------+--------+  ECA       68      7                                          +----------+--------+--------+--------+------------------+--------+ +----------+--------+-------+----------------+------------+           PSV cm/sEDV cmsDescribe        Arm Pressure +----------+--------+-------+----------------+------------+ Subclavian63             Multiphasic, WNL             +----------+--------+-------+----------------+------------+ +---------+--------+--+--------+-+---------+ VertebralPSV cm/s33EDV cm/s9Antegrade +---------+--------+--+--------+-+---------+ Left Carotid Findings: +----------+--------+--------+--------+---------------------+--------+           PSV  cm/sEDV cm/sStenosisDescribe             Comments +----------+--------+--------+--------+---------------------+--------+ CCA Prox  82      18                                            +----------+--------+--------+--------+---------------------+--------+ CCA Distal69      14              heterogenous and mild         +----------+--------+--------+--------+---------------------+--------+ ICA Prox  47      17                                            +----------+--------+--------+--------+---------------------+--------+ ICA Distal51      24                                            +----------+--------+--------+--------+---------------------+--------+ ECA       76      13                                            +----------+--------+--------+--------+---------------------+--------+  +----------+--------+--------+----------------+------------+ SubclavianPSV cm/sEDV cm/sDescribe        Arm Pressure +----------+--------+--------+----------------+------------+           101             Multiphasic, WNL             +----------+--------+--------+----------------+------------+ +---------+--------+--+--------+--+---------+ VertebralPSV cm/s38EDV cm/s12Antegrade +---------+--------+--+--------+--+---------+  Right Doppler Findings: +----------+--------+-----+---------+--------+ Site      PressureIndexDoppler  Comments +----------+--------+-----+---------+--------+ [id number]          triphasic         +----------+--------+-----+---------+--------+ Radial                 triphasic         +----------+--------+-----+---------+--------+ Ulnar                  triphasic         +----------+--------+-----+---------+--------+  Left Doppler Findings: +----------+--------+-----+---------+--------+ Site      PressureIndexDoppler  Comments +----------+--------+-----+---------+--------+ [id number]          triphasic          +----------+--------+-----+---------+--------+ Radial                 triphasic         +----------+--------+-----+---------+--------+  Ulnar                  triphasic         +----------+--------+-----+---------+--------+  Summary: Right Carotid: Velocities in the right ICA are consistent with a 1-39% stenosis. Left Carotid: The extracranial vessels were near-normal with only minimal wall               thickening or plaque. Vertebrals:  Bilateral vertebral arteries demonstrate antegrade flow. Subclavians: Normal flow hemodynamics were seen in bilateral subclavian              arteries. Right Upper Extremity: Doppler waveform obliterate with right radial compression. Doppler waveforms remain within normal limits with right ulnar compression. Left Upper Extremity: Doppler waveforms remain within normal limits with left radial compression. Doppler waveforms remain within normal limits with left ulnar compression.  Electronically signed by Gerarda Fraction on 10/27/2021 at 10:14:52 AM.    Final     EKG:11/30 shows AF RVR at 115 (personally reviewed)  TELEMETRY: Rate controlled AF (personally reviewed)   Assessment/Plan: Persistent atrial fibrillation S/p MAZE and LAA clipping Currently in rate controlled AF post op Continue amiodarone Back on coumadin starting last night per TCTS.    2. Bicuspid aortic valve and severe aortic stenosis POD#2 AVR, root enlargement, MAZE, and LAA clip Post op course going well so far. Extubated, weaning pressors as tolerated.   Dr. Lalla Brothers to see this am.  For questions or updates, please contact [facility] Please consult [email or web address] for contact info under Cardiology/STEMI.  Dustin Flock, PA-C  10/30/2021 8:43 AM

## 2021-10-30 NOTE — Progress Notes (Signed)
      [address]       [place] [address]             [phone]                 2 Days Post-Op Procedure(s) (LRB): AORTIC VALVE REPLACEMENT (AVR) WITH INSPIRIS RESILIA AORTIC VALVE SIZE (N/A) MAZE (N/A) CLIPPING OF ATRIAL APPENDAGE WITH ATRICLIP ACHV45 (N/A) TRANSESOPHAGEAL ECHOCARDIOGRAM (TEE) (N/A) APPLICATION OF CELL SAVER AORTIC ROOT ENLARGEMENT WITH HEMASHIELD PLATINUM X 30CM   Events: Afib, but rate controlled  _______________________________________________________________ Vitals: BP 123/72   Pulse 74   Temp 98.6 F (37 C)   Resp 10   Ht 5\' 5"  (1.651 m)   Wt 109.9 kg   SpO2 94%   BMI 40.32 kg/m  Filed Weights   10/28/21 0608 10/29/21 0339  Weight: 101.6 kg 109.9 kg     - Neuro: alert NAD  - Cardiovascular: afib   Drips: amio 60, dob 2.5   CVP:  [16 mmHg-30 mmHg] 30 mmHg  - Pulm:  Coarse  ABG    Component Value Date/Time   PHART 7.373 10/29/2021 0755   PCO2ART 41.6 10/29/2021 0755   PO2ART 220 (H) 10/29/2021 0755   HCO3 24.0 10/29/2021 0755   TCO2 25 10/29/2021 0755   ACIDBASEDEF 1.0 10/29/2021 0755   O2SAT 100.0 10/29/2021 0755    - Abd: ND - Extremity: warm  .Intake/Output      11/30 0701 12/01 0700 12/01 0701 12/02 0700   I.V. (mL/kg) 1301.2 (11.8)    Blood     Other     NG/GT     IV Piggyback 200.3    Total Intake(mL/kg) 1501.6 (13.7)    Urine (mL/kg/hr) 935 (0.4)    Emesis/NG output     Blood     Chest Tube 280    Total Output 1215    Net +286.6            _______________________________________________________________ Labs: CBC Latest Ref Rng & Units 10/30/2021 10/29/2021 10/29/2021  WBC 4.0 - 10.5 K/uL 16.0(H) 15.6(H) -  Hemoglobin 13.0 - 17.0 g/dL 11.2(L) 11.4(L) 10.5(L)  Hematocrit 39.0 - 52.0 % 34.9(L) 35.1(L) 31.0(L)  Platelets 150 - 400 K/uL 90(L) 91(L) -   CMP Latest Ref Rng & Units 10/30/2021 10/29/2021 10/29/2021  Glucose 70 - 99 mg/dL 10/31/2021) 474(Q) -  BUN 6 - 20 mg/dL 595(G)  19 -  Creatinine 0.61 - 1.24 mg/dL 38(V) 5.64(P -  Sodium 135 - 145 mmol/L 134(L) 137 143  Potassium 3.5 - 5.1 mmol/L 4.9 3.8 3.8  Chloride 98 - 111 mmol/L 100 108 -  CO2 22 - 32 mmol/L 28 23 -  Calcium 8.9 - 10.3 mg/dL 7.8(L) 6.8(L) -  Total Protein 6.5 - 8.1 g/dL - - -  Total Bilirubin 0.3 - 1.2 mg/dL - - -  Alkaline Phos 38 - 126 U/L - - -  AST 15 - 41 U/L - - -  ALT 0 - 44 U/L - - -    CXR: -  _______________________________________________________________  Assessment and Plan: POD 2 s/p AVR, root enlargment, MAZE, atriclip  Neuro: pain control CV: on coumadin.  Will remove wires.  D/c dobutamine.   Pulm: pulm hygiene.  Will keep tubes for now Renal: creat up.  Holding diuresis GI: on diet Heme: stable ID: [email or web address] Endo: SSI Dispo: continue ICU care   3.29 10/30/2021 7:29 AM

## 2021-10-30 NOTE — Progress Notes (Signed)
NAME:  Ola Leeder, MRN:  [id number], DOB:  10-Oct-1966, LOS: 2 ADMISSION DATE:  10/28/2021, CONSULTATION DATE:  11/29 REFERRING MD:  Kipp Brood, CHIEF COMPLAINT:  post-SAVR   History of Present Illness:  Mr. Theel is a 55 y/o gentleman with a history of bicuspid aortic valve, moderate AS, Afib who was hospitalized in Sept 2022 for atrial fibrillation and was loaded on Tikosyn after unsuccessful cardioversion attempts. Due to QTc prolongation, Tikosyn was stopped. He continued to have difficult to control tachycardia and was referred for AVR, MAZE and LAA clipping.  LHC demonstrated nonobstructive CAD. Echo with LVEF 45-50%, mildly dilated atria bilaterally. His last dose of Eliquis was on 10/22/21.  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 He underwent bioprosthetic AVR, MAZE, LAAC on 11/29.   He is POD 2 and remains in the [facility] ICU.  Pertinent  Medical History  Aortic stenosis Atrial fibrillation COPD, former tobacco abuse Nonobstructive CAD HFrEF, moderate PH Chronic HCV, Cirrhosis DM2 Obesity OSA  Significant Hospital Events: Including procedures, antibiotic start and stop dates in addition to other pertinent events   11/29 Bioprosthetic AVR, MAZE, LA appendage clipping. Remained intubated overnight due to anxiety. Off vent protocol. 11/30 Extubated 12/1 Off pressors, up to chair, off dobutamine  Interim History / Subjective:  Extubated 11/30 to bipap>[place]> 3LNC  Of pressors, off dobutamine  Subjective: Endorses post surgical pain. 7/10 which is "tolerable", denies SOB. Endorses some nausea when eating.   Objective   Blood pressure 120/66, pulse 73, temperature 98.2 F (36.8 C), temperature source Oral, resp. rate 13, height 5\' 5"  (1.651 m), weight 106.9 kg, SpO2 96 %. CVP:  [17 mmHg-30 mmHg] 30 mmHg      Intake/Output Summary (Last 24 hours) at 10/30/2021 0921 Last data filed at 10/30/2021 0800 Gross per 24 hour  Intake 1351.67 ml  Output 1125 ml   Net 226.67 ml   Filed Weights   10/28/21 0608 10/29/21 0339 10/30/21 0500  Weight: 101.6 kg 109.9 kg 106.9 kg    Examination: General: In chair NAD, appears comfortable, forehead with perspiration HEENT: MM pink/moist, anicteric, atraumatic Neuro: RASS 0, PERRL 2mm, GCS 15T CV: S1S2, Afib, no m/r/g appreciated PULM: air movement present in all lobes, trachea midline, chest expansion symmetric, CT to -20 with no AL, sanguinous fluid GI: Rounded, bsx4 active, non-tender   Extremities: cool/dry, no pretibial edema, capillary refill less than 3 seconds  Skin:  Post surgical sited c/d/I, no rashes or lesions noted  WBC 16.0 HGB 11.2 PLT 90 MG 2.8 NA 134 Creat 1.71 Bun 29  TMAX 98.2 +314 admission 280 out chest tube  Resolved Hospital Problem list     Assessment & Plan:   Moderate AS with bicuspid aortic valve, s/p bioprosthetic AVR Chronic Afib, s/p MAZE and LAAC Chronic HFrEF Afib stable, rate controlled, off pressors. Off dobutamine. Amio at 30mg /hr.  -Continue post op care per protocol. Wires out 12/1. Chest tubes remain. OOB to chair. -EP consulted. Continue amio per EP, transitioning to PO amio -On comudin. Per pharmacy -Continue holding entresto -Continue pain control with hydromorphone and tramadol due to hx of cirrhosis. -Bowel regimen -Metoprolol 12.5 PO, BID -Post op abx per protocol -ASA81 daily  Acute respiratory failure post-operatively COPD OSA on CPAP On 3LNC on exam. Extubated 12/1 -Pulmonary toilet with IS and mobilization -Plan to resume anoro today -Continue PRN BD -CPAP QHS+PRN while asleep and with naps  AKI Creat 1.17>1.71 -Holding on diuresis -Hydration per diet -Ensure  renal perfusion. Goal MAP 65 or greater. -Avoid neprotoxic drugs as possible. -Strict I&O's -Follow up AM creatinine  DM, A1c 7.2 -SSI per protocol  Chronic anxiety Was on versed gtt overnight.  -PRN xanax 0.5mg  PO BID  Acute blood loss  anemia Thrombocytopenia HGB 11.9>12.3>11.2, PLT 90, secondary to cardiac surgery.  -Transfuse PRBC if HBG less than 7 -Obtain AM CBC to trend H&H -Monitor for signs of bleeding  Best Practice (right click and "Reselect all SmartList Selections" daily)   Diet/type: Regular consistency (see orders) DVT prophylaxis: Coumadin GI prophylaxis: PPI Lines: Central line and Arterial Line, removal per protocol Foley:  Yes, removal per protocol Code Status:  full code Last date of multidisciplinary goals of care discussion [ per primary ]   Critical care time: N/A     Gershon Mussel., MSN, APRN, AGACNP-BC [facility]  10/30/2021 , 9:21 AM  Please see [email or web address] for pager details  If no response, please call [phone] After hours, please call Elink at [phone]

## 2021-10-31 ENCOUNTER — Other Ambulatory Visit: Payer: Self-pay | Admitting: Physician Assistant

## 2021-10-31 ENCOUNTER — Inpatient Hospital Stay (HOSPITAL_COMMUNITY): Payer: 59

## 2021-10-31 ENCOUNTER — Encounter (HOSPITAL_COMMUNITY): Payer: Self-pay | Admitting: Thoracic Surgery (Cardiothoracic Vascular Surgery)

## 2021-10-31 DIAGNOSIS — Z952 Presence of prosthetic heart valve: Secondary | ICD-10-CM

## 2021-10-31 DIAGNOSIS — I350 Nonrheumatic aortic (valve) stenosis: Secondary | ICD-10-CM

## 2021-10-31 DIAGNOSIS — Z09 Encounter for follow-up examination after completed treatment for conditions other than malignant neoplasm: Secondary | ICD-10-CM

## 2021-10-31 DIAGNOSIS — I4819 Other persistent atrial fibrillation: Secondary | ICD-10-CM | POA: Diagnosis not present

## 2021-10-31 LAB — GLUCOSE, CAPILLARY
Glucose-Capillary: 100 mg/dL — ABNORMAL HIGH (ref 70–99)
Glucose-Capillary: 115 mg/dL — ABNORMAL HIGH (ref 70–99)
Glucose-Capillary: 120 mg/dL — ABNORMAL HIGH (ref 70–99)
Glucose-Capillary: 134 mg/dL — ABNORMAL HIGH (ref 70–99)
Glucose-Capillary: 147 mg/dL — ABNORMAL HIGH (ref 70–99)
Glucose-Capillary: 90 mg/dL (ref 70–99)

## 2021-10-31 LAB — BASIC METABOLIC PANEL
Anion gap: 8 (ref 5–15)
Anion gap: 8 (ref 5–15)
BUN: 51 mg/dL — ABNORMAL HIGH (ref 6–20)
BUN: 60 mg/dL — ABNORMAL HIGH (ref 6–20)
CO2: 27 mmol/L (ref 22–32)
CO2: 27 mmol/L (ref 22–32)
Calcium: 8 mg/dL — ABNORMAL LOW (ref 8.9–10.3)
Calcium: 8.3 mg/dL — ABNORMAL LOW (ref 8.9–10.3)
Chloride: 99 mmol/L (ref 98–111)
Chloride: 99 mmol/L (ref 98–111)
Creatinine, Ser: 2.89 mg/dL — ABNORMAL HIGH (ref 0.61–1.24)
Creatinine, Ser: 2.57 mg/dL — ABNORMAL HIGH (ref 0.61–1.24)
GFR, Estimated: 25 mL/min — ABNORMAL LOW (ref >60)
GFR, Estimated: 28 mL/min — ABNORMAL LOW (ref >60)
Glucose, Bld: 169 mg/dL — ABNORMAL HIGH (ref 70–99)
Glucose, Bld: 144 mg/dL — ABNORMAL HIGH (ref 70–99)
Potassium: 5.1 mmol/L (ref 3.5–5.1)
Potassium: 4.6 mmol/L (ref 3.5–5.1)
Sodium: 134 mmol/L — ABNORMAL LOW (ref 135–145)
Sodium: 134 mmol/L — ABNORMAL LOW (ref 135–145)

## 2021-10-31 LAB — CBC
HCT: 33.4 % — ABNORMAL LOW (ref 39.0–52.0)
Hemoglobin: 10.5 g/dL — ABNORMAL LOW (ref 13.0–17.0)
MCH: 29 pg (ref 26.0–34.0)
MCHC: 31.4 g/dL (ref 30.0–36.0)
MCV: 92.3 fL (ref 80.0–100.0)
Platelets: 107 10*3/uL — ABNORMAL LOW (ref 150–400)
RBC: 3.62 MIL/uL — ABNORMAL LOW (ref 4.22–5.81)
RDW: 13.1 % (ref 11.5–15.5)
WBC: 16.3 10*3/uL — ABNORMAL HIGH (ref 4.0–10.5)
nRBC: 0 % (ref 0.0–0.2)

## 2021-10-31 LAB — PROTIME-INR
INR: 1.7 — ABNORMAL HIGH (ref 0.8–1.2)
Prothrombin Time: 20.3 seconds — ABNORMAL HIGH (ref 11.4–15.2)

## 2021-10-31 LAB — LACTIC ACID, PLASMA: Lactic Acid, Venous: 1.1 mmol/L (ref 0.5–1.9)

## 2021-10-31 MED ORDER — FUROSEMIDE 10 MG/ML IJ SOLN
60.0000 mg | Freq: Once | INTRAMUSCULAR | Status: AC
  Administered 2021-10-31: 60 mg via INTRAVENOUS
  Filled 2021-10-31: qty 6

## 2021-10-31 MED ORDER — ENOXAPARIN SODIUM 30 MG/0.3ML IJ SOSY
30.0000 mg | PREFILLED_SYRINGE | Freq: Every day | INTRAMUSCULAR | Status: DC
  Administered 2021-10-31: 30 mg via SUBCUTANEOUS
  Filled 2021-10-31: qty 0.3

## 2021-10-31 MED FILL — Heparin Sodium (Porcine) Inj 1000 Unit/ML: INTRAMUSCULAR | Qty: 10 | Status: AC

## 2021-10-31 MED FILL — Sodium Bicarbonate IV Soln 8.4%: INTRAVENOUS | Qty: 50 | Status: AC

## 2021-10-31 MED FILL — Electrolyte-R (PH 7.4) Solution: INTRAVENOUS | Qty: 7000 | Status: AC

## 2021-10-31 MED FILL — Mannitol IV Soln 20%: INTRAVENOUS | Qty: 500 | Status: AC

## 2021-10-31 MED FILL — Albumin, Human Inj 5%: INTRAVENOUS | Qty: 250 | Status: AC

## 2021-10-31 MED FILL — Calcium Chloride Inj 10%: INTRAVENOUS | Qty: 10 | Status: AC

## 2021-10-31 MED FILL — Sodium Chloride IV Soln 0.9%: INTRAVENOUS | Qty: 3000 | Status: AC

## 2021-10-31 NOTE — Progress Notes (Signed)
Per [facility] request Post-op echo AS, AVR Primary cardiologist Dr. Dulce Sellar Surgeon Dr. Cliffton Asters

## 2021-10-31 NOTE — Progress Notes (Signed)
CPAP on hold again tonight due to nausea. CPAP is at bedside if pt wants to try later.

## 2021-10-31 NOTE — Progress Notes (Signed)
      [address]       [place] [address]             [phone]                 3 Days Post-Op Procedure(s) (LRB): AORTIC VALVE REPLACEMENT (AVR) WITH INSPIRIS RESILIA AORTIC VALVE SIZE (N/A) MAZE (N/A) CLIPPING OF ATRIAL APPENDAGE WITH ATRICLIP ACHV45 (N/A) TRANSESOPHAGEAL ECHOCARDIOGRAM (TEE) (N/A) APPLICATION OF CELL SAVER AORTIC ROOT ENLARGEMENT WITH HEMASHIELD PLATINUM X 30CM   Events: Afib, but rate controlled Dopamine started _______________________________________________________________ Vitals: BP 126/68   Pulse 66   Temp 97.9 F (36.6 C) (Axillary)   Resp 11   Ht 5\' 5"  (1.651 m)   Wt 107.2 kg   SpO2 100%   BMI 39.33 kg/m  Filed Weights   10/29/21 0339 10/30/21 0500 10/31/21 0500  Weight: 109.9 kg 106.9 kg 107.2 kg     - Neuro: alert NAD  - Cardiovascular: afib   Drips: dop @ 2.5    - Pulm:  EWOB  ABG    Component Value Date/Time   PHART 7.373 10/29/2021 0755   PCO2ART 41.6 10/29/2021 0755   PO2ART 220 (H) 10/29/2021 0755   HCO3 24.0 10/29/2021 0755   TCO2 25 10/29/2021 0755   ACIDBASEDEF 1.0 10/29/2021 0755   O2SAT 100.0 10/29/2021 0755    - Abd: ND - Extremity: warm  .Intake/Output      12/01 0701 12/02 0700 12/02 0701 12/03 0700   I.V. (mL/kg) 80.4 (0.7)    IV Piggyback     Total Intake(mL/kg) 80.4 (0.7)    Urine (mL/kg/hr) 630 (0.2)    Emesis/NG output 0    Chest Tube 80    Total Output 710    Net -629.7         Emesis Occurrence 1 x       _______________________________________________________________ Labs: CBC Latest Ref Rng & Units 10/31/2021 10/30/2021 10/29/2021  WBC 4.0 - 10.5 K/uL 16.3(H) 16.0(H) 15.6(H)  Hemoglobin 13.0 - 17.0 g/dL 10.5(L) 11.2(L) 11.4(L)  Hematocrit 39.0 - 52.0 % 33.4(L) 34.9(L) 35.1(L)  Platelets 150 - 400 K/uL 107(L) 90(L) 91(L)   CMP Latest Ref Rng & Units 10/31/2021 10/30/2021 10/30/2021  Glucose 70 - 99 mg/dL 14/11/2020) - 195(K)  BUN 6 - 20 mg/dL 932(I) - 71(I)   Creatinine 0.61 - 1.24 mg/dL 45(Y) 0.99(I) 3.38(S)  Sodium 135 - 145 mmol/L 134(L) 136 134(L)  Potassium 3.5 - 5.1 mmol/L 5.1 - 4.9  Chloride 98 - 111 mmol/L 99 - 100  CO2 22 - 32 mmol/L 27 - 28  Calcium 8.9 - 10.3 mg/dL 8.0(L) - 7.8(L)  Total Protein 6.5 - 8.1 g/dL - - -  Total Bilirubin 0.3 - 1.2 mg/dL - - -  Alkaline Phos 38 - 126 U/L - - -  AST 15 - 41 U/L - - -  ALT 0 - 44 U/L - - -    CXR: -  _______________________________________________________________  Assessment and Plan: POD 3 s/p AVR, root enlargment, MAZE, atriclip  Neuro: pain control CV: on coumadin.  Continue dop.   Pulm: pulm hygiene.  Will remove CTs Renal: creat up.  Holding diuresis GI: on diet Heme: stable ID: afebrile Endo: SSI Dispo: continue ICU care   5.05(L 10/31/2021 7:35 AM

## 2021-10-31 NOTE — Progress Notes (Signed)
Electrophysiology Rounding Note  Patient Name: Marcus Hicks Date of Encounter: 10/31/2021  Primary Cardiologist: None Electrophysiologist: Lanier Prude, MD   Subjective   Complaining of nausea and incisional pain. Denies flatus  Inpatient Medications    Scheduled Meds:  acetaminophen  1,000 mg Oral Q6H   Or   acetaminophen (TYLENOL) oral liquid 160 mg/5 mL  1,000 mg Per Tube Q6H   amiodarone  400 mg Oral BID   aspirin EC  81 mg Oral Daily   bisacodyl  10 mg Oral Daily   Or   bisacodyl  10 mg Rectal Daily   Chlorhexidine Gluconate Cloth  6 each Topical Daily   docusate sodium  200 mg Oral Daily   enoxaparin (LOVENOX) injection  40 mg Subcutaneous QHS   insulin aspart  0-24 Units Subcutaneous Q4H   insulin detemir  15 Units Subcutaneous Q12H   mouth rinse  15 mL Mouth Rinse BID   metoprolol tartrate  12.5 mg Oral BID   pantoprazole  40 mg Oral Daily   polyethylene glycol  17 g Oral Daily   sodium chloride flush  10-40 mL Intracatheter Q12H   sodium chloride flush  3 mL Intravenous Q12H   umeclidinium-vilanterol  1 puff Inhalation Daily   warfarin  5 mg Oral q1600   Warfarin - Physician Dosing Inpatient   Does not apply q1600   Continuous Infusions:  sodium chloride     sodium chloride     sodium chloride     DOPamine 2.5 mcg/kg/min (10/30/21 1842)   lactated ringers     lactated ringers     lactated ringers 20 mL/hr at 10/30/21 0800   promethazine (PHENERGAN) injection (IM or IVPB) 12.5 mg (10/31/21 0319)   PRN Meds: sodium chloride, ALPRAZolam, dextrose, HYDROmorphone (DILAUDID) injection, HYDROmorphone, levalbuterol **AND** ipratropium, lactated ringers, metoprolol tartrate, ondansetron (ZOFRAN) IV, promethazine (PHENERGAN) injection (IM or IVPB), sodium chloride flush, sodium chloride flush, traMADol   Vital Signs    Vitals:   10/31/21 0645 10/31/21 0700 10/31/21 0756 10/31/21 0818  BP: 126/68   133/68  Pulse:    72  Resp: 10 11  14   Temp:    98 F (36.7 C)   TempSrc:   Oral   SpO2:    98%  Weight:      Height:        Intake/Output Summary (Last 24 hours) at 10/31/2021 0833 Last data filed at 10/31/2021 0400 Gross per 24 hour  Intake --  Output 650 ml  Net -650 ml   Filed Weights   10/29/21 0339 10/30/21 0500 10/31/21 0500  Weight: 109.9 kg 106.9 kg 107.2 kg    Physical Exam    GEN- The patient is fatigued and uncomfortable appearing, alert and oriented x 3 today.   Head- normocephalic, atraumatic Eyes-  Sclera clear, conjunctiva pink Ears- hearing intact Oropharynx- clear Neck- supple Lungs- Clear to ausculation bilaterally, normal work of breathing Heart- Irregularly irregular rate and rhythm, no murmurs, rubs or gallops GI- soft, NT, ND Extremities- no clubbing or cyanosis. No edema Skin- no rash or lesion Psych- euthymic mood, full affect Neuro- strength and sensation are intact  Labs    CBC Recent Labs    10/30/21 0440 10/31/21 0320  WBC 16.0* 16.3*  HGB 11.2* 10.5*  HCT 34.9* 33.4*  MCV 91.4 92.3  PLT 90* 107*   Basic Metabolic Panel Recent Labs    14/02/22 1703 10/30/21 0440 10/30/21 1545 10/31/21 0320  NA 137 134* 136  134*  K 3.8 4.9  --  5.1  CL 108 100  --  99  CO2 23 28  --  27  GLUCOSE 129* 154*  --  169*  BUN 19 29*  --  51*  CREATININE 1.17 1.71* 2.96* 2.89*  CALCIUM 6.8* 7.8*  --  8.0*  MG 2.4 2.8*  --   --    Liver Function Tests No results for input(s): AST, ALT, ALKPHOS, BILITOT, PROT, ALBUMIN in the last 72 hours. No results for input(s): LIPASE, AMYLASE in the last 72 hours. Cardiac Enzymes No results for input(s): CKTOTAL, CKMB, CKMBINDEX, TROPONINI in the last 72 hours.   Telemetry    Appears to be rate controlled AF 60-70s, EKG to clarify (personally reviewed)  Radiology    No results found.  Patient Profile     Marcus Hicks is a 55 y.o. male with a history of persistent atrial fibrillation, bicuspid AV, Chronic Hep C, DM2, COPD, and OSA  who is  being seen today for the evaluation of atrial fibrillation at the request of Dr. Cliffton Asters.  Assessment & Plan    Persistent atrial fibrillation S/p MAZE and LAA clipping Remains in rate controlled AF by EKG this am. Continue po amiodarone 400 mg BID for now Back on coumadin starting 11/30 per TCTS.    2. Bicuspid aortic valve and severe aortic stenosis POD#3 AVR, root enlargement, MAZE, and LAA clip Post op course going well so far. Extubated, weaning pressors as tolerated.  Dopamine started last night Denies flatus or BM   He remains in rate controlled AF. Continue amiodarone.  EP to see as needed over weekend. Please call with questions.   For questions or updates, please contact [facility] Please consult [email or web address] for contact info under Cardiology/STEMI.  Signed, Graciella Freer, PA-C  10/31/2021, 8:33 AM

## 2021-10-31 NOTE — Progress Notes (Signed)
NAME:  Marcus Hicks, MRN:  [id number], DOB:  02-16-1966, LOS: 3 ADMISSION DATE:  10/28/2021, CONSULTATION DATE:  11/29 REFERRING MD:  Marcus Hicks, CHIEF COMPLAINT:  post-SAVR   History of Present Illness:  Marcus Hicks is a 55 y/o gentleman with a history of bicuspid aortic valve, moderate AS, Afib who was hospitalized in Sept 2022 for atrial fibrillation and was loaded on Tikosyn after unsuccessful cardioversion attempts. Due to QTc prolongation, Tikosyn was stopped. He continued to have difficult to control tachycardia and was referred for AVR, MAZE and LAA clipping.  LHC demonstrated nonobstructive CAD. Echo with LVEF 45-50%, mildly dilated atria bilaterally. His last dose of Eliquis was on 10/22/21.  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 He underwent bioprosthetic AVR, MAZE, LAAC on 11/29.   He is POD 3 and remains in the [facility] ICU.  Pertinent  Medical History  Aortic stenosis Atrial fibrillation COPD, former tobacco abuse Nonobstructive CAD HFrEF, moderate PH Chronic HCV, Cirrhosis DM2 Obesity OSA  Significant Hospital Events: Including procedures, antibiotic start and stop dates in addition to other pertinent events   11/29 Bioprosthetic AVR, MAZE, LA appendage clipping. Remained intubated overnight due to anxiety. Off vent protocol. 11/30 Extubated 12/1 Off pressors, up to chair, off dobutamine  Interim History / Subjective:  Overnight creatinine rise. Started on dopamine. Creatine downtrending.   Dopamine 2.5/mg/kg  Subjective: "I don't feel good, some complaints of nausea, denies chest pain and SOB.  Objective   Blood pressure 133/68, pulse 72, temperature 98.2 F (36.8 C), temperature source Oral, resp. rate 14, height 5\' 5"  (1.651 m), weight 107.2 kg, SpO2 98 %.        Intake/Output Summary (Last 24 hours) at 10/31/2021 1244 Last data filed at 10/31/2021 0400 Gross per 24 hour  Intake --  Output 650 ml  Net -650 ml   Filed Weights    10/29/21 0339 10/30/21 0500 10/31/21 0500  Weight: 109.9 kg 106.9 kg 107.2 kg    Examination: General: Sitting in chair, NAD, appears comfortable,  HEENT: MM pink/moist, anicteric, atraumatic Neuro: RASS 0, PERRL 40mm, GCS 15t CV: S1S2, Afib, no m/r/g appreciated PULM:  clear in the upper lobes, clear in the lower lobes, trachea midline, chest expansion symmetric, 172ml on IS, ct in place to -20. GI: Rounded, bsx4 hypoactive, non-tender   Extremities: cool/dry, no pretibial edema, capillary refill less than 3 seconds  Skin: Cardiac surgery sites c/d/I, no rashes or lesions noted  Tmax 98.2 630 UOP past 24 hours, -315Ml K 5.1 NA 134 Creat 2.89 BUN 29>51 EKG: Afib, RBB CXR: L atelectasis, small effusion Resolved Hospital Problem list     Assessment & Plan:   Moderate AS with bicuspid aortic valve, s/p bioprosthetic AVR Chronic Afib, s/p MAZE and LAAC Chronic HFrEF Afib stable, rate controlled, off pressors. -Continue post op protocol -Check CVP -EP following. On PO amio and metoprolol -On comudin. Per pharmacy. AC plan per TCTS.  Starting bridge to lovenox. Beginning bridge to DOAC -Continue holding entresto -Continue pain control with hydromorphone and tramadol due to hx of cirrhosis. -Continue bowel regimen. Suppository given in AM -Continue Metoprolol 12.5 PO, BID -Continue ASA81 daily  Acute respiratory failure post-operatively COPD OSA on CPAP On 3LNC>4LNC on exam. 1000 IS -Anoro started -Continue PRN BD -CPAP QHS and PRN while asleep and with naps.  AKI Creat 1.17>1.71>2.96>2.89. Expected considering surgery. Almost net even on documentation. ? Volume status with insensible losses. -Check CVP -Place foley -Check renal US -  Ensure renal perfusion. Goal MAP 65 or greater. -Avoid neprotoxic drugs as possible. -Strict I&O's -Follow up AM creatinine -Consider AM Neph consult if no improvement -Holding on diuresis  DM, A1c 7.2 -SSI per protocol -[place] 15  U  Chronic anxiety -Continue PRN xanax 0.5mg  PO BID  Acute blood loss anemia Thrombocytopenia PLT 90>107 HGB 11.2>33.4, expected post surgery -AM CBC -Monitor  -Transfuse PRBC if HBG less than 7  Nausea -PRN zofran  Best Practice (right click and "Reselect all SmartList Selections" daily)   Diet/type: Regular consistency (see orders) DVT prophylaxis: Coumadin GI prophylaxis: PPI Lines: Central line, removal per protocol Foley:  Replace Code Status:  full code Last date of multidisciplinary goals of care discussion [ per primary ]   Critical care time: N/A    Marcus Hicks., MSN, APRN, AGACNP-BC [facility]  10/31/2021 , 12:44 PM  Please see [email or web address] for pager details  If no response, please call [phone] After hours, please call Elink at [phone]

## 2021-10-31 NOTE — Plan of Care (Signed)

## 2021-11-01 ENCOUNTER — Inpatient Hospital Stay (HOSPITAL_COMMUNITY): Payer: 59

## 2021-11-01 DIAGNOSIS — Z952 Presence of prosthetic heart valve: Secondary | ICD-10-CM | POA: Diagnosis not present

## 2021-11-01 LAB — CBC
HCT: 33.7 % — ABNORMAL LOW (ref 39.0–52.0)
Hemoglobin: 10.9 g/dL — ABNORMAL LOW (ref 13.0–17.0)
MCH: 29.6 pg (ref 26.0–34.0)
MCHC: 32.3 g/dL (ref 30.0–36.0)
MCV: 91.6 fL (ref 80.0–100.0)
Platelets: 140 10*3/uL — ABNORMAL LOW (ref 150–400)
RBC: 3.68 MIL/uL — ABNORMAL LOW (ref 4.22–5.81)
RDW: 13.1 % (ref 11.5–15.5)
WBC: 11.9 10*3/uL — ABNORMAL HIGH (ref 4.0–10.5)
nRBC: 0 % (ref 0.0–0.2)

## 2021-11-01 LAB — BASIC METABOLIC PANEL
Anion gap: 5 (ref 5–15)
BUN: 50 mg/dL — ABNORMAL HIGH (ref 6–20)
CO2: 30 mmol/L (ref 22–32)
Calcium: 8.4 mg/dL — ABNORMAL LOW (ref 8.9–10.3)
Chloride: 102 mmol/L (ref 98–111)
Creatinine, Ser: 1.55 mg/dL — ABNORMAL HIGH (ref 0.61–1.24)
GFR, Estimated: 52 mL/min — ABNORMAL LOW (ref >60)
Glucose, Bld: 105 mg/dL — ABNORMAL HIGH (ref 70–99)
Potassium: 4.5 mmol/L (ref 3.5–5.1)
Sodium: 137 mmol/L (ref 135–145)

## 2021-11-01 LAB — PROTIME-INR
INR: 2.5 — ABNORMAL HIGH (ref 0.8–1.2)
Prothrombin Time: 26.9 seconds — ABNORMAL HIGH (ref 11.4–15.2)

## 2021-11-01 LAB — GLUCOSE, CAPILLARY
Glucose-Capillary: 111 mg/dL — ABNORMAL HIGH (ref 70–99)
Glucose-Capillary: 125 mg/dL — ABNORMAL HIGH (ref 70–99)
Glucose-Capillary: 155 mg/dL — ABNORMAL HIGH (ref 70–99)
Glucose-Capillary: 81 mg/dL (ref 70–99)
Glucose-Capillary: 90 mg/dL (ref 70–99)
Glucose-Capillary: 97 mg/dL (ref 70–99)

## 2021-11-01 MED ORDER — FUROSEMIDE 10 MG/ML IJ SOLN
40.0000 mg | Freq: Two times a day (BID) | INTRAMUSCULAR | Status: AC
  Administered 2021-11-01 – 2021-11-02 (×3): 40 mg via INTRAVENOUS
  Filled 2021-11-01 (×3): qty 4

## 2021-11-01 MED ORDER — METOCLOPRAMIDE HCL 5 MG/ML IJ SOLN
10.0000 mg | Freq: Four times a day (QID) | INTRAMUSCULAR | Status: AC
  Administered 2021-11-01 – 2021-11-02 (×4): 10 mg via INTRAVENOUS
  Filled 2021-11-01 (×4): qty 2

## 2021-11-01 NOTE — Anesthesia Postprocedure Evaluation (Signed)
Anesthesia Post Note  Patient: Marcus Hicks  Procedure(s) Performed: AORTIC VALVE REPLACEMENT (AVR) WITH INSPIRIS RESILIA AORTIC VALVE SIZE (Chest) MAZE CLIPPING OF ATRIAL APPENDAGE WITH ATRICLIP ACHV45 TRANSESOPHAGEAL ECHOCARDIOGRAM (TEE) APPLICATION OF CELL SAVER AORTIC ROOT ENLARGEMENT WITH HEMASHIELD PLATINUM X 30CM (Chest)     Patient location during evaluation: ICU Anesthesia Type: General Level of consciousness: sedated Pain management: pain level controlled Vital Signs Assessment: post-procedure vital signs reviewed and stable Respiratory status: respiratory function stable and patient remains intubated per anesthesia plan Cardiovascular status: stable Anesthetic complications: no   No notable events documented.  Last Vitals:  Vitals:   11/01/21 1206 11/01/21 1300  BP:  116/61  Pulse: 68 69  Resp: 15 14  Temp: 36.7 C   SpO2: (!) 87% 97%    Last Pain:  Vitals:   11/01/21 1206  TempSrc: Oral  PainSc:                  Kaydn Kumpf

## 2021-11-01 NOTE — Progress Notes (Signed)
      [address]       [place] [address]             [phone]                 4 Days Post-Op Procedure(s) (LRB): AORTIC VALVE REPLACEMENT (AVR) WITH INSPIRIS RESILIA AORTIC VALVE SIZE (N/A) MAZE (N/A) CLIPPING OF ATRIAL APPENDAGE WITH ATRICLIP ACHV45 (N/A) TRANSESOPHAGEAL ECHOCARDIOGRAM (TEE) (N/A) APPLICATION OF CELL SAVER AORTIC ROOT ENLARGEMENT WITH HEMASHIELD PLATINUM X 30CM   Events: No events _______________________________________________________________ Vitals: BP 118/70   Pulse 67   Temp 98.2 F (36.8 C) (Oral)   Resp (!) 9   Ht 5\' 5"  (1.651 m)   Wt 108.1 kg   SpO2 98%   BMI 39.66 kg/m  Filed Weights   10/30/21 0500 10/31/21 0500 11/01/21 0500  Weight: 106.9 kg 107.2 kg 108.1 kg     - Neuro: alert NAD  - Cardiovascular: afib   Drips: none CVP:  [10 mmHg-23 mmHg] 15 mmHg  - Pulm:  EWOB  ABG    Component Value Date/Time   PHART 7.373 10/29/2021 0755   PCO2ART 41.6 10/29/2021 0755   PO2ART 220 (H) 10/29/2021 0755   HCO3 24.0 10/29/2021 0755   TCO2 25 10/29/2021 0755   ACIDBASEDEF 1.0 10/29/2021 0755   O2SAT 100.0 10/29/2021 0755    - Abd: ND - Extremity: edematous  .Intake/Output      12/02 0701 12/03 0700 12/03 0701 12/04 0700   I.V. (mL/kg) 341.1 (3.2)    IV Piggyback 52.1    Total Intake(mL/kg) 393.2 (3.6)    Urine (mL/kg/hr) 3285 (1.3)    Emesis/NG output     Chest Tube 10    Total Output 3295    Net -2901.8            _______________________________________________________________ Labs: CBC Latest Ref Rng & Units 11/01/2021 10/31/2021 10/30/2021  WBC 4.0 - 10.5 K/uL 11.9(H) 16.3(H) 16.0(H)  Hemoglobin 13.0 - 17.0 g/dL 10.9(L) 10.5(L) 11.2(L)  Hematocrit 39.0 - 52.0 % 33.7(L) 33.4(L) 34.9(L)  Platelets 150 - 400 K/uL 140(L) 107(L) 90(L)   CMP Latest Ref Rng & Units 11/01/2021 10/31/2021 10/31/2021  Glucose 70 - 99 mg/dL 14/12/2020) 262(M) 355(H)  BUN 6 - 20 mg/dL 741(U) 38(G) 53(M)  Creatinine 0.61  - 1.24 mg/dL 46(O) 0.32(Z) 2.24(M)  Sodium 135 - 145 mmol/L 137 134(L) 134(L)  Potassium 3.5 - 5.1 mmol/L 4.5 4.6 5.1  Chloride 98 - 111 mmol/L 102 99 99  CO2 22 - 32 mmol/L 30 27 27   Calcium 8.9 - 10.3 mg/dL 2.50(I) 8.3(L) 8.0(L)  Total Protein 6.5 - 8.1 g/dL - - -  Total Bilirubin 0.3 - 1.2 mg/dL - - -  Alkaline Phos 38 - 126 U/L - - -  AST 15 - 41 U/L - - -  ALT 0 - 44 U/L - - -    CXR: -  _______________________________________________________________  Assessment and Plan: POD 4 s/p AVR, root enlargment, MAZE, atriclip  Neuro: pain control CV: on coumadin.  INR 2.5 Pulm: pulm hygiene.   Renal: creat down, great uop.  Continue diuresis GI: on diet Heme: stable ID: [email or web address] Endo: SSI Dispo: continue ICU care   11/01/2021 8:34 AM

## 2021-11-01 NOTE — Progress Notes (Signed)
Pt refusing CPAP for tonight 

## 2021-11-01 NOTE — Progress Notes (Signed)
NAME:  Marcus Hicks, MRN:  [id number], DOB:  November 20, 1966, LOS: 4 ADMISSION DATE:  10/28/2021, CONSULTATION DATE:  11/29 REFERRING MD:  Cliffton Asters, CHIEF COMPLAINT:  post-SAVR   History of Present Illness:  Mr. Marcus Hicks is a 55 y/o gentleman with a history of bicuspid aortic valve, moderate AS, Afib who was hospitalized in Sept 2022 for atrial fibrillation and was loaded on Tikosyn after unsuccessful cardioversion attempts. Due to QTc prolongation, Tikosyn was stopped. He continued to have difficult to control tachycardia and was referred for AVR, MAZE and LAA clipping.  LHC demonstrated nonobstructive CAD. Echo with LVEF 45-50%, mildly dilated atria bilaterally. His last dose of Eliquis was on 10/22/21.  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 He underwent bioprosthetic AVR, MAZE, LAAC on 11/29.   He is POD 3 and remains in the [facility] ICU.  Pertinent  Medical History  Aortic stenosis Atrial fibrillation COPD, former tobacco abuse Nonobstructive CAD HFrEF, moderate PH Chronic HCV, Cirrhosis DM2 Obesity OSA  Significant Hospital Events: Including procedures, antibiotic start and stop dates in addition to other pertinent events   11/29 Bioprosthetic AVR, MAZE, LA appendage clipping. Remained intubated overnight due to anxiety. Off vent protocol. 11/30 Extubated 12/1 Off pressors, up to chair, off dobutamine 12/2 rising creatinine. - Foley placed with increased U/O, POC echo shows no tamponade. RV/LV normal. CVP 18 so given Lasix  Interim History / Subjective:  Given furosemide for high CVP and decreasing saturations.  Still feels generally unwell, edema and nausea seem to be major concerns.  Chest pain reasonably well controlled.   Objective   Blood pressure 118/70, pulse 67, temperature 97.8 F (36.6 C), temperature source Oral, resp. rate (!) 9, height 5\' 5"  (1.651 m), weight 108.1 kg, SpO2 97 %. CVP:  [10 mmHg-23 mmHg] 15 mmHg      Intake/Output Summary  (Last 24 hours) at 11/01/2021 0759 Last data filed at 11/01/2021 0600 Gross per 24 hour  Intake 393.19 ml  Output 3295 ml  Net -2901.81 ml    Filed Weights   10/30/21 0500 10/31/21 0500 11/01/21 0500  Weight: 106.9 kg 107.2 kg 108.1 kg    Examination: General: Sitting in chair, NAD, uncomfortable,  HEENT: MM pink/moist, anicteric, atraumatic Neuro: awake and following commands in chair.  CV: S1S2, Afib, no m/r/g appreciated PULM:  chest tubes are out. Good chest excursion with only a few crackles at bases.  GI: Rounded, bsx4 hypoactive, non-tender   Extremities: extremities are warmer than yesterday.  Skin: Cardiac surgery sites c/d/I, no rashes or lesions noted   Ancillary tests personally reviewed:  Creatinine improving to 1.55 INR now therapeutic at 2.5 Renal ultrasound shows normal kidneys but demonstrates cirrhosis.  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  - Continue current pain regimen  - Continue to encourage CPAP use - may be more compliant once nausea improves.  - Continue warfarin per pharmacy.  - Ambulate and continue to wean O2 - presently euglycemic  Best Practice (right click and "Reselect all SmartList Selections" daily)   Diet/type: Regular consistency (see orders) DVT prophylaxis: Coumadin GI prophylaxis: PPI Lines: Central line, removal per protocol Foley:  Replace Code Status:  full code Last date of multidisciplinary goals of care discussion [ per primary ]  Kipp Brood, MD [facility] ICU Physician [place]  Pager: [phone] Or Epic Secure Chat After hours:  [phone].  11/01/2021, 8:21 AM

## 2021-11-02 LAB — GLUCOSE, CAPILLARY
Glucose-Capillary: 103 mg/dL — ABNORMAL HIGH (ref 70–99)
Glucose-Capillary: 113 mg/dL — ABNORMAL HIGH (ref 70–99)
Glucose-Capillary: 113 mg/dL — ABNORMAL HIGH (ref 70–99)
Glucose-Capillary: 127 mg/dL — ABNORMAL HIGH (ref 70–99)
Glucose-Capillary: 138 mg/dL — ABNORMAL HIGH (ref 70–99)
Glucose-Capillary: 169 mg/dL — ABNORMAL HIGH (ref 70–99)
Glucose-Capillary: 209 mg/dL — ABNORMAL HIGH (ref 70–99)
Glucose-Capillary: 91 mg/dL (ref 70–99)

## 2021-11-02 LAB — PROTIME-INR
INR: 4 — ABNORMAL HIGH (ref 0.8–1.2)
Prothrombin Time: 39.3 seconds — ABNORMAL HIGH (ref 11.4–15.2)

## 2021-11-02 LAB — BASIC METABOLIC PANEL
Anion gap: 6 (ref 5–15)
BUN: 40 mg/dL — ABNORMAL HIGH (ref 6–20)
CO2: 33 mmol/L — ABNORMAL HIGH (ref 22–32)
Calcium: 8.4 mg/dL — ABNORMAL LOW (ref 8.9–10.3)
Chloride: 100 mmol/L (ref 98–111)
Creatinine, Ser: 1.17 mg/dL (ref 0.61–1.24)
GFR, Estimated: >60 mL/min (ref >60)
Glucose, Bld: 86 mg/dL (ref 70–99)
Potassium: 3.9 mmol/L (ref 3.5–5.1)
Sodium: 139 mmol/L (ref 135–145)

## 2021-11-02 LAB — CBC
HCT: 30.5 % — ABNORMAL LOW (ref 39.0–52.0)
Hemoglobin: 9.9 g/dL — ABNORMAL LOW (ref 13.0–17.0)
MCH: 29.7 pg (ref 26.0–34.0)
MCHC: 32.5 g/dL (ref 30.0–36.0)
MCV: 91.6 fL (ref 80.0–100.0)
Platelets: 136 10*3/uL — ABNORMAL LOW (ref 150–400)
RBC: 3.33 MIL/uL — ABNORMAL LOW (ref 4.22–5.81)
RDW: 13.2 % (ref 11.5–15.5)
WBC: 8.1 10*3/uL (ref 4.0–10.5)
nRBC: 0 % (ref 0.0–0.2)

## 2021-11-02 MED ORDER — FUROSEMIDE 40 MG PO TABS
40.0000 mg | ORAL_TABLET | Freq: Every day | ORAL | Status: DC
  Administered 2021-11-03 – 2021-11-04 (×2): 40 mg via ORAL
  Filled 2021-11-02 (×2): qty 1

## 2021-11-02 MED ORDER — POTASSIUM CHLORIDE CRYS ER 20 MEQ PO TBCR
40.0000 meq | EXTENDED_RELEASE_TABLET | Freq: Every day | ORAL | Status: DC
  Administered 2021-11-02 – 2021-11-04 (×3): 40 meq via ORAL
  Filled 2021-11-02 (×3): qty 2

## 2021-11-02 MED ORDER — ~~LOC~~ CARDIAC SURGERY, PATIENT & FAMILY EDUCATION: Freq: Once | Status: AC

## 2021-11-02 MED ORDER — WARFARIN SODIUM 2.5 MG PO TABS: 2.5000 mg | ORAL_TABLET | Freq: Every day | ORAL | Status: DC

## 2021-11-02 MED ORDER — SODIUM CHLORIDE 0.9% FLUSH: 3.0000 mL | INTRAVENOUS | Status: DC | PRN

## 2021-11-02 MED ORDER — FUROSEMIDE 10 MG/ML IJ SOLN
40.0000 mg | Freq: Once | INTRAMUSCULAR | Status: AC
  Administered 2021-11-02: 11:00:00 40 mg via INTRAVENOUS
  Filled 2021-11-02: qty 4

## 2021-11-02 MED ORDER — SODIUM CHLORIDE 0.9 % IV SOLN: 250.0000 mL | INTRAVENOUS | Status: DC | PRN

## 2021-11-02 MED ORDER — SODIUM CHLORIDE 0.9% FLUSH
3.0000 mL | Freq: Two times a day (BID) | INTRAVENOUS | Status: DC
  Administered 2021-11-02 – 2021-11-04 (×4): 3 mL via INTRAVENOUS

## 2021-11-02 NOTE — Progress Notes (Signed)
      [address]       [place] [id number]             [phone]                 5 Days Post-Op Procedure(s) (LRB): AORTIC VALVE REPLACEMENT (AVR) WITH INSPIRIS RESILIA AORTIC VALVE SIZE (N/A) MAZE (N/A) CLIPPING OF ATRIAL APPENDAGE WITH ATRICLIP ACHV45 (N/A) TRANSESOPHAGEAL ECHOCARDIOGRAM (TEE) (N/A) APPLICATION OF CELL SAVER AORTIC ROOT ENLARGEMENT WITH HEMASHIELD PLATINUM X 30CM   Events: No events Feels better _______________________________________________________________ Vitals: BP (!) 142/74   Pulse 85   Temp 97.9 F (36.6 C) (Oral)   Resp 17   Ht 5\' 5"  (1.651 m)   Wt 106.6 kg   SpO2 98%   BMI 39.11 kg/m  Filed Weights   10/31/21 0500 11/01/21 0500 11/02/21 0500  Weight: 107.2 kg 108.1 kg 106.6 kg     - Neuro: alert NAD  - Cardiovascular: afib   Drips: none CVP:  [15 mmHg-17 mmHg] 15 mmHg  - Pulm:  EWOB  ABG    Component Value Date/Time   PHART 7.373 10/29/2021 0755   PCO2ART 41.6 10/29/2021 0755   PO2ART 220 (H) 10/29/2021 0755   HCO3 24.0 10/29/2021 0755   TCO2 25 10/29/2021 0755   ACIDBASEDEF 1.0 10/29/2021 0755   O2SAT 100.0 10/29/2021 0755    - Abd: ND - Extremity: edematous  .Intake/Output      12/03 0701 12/04 0700 12/04 0701 12/05 0700   P.O. 1440    I.V. (mL/kg) 11.3 (0.1)    IV Piggyback     Total Intake(mL/kg) 1451.3 (13.6)    Urine (mL/kg/hr) 2530 (1)    Chest Tube     Total Output 2530    Net -1078.7            _______________________________________________________________ Labs: CBC Latest Ref Rng & Units 11/02/2021 11/01/2021 10/31/2021  WBC 4.0 - 10.5 K/uL 8.1 11.9(H) 16.3(H)  Hemoglobin 13.0 - 17.0 g/dL 14/12/2020) 10.9(L) 10.5(L)  Hematocrit 39.0 - 52.0 % 30.5(L) 33.7(L) 33.4(L)  Platelets 150 - 400 K/uL 136(L) 140(L) 107(L)   CMP Latest Ref Rng & Units 11/02/2021 11/01/2021 10/31/2021  Glucose 70 - 99 mg/dL 86 14/12/2020) 086(V)  BUN 6 - 20 mg/dL 784(O) 96(E) 95(M)  Creatinine 0.61 - 1.24  mg/dL 84(X 3.24) 4.01(U)  Sodium 135 - 145 mmol/L 139 137 134(L)  Potassium 3.5 - 5.1 mmol/L 3.9 4.5 4.6  Chloride 98 - 111 mmol/L 100 102 99  CO2 22 - 32 mmol/L 33(H) 30 27  Calcium 8.9 - 10.3 mg/dL 2.72(Z) 3.6(U) 8.3(L)  Total Protein 6.5 - 8.1 g/dL - - -  Total Bilirubin 0.3 - 1.2 mg/dL - - -  Alkaline Phos 38 - 126 U/L - - -  AST 15 - 41 U/L - - -  ALT 0 - 44 U/L - - -    CXR: -  _______________________________________________________________  Assessment and Plan: POD 5 s/p AVR, root enlargment, MAZE, atriclip  Neuro: pain control CV: on coumadin.  INR 4.  Holding coumadin today.  Will restart tomorrow 2.5 Pulm: pulm hygiene.   Renal: creat down, great uop.  Continue diuresis GI: on diet Heme: stable ID: afebrile Endo: SSI Dispo: floor today   4.4(I 11/02/2021 10:36 AM

## 2021-11-03 ENCOUNTER — Other Ambulatory Visit: Payer: 59

## 2021-11-03 LAB — CBC
HCT: 32.1 % — ABNORMAL LOW (ref 39.0–52.0)
Hemoglobin: 10.2 g/dL — ABNORMAL LOW (ref 13.0–17.0)
MCH: 29.2 pg (ref 26.0–34.0)
MCHC: 31.8 g/dL (ref 30.0–36.0)
MCV: 92 fL (ref 80.0–100.0)
Platelets: 150 10*3/uL (ref 150–400)
RBC: 3.49 MIL/uL — ABNORMAL LOW (ref 4.22–5.81)
RDW: 13.1 % (ref 11.5–15.5)
WBC: 7.1 10*3/uL (ref 4.0–10.5)
nRBC: 0 % (ref 0.0–0.2)

## 2021-11-03 LAB — BASIC METABOLIC PANEL
Anion gap: 5 (ref 5–15)
BUN: 27 mg/dL — ABNORMAL HIGH (ref 6–20)
CO2: 32 mmol/L (ref 22–32)
Calcium: 8.6 mg/dL — ABNORMAL LOW (ref 8.9–10.3)
Chloride: 100 mmol/L (ref 98–111)
Creatinine, Ser: 0.86 mg/dL (ref 0.61–1.24)
GFR, Estimated: >60 mL/min (ref >60)
Glucose, Bld: 116 mg/dL — ABNORMAL HIGH (ref 70–99)
Potassium: 4.1 mmol/L (ref 3.5–5.1)
Sodium: 137 mmol/L (ref 135–145)

## 2021-11-03 LAB — GLUCOSE, CAPILLARY
Glucose-Capillary: 112 mg/dL — ABNORMAL HIGH (ref 70–99)
Glucose-Capillary: 115 mg/dL — ABNORMAL HIGH (ref 70–99)
Glucose-Capillary: 121 mg/dL — ABNORMAL HIGH (ref 70–99)
Glucose-Capillary: 126 mg/dL — ABNORMAL HIGH (ref 70–99)
Glucose-Capillary: 135 mg/dL — ABNORMAL HIGH (ref 70–99)
Glucose-Capillary: 85 mg/dL (ref 70–99)

## 2021-11-03 LAB — PROTIME-INR
INR: 4 — ABNORMAL HIGH (ref 0.8–1.2)
Prothrombin Time: 38.9 seconds — ABNORMAL HIGH (ref 11.4–15.2)

## 2021-11-03 MED ORDER — METOPROLOL TARTRATE 25 MG PO TABS
25.0000 mg | ORAL_TABLET | Freq: Two times a day (BID) | ORAL | Status: DC
  Administered 2021-11-03 – 2021-11-04 (×3): 25 mg via ORAL
  Filled 2021-11-03 (×3): qty 1

## 2021-11-03 NOTE — Progress Notes (Signed)
Per Dr Cliffton Asters, leave IJ in place d/t INR:4.0.

## 2021-11-03 NOTE — Progress Notes (Addendum)
[address]       [place] [id number]             [phone]      6 Days Post-Op Procedure(s) (LRB): AORTIC VALVE REPLACEMENT (AVR) WITH INSPIRIS RESILIA AORTIC VALVE SIZE (N/A) MAZE (N/A) CLIPPING OF ATRIAL APPENDAGE WITH ATRICLIP ACHV45 (N/A) TRANSESOPHAGEAL ECHOCARDIOGRAM (TEE) (N/A) APPLICATION OF CELL SAVER AORTIC ROOT ENLARGEMENT WITH HEMASHIELD PLATINUM X 30CM Subjective: Up in the bedside chair, feels like he is progressing and has no new concerns. Pain controlled. Tolerating PO's, no BM yet.   Objective: Vital signs in last 24 hours: Temp:  [97.8 F (36.6 C)-98.3 F (36.8 C)] 98.3 F (36.8 C) (12/05 0400) Pulse Rate:  [69-81] 72 (12/05 0600) Cardiac Rhythm: Atrial fibrillation (12/05 0400) Resp:  [7-19] 17 (12/05 0600) BP: (123-173)/(68-96) 145/90 (12/05 0600) SpO2:  [97 %-100 %] 100 % (12/05 0600) Weight:  [106.2 kg] 106.2 kg (12/05 0500)  Hemodynamic parameters for last 24 hours: CVP:  [17 mmHg-22 mmHg] 21 mmHg  Intake/Output from previous day: 12/04 0701 - 12/05 0700 In: 718 [P.O.:718] Out: 1800 [Urine:1800] Intake/Output this shift: No intake/output data recorded.  General appearance: alert, cooperative, and no distress Neurologic: intact Heart: atrial fibrillation with VR in the 70's-80. Lungs: breath sounds are clear, on RA with adequate O2 sats.  Abdomen: firm, NT, active bowel sounds Extremities: mild LE edama Wound: the sternal incision is intact and dry.   Lab Results: Recent Labs    11/02/21 0417 11/03/21 0433  WBC 8.1 7.1  HGB 9.9* 10.2*  HCT 30.5* 32.1*  PLT 136* 150   BMET:  Recent Labs    11/02/21 0417 11/03/21 0433  NA 139 137  K 3.9 4.1  CL 100 100  CO2 33* 32  GLUCOSE 86 116*  BUN 40* 27*  CREATININE 1.17 0.86  CALCIUM 8.4* 8.6*    PT/INR:  Recent Labs    11/03/21 0433  LABPROT 38.9*  INR 4.0*   ABG    Component Value Date/Time   PHART 7.373 10/29/2021 0755   HCO3 24.0  10/29/2021 0755   TCO2 25 10/29/2021 0755   ACIDBASEDEF 1.0 10/29/2021 0755   O2SAT 100.0 10/29/2021 0755   CBG (last 3)  Recent Labs    11/02/21 2331 11/03/21 0306 11/03/21 0620  GLUCAP 113* 121* 85    Assessment/Plan: S/P Procedure(s) (LRB): AORTIC VALVE REPLACEMENT (AVR) WITH INSPIRIS RESILIA AORTIC VALVE SIZE (N/A) MAZE (N/A) CLIPPING OF ATRIAL APPENDAGE WITH ATRICLIP ACHV45 (N/A) TRANSESOPHAGEAL ECHOCARDIOGRAM (TEE) (N/A) APPLICATION OF CELL SAVER AORTIC ROOT ENLARGEMENT WITH HEMASHIELD PLATINUM X 30CM  -POD6 bioprosthetic AVR  / MAZE / LA clip for for native bicuspid aortic valve stenosis and persistent atrial fibrillation.  Progressing well overall. Continue working on mobility.  -Atrial fibrillation- present prior to surgery. Rate is well controlled.  Now s/p MAZE and LA clip, on amiodarone and Coumadin. INR 4.0 x 2 days. Holding Coumadin for a second day.   -HTN- SBP 160-170 this morning. Increase the metoprolol to 25mg  BID.   -ENDO- h/o type 2 DM- glucose well controlled on Levemir and SSI  -HEME- Expected acute blood loss anemia. Hct 32 and trending up.   -RENAL- normal function. He has been diuresis but is still ~5kg +. Continue Lasix  -PULM- On RA, continue working on pulm hygiene.   -Disposition- transfer to progressive care when bed available. Planning eventual discharge to home with his daughter in [place].  LOS: 6 days    Leary Roca, [place] [phone] 11/03/2021  Agree with above Holding coumadin Awaiting floor bed  Demarkis Gheen O Norah Devin

## 2021-11-03 NOTE — Progress Notes (Signed)
CARDIAC REHAB PHASE I   PRE:  Rate/Rhythm: 73 afib    BP: sitting 153/69    SaO2: 100 RA  MODE:  Ambulation: 820 ft   POST:  Rate/Rhythm: 96 afib    BP: sitting 167/80     SaO2: 96 RA  Pt moved out of bed independently from raised head position. Ambulated with RW, slow and steady. C/o nausea and rested x2 but increased distance. Tired after walk, to recliner. Progressing well, encouraged another walk and IS.  [phone]   Marcus Hicks CES, ACSM 11/03/2021 10:37 AM

## 2021-11-03 NOTE — Progress Notes (Signed)
CARDIAC REHAB PHASE I   PRE:  Rate/Rhythm: 70 SR    BP: sitting 145/72    SaO2: 100 RA  MODE:  Ambulation: 380 ft   POST:  Rate/Rhythm: 75 ? SR with PACs    BP: sitting to bathroom     SaO2:   Tolerated well, moving independently with RW. Encouraged pt to try ambulation without RW. C/o stomach upset, to BR after walk.  [phone]   Harriet Masson CES, ACSM 11/03/2021 2:05 PM

## 2021-11-03 NOTE — Progress Notes (Signed)
Patient ID: Marcus Hicks, male   DOB: Jun 04, 1966, 55 y.o.   MRN: [id number] TCTS Evening Rounds:  Hemodynamically stable in AF with controlled rate 70's  Sats 99%  Awaiting transfer bed.

## 2021-11-04 ENCOUNTER — Inpatient Hospital Stay (HOSPITAL_COMMUNITY): Payer: 59

## 2021-11-04 ENCOUNTER — Telehealth: Payer: Self-pay | Admitting: Cardiology

## 2021-11-04 LAB — GLUCOSE, CAPILLARY
Glucose-Capillary: 71 mg/dL (ref 70–99)
Glucose-Capillary: 150 mg/dL — ABNORMAL HIGH (ref 70–99)
Glucose-Capillary: 152 mg/dL — ABNORMAL HIGH (ref 70–99)
Glucose-Capillary: 141 mg/dL — ABNORMAL HIGH (ref 70–99)

## 2021-11-04 LAB — PROTIME-INR
INR: 3.1 — ABNORMAL HIGH (ref 0.8–1.2)
Prothrombin Time: 31.5 seconds — ABNORMAL HIGH (ref 11.4–15.2)

## 2021-11-04 MED ORDER — ASPIRIN 81 MG PO TBEC: 81.0000 mg | DELAYED_RELEASE_TABLET | Freq: Every day | ORAL | 11 refills | Status: DC

## 2021-11-04 MED ORDER — VICTOZA 18 MG/3ML ~~LOC~~ SOPN: 1.2000 mg | PEN_INJECTOR | Freq: Every morning | SUBCUTANEOUS | Status: DC

## 2021-11-04 MED ORDER — METFORMIN HCL 500 MG PO TABS: 500.0000 mg | ORAL_TABLET | Freq: Two times a day (BID) | ORAL | Status: DC

## 2021-11-04 MED ORDER — LOSARTAN POTASSIUM 25 MG PO TABS
25.0000 mg | ORAL_TABLET | Freq: Every day | ORAL | Status: DC
  Administered 2021-11-04: 25 mg via ORAL
  Filled 2021-11-04: qty 1

## 2021-11-04 MED ORDER — FUROSEMIDE 40 MG PO TABS
40.0000 mg | ORAL_TABLET | Freq: Every day | ORAL | 0 refills | Status: DC
Start: 2021-11-04 — End: 2021-12-25

## 2021-11-04 MED ORDER — POTASSIUM CHLORIDE CRYS ER 20 MEQ PO TBCR
40.0000 meq | EXTENDED_RELEASE_TABLET | Freq: Every day | ORAL | 14 refills | Status: DC
Start: 2021-11-04 — End: 2022-04-08

## 2021-11-04 MED ORDER — TRAMADOL HCL 50 MG PO TABS: 50.0000 mg | ORAL_TABLET | Freq: Four times a day (QID) | ORAL | 0 refills | Status: AC | PRN

## 2021-11-04 MED ORDER — AMIODARONE HCL 400 MG PO TABS: 400.0000 mg | ORAL_TABLET | Freq: Two times a day (BID) | ORAL | 1 refills | Status: DC

## 2021-11-04 MED ORDER — WARFARIN SODIUM 2 MG PO TABS: 2.0000 mg | ORAL_TABLET | Freq: Every day | ORAL | 1 refills | Status: DC

## 2021-11-04 MED ORDER — METOPROLOL TARTRATE 25 MG PO TABS: 25.0000 mg | ORAL_TABLET | Freq: Two times a day (BID) | ORAL | 1 refills | Status: DC

## 2021-11-04 MED ORDER — ALBUTEROL SULFATE (2.5 MG/3ML) 0.083% IN NEBU: 2.5000 mg | INHALATION_SOLUTION | Freq: Four times a day (QID) | RESPIRATORY_TRACT | Status: AC | PRN

## 2021-11-04 MED ORDER — TRESIBA FLEXTOUCH 100 UNIT/ML ~~LOC~~ SOPN: 20.0000 [IU] | PEN_INJECTOR | Freq: Every day | SUBCUTANEOUS | Status: DC

## 2021-11-04 MED ORDER — TRESIBA FLEXTOUCH 100 UNIT/ML ~~LOC~~ SOPN: 62.0000 [IU] | PEN_INJECTOR | Freq: Every day | SUBCUTANEOUS | Status: DC

## 2021-11-04 MED ORDER — LOSARTAN POTASSIUM 25 MG PO TABS: 25.0000 mg | ORAL_TABLET | Freq: Every day | ORAL | 1 refills | Status: DC

## 2021-11-04 NOTE — Discharge Instructions (Signed)

## 2021-11-04 NOTE — Progress Notes (Addendum)
[address]       [place] [address]             [phone]      7 Days Post-Op Procedure(s) (LRB): AORTIC VALVE REPLACEMENT (AVR) WITH INSPIRIS RESILIA AORTIC VALVE SIZE (N/A) MAZE (N/A) CLIPPING OF ATRIAL APPENDAGE WITH ATRICLIP ACHV45 (N/A) TRANSESOPHAGEAL ECHOCARDIOGRAM (TEE) (N/A) APPLICATION OF CELL SAVER AORTIC ROOT ENLARGEMENT WITH HEMASHIELD PLATINUM X 30CM Subjective: Feels well  Objective: Vital signs in last 24 hours: Temp:  [98.1 F (36.7 C)-98.6 F (37 C)] 98.1 F (36.7 C) (12/06 0700) Pulse Rate:  [68-83] 69 (12/06 0500) Cardiac Rhythm: Normal sinus rhythm (12/06 0400) Resp:  [11-19] 11 (12/06 0600) BP: (117-176)/(72-94) 153/75 (12/06 0600) SpO2:  [74 %-100 %] 91 % (12/06 0500) Weight:  [035 kg] 107 kg (12/06 0524)  Hemodynamic parameters for last 24 hours:    Intake/Output from previous day: 12/05 0701 - 12/06 0700 In: 480 [P.O.:480] Out: 4 [Urine:3; Stool:1] Intake/Output this shift: No intake/output data recorded.  General appearance: alert, cooperative, and no distress Heart: regular rate and rhythm, no rub, and no murmur Lungs: mildly dim in lower fields Abdomen: benign Extremities: minor edema Wound: incis healing well  Lab Results: Recent Labs    11/02/21 0417 11/03/21 0433  WBC 8.1 7.1  HGB 9.9* 10.2*  HCT 30.5* 32.1*  PLT 136* 150   BMET:  Recent Labs    11/02/21 0417 11/03/21 0433  NA 139 137  K 3.9 4.1  CL 100 100  CO2 33* 32  GLUCOSE 86 116*  BUN 40* 27*  CREATININE 1.17 0.86  CALCIUM 8.4* 8.6*    PT/INR:  Recent Labs    11/04/21 0505  LABPROT 31.5*  INR 3.1*   ABG    Component Value Date/Time   PHART 7.373 10/29/2021 0755   HCO3 24.0 10/29/2021 0755   TCO2 25 10/29/2021 0755   ACIDBASEDEF 1.0 10/29/2021 0755   O2SAT 100.0 10/29/2021 0755   CBG (last 3)  Recent Labs    11/03/21 2341 11/04/21 0512 11/04/21 0706  GLUCAP 112* 71 150*    Meds Scheduled Meds:   amiodarone  400 mg Oral BID   aspirin EC  81 mg Oral Daily   bisacodyl  10 mg Oral Daily   Or   bisacodyl  10 mg Rectal Daily   Chlorhexidine Gluconate Cloth  6 each Topical Daily   docusate sodium  200 mg Oral Daily   furosemide  40 mg Oral Daily   insulin aspart  0-24 Units Subcutaneous Q4H   insulin detemir  15 Units Subcutaneous Q12H   mouth rinse  15 mL Mouth Rinse BID   metoprolol tartrate  25 mg Oral BID   pantoprazole  40 mg Oral Daily   polyethylene glycol  17 g Oral Daily   potassium chloride  40 mEq Oral Daily   sodium chloride flush  10-40 mL Intracatheter Q12H   sodium chloride flush  3 mL Intravenous Q12H   sodium chloride flush  3 mL Intravenous Q12H   umeclidinium-vilanterol  1 puff Inhalation Daily   Warfarin - Physician Dosing Inpatient   Does not apply q1600   Continuous Infusions:  sodium chloride     sodium chloride     sodium chloride     sodium chloride     lactated ringers     lactated ringers Stopped (10/30/21 1610)   promethazine (PHENERGAN) injection (IM or IVPB) Stopped (10/31/21 0335)  PRN Meds:.sodium chloride, sodium chloride, ALPRAZolam, dextrose, HYDROmorphone (DILAUDID) injection, HYDROmorphone, levalbuterol **AND** ipratropium, metoprolol tartrate, ondansetron (ZOFRAN) IV, promethazine (PHENERGAN) injection (IM or IVPB), sodium chloride flush, sodium chloride flush, sodium chloride flush, traMADol  Xrays No results found.  Assessment/Plan: S/P Procedure(s) (LRB): AORTIC VALVE REPLACEMENT (AVR) WITH INSPIRIS RESILIA AORTIC VALVE SIZE (N/A) MAZE (N/A) CLIPPING OF ATRIAL APPENDAGE WITH ATRICLIP ACHV45 (N/A) TRANSESOPHAGEAL ECHOCARDIOGRAM (TEE) (N/A) APPLICATION OF CELL SAVER AORTIC ROOT ENLARGEMENT WITH HEMASHIELD PLATINUM X 30CM  1 afeb, s BP 117-176, mostly somewhat elevated, was on entresto preop- will start low dose ARB since renal function now in normal range 2 sats ok on RA 3 voiding well chest x-ray shows small bilateral  effusions, will continue Lasix as outpatient for 14 days and can be reevaluated as outpatient. 4 INR 3.1- Per MD home on 2 mg , check INR Thursday- it is scheduled 5 no other new labs 6 blood sugars adequate control we will resume preop diabetic medication regimen.  We will need close follow-up as outpatient.  Most recent hemoglobin A1c 8.0.  We will reduce dose of Tresiba 7.  Stable for discharge     LOS: 7 days    Rowe Clack PA-C Pager [phone] 11/04/2021

## 2021-11-04 NOTE — Progress Notes (Signed)
Pt placed on cpap for the night. °

## 2021-11-04 NOTE — Plan of Care (Signed)
  Problem: Education: Goal: Knowledge of General Education information will improve Description: Including pain rating scale, medication(s)/side effects and non-pharmacologic comfort measures Outcome: Progressing   Problem: Health Behavior/Discharge Planning: Goal: Ability to manage health-related needs will improve Outcome: Progressing   Problem: Activity: Goal: Risk for activity intolerance will decrease Outcome: Progressing   Problem: Nutrition: Goal: Adequate nutrition will be maintained Outcome: Progressing   Problem: Coping: Goal: Level of anxiety will decrease Outcome: Progressing   Problem: Elimination: Goal: Will not experience complications related to bowel motility Outcome: Progressing Goal: Will not experience complications related to urinary retention Outcome: Progressing   Problem: Pain Managment: Goal: General experience of comfort will improve Outcome: Progressing   Problem: Safety: Goal: Ability to remain free from injury will improve Outcome: Progressing   

## 2021-11-04 NOTE — Progress Notes (Signed)
CARDIAC REHAB PHASE I   PRE:  Rate/Rhythm: 75 afib    BP: sitting 139/73    SaO2:   MODE:  Ambulation: 370 ft   POST:  Rate/Rhythm: 90 afib    BP: sitting 183/89     SaO2:   Ambulated with AD or assist. Tolerated well but BP elevated. Discussed IS, sternal precautions, diet, exercise, and CRPII. Pt receptive. Will refer to [facility]. Declined d/c video. [phone]   Harriet Masson CES, ACSM 11/04/2021 10:09 AM

## 2021-11-04 NOTE — Telephone Encounter (Signed)
It looks like this medication was ordered and is being supervised by Dr. Cliffton Asters.   Tried calling patient. No answer and no voicemail set up for me to leave a message.

## 2021-11-04 NOTE — Telephone Encounter (Signed)
   Pt is calling, he said he is confused about his appt on 12/08 and who prescribed coumadin. He would like to get cb from nurse tomorrow morning

## 2021-11-04 NOTE — Progress Notes (Signed)
Discharged to home with daughter. No changes. Discharge instructions given and questions answered.

## 2021-11-05 NOTE — Telephone Encounter (Signed)
Called pt back and has a full VM.

## 2021-11-05 NOTE — Telephone Encounter (Signed)
Spoke with pt and answered all questions regarding his appointments. Pt verbalized understanding and had no additional questions.

## 2021-11-06 ENCOUNTER — Ambulatory Visit (INDEPENDENT_AMBULATORY_CARE_PROVIDER_SITE_OTHER): Payer: 59

## 2021-11-06 ENCOUNTER — Other Ambulatory Visit: Payer: Self-pay

## 2021-11-06 DIAGNOSIS — Z5181 Encounter for therapeutic drug level monitoring: Secondary | ICD-10-CM | POA: Diagnosis not present

## 2021-11-06 DIAGNOSIS — I4891 Unspecified atrial fibrillation: Secondary | ICD-10-CM | POA: Diagnosis not present

## 2021-11-06 DIAGNOSIS — Z7901 Long term (current) use of anticoagulants: Secondary | ICD-10-CM | POA: Diagnosis not present

## 2021-11-06 DIAGNOSIS — Z952 Presence of prosthetic heart valve: Secondary | ICD-10-CM | POA: Diagnosis not present

## 2021-11-06 DIAGNOSIS — E1142 Type 2 diabetes mellitus with diabetic polyneuropathy: Secondary | ICD-10-CM

## 2021-11-06 LAB — POCT INR: INR: 1.9 — AB (ref 2.0–3.0)

## 2021-11-06 MED ORDER — CONTOUR NEXT TEST VI STRP: ORAL_STRIP | 12 refills | Status: DC

## 2021-11-06 MED ORDER — ALPRAZOLAM 0.5 MG PO TABS: 0.5000 mg | ORAL_TABLET | Freq: Three times a day (TID) | ORAL | 3 refills | Status: DC | PRN

## 2021-11-06 NOTE — Patient Instructions (Addendum)
Description   Continue taking 1 tablet (2mg ) daily  Recheck INR in 1 week.  Stay consistent with greens weekly (2-3 times per week). Coumadin Clinic [phone]   Amio 400mg  bid until 11/09/21 and then Amio 400mg  daily.      A full discussion of the nature of anticoagulants has been carried out.  A benefit risk analysis has been presented to the patient, so that they understand the justification for choosing anticoagulation at this time. The need for frequent and regular monitoring, precise dosage adjustment and compliance is stressed.  Side effects of potential bleeding are discussed.  The patient should avoid any OTC items containing aspirin or ibuprofen, and should avoid great swings in general diet.  Avoid alcohol consumption.  Call if any signs of abnormal bleeding.

## 2021-11-06 NOTE — Telephone Encounter (Signed)
Patient had heart surgery x1 wk ago. He is staying with family in [place], [place]. He is requesting following refills sent to local pharmacy to his location.   Medications: xanax and test strips.   Lorita Officer, [place] 11/06/21 1:38 PM

## 2021-11-07 ENCOUNTER — Telehealth (HOSPITAL_COMMUNITY): Payer: Self-pay

## 2021-11-07 ENCOUNTER — Telehealth: Payer: Self-pay | Admitting: *Deleted

## 2021-11-07 NOTE — Telephone Encounter (Signed)
Marcus Hicks contacted the office c/o a small lump at the top of his sternal incision. Per patient he has noticed it since hospital discharge. Denies tenderness/pain, redness, warmth, or drainage. Advised patient that sometimes a knot will form after surgery. Advised patient to continue to monitor the area and to call our office if he notices any changes. Patient verbalizes understanding.

## 2021-11-07 NOTE — Telephone Encounter (Signed)
Per phase I cardiac rehab, fax cardiac rehab referral to [place] [facility]. 

## 2021-11-08 ENCOUNTER — Other Ambulatory Visit: Payer: Self-pay | Admitting: Legal Medicine

## 2021-11-12 ENCOUNTER — Ambulatory Visit (INDEPENDENT_AMBULATORY_CARE_PROVIDER_SITE_OTHER): Payer: 59

## 2021-11-12 ENCOUNTER — Other Ambulatory Visit: Payer: Self-pay

## 2021-11-12 DIAGNOSIS — Z952 Presence of prosthetic heart valve: Secondary | ICD-10-CM | POA: Diagnosis not present

## 2021-11-12 DIAGNOSIS — Z7901 Long term (current) use of anticoagulants: Secondary | ICD-10-CM

## 2021-11-12 DIAGNOSIS — I4891 Unspecified atrial fibrillation: Secondary | ICD-10-CM

## 2021-11-12 LAB — POCT INR: INR: 1.3 — AB (ref 2.0–3.0)

## 2021-11-12 NOTE — Patient Instructions (Signed)
-   take 2 tablets tonight and tomorrow, then - START NEW DOSAGE of warfarin 1 tablet (2 mg) every day EXCEPT 2 tablets (4 mg) on Mondays - Recheck INR in 1 week.  Stay consistent with greens weekly (2-3 times per week).  Coumadin Clinic [phone]

## 2021-11-14 ENCOUNTER — Other Ambulatory Visit: Payer: Self-pay

## 2021-11-14 ENCOUNTER — Ambulatory Visit (INDEPENDENT_AMBULATORY_CARE_PROVIDER_SITE_OTHER): Payer: Self-pay | Admitting: Thoracic Surgery (Cardiothoracic Vascular Surgery)

## 2021-11-14 DIAGNOSIS — I4819 Other persistent atrial fibrillation: Secondary | ICD-10-CM

## 2021-11-14 DIAGNOSIS — I350 Nonrheumatic aortic (valve) stenosis: Secondary | ICD-10-CM

## 2021-11-14 LAB — ECHO INTRAOPERATIVE TEE
Height: 65 in
Weight: 3584 oz

## 2021-11-14 NOTE — Progress Notes (Signed)
°   °  [address]       [name] [address]             [phone]       Patient: Home Provider: Office Consent for Telemedicine visit obtained.  Todays visit was completed via a real-time telehealth (see specific modality noted below). The patient/authorized person provided oral consent at the time of the visit to engage in a telemedicine encounter with the present provider at [facility]. The patient/authorized person was informed of the potential benefits, limitations, and risks of telemedicine. The patient/authorized person expressed understanding that the laws that protect confidentiality also apply to telemedicine. The patient/authorized person acknowledged understanding that telemedicine does not provide emergency services and that he or she would need to call 911 or proceed to the nearest hospital for help if such a need arose.   Total time spent in the clinical discussion 10 minutes.  Telehealth Modality: Phone visit (audio only)  I had a telephone visit with  Marcus Hicks who is s/p mechanical aortic valve replacement and biatrial Maze procedure..  Overall doing well.  He states that his breathing is getting better day by day.  His weight is down to 214 pounds from 237 pounds after discharge.  He has not been checking his heart rate.  He has returned home after staying with his daughter for few weeks after being discharged.  Pain is minimal.  Ambulating well.  Marcus Hicks will see [place] back in 1 month with a chest x-ray for cardiac rehab clearance.  He will also need to follow-up with electrophysiology to check his rhythm.  Hopefully he can be taken off blood thinners if he is in sinus rhythm.  [name] Marcus Hicks

## 2021-11-17 ENCOUNTER — Telehealth: Payer: Self-pay | Admitting: *Deleted

## 2021-11-17 NOTE — Telephone Encounter (Signed)
Returned phone call to Mr. Heinzman regarding flex pen dosage. Patient states he was on 63 units of Tresiba prior to surgery. At discharge medication was dropped to 20 units. Patient states he is now eating more and is wondering when he needs to go back to his pre surgery dose. Advised patient to call PCP for blood sugar management. Patient verbalized understanding.

## 2021-11-18 ENCOUNTER — Ambulatory Visit (INDEPENDENT_AMBULATORY_CARE_PROVIDER_SITE_OTHER): Payer: 59

## 2021-11-18 ENCOUNTER — Other Ambulatory Visit: Payer: Self-pay

## 2021-11-18 DIAGNOSIS — Z7901 Long term (current) use of anticoagulants: Secondary | ICD-10-CM

## 2021-11-18 DIAGNOSIS — I4891 Unspecified atrial fibrillation: Secondary | ICD-10-CM

## 2021-11-18 DIAGNOSIS — Z952 Presence of prosthetic heart valve: Secondary | ICD-10-CM

## 2021-11-18 LAB — POCT INR: INR: 1.5 — AB (ref 2.0–3.0)

## 2021-11-18 NOTE — Patient Instructions (Signed)
Description   - take 2 tablets tonight, then - START NEW DOSAGE of warfarin 1 tablet (2 mg) every day EXCEPT 2 tablets (4 mg) on Mondays and Thursdays  - Recheck INR in 1 week.  Stay consistent with greens weekly (2-3 times per week).  Coumadin Clinic [phone]  Amio 400mg  daily.

## 2021-11-19 ENCOUNTER — Other Ambulatory Visit: Payer: Self-pay | Admitting: Legal Medicine

## 2021-11-19 ENCOUNTER — Telehealth: Payer: Self-pay

## 2021-11-19 DIAGNOSIS — J449 Chronic obstructive pulmonary disease, unspecified: Secondary | ICD-10-CM

## 2021-11-19 MED ORDER — UMECLIDINIUM-VILANTEROL 62.5-25 MCG/ACT IN AEPB: 1.0000 | INHALATION_SPRAY | Freq: Every day | RESPIRATORY_TRACT | 6 refills | Status: DC

## 2021-11-20 ENCOUNTER — Other Ambulatory Visit: Payer: Self-pay | Admitting: Legal Medicine

## 2021-11-20 ENCOUNTER — Telehealth: Payer: Self-pay | Admitting: Cardiology

## 2021-11-20 DIAGNOSIS — E1142 Type 2 diabetes mellitus with diabetic polyneuropathy: Secondary | ICD-10-CM

## 2021-11-20 NOTE — Telephone Encounter (Signed)
Pt c/o BP issue: STAT if pt c/o blurred vision, one-sided weakness or slurred speech  1. What are your last 5 BP readings?  138/31 67  127/72 75 146/96 93 154/98 93  2. Are you having any other symptoms (ex. Dizziness, headache, blurred vision, passed out)? no  3. What is your BP issue? Bp is elevated

## 2021-11-20 NOTE — Telephone Encounter (Signed)
Spoke with pt 138/31 hr 67 and 127/72 hr 75 were taken 11/19/21 pm. 146/96 hr 93, 154/98 HR 93 and 156/94 HR 92 were this am before and 1 hour after medication. I have advised pt to keep a log of BP and HR today and tomorrow 2 hours after his medication so that we can evaluate the medication effect on his BP. Pt states that he will do that and call 12/23 with information. Pt states that he has no sx at this time. Just FYI

## 2021-11-21 MED ORDER — AMIODARONE HCL 400 MG PO TABS: 400.0000 mg | ORAL_TABLET | Freq: Every day | ORAL | 0 refills | Status: DC

## 2021-11-21 NOTE — Telephone Encounter (Signed)
Spoke with pt today and he states that his BP is 124/83 but HR is 93. Pt states that he is almost out of his Amiodarone as he did not realize that his daughter had him taking it twice a day more than 5 days. How do you advise?

## 2021-11-21 NOTE — Addendum Note (Signed)
Addended by: Eleonore Chiquito on: 11/21/2021 11:07 AM   Modules accepted: Orders

## 2021-11-25 ENCOUNTER — Other Ambulatory Visit: Payer: Self-pay

## 2021-11-25 ENCOUNTER — Ambulatory Visit (INDEPENDENT_AMBULATORY_CARE_PROVIDER_SITE_OTHER): Payer: 59

## 2021-11-25 DIAGNOSIS — I4891 Unspecified atrial fibrillation: Secondary | ICD-10-CM

## 2021-11-25 DIAGNOSIS — Z7901 Long term (current) use of anticoagulants: Secondary | ICD-10-CM

## 2021-11-25 DIAGNOSIS — Z952 Presence of prosthetic heart valve: Secondary | ICD-10-CM

## 2021-11-25 DIAGNOSIS — Z5181 Encounter for therapeutic drug level monitoring: Secondary | ICD-10-CM

## 2021-11-25 LAB — POCT INR: INR: 1.3 — AB (ref 2.0–3.0)

## 2021-11-25 MED ORDER — WARFARIN SODIUM 2 MG PO TABS: 2.0000 mg | ORAL_TABLET | Freq: Every day | ORAL | 1 refills | Status: DC

## 2021-11-25 NOTE — Patient Instructions (Signed)
Description   - take 2 tablets tonight, then - START NEW DOSAGE of warfarin 2 tablets every day EXCEPT 1 tablet on Mondays, Wednesdays, and Fridays.  - Recheck INR in 1 week.  Stay consistent with greens weekly (2-3 times per week).  [facility] [phone]  Amio 200mg  daily.

## 2021-11-25 NOTE — Telephone Encounter (Signed)
Prescription refill request received for warfarin [place]: 10/15/21 [facility]) Next INR check: 12/02/21 Warfarin tablet strength: 2mg   Appropriate dose and refill sent to requested pharmacy.

## 2021-12-02 ENCOUNTER — Encounter: Payer: Self-pay | Admitting: Legal Medicine

## 2021-12-02 ENCOUNTER — Telehealth (INDEPENDENT_AMBULATORY_CARE_PROVIDER_SITE_OTHER): Payer: 59 | Admitting: Legal Medicine

## 2021-12-02 VITALS — BP 109/78 | HR 100 | Temp 97.0°F | Resp 16 | Ht 65.0 in | Wt 235.0 lb

## 2021-12-02 DIAGNOSIS — J029 Acute pharyngitis, unspecified: Secondary | ICD-10-CM | POA: Diagnosis not present

## 2021-12-02 DIAGNOSIS — U071 COVID-19: Secondary | ICD-10-CM | POA: Diagnosis not present

## 2021-12-02 LAB — POCT INFLUENZA A/B
Influenza A, POC: NEGATIVE
Influenza B, POC: NEGATIVE

## 2021-12-02 LAB — POC COVID19 BINAXNOW: SARS Coronavirus 2 Ag: POSITIVE — AB

## 2021-12-02 MED ORDER — NIRMATRELVIR/RITONAVIR (PAXLOVID)TABLET: 3.0000 | ORAL_TABLET | Freq: Two times a day (BID) | ORAL | 0 refills | Status: AC

## 2021-12-02 NOTE — Progress Notes (Signed)
Virtual Visit via Video Note   This visit type was conducted due to national recommendations for restrictions regarding the COVID-19 Pandemic (e.g. social distancing) in an effort to limit this patient's exposure and mitigate transmission in our community.  Due to his co-morbid illnesses, this patient is at least at moderate risk for complications without adequate follow up.  This format is felt to be most appropriate for this patient at this time.  All issues noted in this document were discussed and addressed.  A limited physical exam was performed with this format.  A verbal consent was obtained for the virtual visit.   Date:  12/02/2021   ID:  Marcus Hicks, DOB Oct 08, 1966, MRN [id number]  Patient Location: Home Provider Location: Office/Clinic  PCP:  Lillard Anes, MD   Evaluation Performed:  New Patient Evaluation  Chief Complaint:  covid  History of Present Illness:    Marcus Hicks is a 56 y.o. male with patient is covid positive today. He recently has AVR. Dec 31.  Eating, no fever. Positive cough.  The patient does have symptoms concerning for COVID-19 infection (fever, chills, cough, or new shortness of breath).    Past Medical History:  Diagnosis Date   Atrial fibrillation with rapid ventricular response ([facility]us [facility]) 08/12/2020   Bicuspid aortic valve    CHF (congestive heart failure) (HCC)    Chronic hepatitis C virus infection (Marcus Hicks) 08/03/2013   COPD (chronic obstructive pulmonary disease) (HCC)    Coronary artery disease    Diabetes mellitus type 2, insulin dependent (Marcus Hicks)    Dysrhythmia    A Fib   Essential hypertension 07/03/2015   Last Assessment & Plan:  Blood pressure is borderline high.  Advised to get better sleep, walk on a regular basis, decrease weight down to about 210 and maintain, and eliminate tobacco.   HFrEF (heart failure with reduced ejection fraction) ([place]) 08/22/2020   Long-term insulin use ([place]) 10/12/2016   Obesity,  unspecified 10/12/2016   Patient advised to lose down to about 210 pounds and maintained.  Regular walking program recommended.  He knows how to lose weight.   Sleep apnea     Past Surgical History:  Procedure Laterality Date   AORTIC ROOT ENLARGEMENT  10/28/2021   Procedure: AORTIC ROOT ENLARGEMENT WITH HEMASHIELD PLATINUM 28MM X 30CM;  Surgeon: Marcus Matte, MD;  Location: [place];  Service: Open Heart Surgery;;   AORTIC VALVE REPLACEMENT N/A 10/28/2021   Procedure: AORTIC VALVE REPLACEMENT (AVR) WITH INSPIRIS RESILIA AORTIC VALVE SIZE 25MM;  Surgeon: Marcus Matte, MD;  Location: [place];  Service: Open Heart Surgery;  Laterality: N/A;   CARDIAC CATHETERIZATION     CARDIOVERSION N/A 09/24/2020   Procedure: CARDIOVERSION;  Surgeon: Marcus [name], MD;  Location: [facility] ENDOSCOPY;  Service: Cardiovascular;  Laterality: N/A;   CARDIOVERSION N/A 08/13/2021   Procedure: CARDIOVERSION;  Surgeon: Marcus Munroe, MD;  Location: [place];  Service: Cardiovascular;  Laterality: N/A;   CCTA  08/2019   CLIPPING OF ATRIAL APPENDAGE N/A 10/28/2021   Procedure: CLIPPING OF ATRIAL APPENDAGE WITH ATRICLIP ACHV45;  Surgeon: Marcus Matte, MD;  Location: [place];  Service: Open Heart Surgery;  Laterality: N/A;   MAZE N/A 10/28/2021   Procedure: MAZE;  Surgeon: Marcus Matte, MD;  Location: [place];  Service: Open Heart Surgery;  Laterality: N/A;   RIGHT/LEFT HEART CATH AND CORONARY ANGIOGRAPHY N/A 09/26/2021   Procedure: RIGHT/LEFT HEART CATH AND CORONARY ANGIOGRAPHY;  Surgeon: Martinique, Peter M,  MD;  Location: [place] CV LAB;  Service: Cardiovascular;  Laterality: N/A;   TEE WITHOUT CARDIOVERSION  09/24/2020   Procedure: TRANSESOPHAGEAL ECHOCARDIOGRAM (TEE);  Surgeon: Marcus [name], MD;  Location: [facility];  Service: Cardiovascular;;   TEE WITHOUT CARDIOVERSION N/A 10/28/2021   Procedure: TRANSESOPHAGEAL ECHOCARDIOGRAM (TEE);  Surgeon: Marcus Matte, MD;  Location: [place];  Service: Open Heart Surgery;  Laterality: N/A;    Family History  Problem Relation Age of Onset   Cancer Maternal Grandfather    Liver disease Sister    Alzheimer's disease Other    Memory loss Other    Hypertension Other     Social History   Socioeconomic History   Marital status: Widowed    Spouse name: Not on file   Number of children: Not on file   Years of education: Not on file   Highest education level: Not on file  Occupational History   Not on file  Tobacco Use   Smoking status: Former   Smokeless tobacco: Former    Types: Snuff   Tobacco comments:    Former snuff 08/11/2021  Vaping Use   Vaping Use: Never used  Substance and Sexual Activity   Alcohol use: Not Currently   Drug use: Never   Sexual activity: Never  Other Topics Concern   Not on file  Social History Narrative   Not on file   Social Determinants of Health   Financial Resource Strain: Not on file  Food Insecurity: Not on file  Transportation Needs: Not on file  Physical Activity: Not on file  Stress: Not on file  Social Connections: Not on file  Intimate Partner Violence: Not on file    Outpatient Medications Prior to Visit  Medication Sig Dispense Refill   albuterol (PROVENTIL) (2.5 MG/3ML) 0.083% nebulizer solution Take 3 mLs (2.5 mg total) by nebulization every 6 (six) hours as needed for wheezing.     ALPRAZolam (XANAX) 0.5 MG tablet Take 1 tablet (0.5 mg total) by mouth 3 (three) times daily as needed for anxiety. 90 tablet 3   amiodarone (PACERONE) 400 MG tablet Take 1 tablet (400 mg total) by mouth daily. 14 tablet 0   aspirin EC 81 MG EC tablet Take 1 tablet (81 mg total) by mouth daily. Swallow whole. 30 tablet 11   furosemide (LASIX) 40 MG tablet Take 1 tablet (40 mg total) by mouth daily. 14 tablet 0   glucose blood (CONTOUR NEXT TEST) test strip Use as instructed 100 each 12   losartan (COZAAR) 25 MG tablet Take 1 tablet (25 mg total) by mouth daily.  30 tablet 1   metFORMIN (GLUCOPHAGE) 500 MG tablet Take 1 tablet (500 mg total) by mouth 2 (two) times daily with a meal. TAKE 1 TABLET(500 MG) BY MOUTH TWICE DAILY WITH A MEAL     metoprolol tartrate (LOPRESSOR) 25 MG tablet Take 1 tablet (25 mg total) by mouth 2 (two) times daily. 60 tablet 1   potassium chloride SA (KLOR-CON Hicks) 20 MEQ tablet Take 2 tablets (40 mEq total) by mouth daily. 20 tablet 14   rosuvastatin (CRESTOR) 40 MG tablet Take 1 tablet (40 mg total) by mouth daily. 90 tablet 3   TRESIBA FLEXTOUCH 100 UNIT/ML FlexTouch Pen Inject 20 Units into the skin daily.     umeclidinium-vilanterol (ANORO ELLIPTA) 62.5-25 MCG/ACT AEPB Inhale 1 puff into the lungs daily at 6 (six) AM. 1 each 6   VICTOZA 18 MG/3ML SOPN ADMINISTER 1.2 MG UNDER  THE SKIN DAILY 12 mL 3   warfarin (COUMADIN) 2 MG tablet Take 1 tablet (2 mg total) by mouth daily. As directed by the coumadin clinic based on blood test(INR) 50 tablet 1   No facility-administered medications prior to visit.    Allergies:   Patient has no known allergies.   Social History   Tobacco Use   Smoking status: Former   Smokeless tobacco: Former    Types: Snuff   Tobacco comments:    Former snuff 08/11/2021  Vaping Use   Vaping Use: Never used  Substance Use Topics   Alcohol use: Not Currently   Drug use: Never     Review of Systems  Constitutional:  Negative for chills and fever.  HENT:  Positive for congestion.   Respiratory:  Positive for cough.   Cardiovascular:  Negative for chest pain and orthopnea.  Gastrointestinal: Negative.   Genitourinary: Negative.   Musculoskeletal:  Positive for myalgias.    Labs/Other Tests and Data Reviewed:    Recent Labs: 06/01/2021: B Natriuretic Peptide 85.2 08/26/2021: NT-Pro BNP 1,398 10/27/2021: ALT 49 10/30/2021: Magnesium 2.8 11/03/2021: BUN 27; Creatinine, Ser 0.86; Hemoglobin 10.2; Platelets 150; Potassium 4.1; Sodium 137   Recent Lipid Panel Lab Results  Component Value  Date/Time   CHOL 184 08/26/2021 03:34 PM   TRIG 186 (H) 08/26/2021 03:34 PM   HDL 42 08/26/2021 03:34 PM   CHOLHDL 4.4 08/26/2021 03:34 PM   LDLCALC 109 (H) 08/26/2021 03:34 PM    Wt Readings from Last 3 Encounters:  12/02/21 235 lb (106.6 kg)  11/04/21 235 lb 14.3 oz (107 kg)  10/27/21 224 lb 6.4 oz (101.8 kg)     Objective:    Vital Signs:  BP 109/78    Pulse 100    Temp (!) 97 F (36.1 C)    Resp 16    Ht 5\' 5"  (1.651 Hicks)    Wt 235 lb (106.6 kg)    SpO2 96%    BMI 39.11 kg/Hicks    Physical Exam review  ASSESSMENT & PLAN:   Diagnoses and all orders for this visit: Sore throat -     POC COVID-19- positive -     Influenza A/B- negative -     nirmatrelvir/ritonavir EUA (PAXLOVID) 20 x 150 MG & 10 x 100MG  TABS; Take 3 tablets by mouth 2 (two) times daily for 5 days. (Take nirmatrelvir 150 mg two tablets twice daily for 5 days and ritonavir 100 mg one tablet twice daily for 5 days) Patient GFR is 52 COVID -     nirmatrelvir/ritonavir EUA (PAXLOVID) 20 x 150 MG & 10 x 100MG  TABS; Take 3 tablets by mouth 2 (two) times daily for 5 days. (Take nirmatrelvir 150 mg two tablets twice daily for 5 days and ritonavir 100 mg one tablet twice daily for 5 days) Patient GFR is 52 Treat Covid with paxlovir     Orders Placed This Encounter  Procedures   POC COVID-19   Influenza A/B       COVID-19 Education: The signs and symptoms of COVID-19 were discussed with the patient and how to seek care for testing (follow up with PCP or arrange E-visit). The importance of social distancing was discussed today.   I spent 20 minutes dedicated to the care of this patient on the date of this encounter to include face-to-face time with the patient, as well as:   Follow Up:  In Person prn  Signed, Reinaldo Meeker, MD  12/02/2021 10:06  AM    [place]

## 2021-12-02 NOTE — Progress Notes (Deleted)
TELEHEALTH VISIT  This visit type was conducted due to national recommendations for restrictions regarding the COVID-19 Pandemic (e.g. social distancing) in an effort to limit this patient's exposure and mitigate transmission in our community.  Due to his co-morbid illnesses, this patient is at least at moderate risk for complications without adequate follow up.  This format is felt to be most appropriate for this patient at this time.  The patient did not have access to video technology/had technical difficulties with video requiring transitioning to audio format only (telephone).  All issues noted in this document were discussed and addressed.  No physical exam could be performed with this format.  Patient verbally consented to a telehealth visit.    Subjective:    Patient ID: Marcus Hicks, male    DOB: 05-10-1966, 56 y.o.   MRN: [id number]  Chief Complaint  Patient presents with   Covid Positive    HPI Patient is in today for sore throat for 3 days. He is denied SOB, Chest pain, fever, body aches, headaches. He mentioned coughing sometimes, nasal congestion. He is concerned that he had Heart surgery on 10/28/2021.  Past Medical History:  Diagnosis Date   Atrial fibrillation with rapid ventricular response ([place]) 08/12/2020   Bicuspid aortic valve    CHF (congestive heart failure) (HCC)    Chronic hepatitis C virus infection ([place]) 08/03/2013   COPD (chronic obstructive pulmonary disease) (HCC)    Coronary artery disease    Diabetes mellitus type 2, insulin dependent ([place])    Dysrhythmia    A Fib   Essential hypertension 07/03/2015   Last Assessment & Plan:  Blood pressure is borderline high.  Advised to get better sleep, walk on a regular basis, decrease weight down to about 210 and maintain, and eliminate tobacco.   HFrEF (heart failure with reduced ejection fraction) ([place]) 08/22/2020   Long-term insulin use ([place]) 10/12/2016   Obesity, unspecified 10/12/2016   Patient  advised to lose down to about 210 pounds and maintained.  Regular walking program recommended.  He knows how to lose weight.   Sleep apnea     Past Surgical History:  Procedure Laterality Date   AORTIC ROOT ENLARGEMENT  10/28/2021   Procedure: AORTIC ROOT ENLARGEMENT WITH HEMASHIELD PLATINUM 28MM X 30CM;  Surgeon: Lajuana Matte, MD;  Location: [place];  Service: Open Heart Surgery;;   AORTIC VALVE REPLACEMENT N/A 10/28/2021   Procedure: AORTIC VALVE REPLACEMENT (AVR) WITH INSPIRIS RESILIA AORTIC VALVE SIZE 25MM;  Surgeon: Lajuana Matte, MD;  Location: [place];  Service: Open Heart Surgery;  Laterality: N/A;   CARDIAC CATHETERIZATION     CARDIOVERSION N/A 09/24/2020   Procedure: CARDIOVERSION;  Surgeon: Donato [name], MD;  Location: [facility] ENDOSCOPY;  Service: Cardiovascular;  Laterality: N/A;   CARDIOVERSION N/A 08/13/2021   Procedure: CARDIOVERSION;  Surgeon: Elouise Munroe, MD;  Location: [place];  Service: Cardiovascular;  Laterality: N/A;   CCTA  08/2019   CLIPPING OF ATRIAL APPENDAGE N/A 10/28/2021   Procedure: CLIPPING OF ATRIAL APPENDAGE WITH ATRICLIP ACHV45;  Surgeon: Lajuana Matte, MD;  Location: [place];  Service: Open Heart Surgery;  Laterality: N/A;   MAZE N/A 10/28/2021   Procedure: MAZE;  Surgeon: Lajuana Matte, MD;  Location: [place];  Service: Open Heart Surgery;  Laterality: N/A;   RIGHT/LEFT HEART CATH AND CORONARY ANGIOGRAPHY N/A 09/26/2021   Procedure: RIGHT/LEFT HEART CATH AND CORONARY ANGIOGRAPHY;  Surgeon: Martinique, Peter M, MD;  Location: [place] CV LAB;  Service: Cardiovascular;  Laterality: N/A;   TEE WITHOUT CARDIOVERSION  09/24/2020   Procedure: TRANSESOPHAGEAL ECHOCARDIOGRAM (TEE);  Surgeon: Donato [name], MD;  Location: [place];  Service: Cardiovascular;;   TEE WITHOUT CARDIOVERSION N/A 10/28/2021   Procedure: TRANSESOPHAGEAL ECHOCARDIOGRAM (TEE);  Surgeon: Lajuana Matte, MD;  Location: [place];  Service:  Open Heart Surgery;  Laterality: N/A;    Family History  Problem Relation Age of Onset   Cancer Maternal Grandfather    Liver disease Sister    Alzheimer's disease Other    Memory loss Other    Hypertension Other     Social History   Socioeconomic History   Marital status: Widowed    Spouse name: Not on file   Number of children: Not on file   Years of education: Not on file   Highest education level: Not on file  Occupational History   Not on file  Tobacco Use   Smoking status: Former   Smokeless tobacco: Former    Types: Snuff   Tobacco comments:    Former snuff 08/11/2021  Vaping Use   Vaping Use: Never used  Substance and Sexual Activity   Alcohol use: Not Currently   Drug use: Never   Sexual activity: Never  Other Topics Concern   Not on file  Social History Narrative   Not on file   Social Determinants of Health   Financial Resource Strain: Not on file  Food Insecurity: Not on file  Transportation Needs: Not on file  Physical Activity: Not on file  Stress: Not on file  Social Connections: Not on file  Intimate Partner Violence: Not on file    Outpatient Medications Prior to Visit  Medication Sig Dispense Refill   albuterol (PROVENTIL) (2.5 MG/3ML) 0.083% nebulizer solution Take 3 mLs (2.5 mg total) by nebulization every 6 (six) hours as needed for wheezing.     ALPRAZolam (XANAX) 0.5 MG tablet Take 1 tablet (0.5 mg total) by mouth 3 (three) times daily as needed for anxiety. 90 tablet 3   amiodarone (PACERONE) 400 MG tablet Take 1 tablet (400 mg total) by mouth daily. 14 tablet 0   aspirin EC 81 MG EC tablet Take 1 tablet (81 mg total) by mouth daily. Swallow whole. 30 tablet 11   furosemide (LASIX) 40 MG tablet Take 1 tablet (40 mg total) by mouth daily. 14 tablet 0   glucose blood (CONTOUR NEXT TEST) test strip Use as instructed 100 each 12   losartan (COZAAR) 25 MG tablet Take 1 tablet (25 mg total) by mouth daily. 30 tablet 1   metFORMIN  (GLUCOPHAGE) 500 MG tablet Take 1 tablet (500 mg total) by mouth 2 (two) times daily with a meal. TAKE 1 TABLET(500 MG) BY MOUTH TWICE DAILY WITH A MEAL     metoprolol tartrate (LOPRESSOR) 25 MG tablet Take 1 tablet (25 mg total) by mouth 2 (two) times daily. 60 tablet 1   potassium chloride SA (KLOR-CON M) 20 MEQ tablet Take 2 tablets (40 mEq total) by mouth daily. 20 tablet 14   rosuvastatin (CRESTOR) 40 MG tablet Take 1 tablet (40 mg total) by mouth daily. 90 tablet 3   TRESIBA FLEXTOUCH 100 UNIT/ML FlexTouch Pen Inject 20 Units into the skin daily.     umeclidinium-vilanterol (ANORO ELLIPTA) 62.5-25 MCG/ACT AEPB Inhale 1 puff into the lungs daily at 6 (six) AM. 1 each 6   VICTOZA 18 MG/3ML SOPN ADMINISTER 1.2 MG UNDER THE SKIN DAILY 12 mL 3  warfarin (COUMADIN) 2 MG tablet Take 1 tablet (2 mg total) by mouth daily. As directed by the coumadin clinic based on blood test(INR) 50 tablet 1   No facility-administered medications prior to visit.    No Known Allergies  '@KCROSBRIEF' @     Objective:    Physical Exam  BP 109/78    Pulse 100    Temp (!) 97 F (36.1 C)    Resp 16    Ht '5\' 5"'  (1.651 m)    Wt 235 lb (106.6 kg)    SpO2 96%    BMI 39.11 kg/m  Wt Readings from Last 3 Encounters:  12/02/21 235 lb (106.6 kg)  11/04/21 235 lb 14.3 oz (107 kg)  10/27/21 224 lb 6.4 oz (101.8 kg)    Health Maintenance Due  Topic Date Due   OPHTHALMOLOGY EXAM  Never done   Zoster Vaccines- Shingrix (1 of 2) Never done   COLONOSCOPY (Pts 45-3yr Insurance coverage will need to be confirmed)  Never done   TETANUS/TDAP  06/11/2016   Pneumococcal Vaccine [id number] old (3 - PPSV23 if available, else PCV20) 02/26/2018    There are no preventive care reminders to display for this patient.   No results found for: TSH Lab Results  Component Value Date   WBC 7.1 11/03/2021   HGB 10.2 (L) 11/03/2021   HCT 32.1 (L) 11/03/2021   MCV 92.0 11/03/2021   PLT 150 11/03/2021   Lab Results  Component  Value Date   NA 137 11/03/2021   K 4.1 11/03/2021   CO2 32 11/03/2021   GLUCOSE 116 (H) 11/03/2021   BUN 27 (H) 11/03/2021   CREATININE 0.86 11/03/2021   BILITOT 0.7 10/27/2021   ALKPHOS 49 10/27/2021   AST 41 10/27/2021   ALT 49 (H) 10/27/2021   PROT 7.4 10/27/2021   ALBUMIN 4.0 10/27/2021   CALCIUM 8.6 (L) 11/03/2021   ANIONGAP 5 11/03/2021   EGFR 99 09/23/2021   Lab Results  Component Value Date   CHOL 184 08/26/2021   Lab Results  Component Value Date   HDL 42 08/26/2021   Lab Results  Component Value Date   LDLCALC 109 (H) 08/26/2021   Lab Results  Component Value Date   TRIG 186 (H) 08/26/2021   Lab Results  Component Value Date   CHOLHDL 4.4 08/26/2021   Lab Results  Component Value Date   HGBA1C 8.0 (H) 10/27/2021       Assessment & Plan:   1. Sore throat *** - POC COVID-19 - Influenza A/B   Orders Placed This Encounter  Procedures   POC COVID-19   Influenza A/B     No orders of the defined types were placed in this encounter.   COVID-19 Education: The signs and symptoms of COVID-19 were discussed with the patient and how to seek care for testing (follow up with PCP or arrange E-visit). The importance of social distancing was discussed today.  Time:   Today, I have spent *** minutes with the patient with telehealth technology discussing the above problems.    Follow Up:  {F/U Format:[id number]} {follow up:15908}  Signed, MAugustin Coupe CMA  12/02/2021 10:02 AM    [place]

## 2021-12-09 ENCOUNTER — Ambulatory Visit (INDEPENDENT_AMBULATORY_CARE_PROVIDER_SITE_OTHER): Payer: 59

## 2021-12-09 ENCOUNTER — Other Ambulatory Visit: Payer: Self-pay

## 2021-12-09 DIAGNOSIS — I4891 Unspecified atrial fibrillation: Secondary | ICD-10-CM | POA: Diagnosis not present

## 2021-12-09 DIAGNOSIS — Z7901 Long term (current) use of anticoagulants: Secondary | ICD-10-CM | POA: Diagnosis not present

## 2021-12-09 DIAGNOSIS — Z5181 Encounter for therapeutic drug level monitoring: Secondary | ICD-10-CM

## 2021-12-09 LAB — POCT INR: INR: 1.4 — AB (ref 2.0–3.0)

## 2021-12-09 NOTE — Patient Instructions (Signed)
Description   - take 2.5 tablets tonight, then - START NEW DOSAGE of warfarin 2 tablets every day EXCEPT 1 tablet on Fridays.  - Recheck INR in 1 week.  Stay consistent with greens weekly.  Coumadin Clinic [phone]  Amio 200mg  daily.

## 2021-12-10 ENCOUNTER — Ambulatory Visit: Payer: 59 | Admitting: Cardiology

## 2021-12-15 ENCOUNTER — Other Ambulatory Visit: Payer: Self-pay | Admitting: Thoracic Surgery (Cardiothoracic Vascular Surgery)

## 2021-12-15 DIAGNOSIS — Z952 Presence of prosthetic heart valve: Secondary | ICD-10-CM

## 2021-12-16 ENCOUNTER — Ambulatory Visit (INDEPENDENT_AMBULATORY_CARE_PROVIDER_SITE_OTHER): Payer: Self-pay | Admitting: Surgical

## 2021-12-16 ENCOUNTER — Other Ambulatory Visit: Payer: Self-pay

## 2021-12-16 ENCOUNTER — Ambulatory Visit (INDEPENDENT_AMBULATORY_CARE_PROVIDER_SITE_OTHER): Payer: 59

## 2021-12-16 ENCOUNTER — Ambulatory Visit
Admission: RE | Admit: 2021-12-16 | Discharge: 2021-12-16 | Disposition: A | Discharge status: Home / Self Care | Payer: 59 | Source: Ambulatory Visit | Attending: Thoracic Surgery (Cardiothoracic Vascular Surgery) | Admitting: Thoracic Surgery (Cardiothoracic Vascular Surgery)

## 2021-12-16 VITALS — BP 124/77 | HR 94 | Resp 20 | Ht 65.0 in | Wt 222.0 lb

## 2021-12-16 DIAGNOSIS — Z5181 Encounter for therapeutic drug level monitoring: Secondary | ICD-10-CM | POA: Diagnosis not present

## 2021-12-16 DIAGNOSIS — Z952 Presence of prosthetic heart valve: Secondary | ICD-10-CM

## 2021-12-16 DIAGNOSIS — Z7901 Long term (current) use of anticoagulants: Secondary | ICD-10-CM | POA: Diagnosis not present

## 2021-12-16 DIAGNOSIS — I4891 Unspecified atrial fibrillation: Secondary | ICD-10-CM

## 2021-12-16 LAB — POCT INR: INR: 1.3 — AB (ref 2.0–3.0)

## 2021-12-16 NOTE — Patient Instructions (Signed)
Discussed activity progression including driving. °

## 2021-12-16 NOTE — Progress Notes (Signed)
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  Left atrial box lesion set, right atrial SVC, IVC and appendage ablation 45mm Atriclip placement        Surgeon and Role:      * Lightfoot, Eliezer Lofts, MD - Primary    Webb Laws, PA-C  History of Present Illness:    The patient is a 56 year old male status post the above described procedure seen in the office on today's date and routine postsurgical follow-up.  Overall he describes feeling better and stronger over time.  He does have some mild dyspnea on exertion, but also he feels as though this is continued improve with time.  He denies fevers, chills or other significant constitutional symptoms.  He had no significant difficulty with his incisions.  He is not having palpitations.  He has some mild lower extremity edema.  He is not having chest pain and surgical pain is fairly minor at this point.  He is being monitored in the Coumadin clinic.  He is scheduled to see cardiology soon including to get another echocardiogram.      Past Medical History:  Diagnosis Date   Atrial fibrillation with rapid ventricular response (HCC) 08/12/2020   Bicuspid aortic valve    CHF (congestive heart failure) (HCC)    Chronic hepatitis C virus infection (HCC) 08/03/2013   COPD (chronic obstructive pulmonary disease) (HCC)    Coronary artery disease    Diabetes  mellitus type 2, insulin dependent (HCC)    Dysrhythmia    A Fib   Essential hypertension 07/03/2015   Last Assessment & Plan:  Blood pressure is borderline high.  Advised to get better sleep, walk on a regular basis, decrease weight down to about 210 and maintain, and eliminate tobacco.   HFrEF (heart failure with reduced ejection fraction) (HCC) 08/22/2020   Long-term insulin use (HCC) 10/12/2016   Obesity, unspecified 10/12/2016   Patient advised to lose down to about 210 pounds and maintained.  Regular walking program recommended.  He knows how to lose weight.   Sleep apnea      Social History   Tobacco Use  Smoking Status Former  Smokeless Tobacco Former   Types: Snuff  Tobacco Comments   Former snuff 08/11/2021    Social History   Substance and Sexual Activity  Alcohol Use Not Currently     No Known Allergies  Current Outpatient Medications  Medication Sig Dispense Refill   albuterol (PROVENTIL) (2.5 MG/3ML) 0.083% nebulizer solution Take 3 mLs (2.5 mg total) by nebulization every 6 (six) hours as needed for wheezing.     ALPRAZolam (XANAX) 0.5  MG tablet Take 1 tablet (0.5 mg total) by mouth 3 (three) times daily as needed for anxiety. 90 tablet 3   amiodarone (PACERONE) 400 MG tablet Take 1 tablet (400 mg total) by mouth daily. 14 tablet 0   aspirin EC 81 MG EC tablet Take 1 tablet (81 mg total) by mouth daily. Swallow whole. 30 tablet 11   furosemide (LASIX) 40 MG tablet Take 1 tablet (40 mg total) by mouth daily. 14 tablet 0   glucose blood (CONTOUR NEXT TEST) test strip Use as instructed 100 each 12   losartan (COZAAR) 25 MG tablet Take 1 tablet (25 mg total) by mouth daily. 30 tablet 1   metFORMIN (GLUCOPHAGE) 500 MG tablet Take 1 tablet (500 mg total) by mouth 2 (two) times daily with a meal. TAKE 1 TABLET(500 MG) BY MOUTH TWICE DAILY WITH A MEAL     metoprolol tartrate (LOPRESSOR) 25 MG tablet Take 1 tablet (25 mg total) by mouth 2 (two) times daily. 60 tablet 1    potassium chloride SA (KLOR-CON M) 20 MEQ tablet Take 2 tablets (40 mEq total) by mouth daily. 20 tablet 14   rosuvastatin (CRESTOR) 40 MG tablet Take 1 tablet (40 mg total) by mouth daily. 90 tablet 3   TRESIBA FLEXTOUCH 100 UNIT/ML FlexTouch Pen Inject 20 Units into the skin daily.     umeclidinium-vilanterol (ANORO ELLIPTA) 62.5-25 MCG/ACT AEPB Inhale 1 puff into the lungs daily at 6 (six) AM. 1 each 6   VICTOZA 18 MG/3ML SOPN ADMINISTER 1.2 MG UNDER THE SKIN DAILY 12 mL 3   warfarin (COUMADIN) 2 MG tablet Take 1 tablet (2 mg total) by mouth daily. As directed by the coumadin clinic based on blood test(INR) 50 tablet 1   No current facility-administered medications for this visit.       Physical Exam: Ht 5\' 5"  (1.651 m)    BMI 39.11 kg/m   General appearance: alert, cooperative, and no distress Heart: regular rate and rhythm and no murmur Lungs: clear to auscultation bilaterally Abdomen: Obese, benign exam Extremities: Trace lower extremity edema Wound: Incision well-healed without evidence of infection   Diagnostic Studies & Laboratory data:     Recent Radiology Findings:   DG Chest 2 View  Result Date: 12/16/2021 CLINICAL DATA:  Post AVR low in November 2022, shortness of breath, former smoker EXAM: CHEST - 2 VIEW COMPARISON:  11/04/2021 FINDINGS: Normal heart size post median sternotomy, AVR and Maze procedure. Mediastinal contours and pulmonary vascularity normal. BILATERAL nipple shadows noted on 10/27/2021 exam. Lungs clear. No infiltrate, pleural effusion, or pneumothorax. Osseous structures unremarkable. IMPRESSION: No acute abnormalities. Electronically Signed   By: Lavonia Dana M.D.   On: 12/16/2021 14:01      Recent Lab Findings: Lab Results  Component Value Date   WBC 7.1 11/03/2021   HGB 10.2 (L) 11/03/2021   HCT 32.1 (L) 11/03/2021   PLT 150 11/03/2021   GLUCOSE 116 (H) 11/03/2021   CHOL 184 08/26/2021   TRIG 186 (H) 08/26/2021   HDL 42 08/26/2021    LDLCALC 109 (H) 08/26/2021   ALT 49 (H) 10/27/2021   AST 41 10/27/2021   NA 137 11/03/2021   K 4.1 11/03/2021   CL 100 11/03/2021   CREATININE 0.86 11/03/2021   BUN 27 (H) 11/03/2021   CO2 32 11/03/2021   INR 1.3 (A) 12/16/2021   HGBA1C 8.0 (H) 10/27/2021      Assessment / Plan: Patient is doing well post surgery.  There are  no specific current surgical issues.  Reviewed his chest x-ray and there are no specific findings that would necessitate further treatment.  We discussed activity progression including driving.  He will continue to be followed in the Coumadin clinic as well as be followed by cardiology and primary care.  I made no changes to his current medication regimen.  He does note that he is only taking Lasix 20 mg daily which I think is an appropriate dose at this time.  We will see him again on a as needed basis for any surgical related issues or at request.      Medication Changes: No orders of the defined types were placed in this encounter.     John Giovanni, PA-C  12/16/2021 2:09 PM

## 2021-12-16 NOTE — Patient Instructions (Addendum)
Description   - take 3 tablets tonight, then - START NEW DOSAGE of warfarin 2 tablets every day EXCEPT 2.5 tablets on Mondays, Wednesdays, and Fridays  - Recheck INR in 1 week.  Stay consistent with greens weekly.  Coumadin Clinic [phone]  Amio 200mg  daily.

## 2021-12-17 ENCOUNTER — Telehealth: Payer: Self-pay | Admitting: Cardiology

## 2021-12-17 ENCOUNTER — Other Ambulatory Visit: Payer: 59

## 2021-12-17 NOTE — Telephone Encounter (Signed)
°  Pt c/o medication issue:  1. Name of Medication: metoprolol  2. How are you currently taking this medication (dosage and times per day)?   3. Are you having a reaction (difficulty breathing--STAT)?   4. What is your medication issue? Pt said, he's home from hospital and confused what Metoprolol he needs to be taking, he was taking succinate before his procedure and now when he picked up his prescription he has tartrate. He said he wasn't sure and need Dr. Joya Gaskins he also said his BP today is at 157/95

## 2021-12-17 NOTE — Telephone Encounter (Signed)
Spoke to the patient just now and let him know that he should continue on the lopressor at this time. I advised him to take his blood pressure 1 hour after he takes his medications. He states that he will do so and will keep a log of these. He will let us know if it remains elevated. He verbalizes understanding and thanks me for calling him back.

## 2021-12-22 ENCOUNTER — Other Ambulatory Visit: Payer: Self-pay

## 2021-12-22 ENCOUNTER — Telehealth: Payer: Self-pay

## 2021-12-22 NOTE — Telephone Encounter (Signed)
error 

## 2021-12-22 NOTE — Telephone Encounter (Signed)
Patient asked for a refill for Zolpidem. He mentioned that it was prescribed in the past. Please advice.

## 2021-12-23 ENCOUNTER — Other Ambulatory Visit: Payer: Self-pay

## 2021-12-23 ENCOUNTER — Other Ambulatory Visit: Payer: Self-pay | Admitting: Legal Medicine

## 2021-12-23 ENCOUNTER — Ambulatory Visit (INDEPENDENT_AMBULATORY_CARE_PROVIDER_SITE_OTHER): Payer: 59

## 2021-12-23 DIAGNOSIS — Z7901 Long term (current) use of anticoagulants: Secondary | ICD-10-CM | POA: Diagnosis not present

## 2021-12-23 DIAGNOSIS — Z5181 Encounter for therapeutic drug level monitoring: Secondary | ICD-10-CM | POA: Diagnosis not present

## 2021-12-23 DIAGNOSIS — Z952 Presence of prosthetic heart valve: Secondary | ICD-10-CM | POA: Diagnosis not present

## 2021-12-23 DIAGNOSIS — I4891 Unspecified atrial fibrillation: Secondary | ICD-10-CM

## 2021-12-23 LAB — POCT INR: INR: 1.9 — AB (ref 2.0–3.0)

## 2021-12-23 MED ORDER — ZOLPIDEM TARTRATE 10 MG PO TABS: 10.0000 mg | ORAL_TABLET | Freq: Every evening | ORAL | 2 refills | Status: DC | PRN

## 2021-12-23 NOTE — Patient Instructions (Signed)
Description   - START NEW DOSAGE of warfarin 2.5 tablets every day - Recheck INR in 1 week.  Stay consistent with greens weekly.  Coumadin Clinic [phone]  Amio 200mg  daily.

## 2021-12-25 ENCOUNTER — Ambulatory Visit (INDEPENDENT_AMBULATORY_CARE_PROVIDER_SITE_OTHER): Payer: 59 | Admitting: Legal Medicine

## 2021-12-25 ENCOUNTER — Ambulatory Visit: Payer: 59 | Admitting: Legal Medicine

## 2021-12-25 ENCOUNTER — Other Ambulatory Visit: Payer: Self-pay

## 2021-12-25 ENCOUNTER — Encounter: Payer: Self-pay | Admitting: Legal Medicine

## 2021-12-25 VITALS — BP 102/70 | HR 95 | Temp 97.7°F | Resp 16 | Ht 65.0 in | Wt 219.0 lb

## 2021-12-25 DIAGNOSIS — I10 Essential (primary) hypertension: Secondary | ICD-10-CM | POA: Diagnosis not present

## 2021-12-25 DIAGNOSIS — I4819 Other persistent atrial fibrillation: Secondary | ICD-10-CM

## 2021-12-25 DIAGNOSIS — E1142 Type 2 diabetes mellitus with diabetic polyneuropathy: Secondary | ICD-10-CM

## 2021-12-25 DIAGNOSIS — K7460 Unspecified cirrhosis of liver: Secondary | ICD-10-CM

## 2021-12-25 DIAGNOSIS — I5020 Unspecified systolic (congestive) heart failure: Secondary | ICD-10-CM | POA: Diagnosis not present

## 2021-12-25 DIAGNOSIS — D6869 Other thrombophilia: Secondary | ICD-10-CM

## 2021-12-25 DIAGNOSIS — Z09 Encounter for follow-up examination after completed treatment for conditions other than malignant neoplasm: Secondary | ICD-10-CM

## 2021-12-25 DIAGNOSIS — Z952 Presence of prosthetic heart valve: Secondary | ICD-10-CM

## 2021-12-25 DIAGNOSIS — E785 Hyperlipidemia, unspecified: Secondary | ICD-10-CM

## 2021-12-25 DIAGNOSIS — J449 Chronic obstructive pulmonary disease, unspecified: Secondary | ICD-10-CM

## 2021-12-25 DIAGNOSIS — R801 Persistent proteinuria, unspecified: Secondary | ICD-10-CM

## 2021-12-25 HISTORY — DX: Encounter for follow-up examination after completed treatment for conditions other than malignant neoplasm: Z09

## 2021-12-25 HISTORY — DX: Unspecified cirrhosis of liver: K74.60

## 2021-12-25 MED ORDER — LOSARTAN POTASSIUM 50 MG PO TABS: 50.0000 mg | ORAL_TABLET | Freq: Every day | ORAL | 2 refills | Status: DC

## 2021-12-25 MED ORDER — TRESIBA FLEXTOUCH 100 UNIT/ML ~~LOC~~ SOPN: 62.0000 [IU] | PEN_INJECTOR | Freq: Every day | SUBCUTANEOUS | 2 refills | Status: DC

## 2021-12-25 MED ORDER — VICTOZA 18 MG/3ML ~~LOC~~ SOPN: PEN_INJECTOR | SUBCUTANEOUS | 3 refills | Status: DC

## 2021-12-25 MED ORDER — METOPROLOL TARTRATE 25 MG PO TABS: 25.0000 mg | ORAL_TABLET | Freq: Two times a day (BID) | ORAL | 2 refills | Status: DC

## 2021-12-25 MED ORDER — FUROSEMIDE 40 MG PO TABS: 40.0000 mg | ORAL_TABLET | Freq: Every day | ORAL | 2 refills | Status: DC

## 2021-12-25 NOTE — Progress Notes (Signed)
Subjective:  Patient ID: Marcus Hicks, male    DOB: 1965/12/13  Age: 56 y.o. MRN: [id number]  Chief Complaint  Patient presents with   Diabetes   Hypertension   Hyperlipidemia    HPI Diabetes: Patient is Metformin 500 mg TWICE DAILY,  victoza 1.2 daily, Tresiba 62 units daily. He does not check his blood sugar at home. He mentioned to check his feet daily. He said sharp lightly pain on both heels, dry skin. Last A1C was 7.2 %.  Hypertension: He takes metoprolol 25 mg TWICE DAILY, aspirin 81 mg, losartan 25 mg  daily, furosemide 40 mg daily.  Afib: Amiodarone 400 mg daily, coumadin 2 mg daily.  Hyperlipidemia: He takes rosuvastatin 40 mg daily. Patient presents with hyperlipidemia.  Compliance with treatment has been good; patient takes medicines as directed, maintains low cholesterol diet, follows up as directed, and maintains exercise regimen.  Patient is using crestor without problems.   Patient was recently discharged for aortic valve replacement and is doing well. Current Outpatient Medications on File Prior to Visit  Medication Sig Dispense Refill   albuterol (PROVENTIL) (2.5 MG/3ML) 0.083% nebulizer solution Take 3 mLs (2.5 mg total) by nebulization every 6 (six) hours as needed for wheezing.     ALPRAZolam (XANAX) 0.5 MG tablet Take 1 tablet (0.5 mg total) by mouth 3 (three) times daily as needed for anxiety. 90 tablet 3   amiodarone (PACERONE) 400 MG tablet Take 1 tablet (400 mg total) by mouth daily. 14 tablet 0   aspirin EC 81 MG EC tablet Take 1 tablet (81 mg total) by mouth daily. Swallow whole. 30 tablet 11   glucose blood (CONTOUR NEXT TEST) test strip Use as instructed 100 each 12   metFORMIN (GLUCOPHAGE) 500 MG tablet Take 1 tablet (500 mg total) by mouth 2 (two) times daily with a meal. TAKE 1 TABLET(500 MG) BY MOUTH TWICE DAILY WITH A MEAL     potassium chloride SA (KLOR-CON M) 20 MEQ tablet Take 2 tablets (40 mEq total) by mouth daily. 20 tablet 14    rosuvastatin (CRESTOR) 40 MG tablet Take 1 tablet (40 mg total) by mouth daily. 90 tablet 3   umeclidinium-vilanterol (ANORO ELLIPTA) 62.5-25 MCG/ACT AEPB Inhale 1 puff into the lungs daily at 6 (six) AM. 1 each 6   warfarin (COUMADIN) 2 MG tablet Take 1 tablet (2 mg total) by mouth daily. As directed by the coumadin clinic based on blood test(INR) 50 tablet 1   zolpidem (AMBIEN) 10 MG tablet Take 1 tablet (10 mg total) by mouth at bedtime as needed. 30 tablet 2   No current facility-administered medications on file prior to visit.   Past Medical History:  Diagnosis Date   Atrial fibrillation with rapid ventricular response (HCC) 08/12/2020   Bicuspid aortic valve    CHF (congestive heart failure) (HCC)    Chronic hepatitis C virus infection (HCC) 08/03/2013   COPD (chronic obstructive pulmonary disease) (HCC)    Coronary artery disease    Diabetes mellitus type 2, insulin dependent (HCC)    Dysrhythmia    A Fib   Essential hypertension 07/03/2015   Last Assessment & Plan:  Blood pressure is borderline high.  Advised to get better sleep, walk on a regular basis, decrease weight down to about 210 and maintain, and eliminate tobacco.   HFrEF (heart failure with reduced ejection fraction) (HCC) 08/22/2020   Long-term insulin use (HCC) 10/12/2016   Obesity, unspecified 10/12/2016   Patient advised to lose  down to about 210 pounds and maintained.  Regular walking program recommended.  He knows how to lose weight.   Sleep apnea    Past Surgical History:  Procedure Laterality Date   AORTIC ROOT ENLARGEMENT  10/28/2021   Procedure: AORTIC ROOT ENLARGEMENT WITH HEMASHIELD PLATINUM X 30CM;  Surgeon: Corliss Skains, MD;  Location: [facility] OR;  Service: Open Heart Surgery;;   AORTIC VALVE REPLACEMENT N/A 10/28/2021   Procedure: AORTIC VALVE REPLACEMENT (AVR) WITH INSPIRIS RESILIA AORTIC VALVE SIZE ;  Surgeon: Corliss Skains, MD;  Location: [facility] OR;  Service: Open Heart Surgery;   Laterality: N/A;   CARDIAC CATHETERIZATION     CARDIOVERSION N/A 09/24/2020   Procedure: CARDIOVERSION;  Surgeon: [name], MD;  Location: [facility] ENDOSCOPY;  Service: Cardiovascular;  Laterality: N/A;   CARDIOVERSION N/A 08/13/2021   Procedure: CARDIOVERSION;  Surgeon: [name], MD;  Location: [facility] ENDOSCOPY;  Service: Cardiovascular;  Laterality: N/A;   CCTA  08/2019   CLIPPING OF ATRIAL APPENDAGE N/A 10/28/2021   Procedure: CLIPPING OF ATRIAL APPENDAGE WITH ATRICLIP ACHV45;  Surgeon: Corliss Skains, MD;  Location: [facility] OR;  Service: Open Heart Surgery;  Laterality: N/A;   MAZE N/A 10/28/2021   Procedure: MAZE;  Surgeon: Corliss Skains, MD;  Location: [facility] OR;  Service: Open Heart Surgery;  Laterality: N/A;   RIGHT/LEFT HEART CATH AND CORONARY ANGIOGRAPHY N/A 09/26/2021   Procedure: RIGHT/LEFT HEART CATH AND CORONARY ANGIOGRAPHY;  Surgeon: Swazil[place], Peter M, MD;  Location: [facility] INVASIVE CV LAB;  Service: Cardiovascular;  Laterality: N/A;   TEE WITHOUT CARDIOVERSION  09/24/2020   Procedure: TRANSESOPHAGEAL ECHOCARDIOGRAM (TEE);  Surgeon: [name], MD;  Location: [facility] ENDOSCOPY;  Service: Cardiovascular;;   TEE WITHOUT CARDIOVERSION N/A 10/28/2021   Procedure: TRANSESOPHAGEAL ECHOCARDIOGRAM (TEE);  Surgeon: Corliss Skains, MD;  Location: [facility] OR;  Service: Open Heart Surgery;  Laterality: N/A;    Family History  Problem Relation Age of Onset   Cancer Maternal Grandfather    Liver disease Sister    Alzheimer's disease Other    Memory loss Other    Hypertension Other    Social History   Socioeconomic History   Marital status: Widowed    Spouse name: Not on file   Number of children: Not on file   Years of education: Not on file   Highest education level: Not on file  Occupational History   Not on file  Tobacco Use   Smoking status: Former   Smokeless tobacco: Former    Types: Snuff   Tobacco comments:    Former snuff 08/11/2021  Vaping  Use   Vaping Use: Never used  Substance and Sexual Activity   Alcohol use: Not Currently   Drug use: Never   Sexual activity: Never  Other Topics Concern   Not on file  Social History Narrative   Not on file   Social Determinants of Health   Financial Resource Strain: Not on file  Food Insecurity: Not on file  Transportation Needs: Not on file  Physical Activity: Not on file  Stress: Not on file  Social Connections: Not on file    Review of Systems  Constitutional:  Negative for chills, fatigue, fever and unexpected weight change.  HENT:  Negative for congestion, ear pain, sinus pain and sore throat.   Respiratory:  Positive for shortness of breath.   Cardiovascular:  Positive for chest pain (when he moves). Negative for palpitations.  Gastrointestinal:  Negative for abdominal pain, blood  in stool, constipation, diarrhea, nausea and vomiting.  Endocrine: Negative for polydipsia.  Genitourinary:  Negative for dysuria.  Musculoskeletal:  Positive for arthralgias. Negative for back pain.       Both heel pain  Skin:  Negative for rash.  Neurological:  Negative for headaches.    Objective:  BP 102/70    Pulse 95    Temp 97.7 F (36.5 C)    Resp 16    Ht 5\' 5"  (1.651 m)    Wt 219 lb (99.3 kg)    SpO2 95%    BMI 36.44 kg/m   BP/Weight 12/25/2021 12/16/2021 12/02/2021  Systolic BP 102 124 109  Diastolic BP 70 77 78  Wt. (Lbs) 219 222 235  BMI 36.44 36.94 39.11    Physical Exam Vitals reviewed.  Constitutional:      Appearance: Normal appearance. He is obese.  HENT:     Head: Normocephalic.     Right Ear: Tympanic membrane, ear canal and external ear normal.     Left Ear: Tympanic membrane, ear canal and external ear normal.     Nose: Nose normal.     Mouth/Throat:     Mouth: Mucous membranes are moist.     Pharynx: Oropharynx is clear. No posterior oropharyngeal erythema.  Eyes:     Extraocular Movements: Extraocular movements intact.     Conjunctiva/sclera:  Conjunctivae normal.     Pupils: Pupils are equal, round, and reactive to light.  Neck:     Vascular: No carotid bruit.  Cardiovascular:     Rate and Rhythm: Normal rate and regular rhythm.     Pulses: Normal pulses.     Heart sounds: Normal heart sounds. No murmur heard. Pulmonary:     Effort: Pulmonary effort is normal.     Breath sounds: Normal breath sounds.  Abdominal:     General: Bowel sounds are normal.     Palpations: Abdomen is soft. There is no mass.  Musculoskeletal:        General: Normal range of motion.     Cervical back: Normal range of motion. No tenderness.     Right lower leg: No edema.     Left lower leg: No edema.  Skin:    General: Skin is warm.     Capillary Refill: Capillary refill takes 2 to 3 seconds.  Neurological:     General: No focal deficit present.     Mental Status: He is alert and oriented to person, place, and time. Mental status is at baseline.     Gait: Gait normal.     Deep Tendon Reflexes: Reflexes normal.  Psychiatric:        Mood and Affect: Mood normal.        Behavior: Behavior normal.        Thought Content: Thought content normal.    Diabetic Foot Exam - Simple   Simple Foot Form Diabetic Foot exam was performed with the following findings: Yes 12/25/2021  2:56 PM  Visual Inspection No deformities, no ulcerations, no other skin breakdown bilaterally: Yes Sensation Testing See comments: Yes Pulse Check See comments: Yes Comments Decreased sensation on both feet. Slow capillary filling.      Lab Results  Component Value Date   WBC 7.1 11/03/2021   HGB 10.2 (L) 11/03/2021   HCT 32.1 (L) 11/03/2021   PLT 150 11/03/2021   GLUCOSE 116 (H) 11/03/2021   CHOL 184 08/26/2021   TRIG 186 (H) 08/26/2021   HDL 42 08/26/2021  LDLCALC 109 (H) 08/26/2021   ALT 49 (H) 10/27/2021   AST 41 10/27/2021   NA 137 11/03/2021   K 4.1 11/03/2021   CL 100 11/03/2021   CREATININE 0.86 11/03/2021   BUN 27 (H) 11/03/2021   CO2 32  11/03/2021   INR 1.9 (A) 12/23/2021   HGBA1C 8.0 (H) 10/27/2021   MICROALBUR 30 04/09/2021      Assessment & Plan:   Problem List Items Addressed This Visit       Cardiovascular and Mediastinum   Essential hypertension   Relevant Medications   furosemide (LASIX) 40 MG tablet   metoprolol tartrate (LOPRESSOR) 25 MG tablet   losartan (COZAAR) 50 MG tablet   Other Relevant Orders   Comprehensive metabolic panel   CBC with Differential/Platelet An individual hypertension care plan was established and reinforced today.  The patient's status was assessed using clinical findings on exam and labs or diagnostic tests. The patient's success at meeting treatment goals on disease specific evidence-based guidelines and found to be well controlled. SELF MANAGEMENT: The patient and I together assessed ways to personally work towards obtaining the recommended goals. RECOMMENDATIONS: avoid decongestants found in common cold remedies, decrease consumption of alcohol, perform routine monitoring of BP with home BP cuff, exercise, reduction of dietary salt, take medicines as prescribed, try not to miss doses and quit smoking.  Regular exercise and maintaining a healthy weight is needed.  Stress reduction may help. A CLINICAL SUMMARY including written plan identify barriers to care unique to individual due to social or financial issues.  We attempt to mutually creat solutions for individual and family understanding.     HFrEF (heart failure with reduced ejection fraction) (HCC)   Relevant Medications   furosemide (LASIX) 40 MG tablet   metoprolol tartrate (LOPRESSOR) 25 MG tablet   losartan (COZAAR) 50 MG tablet An individualized care plan was established and reinforced.  The patient's disease status was assessed using clinical finding son exam today, labs, and/or other diagnostic testing such as x-rays, to determine the patient's success in meeting treatmentgoalsbased on disease-based guidelines and found  to beimproving. But not at goal yet. Medications prescriptions no changed Laboratory tests ordered to be performed today include routine lab. RECOMMENDATIONS: given include see cardiology.  Call physician is patient gains 3 lbs in one day or 5 lbs for one week.  Call for progressive PND, orthopnea or increased pedal edema.     Persistent atrial fibrillation (HCC)   Relevant Medications   furosemide (LASIX) 40 MG tablet   metoprolol tartrate (LOPRESSOR) 25 MG tablet   losartan (COZAAR) 50 MG tablet Patient has a diagnosis of permanent atrial fibrillation.   Patient is on wardRIN and has controlled ventricular response.  Patient is CV stable.      Respiratory   COPD (chronic obstructive pulmonary disease) (HCC) An individualize plan was formulated for care of COPD.  Treatment is evidence based.  She will continue on inhalers, avoid smoking and smoke.  Regular exercise with help with dyspnea. Routine follow ups and medication compliance is needed.      Digestive   Cirrhosis of liver without ascites, unspecified hepatic cirrhosis type (HCC) [facility]     Endocrine   Type 2 diabetes mellitus with diabetic polyneuropathy (HCC)   Relevant Medications   losartan (COZAAR) 50 MG tablet   liraglutide (VICTOZA) 18 MG/3ML SOPN   TRESIBA FLEXTOUCH 100 UNIT/ML FlexTouch Pen   Other Relevant Orders   Hemoglobin A1c  Microalbumin / creatinine urine ratio An individual care plan for diabetes was established and reinforced today.  The patient's status was assessed using clinical findings on exam, labs and diagnostic testing. Patient success at meeting goals based on disease specific evidence-based guidelines and found to be fair controlled. Medications were assessed and patient's understanding of the medical issues , including barriers were assessed. Recommend adherence to a diabetic diet, a graduated exercise program, HgbA1c level is checked quarterly, and urine  microalbumin performed yearly .  Annual mono-filament sensation testing performed. Lower blood pressure and control hyperlipidemia is important. Get annual eye exams and annual flu shots and smoking cessation discussed.  Self management goals were discussed.      Hematopoietic and Hemostatic   Acquired thrombophilia (HCC) Patient is on warfarin for atrial fibrillation     Other   Dyslipidemia   Relevant Orders   Lipid panel AN INDIVIDUAL CARE PLAN for hyperlipidemia/ cholesterol was established and reinforced today.  The patient's status was assessed using clinical findings on exam, lab and other diagnostic tests. The patient's disease status was assessed based on evidence-based guidelines and found to be fair controlled. MEDICATIONS were reviewed. SELF MANAGEMENT GOALS have been discussed and patient's success at attaining the goal of low cholesterol was assessed. RECOMMENDATION given include regular exercise 3 days a week and low cholesterol/low fat diet. CLINICAL SUMMARY including written plan to identify barriers unique to the patient due to social or economic  reasons was discussed.     Morbid obesity (HCC)   Relevant Medications   liraglutide (VICTOZA) 18 MG/3ML SOPN   TRESIBA FLEXTOUCH 100 UNIT/ML FlexTouch Pen An individualize plan was formulated for obesity using patient history and physical exam to encourage weight loss.  An evidence based program was formulated.  Patient is to cut portion size with meals and to plan physical exercise 3 days a week at least 20 minutes.  Weight watchers and other programs are helpful.  Planned amount of weight loss 10 lbs. Patient's BMI 25 and he has hypertension and hypercholesterolemia so meets criteria for morbid obesity   Encounter for follow-up for aortic valve replacement - Primary Aortic valve is doing well after  surgery   Other Visit Diagnoses     Type 2 diabetes mellitus with polyneuropathy (HCC)       Relevant Medications   losartan  (COZAAR) 50 MG tablet   liraglutide (VICTOZA) 18 MG/3ML SOPN   TRESIBA FLEXTOUCH 100 UNIT/ML FlexTouch Pen An individual care plan for diabetes was established and reinforced today.  The patient's status was assessed using clinical findings on exam, labs and diagnostic testing. Patient success at meeting goals based on disease specific evidence-based guidelines and found to be fair controlled. Medications were assessed and patient's understanding of the medical issues , including barriers were assessed. Recommend adherence to a diabetic diet, a graduated exercise program, HgbA1c level is checked quarterly, and urine microalbumin performed yearly .  Annual mono-filament sensation testing performed. Lower blood pressure and control hyperlipidemia is important. Get annual eye exams and annual flu shots and smoking cessation discussed.  Self management goals were discussed.      .  Meds ordered this encounter  Medications   furosemide (LASIX) 40 MG tablet    Sig: Take 1 tablet (40 mg total) by mouth daily.    Dispense:  90 tablet    Refill:  2   metoprolol tartrate (LOPRESSOR) 25 MG tablet    Sig: Take 1 tablet (25 mg total) by mouth 2 (  two) times daily.    Dispense:  180 tablet    Refill:  2   losartan (COZAAR) 50 MG tablet    Sig: Take 1 tablet (50 mg total) by mouth daily.    Dispense:  90 tablet    Refill:  2   liraglutide (VICTOZA) 18 MG/3ML SOPN    Sig: ADMINISTER 1.2 MG UNDER THE SKIN DAILY    Dispense:  12 mL    Refill:  3   TRESIBA FLEXTOUCH 100 UNIT/ML FlexTouch Pen    Sig: Inject 62 Units into the skin daily.    Dispense:  15 mL    Refill:  2    ZERO refills remain on this prescription. Your patient is requesting advance approval of refills for this medication to PREVENT ANY MISSED DOSES    Orders Placed This Encounter  Procedures   Comprehensive metabolic panel   Hemoglobin A1c   Lipid panel   Microalbumin / creatinine urine ratio   CBC with Differential/Platelet    30 minute visit with review of hospital records  Follow-up: Return in about 3 months (around 03/25/2022).  An After Visit Summary was printed and given to the patient.  Brent Bulla, MD [facility] [phone]

## 2021-12-26 LAB — CBC WITH DIFFERENTIAL/PLATELET
Basophils Absolute: 0.1 10*3/uL (ref 0.0–0.2)
Basos: 1 %
EOS (ABSOLUTE): 0.2 10*3/uL (ref 0.0–0.4)
Eos: 3 %
Hematocrit: 45.1 % (ref 37.5–51.0)
Hemoglobin: 14.5 g/dL (ref 13.0–17.7)
Immature Grans (Abs): 0 10*3/uL (ref 0.0–0.1)
Immature Granulocytes: 0 %
Lymphocytes Absolute: 1.2 10*3/uL (ref 0.7–3.1)
Lymphs: 20 %
MCH: 27.8 pg (ref 26.6–33.0)
MCHC: 32.2 g/dL (ref 31.5–35.7)
MCV: 86 fL (ref 79–97)
Monocytes Absolute: 0.5 10*3/uL (ref 0.1–0.9)
Monocytes: 9 %
Neutrophils Absolute: 4.2 10*3/uL (ref 1.4–7.0)
Neutrophils: 67 %
Platelets: 229 10*3/uL (ref 150–450)
RBC: 5.22 x10E6/uL (ref 4.14–5.80)
RDW: 14.1 % (ref 11.6–15.4)
WBC: 6.3 10*3/uL (ref 3.4–10.8)

## 2021-12-26 LAB — LIPID PANEL
Chol/HDL Ratio: 2.8 ratio (ref 0.0–5.0)
Cholesterol, Total: 142 mg/dL (ref 100–199)
HDL: 50 mg/dL (ref >39)
LDL Chol Calc (NIH): 72 mg/dL (ref 0–99)
Triglycerides: 112 mg/dL (ref 0–149)
VLDL Cholesterol Cal: 20 mg/dL (ref 5–40)

## 2021-12-26 LAB — COMPREHENSIVE METABOLIC PANEL
ALT: 27 IU/L (ref 0–44)
AST: 27 IU/L (ref 0–40)
Albumin/Globulin Ratio: 1.9 (ref 1.2–2.2)
Albumin: 5.1 g/dL — ABNORMAL HIGH (ref 3.8–4.9)
Alkaline Phosphatase: 64 IU/L (ref 44–121)
BUN/Creatinine Ratio: 19 (ref 9–20)
BUN: 21 mg/dL (ref 6–24)
Bilirubin Total: 0.3 mg/dL (ref 0.0–1.2)
CO2: 28 mmol/L (ref 20–29)
Calcium: 9.8 mg/dL (ref 8.7–10.2)
Chloride: 99 mmol/L (ref 96–106)
Creatinine, Ser: 1.11 mg/dL (ref 0.76–1.27)
Globulin, Total: 2.7 g/dL (ref 1.5–4.5)
Glucose: 88 mg/dL (ref 70–99)
Potassium: 5.2 mmol/L (ref 3.5–5.2)
Sodium: 144 mmol/L (ref 134–144)
Total Protein: 7.8 g/dL (ref 6.0–8.5)
eGFR: 77 mL/min/{1.73_m2} (ref >59)

## 2021-12-26 LAB — MICROALBUMIN / CREATININE URINE RATIO
Creatinine, Urine: 85.8 mg/dL
Microalb/Creat Ratio: 219 mg/g creat — ABNORMAL HIGH (ref 0–29)
Microalbumin, Urine: 188.2 ug/mL

## 2021-12-26 LAB — HEMOGLOBIN A1C
Est. average glucose Bld gHb Est-mCnc: 143 mg/dL
Hgb A1c MFr Bld: 6.6 % — ABNORMAL HIGH (ref 4.8–5.6)

## 2021-12-26 LAB — CARDIOVASCULAR RISK ASSESSMENT

## 2021-12-28 NOTE — Progress Notes (Signed)
Kidney tests norma, liver tests normal, A1c 6.6 ok, Microalbuminuria high, needs 24 hour urine for protein, triglycerides high, LDL cholesterol 109 high, CBC normal,  lp

## 2021-12-30 ENCOUNTER — Other Ambulatory Visit: Payer: Self-pay

## 2021-12-30 ENCOUNTER — Ambulatory Visit (INDEPENDENT_AMBULATORY_CARE_PROVIDER_SITE_OTHER): Payer: 59

## 2021-12-30 DIAGNOSIS — I4891 Unspecified atrial fibrillation: Secondary | ICD-10-CM | POA: Diagnosis not present

## 2021-12-30 DIAGNOSIS — Z5181 Encounter for therapeutic drug level monitoring: Secondary | ICD-10-CM | POA: Diagnosis not present

## 2021-12-30 DIAGNOSIS — Z7901 Long term (current) use of anticoagulants: Secondary | ICD-10-CM

## 2021-12-30 LAB — POCT INR: INR: 2.9 (ref 2.0–3.0)

## 2021-12-30 NOTE — Patient Instructions (Signed)
Description   Continue of warfarin 2.5 tablets every day - Recheck INR in 1 week.  Stay consistent with greens weekly.  Cou[facility]in Clinic [phone]  Amio 200mg  daily.

## 2021-12-31 NOTE — Addendum Note (Signed)
Addended by: Reinaldo Meeker on: 12/31/2021 04:00 PM   Modules accepted: Orders

## 2022-01-02 ENCOUNTER — Other Ambulatory Visit: Payer: Self-pay | Admitting: Legal Medicine

## 2022-01-02 DIAGNOSIS — R801 Persistent proteinuria, unspecified: Secondary | ICD-10-CM

## 2022-01-03 LAB — PROTEIN, URINE, 24 HOUR
Protein, 24H Urine: 606 mg/24 hr — ABNORMAL HIGH (ref 30–150)
Protein, Ur: 50.5 mg/dL

## 2022-01-04 ENCOUNTER — Other Ambulatory Visit: Payer: Self-pay | Admitting: Legal Medicine

## 2022-01-04 DIAGNOSIS — E1142 Type 2 diabetes mellitus with diabetic polyneuropathy: Secondary | ICD-10-CM

## 2022-01-04 NOTE — Progress Notes (Signed)
Urine protein high 606 we will watch, no nephrotic syndrome lp

## 2022-01-06 ENCOUNTER — Other Ambulatory Visit: Payer: Self-pay

## 2022-01-06 ENCOUNTER — Ambulatory Visit (INDEPENDENT_AMBULATORY_CARE_PROVIDER_SITE_OTHER): Payer: 59

## 2022-01-06 DIAGNOSIS — Z7901 Long term (current) use of anticoagulants: Secondary | ICD-10-CM | POA: Diagnosis not present

## 2022-01-06 DIAGNOSIS — I4891 Unspecified atrial fibrillation: Secondary | ICD-10-CM | POA: Diagnosis not present

## 2022-01-06 DIAGNOSIS — Z5181 Encounter for therapeutic drug level monitoring: Secondary | ICD-10-CM

## 2022-01-06 LAB — POCT INR: INR: 3.1 — AB (ref 2.0–3.0)

## 2022-01-06 MED ORDER — WARFARIN SODIUM 2 MG PO TABS: 2.0000 mg | ORAL_TABLET | Freq: Every day | ORAL | 2 refills | Status: DC

## 2022-01-06 NOTE — Telephone Encounter (Deleted)
Prescription refill request received for warfarin Lov: 10/15/21 [facility])  Next INR check: 01/20/22 Warfarin tablet strength: 5mg 

## 2022-01-06 NOTE — Addendum Note (Signed)
Addended by: Memory Dance on: 01/06/2022 02:41 PM   Modules accepted: Orders

## 2022-01-06 NOTE — Telephone Encounter (Signed)
Prescription refill request received for warfarin Lov: 10/15/21 [facility])  Next INR check: 01/20/22 Warfarin tablet strength: 2mg   Appropriate dose and refill sent to requested pharmacy.

## 2022-01-06 NOTE — Patient Instructions (Signed)
Description   - Eat greens tonight and then Continue of warfarin 2.5 tablets every day - Recheck INR in 2 weeks.  - Stay consistent with greens weekly.  Coumadin Clinic [phone]  Amio 200mg  daily.

## 2022-01-08 ENCOUNTER — Telehealth: Payer: Self-pay

## 2022-01-08 NOTE — Telephone Encounter (Signed)
[facility] sent a fax for the approved of Victoza 18 mg/3 ml solution Pen-injector. Approved from 12/29/2021 to 12/29/2022.

## 2022-01-12 ENCOUNTER — Other Ambulatory Visit: Payer: Self-pay

## 2022-01-12 MED ORDER — WARFARIN SODIUM 5 MG PO TABS: 5.0000 mg | ORAL_TABLET | Freq: Every day | ORAL | 0 refills | Status: DC

## 2022-01-12 NOTE — Telephone Encounter (Signed)
Prescription refill request received for warfarin Lov: 10/15/21 [facility])  Next INR check: 01/20/22 Warfarin tablet strength: 5mg   Appropriate dose and refill sent to requested pharmacy. Called pt and made him aware that tablets are 5mg  tablets and should only take 1 tablet daily. Pt verbalized understanding.

## 2022-01-13 NOTE — Progress Notes (Unsigned)
Cardiology Office Note:    Date:  01/13/2022   ID:  Marcus Hicks, DOB 1966-11-02, MRN [id number]  PCP:  Abigail Miyamoto, MD  Cardiologist:  Norman Herrlich, MD    Referring MD: Abigail Miyamoto,*    ASSESSMENT:    No diagnosis found. PLAN:    In order of problems listed above:  ***   Next appointment: ***   Medication Adjustments/Labs and Tests Ordered: Current medicines are reviewed at length with the patient today.  Concerns regarding medicines are outlined above.  No orders of the defined types were placed in this encounter.  No orders of the defined types were placed in this encounter.   No chief complaint on file.   History of Present Illness:    Marcus Hicks is a 56 y.o. male with a hx of persistent atrial fibrillation rate controlled and anticoagulated heart failure reduced ejection fraction mild CAD hyperlipidemia hepatic cirrhosis thoracic aortic aneurysm and aortic stenosis last seen 10/15/2021.  He underwent surgical aortic valve replacement 25 mm Inspiris Resilia valve 10/28/2021 aortic root enlargement surgical maze procedure and 45 mm atrial clip 80 Compliance with diet, lifestyle and medications: *** Past Medical History:  Diagnosis Date   Atrial fibrillation with rapid ventricular response (HCC) 08/12/2020   Bicuspid aortic valve    CHF (congestive heart failure) (HCC)    Chronic hepatitis C virus infection (HCC) 08/03/2013   COPD (chronic obstructive pulmonary disease) (HCC)    Coronary artery disease    Diabetes mellitus type 2, insulin dependent (HCC)    Dysrhythmia    A Fib   Essential hypertension 07/03/2015   Last Assessment & Plan:  Blood pressure is borderline high.  Advised to get better sleep, walk on a regular basis, decrease weight down to about 210 and maintain, and eliminate tobacco.   HFrEF (heart failure with reduced ejection fraction) (HCC) 08/22/2020   Long-term insulin use (HCC) 10/12/2016   Obesity,  unspecified 10/12/2016   Patient advised to lose down to about 210 pounds and maintained.  Regular walking program recommended.  He knows how to lose weight.   Sleep apnea     Past Surgical History:  Procedure Laterality Date   AORTIC ROOT ENLARGEMENT  10/28/2021   Procedure: AORTIC ROOT ENLARGEMENT WITH HEMASHIELD PLATINUM X 30CM;  Surgeon: Corliss Skains, MD;  Location: [facility] OR;  Service: Open Heart Surgery;;   AORTIC VALVE REPLACEMENT N/A 10/28/2021   Procedure: AORTIC VALVE REPLACEMENT (AVR) WITH INSPIRIS RESILIA AORTIC VALVE SIZE ;  Surgeon: Corliss Skains, MD;  [facility];  Service: Open Heart Surgery;  Laterality: N/A;   CARDIAC CATHETERIZATION     CARDIOVERSION N/A 09/24/2020   Procedure: CARDIOVERSION;  Surgeon: [name], MD;  Location: [facility] ENDOSCOPY;  Service: Cardiovascular;  Laterality: N/A;   CARDIOVERSION N/A 08/13/2021   Procedure: CARDIOVERSION;  Surgeon: [name], MD;  Location: [facility] ENDOSCOPY;  Service: Cardiovascular;  Laterality: N/A;   CCTA  08/2019   CLIPPING OF ATRIAL APPENDAGE N/A 10/28/2021   Procedure: CLIPPING OF ATRIAL APPENDAGE WITH ATRICLIP ACHV45;  Surgeon: Corliss Skains, MD;  Location: [facility] OR;  Service: Open Heart Surgery;  Laterality: N/A;   MAZE N/A 10/28/2021   Procedure: MAZE;  Surgeon: Corliss Skains, MD;  Location: [facility] OR;  Service: Open Heart Surgery;  Laterality: N/A;   RIGHT/LEFT HEART CATH AND CORONARY ANGIOGRAPHY N/A 09/26/2021   Procedure: RIGHT/LEFT HEART CATH AND CORONARY ANGIOGRAPHY;  Surgeon: Swazil[place], Peter M, MD;  Location: [facility] INVASIVE CV LAB;  Service: Cardiovascular;  Laterality: N/A;   TEE WITHOUT CARDIOVERSION  09/24/2020   Procedure: TRANSESOPHAGEAL ECHOCARDIOGRAM (TEE);  Surgeon: [name], MD;  Location: [facility] ENDOSCOPY;  Service: Cardiovascular;;   TEE WITHOUT CARDIOVERSION N/A 10/28/2021   Procedure: TRANSESOPHAGEAL ECHOCARDIOGRAM (TEE);  Surgeon: Corliss Skains, MD;  Location: [facility] OR;  Service: Open Heart Surgery;  Laterality: N/A;    Current Medications: No outpatient medications have been marked as taking for the 01/14/22 encounter (Appointment) with Baldo Daub, MD.     Allergies:   Patient has no known allergies.   Social History   Socioeconomic History   Marital status: Widowed    Spouse name: Not on file   Number of children: Not on file   Years of education: Not on file   Highest education level: Not on file  Occupational History   Not on file  Tobacco Use   Smoking status: Former   Smokeless tobacco: Former    Types: Snuff   Tobacco comments:    Former snuff 08/11/2021  Vaping Use   Vaping Use: Never used  Substance and Sexual Activity   Alcohol use: Not Currently   Drug use: Never   Sexual activity: Never  Other Topics Concern   Not on file  Social History Narrative   Not on file   Social Determinants of Health   Financial Resource Strain: Not on file  Food Insecurity: Not on file  Transportation Needs: Not on file  Physical Activity: Not on file  Stress: Not on file  Social Connections: Not on file     Family History: The patient's ***family history includes Alzheimer's disease in an other family member; Cancer in his maternal grandfather; Hypertension in an other family member; Liver disease in his sister; Memory loss in an other family member. ROS:   Please see the history of present illness.    All other systems reviewed and are negative.  EKGs/Labs/Other Studies Reviewed:    The following studies were reviewed today:  EKG:  EKG ordered today and personally reviewed.  The ekg ordered today demonstrates ***  Recent Labs: 06/01/2021: B Natriuretic Peptide 85.2 08/26/2021: NT-Pro BNP 1,398 10/30/2021: Magnesium 2.8 12/25/2021: ALT 27; BUN 21; Creatinine, Ser 1.11; Hemoglobin 14.5; Platelets 229; Potassium 5.2; Sodium 144  Recent Lipid Panel    Component Value Date/Time   CHOL 142 12/25/2021 1506    TRIG 112 12/25/2021 1506   HDL 50 12/25/2021 1506   CHOLHDL 2.8 12/25/2021 1506   LDLCALC 72 12/25/2021 1506    Physical Exam:    VS:  There were no vitals taken for this visit.    Wt Readings from Last 3 Encounters:  12/25/21 219 lb (99.3 kg)  12/16/21 222 lb (100.7 kg)  12/02/21 235 lb (106.6 kg)     GEN: *** Well nourished, well developed in no acute distress HEENT: Normal NECK: No JVD; No carotid bruits LYMPHATICS: No lymphadenopathy CARDIAC: ***RRR, no murmurs, rubs, gallops RESPIRATORY:  Clear to auscultation without rales, wheezing or rhonchi  ABDOMEN: Soft, non-tender, non-distended MUSCULOSKELETAL:  No edema; No deformity  SKIN: Warm and dry NEUROLOGIC:  Alert and oriented x 3 PSYCHIATRIC:  Normal affect    Signed, Norman Herrlich, MD  01/13/2022 12:17 PM    [facility]

## 2022-01-14 ENCOUNTER — Ambulatory Visit (INDEPENDENT_AMBULATORY_CARE_PROVIDER_SITE_OTHER): Payer: 59 | Admitting: Cardiology

## 2022-01-14 ENCOUNTER — Other Ambulatory Visit: Payer: Self-pay

## 2022-01-14 ENCOUNTER — Ambulatory Visit (INDEPENDENT_AMBULATORY_CARE_PROVIDER_SITE_OTHER): Payer: 59

## 2022-01-14 DIAGNOSIS — I350 Nonrheumatic aortic (valve) stenosis: Secondary | ICD-10-CM

## 2022-01-14 DIAGNOSIS — Z952 Presence of prosthetic heart valve: Secondary | ICD-10-CM | POA: Diagnosis not present

## 2022-01-14 LAB — ECHOCARDIOGRAM COMPLETE
AR max vel: 1.21 cm2
AV Area VTI: 1.08 cm2
AV Area mean vel: 1.12 cm2
AV Mean grad: 8 mmHg
AV Peak grad: 15.2 mmHg
Ao pk vel: 1.95 m/s
Area-P 1/2: 5.2 cm2
Calc EF: 52.9 %
S' Lateral: 3.6 cm
Single Plane A2C EF: 55.7 %
Single Plane A4C EF: 44 %

## 2022-01-15 ENCOUNTER — Telehealth: Payer: Self-pay

## 2022-01-15 NOTE — Telephone Encounter (Signed)
Spoke with patient regarding results and recommendation.  Patient verbalizes understanding and is agreeable to plan of care. Advised patient to call back with any issues or concerns.  

## 2022-01-15 NOTE — Telephone Encounter (Signed)
-----   Message from Baldo Daub, MD sent at 01/15/2022 11:20 AM EST ----- Great result heart muscle function is normal as his bowel function.

## 2022-01-20 ENCOUNTER — Other Ambulatory Visit: Payer: Self-pay

## 2022-01-20 ENCOUNTER — Ambulatory Visit (INDEPENDENT_AMBULATORY_CARE_PROVIDER_SITE_OTHER): Payer: 59

## 2022-01-20 DIAGNOSIS — Z7901 Long term (current) use of anticoagulants: Secondary | ICD-10-CM

## 2022-01-20 DIAGNOSIS — Z5181 Encounter for therapeutic drug level monitoring: Secondary | ICD-10-CM | POA: Diagnosis not present

## 2022-01-20 DIAGNOSIS — I4891 Unspecified atrial fibrillation: Secondary | ICD-10-CM

## 2022-01-20 LAB — POCT INR: INR: 4.2 — AB (ref 2.0–3.0)

## 2022-01-20 NOTE — Patient Instructions (Signed)
Description   - Hold today's dose and then START taking 1 tablet daily except 0.5 tablet on Mondays and Fridays.  - Recheck INR in 2 weeks.  - Stay consistent with greens weekly.  [facility] [phone]  Amio 200mg  daily.

## 2022-01-27 ENCOUNTER — Other Ambulatory Visit: Payer: Self-pay | Admitting: Cardiology

## 2022-02-03 ENCOUNTER — Other Ambulatory Visit: Payer: Self-pay

## 2022-02-03 ENCOUNTER — Ambulatory Visit (INDEPENDENT_AMBULATORY_CARE_PROVIDER_SITE_OTHER): Payer: 59

## 2022-02-03 DIAGNOSIS — I4891 Unspecified atrial fibrillation: Secondary | ICD-10-CM

## 2022-02-03 DIAGNOSIS — Z7901 Long term (current) use of anticoagulants: Secondary | ICD-10-CM

## 2022-02-03 LAB — POCT INR: INR: 2.6 (ref 2.0–3.0)

## 2022-02-03 NOTE — Patient Instructions (Signed)
Description   ?- Continue taking 1 tablet daily except 0.5 tablet on Mondays and Fridays.  ?- Recheck INR in 3 weeks.  ?- Stay consistent with greens weekly.  ?[facility] [phone] ? ?Amio 200mg  daily. ?  ?   ?

## 2022-02-08 NOTE — H&P (View-Only) (Signed)
?Cardiology Office Note:   ? ?Date:  02/09/2022  ? ?ID:  Marcus Hicks, DOB 02/19/1966, MRN [id number] ? ?PCP:  Marcus Anes, MD  ?Cardiologist:  Marcus More, MD   ? ?Referring MD: Marcus Hicks,*  ? ? ?ASSESSMENT:   ? ?1. Atypical atrial flutter (Marcus Hicks)   ?2. Long term (current) use of anticoagulants   ?3. S/P AVR (aortic valve replacement) and aortoplasty   ?4. HFrEF (heart failure with reduced ejection fraction) (Marcus Hicks)   ?5. Cirrhosis of liver without ascites, unspecified hepatic cirrhosis type (Marcus Hicks)   ? ?PLAN:   ? ?In order of problems listed above: ? ?Continued atrial arrhythmia now atrial flutter rather than fibrillation following surgical maze procedure we will plan cardioversion as outpatient if effective withdrawal warfarin as his left atrial appendage has been occluded surgically and he is on amiodarone ?Check INR today prior to cardioversion ?Stable normal valve function last ejection fraction had recovered ?Stable no fluid overload continue his current loop diuretic ARB beta-blocker ?The major concern I think long-term we should take him off of warfarin and if cardioversion is effective follow-up in the office and let us stop his Amio. ? ? ?Next appointment: 1 month ? ? ?Medication Adjustments/Labs and Tests Ordered: ?Current medicines are reviewed at length with the patient today.  Concerns regarding medicines are outlined above.  ?No orders of the defined types were placed in this encounter. ? ?No orders of the defined types were placed in this encounter. ? ? ?Chief Complaint  ?Patient presents with  ? Follow-up  ?  Surgery: Heart valve replacement  ? ? ?History of Present Illness:   ? ?Marcus Hicks is a 56 y.o. male with a hx of  persistent atrial fibrillation rate controlled with a beta-blocker and anticoagulated with Eliquis heart failure reduced ejection fraction mild CAD hyperlipidemia hepatic cirrhosis thoracic aortic aneurysm and a bicuspid aortic valve with AS  last  seen by me 10/15/2021 pending aortic valve replacement.  Initially I referred him for consideration of Watchman device because of his cirrhosis.  He had failed antiarrhythmic drug with dofetilide. ? ?He underwent AVR 10/28/2021  ? ?Procedure: ?Aortic valve replacement with a 44mm Inspiris Reslia valve ?Aortic root enlargement  ?MAZE procedure.  Left atrial box lesion set, right atrial SVC, IVC and appendage ablation ?1mm Atriclip placement ? ?He had a post AVR echocardiogram performed my office 01/14/2022 showing normal AVR function left ventricular ejection fraction 50 to 55% and mild right ventricular systolic dysfunction. ? 1. Left ventricular ejection fraction, by estimation, is 50 to 55%. The  ?left ventricle has low normal function. The left ventricle has no regional  ?wall motion abnormalities. There is moderate concentric left ventricular  ?hypertrophy. Left ventricular  ?diastolic parameters are indeterminate.  ? 2. Right ventricular systolic function is mildly reduced. The right  ?ventricular size is normal. There is normal pulmonary artery systolic  ?pressure.  ? 3. The mitral valve is normal in structure. No evidence of mitral valve  ?regurgitation. No evidence of mitral stenosis.  ? 4. Aortic valve regurgitation is not visualized. There is a AORTIC VALVE  ?REPLACEMENT (AVR) WITH INSPIRIS RESILIA AORTIC VALVE SIZE 25MM (N/A) valve  ?present in the aortic position. Procedure Date: 10/28/2021. Echo findings  ?are consistent with normal  ?structure and function of the aortic valve prosthesis.  ? ?His last EKG 10/31/2021 showed atrial fibrillation/flutter right bundle branch block controlled ventricular rate. ? ?Compliance with diet, lifestyle and medications: Yes ? ?Still was not right  when he attributed to ongoing atrial flutter. ?I reviewed his case with Dr. Curt Hicks today ?He remains in atypical atrial flutter with 2 1 conduction ventricular rate 96 bpm ?He is well anticoagulated with warfarin set him up  for cardioversion Marcus Hicks nothing 30 days afterwards we can withdrawal his anticoagulant ?No edema shortness of breath chest pain or syncope ?No bleeding from his anticoagulation ?Past Medical History:  ?Diagnosis Date  ? Atrial fibrillation with rapid ventricular response ([place]) 08/12/2020  ? Bicuspid aortic valve   ? CHF (congestive heart failure) ([place])   ? Chronic hepatitis C virus infection ([place]) 08/03/2013  ? COPD (chronic obstructive pulmonary disease) ([place])   ? Coronary artery disease   ? Diabetes mellitus type 2, insulin dependent ([place])   ? Dysrhythmia   ? A Fib  ? Essential hypertension 07/03/2015  ? Last Assessment & Plan:  Blood pressure is borderline high.  Advised to get better sleep, walk on a regular basis, decrease weight down to about 210 and maintain, and eliminate tobacco.  ? HFrEF (heart failure with reduced ejection fraction) ([place]) 08/22/2020  ? Long-term insulin use ([place]) 10/12/2016  ? Obesity, unspecified 10/12/2016  ? Patient advised to lose down to about 210 pounds and maintained.  Regular walking program recommended.  He knows how to lose weight.  ? Sleep apnea   ? ? ?Past Surgical History:  ?Procedure Laterality Date  ? AORTIC ROOT ENLARGEMENT  10/28/2021  ? Procedure: AORTIC ROOT ENLARGEMENT WITH HEMASHIELD PLATINUM 28MM X 30CM;  Surgeon: Lajuana Matte, MD;  Location: [place];  Service: Open Heart Surgery;;  ? AORTIC VALVE REPLACEMENT N/A 10/28/2021  ? Procedure: AORTIC VALVE REPLACEMENT (AVR) WITH INSPIRIS RESILIA AORTIC VALVE SIZE 25MM;  Surgeon: Lajuana Matte, MD;  Location: [place];  Service: Open Heart Surgery;  Laterality: N/A;  ? CARDIAC CATHETERIZATION    ? CARDIOVERSION N/A 09/24/2020  ? Procedure: CARDIOVERSION;  Surgeon: Donato [name], MD;  Location: [place];  Service: Cardiovascular;  Laterality: N/A;  ? CARDIOVERSION N/A 08/13/2021  ? Procedure: CARDIOVERSION;  Surgeon: Elouise Munroe, MD;  Location: [place];  Service:  Cardiovascular;  Laterality: N/A;  ? CCTA  08/2019  ? CLIPPING OF ATRIAL APPENDAGE N/A 10/28/2021  ? Procedure: CLIPPING OF ATRIAL APPENDAGE WITH ATRICLIP ACHV45;  Surgeon: Lajuana Matte, MD;  Location: [place];  Service: Open Heart Surgery;  Laterality: N/A;  ? MAZE N/A 10/28/2021  ? Procedure: MAZE;  Surgeon: Lajuana Matte, MD;  Location: [place];  Service: Open Heart Surgery;  Laterality: N/A;  ? RIGHT/LEFT HEART CATH AND CORONARY ANGIOGRAPHY N/A 09/26/2021  ? Procedure: RIGHT/LEFT HEART CATH AND CORONARY ANGIOGRAPHY;  Surgeon: Martin[place], Peter M, MD;  Location: [place] CV LAB;  Service: Cardiovascular;  Laterality: N/A;  ? TEE WITHOUT CARDIOVERSION  09/24/2020  ? Procedure: TRANSESOPHAGEAL ECHOCARDIOGRAM (TEE);  Surgeon: Donato [name], MD;  Location: [place];  Service: Cardiovascular;;  ? TEE WITHOUT CARDIOVERSION N/A 10/28/2021  ? Procedure: TRANSESOPHAGEAL ECHOCARDIOGRAM (TEE);  Surgeon: Lajuana Matte, MD;  Location: [place];  Service: Open Heart Surgery;  Laterality: N/A;  ? ? ?Current Medications: ?Current Meds  ?Medication Sig  ? albuterol (PROVENTIL) (2.5 MG/3ML) 0.083% nebulizer solution Take 3 mLs (2.5 mg total) by nebulization every 6 (six) hours as needed for wheezing.  ? ALPRAZolam (XANAX) 0.5 MG tablet TAKE 1 TABLET(0.5 MG) BY MOUTH THREE TIMES DAILY AS NEEDED FOR ANXIETY  ? amiodarone (PACERONE) 400 MG tablet Take 1 tablet (400 mg  total) by mouth daily.  ? aspirin EC 81 MG EC tablet Take 1 tablet (81 mg total) by mouth daily. Swallow whole.  ? furosemide (LASIX) 40 MG tablet Take 1 tablet (40 mg total) by mouth daily.  ? Insulin Degludec (TRESIBA) 100 UNIT/ML SOLN Inject 62 Units into the skin daily.  ? liraglutide (VICTOZA) 18 MG/3ML SOPN ADMINISTER 1.2 MG UNDER THE SKIN DAILY  ? losartan (COZAAR) 50 MG tablet Take 1 tablet (50 mg total) by mouth daily.  ? metFORMIN (GLUCOPHAGE) 500 MG tablet Take 1 tablet (500 mg total) by mouth 2 (two) times daily with a meal. TAKE  1 TABLET(500 MG) BY MOUTH TWICE DAILY WITH A MEAL  ? metoprolol tartrate (LOPRESSOR) 25 MG tablet Take 1 tablet (25 mg total) by mouth 2 (two) times daily.  ? potassium chloride SA (KLOR-CON M) 20 MEQ tablet Tak

## 2022-02-08 NOTE — Progress Notes (Signed)
Cardiology Office Note:    Date:  02/09/2022   ID:  Marcus Hicks, DOB 01-Feb-1966, MRN [id number]  PCP:  Lillard Anes, MD  Cardiologist:  Shirlee More, MD    Referring MD: Lillard Anes,*    ASSESSMENT:    1. Atypical atrial flutter ([place])   2. Long term (current) use of anticoagulants   3. S/P AVR (aortic valve replacement) and aortoplasty   4. HFrEF (heart failure with reduced ejection fraction) ([place])   5. Cirrhosis of liver without ascites, unspecified hepatic cirrhosis type (HCC)    PLAN:    In order of problems listed above:  Continued atrial arrhythmia now atrial flutter rather than fibrillation following surgical maze procedure we will plan cardioversion as outpatient if effective withdrawal warfarin as his left atrial appendage has been occluded surgically and he is on amiodarone Check INR today prior to cardioversion Stable normal valve function last ejection fraction had recovered Stable no fluid overload continue his current loop diuretic ARB beta-blocker The major concern I think long-term we should take him off of warfarin and if cardioversion is effective follow-up in the office and let us stop his Amio.   Next appointment: 1 month   Medication Adjustments/Labs and Tests Ordered: Current medicines are reviewed at length with the patient today.  Concerns regarding medicines are outlined above.  No orders of the defined types were placed in this encounter.  No orders of the defined types were placed in this encounter.   Chief Complaint  Patient presents with   Follow-up    Surgery: Heart valve replacement    History of Present Illness:    Marcus Hicks is a 56 y.o. male with a hx of  persistent atrial fibrillation rate controlled with a beta-blocker and anticoagulated with Eliquis heart failure reduced ejection fraction mild CAD hyperlipidemia hepatic cirrhosis thoracic aortic aneurysm and a bicuspid aortic valve with AS  last  seen by me 10/15/2021 pending aortic valve replacement.  Initially I referred him for consideration of Watchman device because of his cirrhosis.  He had failed antiarrhythmic drug with dofetilide.  He underwent AVR 10/28/2021   Procedure: Aortic valve replacement with a 80mm Inspiris Reslia valve Aortic root enlargement  MAZE procedure.  Left atrial box lesion set, right atrial SVC, IVC and appendage ablation 2mm Atriclip placement  He had a post AVR echocardiogram performed my office 01/14/2022 showing normal AVR function left ventricular ejection fraction 50 to 55% and mild right ventricular systolic dysfunction.  1. Left ventricular ejection fraction, by estimation, is 50 to 55%. The  left ventricle has low normal function. The left ventricle has no regional  wall motion abnormalities. There is moderate concentric left ventricular  hypertrophy. Left ventricular  diastolic parameters are indeterminate.   2. Right ventricular systolic function is mildly reduced. The right  ventricular size is normal. There is normal pulmonary artery systolic  pressure.   3. The mitral valve is normal in structure. No evidence of mitral valve  regurgitation. No evidence of mitral stenosis.   4. Aortic valve regurgitation is not visualized. There is a AORTIC VALVE  REPLACEMENT (AVR) WITH INSPIRIS RESILIA AORTIC VALVE SIZE 25MM (N/A) valve  present in the aortic position. Procedure Date: 10/28/2021. Echo findings  are consistent with normal  structure and function of the aortic valve prosthesis.   His last EKG 10/31/2021 showed atrial fibrillation/flutter right bundle branch block controlled ventricular rate.  Compliance with diet, lifestyle and medications: Yes  Still was not right  when he attributed to ongoing atrial flutter. I reviewed his case with Dr. Curt Bears today He remains in atypical atrial flutter with 2 1 conduction ventricular rate 96 bpm He is well anticoagulated with warfarin set him up  for cardioversion [facility] nothing 30 days afterwards we can withdrawal his anticoagulant No edema shortness of breath chest pain or syncope No bleeding from his anticoagulation Past Medical History:  Diagnosis Date   Atrial fibrillation with rapid ventricular response ([place]) 08/12/2020   Bicuspid aortic valve    CHF (congestive heart failure) (HCC)    Chronic hepatitis C virus infection ([place]) 08/03/2013   COPD (chronic obstructive pulmonary disease) ([place])    Coronary artery disease    Diabetes mellitus type 2, insulin dependent ([place])    Dysrhythmia    A Fib   Essential hypertension 07/03/2015   Last Assessment & Plan:  Blood pressure is borderline high.  Advised to get better sleep, walk on a regular basis, decrease weight down to about 210 and maintain, and eliminate tobacco.   HFrEF (heart failure with reduced ejection fraction) ([place]) 08/22/2020   Long-term insulin use ([place]) 10/12/2016   Obesity, unspecified 10/12/2016   Patient advised to lose down to about 210 pounds and maintained.  Regular walking program recommended.  He knows how to lose weight.   Sleep apnea     Past Surgical History:  Procedure Laterality Date   AORTIC ROOT ENLARGEMENT  10/28/2021   Procedure: AORTIC ROOT ENLARGEMENT WITH HEMASHIELD PLATINUM 28MM X 30CM;  Surgeon: Lajuana Matte, MD;  Location: [place];  Service: Open Heart Surgery;;   AORTIC VALVE REPLACEMENT N/A 10/28/2021   Procedure: AORTIC VALVE REPLACEMENT (AVR) WITH INSPIRIS RESILIA AORTIC VALVE SIZE 25MM;  Surgeon: Lajuana Matte, MD;  Location: [place];  Service: Open Heart Surgery;  Laterality: N/A;   CARDIAC CATHETERIZATION     CARDIOVERSION N/A 09/24/2020   Procedure: CARDIOVERSION;  Surgeon: Donato [name], MD;  Location: [facility] ENDOSCOPY;  Service: Cardiovascular;  Laterality: N/A;   CARDIOVERSION N/A 08/13/2021   Procedure: CARDIOVERSION;  Surgeon: Elouise Munroe, MD;  Location: [place];  Service:  Cardiovascular;  Laterality: N/A;   CCTA  08/2019   CLIPPING OF ATRIAL APPENDAGE N/A 10/28/2021   Procedure: CLIPPING OF ATRIAL APPENDAGE WITH ATRICLIP ACHV45;  Surgeon: Lajuana Matte, MD;  Location: [place];  Service: Open Heart Surgery;  Laterality: N/A;   MAZE N/A 10/28/2021   Procedure: MAZE;  Surgeon: Lajuana Matte, MD;  Location: [place];  Service: Open Heart Surgery;  Laterality: N/A;   RIGHT/LEFT HEART CATH AND CORONARY ANGIOGRAPHY N/A 09/26/2021   Procedure: RIGHT/LEFT HEART CATH AND CORONARY ANGIOGRAPHY;  Surgeon: Martinique, Peter M, MD;  Location: [place] CV LAB;  Service: Cardiovascular;  Laterality: N/A;   TEE WITHOUT CARDIOVERSION  09/24/2020   Procedure: TRANSESOPHAGEAL ECHOCARDIOGRAM (TEE);  Surgeon: Donato [name], MD;  Location: [place];  Service: Cardiovascular;;   TEE WITHOUT CARDIOVERSION N/A 10/28/2021   Procedure: TRANSESOPHAGEAL ECHOCARDIOGRAM (TEE);  Surgeon: Lajuana Matte, MD;  Location: [place];  Service: Open Heart Surgery;  Laterality: N/A;    Current Medications: Current Meds  Medication Sig   albuterol (PROVENTIL) (2.5 MG/3ML) 0.083% nebulizer solution Take 3 mLs (2.5 mg total) by nebulization every 6 (six) hours as needed for wheezing.   ALPRAZolam (XANAX) 0.5 MG tablet TAKE 1 TABLET(0.5 MG) BY MOUTH THREE TIMES DAILY AS NEEDED FOR ANXIETY   amiodarone (PACERONE) 400 MG tablet Take 1 tablet (400 mg  total) by mouth daily.   aspirin EC 81 MG EC tablet Take 1 tablet (81 mg total) by mouth daily. Swallow whole.   furosemide (LASIX) 40 MG tablet Take 1 tablet (40 mg total) by mouth daily.   Insulin Degludec (TRESIBA) 100 UNIT/ML SOLN Inject 62 Units into the skin daily.   liraglutide (VICTOZA) 18 MG/3ML SOPN ADMINISTER 1.2 MG UNDER THE SKIN DAILY   losartan (COZAAR) 50 MG tablet Take 1 tablet (50 mg total) by mouth daily.   metFORMIN (GLUCOPHAGE) 500 MG tablet Take 1 tablet (500 mg total) by mouth 2 (two) times daily with a meal. TAKE  1 TABLET(500 MG) BY MOUTH TWICE DAILY WITH A MEAL   metoprolol tartrate (LOPRESSOR) 25 MG tablet Take 1 tablet (25 mg total) by mouth 2 (two) times daily.   potassium chloride SA (KLOR-CON M) 20 MEQ tablet Take 2 tablets (40 mEq total) by mouth daily.   rosuvastatin (CRESTOR) 40 MG tablet Take 1 tablet (40 mg total) by mouth daily.   umeclidinium-vilanterol (ANORO ELLIPTA) 62.5-25 MCG/ACT AEPB Inhale 1 puff into the lungs daily at 6 (six) AM.   warfarin (COUMADIN) 5 MG tablet TAKE 1-2 TABLETS DAILY or as prescribed by Coumadin Clinic   zolpidem (AMBIEN) 10 MG tablet Take 10 mg by mouth at bedtime as needed for sleep.     Allergies:   Patient has no known allergies.   Social History   Socioeconomic History   Marital status: Widowed    Spouse name: Not on file   Number of children: Not on file   Years of education: Not on file   Highest education level: Not on file  Occupational History   Not on file  Tobacco Use   Smoking status: Former   Smokeless tobacco: Former    Types: Snuff   Tobacco comments:    Former snuff 08/11/2021  Vaping Use   Vaping Use: Never used  Substance and Sexual Activity   Alcohol use: Not Currently   Drug use: Never   Sexual activity: Never  Other Topics Concern   Not on file  Social History Narrative   Not on file   Social Determinants of Health   Financial Resource Strain: Not on file  Food Insecurity: Not on file  Transportation Needs: Not on file  Physical Activity: Not on file  Stress: Not on file  Social Connections: Not on file     Family History: The patient's family history includes Alzheimer's disease in an other family member; Cancer in his maternal grandfather; Hypertension in an other family member; Liver disease in his sister; Memory loss in an other family member. ROS:   Please see the history of present illness.    All other systems reviewed and are negative.  EKGs/Labs/Other Studies Reviewed:    The following studies were  reviewed today: 09/26/2021 Procedures RIGHT/LEFT HEART CATH AND CORONARY ANGIOGRAPHY    Conclusion   Prox LAD to Mid LAD lesion is 35% stenosed.   LV end diastolic pressure is moderately elevated.   Hemodynamic findings consistent with moderate pulmonary hypertension.   Nonobstructive CAD Aortic stenosis. Gradient cannot be accurately measured by pullback given Afib but there is an AV gradient. Moderately elevated LV filling pressures Moderate pulmonary HTN Cardiac index 1.9  EKG:  EKG ordered today and personally reviewed.  The ekg ordered today demonstrates atrial flutter atypical 2 1 conduction he has inverted P waves in the inferior leads.  Continue pattern right bundle branch block  Recent Labs: 06/01/2021:  B Natriuretic Peptide 85.2 08/26/2021: NT-Pro BNP 1,398 10/30/2021: Magnesium 2.8 12/25/2021: ALT 27; BUN 21; Creatinine, Ser 1.11; Hemoglobin 14.5; Platelets 229; Potassium 5.2; Sodium 144  Recent Lipid Panel    Component Value Date/Time   CHOL 142 12/25/2021 1506   TRIG 112 12/25/2021 1506   HDL 50 12/25/2021 1506   CHOLHDL 2.8 12/25/2021 1506   LDLCALC 72 12/25/2021 1506    Physical Exam:    VS:  BP 120/88 (BP Location: Right Arm, Patient Position: Sitting, Cuff Size: Normal)    Pulse 96    Ht 5\' 5"  (1.651 m)    Wt 233 lb (105.7 kg)    SpO2 96%    BMI 38.77 kg/m     Wt Readings from Last 3 Encounters:  02/09/22 233 lb (105.7 kg)  12/25/21 219 lb (99.3 kg)  12/16/21 222 lb (100.7 kg)     GEN:  Well nourished, well developed in no acute distress HEENT: Normal NECK: No JVD; No carotid bruits LYMPHATICS: No lymphadenopathy CARDIAC: RRR, no murmurs, rubs, gallops RESPIRATORY:  Clear to auscultation without rales, wheezing or rhonchi  ABDOMEN: Soft, non-tender, non-distended MUSCULOSKELETAL:  No edema; No deformity  SKIN: Warm and dry NEUROLOGIC:  Alert and oriented x 3 PSYCHIATRIC:  Normal affect    Signed, Shirlee More, MD  02/09/2022 1:40 PM    [facility]

## 2022-02-09 ENCOUNTER — Other Ambulatory Visit: Payer: Self-pay | Admitting: Cardiology

## 2022-02-09 ENCOUNTER — Encounter: Payer: Self-pay | Admitting: Cardiology

## 2022-02-09 ENCOUNTER — Other Ambulatory Visit: Payer: Self-pay

## 2022-02-09 ENCOUNTER — Ambulatory Visit: Payer: 59 | Admitting: Cardiology

## 2022-02-09 VITALS — BP 120/88 | HR 96 | Ht 65.0 in | Wt 233.0 lb

## 2022-02-09 DIAGNOSIS — Z7901 Long term (current) use of anticoagulants: Secondary | ICD-10-CM

## 2022-02-09 DIAGNOSIS — I484 Atypical atrial flutter: Secondary | ICD-10-CM

## 2022-02-09 DIAGNOSIS — I5020 Unspecified systolic (congestive) heart failure: Secondary | ICD-10-CM

## 2022-02-09 DIAGNOSIS — K7460 Unspecified cirrhosis of liver: Secondary | ICD-10-CM

## 2022-02-09 DIAGNOSIS — Z952 Presence of prosthetic heart valve: Secondary | ICD-10-CM

## 2022-02-09 DIAGNOSIS — Z01812 Encounter for preprocedural laboratory examination: Secondary | ICD-10-CM

## 2022-02-09 NOTE — Patient Instructions (Signed)
Medication Instructions:  ?Your physician recommends that you continue on your current medications as directed. Please refer to the Current Medication list given to you today. ? ?*If you need a refill on your cardiac medications before your next appointment, please call your pharmacy* ? ? ?Lab Work: ?See Cardioversion Instructions ?If you have labs (blood work) drawn today and your tests are completely normal, you will receive your results only by: ?MyChart Message (if you have MyChart) OR ?A paper copy in the mail ?If you have any lab test that is abnormal or we need to change your treatment, we will call you to review the results. ? ?Testing/Procedures: ?Dear Marcus Hicks  ?You are scheduled for a TEE/Cardioversion/ on Thursday at 12:00 Noon with Dr. Lavona Mound Tobb.  Please arrive at the [facility] (Main Entrance A) at [facility]: [address] [place], [place] [address] at  11:00 am. (1 hour prior to procedure unless lab work is needed; if lab work is needed arrive 1.5 hours ahead) ? ?DIET: Nothing to eat or drink after midnight except a sip of water with medications (see medication instructions below) ? ?Medication Instructions: ?Hold Furosemide morning of testing ? ?Continue your anticoagulant: Warfarin ?You will need to continue your anticoagulant after your procedure until you  are told by your  ?Provider that it is safe to stop ? ? ?Labs: If patient is on Coumadin, patient needs pt/INR, CBC, BMET within 3 days (No pt/INR needed for patients taking Xarelto, Eliquis, Pradaxa) ?For patients receiving anesthesia for TEE and all Cardioversion patients: BMET, CBC within 1 week ? ?Come to: Our lab to get blood work done: BMP, CBC and INR ?(You must have a responsible person to drive you home and stay in the waiting area during your procedure. Failure to do so could result in cancellation. ? ?Physiological scientist cards. ? ?*Special Note: Every effort is made to have your procedure done on time.  Occasionally there are emergencies that occur at the hospital that may cause delays. Please be patient if a delay does occur.  ? ? ? ?Follow-Up: ?At [facility], you and your health needs are our priority.  As part of our continuing mission to provide you with exceptional heart care, we have created designated Provider Care Teams.  These Care Teams include your primary Cardiologist (physician) and Advanced Practice Providers (APPs -  Physician Assistants and Nurse Practitioners) who all work together to provide you with the care you need, when you need it. ? ?We recommend signing up for the patient portal called "MyChart".  Sign up information is provided on this After Visit Summary.  MyChart is used to connect with patients for Virtual Visits (Telemedicine).  Patients are able to view lab/test results, encounter notes, upcoming appointments, etc.  Non-urgent messages can be sent to your provider as well.   ?To learn more about what you can do with MyChart, go to [email or web address].   ? ?Your next appointment:   ?1 month(s) ? ?The format for your next appointment:   ?In Person ? ?Provider:   ?Norman Herrlich, MD  ? ? ?Other Instructions ?  ?

## 2022-02-09 NOTE — Addendum Note (Signed)
Addended by: Jerl Santos R on: 02/09/2022 02:04 PM ? ? Modules accepted: Orders ? ?

## 2022-02-10 LAB — COMPREHENSIVE METABOLIC PANEL
ALT: 57 IU/L — ABNORMAL HIGH (ref 0–44)
AST: 50 IU/L — ABNORMAL HIGH (ref 0–40)
Albumin/Globulin Ratio: 1.7 (ref 1.2–2.2)
Albumin: 5 g/dL — ABNORMAL HIGH (ref 3.8–4.9)
Alkaline Phosphatase: 72 IU/L (ref 44–121)
BUN/Creatinine Ratio: 18 (ref 9–20)
BUN: 19 mg/dL (ref 6–24)
Bilirubin Total: 0.3 mg/dL (ref 0.0–1.2)
CO2: 27 mmol/L (ref 20–29)
Calcium: 10.3 mg/dL — ABNORMAL HIGH (ref 8.7–10.2)
Chloride: 97 mmol/L (ref 96–106)
Creatinine, Ser: 1.06 mg/dL (ref 0.76–1.27)
Globulin, Total: 2.9 g/dL (ref 1.5–4.5)
Glucose: 188 mg/dL — ABNORMAL HIGH (ref 70–99)
Potassium: 4.6 mmol/L (ref 3.5–5.2)
Sodium: 140 mmol/L (ref 134–144)
Total Protein: 7.9 g/dL (ref 6.0–8.5)
eGFR: 82 mL/min/{1.73_m2} (ref >59)

## 2022-02-10 LAB — CBC
Hematocrit: 47.6 % (ref 37.5–51.0)
Hemoglobin: 16 g/dL (ref 13.0–17.7)
MCH: 28.6 pg (ref 26.6–33.0)
MCHC: 33.6 g/dL (ref 31.5–35.7)
MCV: 85 fL (ref 79–97)
Platelets: 210 10*3/uL (ref 150–450)
RBC: 5.6 x10E6/uL (ref 4.14–5.80)
RDW: 14.4 % (ref 11.6–15.4)
WBC: 7.1 10*3/uL (ref 3.4–10.8)

## 2022-02-10 LAB — PROTIME-INR
INR: 2.3 — ABNORMAL HIGH (ref 0.9–1.2)
Prothrombin Time: 22.8 s — ABNORMAL HIGH (ref 9.1–12.0)

## 2022-02-11 ENCOUNTER — Telehealth: Payer: Self-pay | Admitting: Cardiology

## 2022-02-11 NOTE — Telephone Encounter (Signed)
?*  STAT* If patient is at the pharmacy, call can be transferred to refill team. ? ? ?1. Which medications need to be refilled? (please list name of each medication and dose if known)  ?amiodarone (PACERONE) 400 MG tablet ? ?2. Which pharmacy/location (including street and city if local pharmacy) is medication to be sent to? [facility] [phone] - [place], [place] - [address] ? ?3. Do they need a 30 day or 90 day supply? 90 day ?

## 2022-02-12 MED ORDER — AMIODARONE HCL 400 MG PO TABS: 400.0000 mg | ORAL_TABLET | Freq: Every day | ORAL | 1 refills | Status: DC

## 2022-02-12 NOTE — Telephone Encounter (Signed)
Informed patient of his lab results from 02/09/22 that per Dr Dulce Sellar labs were good.  ?Patient requested refill on amiodarone while on phone call as well. Instructed patient that I will send refill to his pharmacy [facility] in [place]  ?

## 2022-02-12 NOTE — Telephone Encounter (Signed)
-----   Message from Richardo Priest, MD sent at 02/10/2022  7:38 AM EDT ----- ?Regarding: FW: ?Good result ?Please forward copy to [facility] Coumadin clinic ?----- Message ----- ?From: Interface, [facility] Lab Results In ?Sent: 02/10/2022   5:38 AM EDT ?To: Richardo Priest, MD ? ?

## 2022-02-18 ENCOUNTER — Encounter (HOSPITAL_COMMUNITY): Payer: Self-pay | Admitting: Cardiology

## 2022-02-24 ENCOUNTER — Other Ambulatory Visit: Payer: Self-pay

## 2022-02-24 ENCOUNTER — Ambulatory Visit (INDEPENDENT_AMBULATORY_CARE_PROVIDER_SITE_OTHER): Payer: 59

## 2022-02-24 DIAGNOSIS — Z952 Presence of prosthetic heart valve: Secondary | ICD-10-CM | POA: Diagnosis not present

## 2022-02-24 DIAGNOSIS — Z7901 Long term (current) use of anticoagulants: Secondary | ICD-10-CM

## 2022-02-24 DIAGNOSIS — I4891 Unspecified atrial fibrillation: Secondary | ICD-10-CM | POA: Diagnosis not present

## 2022-02-24 LAB — POCT INR: INR: 2 (ref 2.0–3.0)

## 2022-02-24 NOTE — Patient Instructions (Signed)
Description   ?- Take 1.5 tablets today and then continue taking 1 tablet daily except 0.5 tablet on Mondays and Fridays.  ?- Recheck INR in 4 weeks.  ?- Stay consistent with greens weekly.  ?Coumadin Clinic [phone] ? ?Amio 200mg  daily. ?  ?   ?

## 2022-02-26 ENCOUNTER — Encounter (HOSPITAL_COMMUNITY): Payer: Self-pay | Admitting: Cardiology

## 2022-02-26 ENCOUNTER — Ambulatory Visit (HOSPITAL_BASED_OUTPATIENT_CLINIC_OR_DEPARTMENT_OTHER): Payer: 59 | Admitting: Anesthesiology

## 2022-02-26 ENCOUNTER — Encounter (HOSPITAL_COMMUNITY)
Admission: RE | Disposition: A | Discharge status: Home / Self Care | Payer: Self-pay | Source: Home / Self Care | Attending: Cardiology

## 2022-02-26 ENCOUNTER — Ambulatory Visit (HOSPITAL_COMMUNITY)
Admission: RE | Admit: 2022-02-26 | Discharge: 2022-02-26 | Disposition: A | Discharge status: Home / Self Care | Payer: 59 | Attending: Cardiology | Admitting: Cardiology

## 2022-02-26 ENCOUNTER — Ambulatory Visit (HOSPITAL_COMMUNITY): Payer: 59 | Admitting: Anesthesiology

## 2022-02-26 DIAGNOSIS — I110 Hypertensive heart disease with heart failure: Secondary | ICD-10-CM | POA: Diagnosis not present

## 2022-02-26 DIAGNOSIS — J449 Chronic obstructive pulmonary disease, unspecified: Secondary | ICD-10-CM | POA: Diagnosis not present

## 2022-02-26 DIAGNOSIS — K7460 Unspecified cirrhosis of liver: Secondary | ICD-10-CM | POA: Insufficient documentation

## 2022-02-26 DIAGNOSIS — I10 Essential (primary) hypertension: Secondary | ICD-10-CM

## 2022-02-26 DIAGNOSIS — E119 Type 2 diabetes mellitus without complications: Secondary | ICD-10-CM | POA: Diagnosis not present

## 2022-02-26 DIAGNOSIS — I350 Nonrheumatic aortic (valve) stenosis: Secondary | ICD-10-CM | POA: Insufficient documentation

## 2022-02-26 DIAGNOSIS — Z7984 Long term (current) use of oral hypoglycemic drugs: Secondary | ICD-10-CM | POA: Insufficient documentation

## 2022-02-26 DIAGNOSIS — I4510 Unspecified right bundle-branch block: Secondary | ICD-10-CM | POA: Diagnosis not present

## 2022-02-26 DIAGNOSIS — I2510 Atherosclerotic heart disease of native coronary artery without angina pectoris: Secondary | ICD-10-CM | POA: Insufficient documentation

## 2022-02-26 DIAGNOSIS — I4891 Unspecified atrial fibrillation: Secondary | ICD-10-CM | POA: Diagnosis not present

## 2022-02-26 DIAGNOSIS — I484 Atypical atrial flutter: Secondary | ICD-10-CM

## 2022-02-26 DIAGNOSIS — Z87891 Personal history of nicotine dependence: Secondary | ICD-10-CM | POA: Insufficient documentation

## 2022-02-26 DIAGNOSIS — Z952 Presence of prosthetic heart valve: Secondary | ICD-10-CM | POA: Insufficient documentation

## 2022-02-26 DIAGNOSIS — B182 Chronic viral hepatitis C: Secondary | ICD-10-CM | POA: Diagnosis not present

## 2022-02-26 DIAGNOSIS — G4730 Sleep apnea, unspecified: Secondary | ICD-10-CM | POA: Insufficient documentation

## 2022-02-26 DIAGNOSIS — I272 Pulmonary hypertension, unspecified: Secondary | ICD-10-CM | POA: Insufficient documentation

## 2022-02-26 DIAGNOSIS — I5022 Chronic systolic (congestive) heart failure: Secondary | ICD-10-CM | POA: Diagnosis not present

## 2022-02-26 HISTORY — PX: CARDIOVERSION: SHX1299

## 2022-02-26 LAB — PROTIME-INR
INR: 2.8 — ABNORMAL HIGH (ref 0.8–1.2)
Prothrombin Time: 29.4 seconds — ABNORMAL HIGH (ref 11.4–15.2)

## 2022-02-26 LAB — GLUCOSE, CAPILLARY: Glucose-Capillary: 205 mg/dL — ABNORMAL HIGH (ref 70–99)

## 2022-02-26 SURGERY — CARDIOVERSION
Anesthesia: General

## 2022-02-26 MED ORDER — LIDOCAINE 2% (20 MG/ML) 5 ML SYRINGE
INTRAMUSCULAR | Status: DC | PRN
  Administered 2022-02-26: 60 mg via INTRAVENOUS

## 2022-02-26 MED ORDER — ONDANSETRON HCL 4 MG/2ML IJ SOLN
INTRAMUSCULAR | Status: DC | PRN
  Administered 2022-02-26: 4 mg via INTRAVENOUS

## 2022-02-26 MED ORDER — SODIUM CHLORIDE 0.9 % IV SOLN: INTRAVENOUS | Status: DC

## 2022-02-26 MED ORDER — PROPOFOL 10 MG/ML IV BOLUS
INTRAVENOUS | Status: DC | PRN
  Administered 2022-02-26: 60 mg via INTRAVENOUS

## 2022-02-26 NOTE — Anesthesia Postprocedure Evaluation (Signed)
Anesthesia Post Note ? ?Patient: Marcus Hicks ? ?Procedure(s) Performed: CARDIOVERSION ? ?  ? ?Patient location during evaluation: Endoscopy ?Anesthesia Type: General ?Level of consciousness: awake and sedated ?Pain management: pain level controlled ?Vital Signs Assessment: post-procedure vital signs reviewed and stable ?Respiratory status: spontaneous breathing ?Cardiovascular status: stable ?Postop Assessment: no apparent nausea or vomiting ?Anesthetic complications: no ? ? ?No notable events documented. ? ?Last Vitals:  ?Vitals:  ? 02/26/22 1156 02/26/22 1339  ?BP:  (!) 118/30  ?Pulse:  67  ?Resp: 18 20  ?Temp: (!) 36.3 ?C 36.7 ?C  ?SpO2:  95%  ?  ?Last Pain:  ?Vitals:  ? 02/26/22 1339  ?TempSrc: Temporal  ?PainSc: 0-No pain  ? ? ?  ?  ?  ?  ?  ?  ? ?Caren Macadam ? ? ? ? ?

## 2022-02-26 NOTE — Transfer of Care (Signed)
Immediate Anesthesia Transfer of Care Note ? ?Patient: Marcus Hicks ? ?Procedure(s) Performed: CARDIOVERSION ? ?Patient Location: PACU and Endoscopy Unit ? ?Anesthesia Type:General ? ?Level of Consciousness: drowsy ? ?Airway & Oxygen Therapy: Patient Spontanous Breathing ? ?Post-op Assessment: Report given to RN and Post -op Vital signs reviewed and stable ? ?Post vital signs: Reviewed and stable ? ?Last Vitals:  ?Vitals Value Taken Time  ?BP    ?Temp    ?Pulse    ?Resp    ?SpO2    ? ? ?Last Pain:  ?Vitals:  ? 02/26/22 1156  ?TempSrc: Temporal  ?PainSc: 0-No pain  ?   ? ?  ? ?Complications: No notable events documented. ?

## 2022-02-26 NOTE — CV Procedure (Signed)
? ?  Electrical Cardioversion Procedure Note ?Marcus Hicks ?[id number] ?1966/11/15 ? ?Procedure: Electrical Cardioversion ?Indications:  Atrial Fibrillation ? ?Time Out: Verified patient identification, verified procedure,medications/allergies/relevent history reviewed, required imaging and test results available.  Performed ? ?Procedure Details ? ?The patient signed informed consent.   The patient was NPO past midnight. Has had therapeutic anticoagulation with warfarin (INR 2.8 today) greater than 3 weeks. The patient denies any interruption of anticoagulation.  Anesthesia was administered by Dr. Arby Barrette.  Adequate airway was maintained throughout and vital followed per protocol.  He was cardioverted x 1 with 200 J of biphasic synchronized energy.  He converted to NSR.  There were no apparent complications.  The patient tolerated the procedure well and had normal neuro status and respiratory status post procedure with vitals stable as recorded elsewhere.   ? ? ?IMPRESSION: ? ?Successful cardioversion of atrial fibrillation ? ? ?Follow up:  We will arrange follow up with Dr. Dulce Sellar.  He will continue on current medical therapy.  The patient advised to continue anticoagulation. ? ?Thomasene Ripple ?02/26/2022, 2:14 PM ?  ?

## 2022-02-26 NOTE — Anesthesia Preprocedure Evaluation (Signed)
Anesthesia Evaluation  ?Patient identified by MRN, date of birth, ID band ?Patient awake ? ? ? ?Reviewed: ?Allergy & Precautions, NPO status , Patient's Chart, lab work & pertinent test results, reviewed documented beta blocker date and time  ? ?Airway ?Mallampati: II ? ?TM Distance: >3 FB ?Neck ROM: Full ? ? ? Dental ? ?(+) Teeth Intact ?  ?Pulmonary ?sleep apnea , COPD, former smoker,  ?  ?Pulmonary exam normal ? ? ? ? ? ? ? Cardiovascular ?hypertension, Pt. on home beta blockers and Pt. on medications ?+ dysrhythmias Atrial Fibrillation  ?Rhythm:Irregular Rate:Abnormal ? ? ?  ?Neuro/Psych ?negative neurological ROS ? negative psych ROS  ? GI/Hepatic ?negative GI ROS, (+) Hepatitis -  ?Endo/Other  ?diabetes, Type 2, Oral Hypoglycemic Agents ? Renal/GU ?  ? ?  ?Musculoskeletal ? ? Abdominal ?(+) + obese,   ?Peds ? Hematology ?  ?Anesthesia Other Findings ?IMPRESSIONS  ? ? ??1. Left ventricular ejection fraction, by estimation, is 50 to 55%. The  ?left ventricle has low normal function. The left ventricle has no regional  ?wall motion abnormalities. There is moderate concentric left ventricular  ?hypertrophy. Left ventricular  ?diastolic parameters are indeterminate.  ??2. Right ventricular systolic function is mildly reduced. The right  ?ventricular size is normal. There is normal pulmonary artery systolic  ?pressure.  ??3. The mitral valve is normal in structure. No evidence of mitral valve  ?regurgitation. No evidence of mitral stenosis.  ??4. Aortic valve regurgitation is not visualized. There is a AORTIC VALVE  ?REPLACEMENT (AVR) WITH INSPIRIS RESILIA AORTIC VALVE SIZE (N/A) valve  ?present in the aortic position. Procedure Date: 10/28/2021. Echo findings  ?are consistent with normal  ?structure and function of the aortic valve prosthesis.  ??5. Aortic Normal DTA.  ??6. The inferior vena cava is dilated in size with >50% respiratory  ?variability, suggesting right atrial  pressure of 8 mmHg.  ? ?FINDINGS ? Reproductive/Obstetrics ? ?  ? ? ? ? ? ? ? ? ? ? ? ? ? ?  ?  ? ? ? ? ? ? ? ? ?Anesthesia Physical ? ?Anesthesia Plan ? ?ASA: 3 ? ?Anesthesia Plan: General  ? ?Post-op Pain Management:   ? ?Induction:  ? ?PONV Risk Score and Plan: 2 and Propofol infusion and TIVA ? ?Airway Management Planned: Natural Airway and Simple Face Mask ? ?Additional Equipment: None ? ?Intra-op Plan:  ? ?Post-operative Plan:  ? ?Informed Consent: I have reviewed the patients History and Physical, chart, labs and discussed the procedure including the risks, benefits and alternatives for the proposed anesthesia with the patient or authorized representative who has indicated his/her understanding and acceptance.  ? ? ? ? ? ?Plan Discussed with: CRNA ? ?Anesthesia Plan Comments:   ? ? ? ? ? ? ?Anesthesia Quick Evaluation ? ?

## 2022-02-26 NOTE — Interval H&P Note (Signed)
History and Physical Interval Note: ? ?02/26/2022 ?1:26 PM ? ?Marcus Hicks  has presented today for surgery, with the diagnosis of AFIB.  The various methods of treatment have been discussed with the patient and family. After consideration of risks, benefits and other options for treatment, the patient has consented to  Procedure(s): ?CARDIOVERSION (N/A) as a surgical intervention.  The patient's history has been reviewed, patient examined, no change in status, stable for surgery.  I have reviewed the patient's chart and labs.  Questions were answered to the patient's satisfaction.   ? ? ?Marcus Hicks ? ? ?

## 2022-02-27 ENCOUNTER — Encounter (HOSPITAL_COMMUNITY): Payer: Self-pay | Admitting: Cardiology

## 2022-03-05 ENCOUNTER — Other Ambulatory Visit: Payer: Self-pay | Admitting: Legal Medicine

## 2022-03-05 DIAGNOSIS — E1142 Type 2 diabetes mellitus with diabetic polyneuropathy: Secondary | ICD-10-CM

## 2022-03-24 ENCOUNTER — Ambulatory Visit: Payer: 59

## 2022-03-24 DIAGNOSIS — Z7901 Long term (current) use of anticoagulants: Secondary | ICD-10-CM

## 2022-03-24 DIAGNOSIS — Z952 Presence of prosthetic heart valve: Secondary | ICD-10-CM | POA: Diagnosis not present

## 2022-03-24 DIAGNOSIS — I4891 Unspecified atrial fibrillation: Secondary | ICD-10-CM

## 2022-03-24 LAB — POCT INR: INR: 3.3 — AB (ref 2.0–3.0)

## 2022-03-24 NOTE — Patient Instructions (Signed)
Description   ?- Take 0.5 tablet today and then continue taking 1 tablet daily except 0.5 tablet on Mondays and Fridays.  ?- Recheck INR in 3 weeks.  ?- Stay consistent with greens weekly.  ?Coumadin Clinic [phone] ? ?Amio 200mg  daily. ?  ?   ?

## 2022-03-26 DIAGNOSIS — Q231 Congenital insufficiency of aortic valve: Secondary | ICD-10-CM | POA: Insufficient documentation

## 2022-03-26 DIAGNOSIS — I499 Cardiac arrhythmia, unspecified: Secondary | ICD-10-CM | POA: Insufficient documentation

## 2022-03-26 DIAGNOSIS — I509 Heart failure, unspecified: Secondary | ICD-10-CM | POA: Insufficient documentation

## 2022-03-26 DIAGNOSIS — I2510 Atherosclerotic heart disease of native coronary artery without angina pectoris: Secondary | ICD-10-CM | POA: Insufficient documentation

## 2022-03-27 ENCOUNTER — Ambulatory Visit: Payer: 59 | Admitting: Cardiology

## 2022-03-27 ENCOUNTER — Encounter: Payer: Self-pay | Admitting: Cardiology

## 2022-03-27 VITALS — BP 128/78 | HR 64 | Ht 65.0 in | Wt 231.6 lb

## 2022-03-27 DIAGNOSIS — Z952 Presence of prosthetic heart valve: Secondary | ICD-10-CM

## 2022-03-27 DIAGNOSIS — I5042 Chronic combined systolic (congestive) and diastolic (congestive) heart failure: Secondary | ICD-10-CM

## 2022-03-27 DIAGNOSIS — Z79899 Other long term (current) drug therapy: Secondary | ICD-10-CM | POA: Diagnosis not present

## 2022-03-27 DIAGNOSIS — I484 Atypical atrial flutter: Secondary | ICD-10-CM

## 2022-03-27 DIAGNOSIS — E782 Mixed hyperlipidemia: Secondary | ICD-10-CM

## 2022-03-27 DIAGNOSIS — I10 Essential (primary) hypertension: Secondary | ICD-10-CM

## 2022-03-27 DIAGNOSIS — Z7901 Long term (current) use of anticoagulants: Secondary | ICD-10-CM | POA: Diagnosis not present

## 2022-03-27 MED ORDER — FUROSEMIDE 20 MG PO TABS: 20.0000 mg | ORAL_TABLET | Freq: Every day | ORAL | 3 refills | Status: DC

## 2022-03-27 MED ORDER — AMIODARONE HCL 200 MG PO TABS: 200.0000 mg | ORAL_TABLET | Freq: Every day | ORAL | 3 refills | Status: DC

## 2022-03-27 NOTE — Patient Instructions (Signed)
Medication Instructions:  ?Your physician has recommended you make the following change in your medication:  ? ?START: Furosemide 20 mg daily ?START: Amiodarone 200 mg daily, then after the next refill reduce it to 200 mg every other day ?STOP: Warfarin tomorrow  ?START: Aspirin 81 mg daily  ? ?*If you need a refill on your cardiac medications before your next appointment, please call our pharmacy* ? ? ?Lab Work: ?Your physician recommends that you return for lab work in:  ? ?Labs today: CMP, TSH, T3, T4 ? ?If you have labs (blood work) drawn today and your tests are completely normal, you will receive your results only by: ?MyChart Message (if you have MyChart) OR ?A paper copy in the mail ?If you have any lab test that is abnormal or we need to change your treatment, we will call you to review the results. ? ? ?Testing/Procedures: ?None ? ? ?Follow-Up: ?At [facility], you and your health needs are our priority.  As part of our continuing mission to provide you with exceptional heart care, we have created designated Provider Care Teams.  These Care Teams include your primary Cardiologist (physician) and Advanced Practice Providers (APPs -  Physician Assistants and Nurse Practitioners) who all work together to provide you with the care you need, when you need it. ? ?We recommend signing up for the patient portal called "MyChart".  Sign up information is provided on this After Visit Summary.  MyChart is used to connect with patients for Virtual Visits (Telemedicine).  Patients are able to view lab/test results, encounter notes, upcoming appointments, etc.  Non-urgent messages can be sent to your provider as well.   ?To learn more about what you can do with MyChart, go to [email or web address].   ? ?Your next appointment:   ?3 month(s) ? ?The format for your next appointment:   ?In Person ? ?Provider:   ?Norman Herrlich, MD  ? ? ?Other Instructions ?Purchase Apple watch to monitor for A-fib. ? ?Important  Information About Sugar ? ? ? ? ? ? ?

## 2022-03-27 NOTE — Progress Notes (Signed)
?Cardiology Office Note:   ? ?Date:  03/27/2022  ? ?ID:  Marcus Hicks, DOB 1966/04/04, MRN [id number] ? ?PCP:  Lillard Anes, MD  ?Cardiologist:  Shirlee More, MD   ? ?Referring MD: Lillard Anes,*  ? ? ?ASSESSMENT:   ? ?1. Atypical atrial flutter ([place])   ?2. Long term (current) use of anticoagulants   ?3. On amiodarone therapy   ?4. S/P AVR (aortic valve replacement) and aortoplasty   ?5. Chronic combined systolic and diastolic heart failure ([place])   ?6. Essential hypertension   ?7. Mixed hyperlipidemia   ? ?PLAN:   ? ?In order of problems listed above: ? ?Matter is doing much better and is maintaining sinus rhythm on amiodarone we will decrease down to 100 mg daily over the next month recheck labs for liver and thyroid toxicity and transition from anticoagulant aspirin with his self monitoring of heart rhythm with an Apple Watch. ?Discontinue warfarin transition to coated aspirin 81 mg daily with sinus rhythm 1 month from cardioversion underlying liver disease and tissue AVR ?Heart failure is nicely compensated his ejection fraction is normalized we will reduce his diuretic and recheck renal function potassium ?Well-controlled continue current treatment ARB beta-blocker ?Continue with statin ? ? ?Next appointment: 3 months ? ? ?Medication Adjustments/Labs and Tests Ordered: ?Current medicines are reviewed at length with the patient today.  Concerns regarding medicines are outlined above.  ?Orders Placed This Encounter  ?Procedures  ? Comp Met (CMET)  ? TSH+T4F+T3Free  ? EKG 12-Lead  ? ?Meds ordered this encounter  ?Medications  ? furosemide (LASIX) 20 MG tablet  ?  Sig: Take 1 tablet (20 mg total) by mouth daily.  ?  Dispense:  90 tablet  ?  Refill:  3  ? amiodarone (PACERONE) 200 MG tablet  ?  Sig: Take 1 tablet (200 mg total) by mouth daily.  ?  Dispense:  90 tablet  ?  Refill:  3  ? ? ?Chief Complaint  ?Patient presents with  ? Follow-up  ? Atrial Fibrillation  ? ? ?History of Present  Illness:   ? ?Marcus Hicks is a 56 y.o. male with a hx of persistent atrial fibrillation heart failure with reduced ejection fraction mild CAD hyperlipidemia hepatic cirrhosis thoracic aortic aneurysm bicuspid aortic valve with aortic stenosis underwent CT surgery 10/28/2021 with a 25 mm Inspiris Resilia valve aortic root enlargement surgical maze procedure SVC IVC and appendage ablation and a 45 mm atrial clip placement last seen 02/09/2022.  His echocardiogram after surgery EF is improved 50 to 55%. ? ?He was converted to sinus rhythm 02/26/2022. ? ?Compliance with diet, lifestyle and medications: Yes ? ?He feels improved back to normal and intends to take a trip to the [place] ?No chest pain edema shortness of breath palpitation or syncope.  He remains anticoagulated approaching 30 days.  We will discontinue warfarin and transition aspirin ?He is concerned about amiodarone liver disease we will reduce dose down to 100 mg daily ?Recheck his liver and thyroid test ?Having no edema reduce his diuretic dosage ?He plans to purchase an Apple watch to screen for recurrent atrial fibrillation ? ?Past Medical History:  ?Diagnosis Date  ? Atrial fibrillation with rapid ventricular response ([place]) 08/12/2020  ? Bicuspid aortic valve   ? CHF (congestive heart failure) ([place])   ? Chronic hepatitis C virus infection ([place]) 08/03/2013  ? COPD (chronic obstructive pulmonary disease) ([place])   ? Coronary artery disease   ? Diabetes mellitus type 2,  insulin dependent ([place])   ? Dysrhythmia   ? A Fib  ? Essential hypertension 07/03/2015  ? Last Assessment & Plan:  Blood pressure is borderline high.  Advised to get better sleep, walk on a regular basis, decrease weight down to about 210 and maintain, and eliminate tobacco.  ? HFrEF (heart failure with reduced ejection fraction) ([place]) 08/22/2020  ? Long-term insulin use ([place]) 10/12/2016  ? Obesity, unspecified 10/12/2016  ? Patient advised to lose down to about 210 pounds and  maintained.  Regular walking program recommended.  He knows how to lose weight.  ? Sleep apnea   ? ? ?Past Surgical History:  ?Procedure Laterality Date  ? AORTIC ROOT ENLARGEMENT  10/28/2021  ? Procedure: AORTIC ROOT ENLARGEMENT WITH HEMASHIELD PLATINUM 28MM X 30CM;  Surgeon: Lajuana Matte, MD;  Location: [place];  Service: Open Heart Surgery;;  ? AORTIC VALVE REPLACEMENT N/A 10/28/2021  ? Procedure: AORTIC VALVE REPLACEMENT (AVR) WITH INSPIRIS RESILIA AORTIC VALVE SIZE 25MM;  Surgeon: Lajuana Matte, MD;  Location: [place];  Service: Open Heart Surgery;  Laterality: N/A;  ? CARDIAC CATHETERIZATION    ? CARDIOVERSION N/A 09/24/2020  ? Procedure: CARDIOVERSION;  Surgeon: Donato [name], MD;  Location: [place];  Service: Cardiovascular;  Laterality: N/A;  ? CARDIOVERSION N/A 08/13/2021  ? Procedure: CARDIOVERSION;  Surgeon: Elouise Munroe, MD;  Location: [place];  Service: Cardiovascular;  Laterality: N/A;  ? CARDIOVERSION N/A 02/26/2022  ? Procedure: CARDIOVERSION;  Surgeon: Berniece Salines, DO;  Location: [place];  Service: Cardiovascular;  Laterality: N/A;  ? CCTA  08/2019  ? CLIPPING OF ATRIAL APPENDAGE N/A 10/28/2021  ? Procedure: CLIPPING OF ATRIAL APPENDAGE WITH ATRICLIP ACHV45;  Surgeon: Lajuana Matte, MD;  Location: [place];  Service: Open Heart Surgery;  Laterality: N/A;  ? MAZE N/A 10/28/2021  ? Procedure: MAZE;  Surgeon: Lajuana Matte, MD;  Location: [place];  Service: Open Heart Surgery;  Laterality: N/A;  ? RIGHT/LEFT HEART CATH AND CORONARY ANGIOGRAPHY N/A 09/26/2021  ? Procedure: RIGHT/LEFT HEART CATH AND CORONARY ANGIOGRAPHY;  Surgeon: Martinique, Peter M, MD;  Location: [place] CV LAB;  Service: Cardiovascular;  Laterality: N/A;  ? TEE WITHOUT CARDIOVERSION  09/24/2020  ? Procedure: TRANSESOPHAGEAL ECHOCARDIOGRAM (TEE);  Surgeon: Donato [name], MD;  Location: [place];  Service: Cardiovascular;;  ? TEE WITHOUT CARDIOVERSION N/A 10/28/2021  ?  Procedure: TRANSESOPHAGEAL ECHOCARDIOGRAM (TEE);  Surgeon: Lajuana Matte, MD;  Location: [place];  Service: Open Heart Surgery;  Laterality: N/A;  ? ? ?Current Medications: ?Current Meds  ?Medication Sig  ? albuterol (PROVENTIL) (2.5 MG/3ML) 0.083% nebulizer solution Take 3 mLs (2.5 mg total) by nebulization every 6 (six) hours as needed for wheezing.  ? ALPRAZolam (XANAX) 0.5 MG tablet TAKE 1 TABLET(0.5 MG) BY MOUTH THREE TIMES DAILY AS NEEDED FOR ANXIETY (Patient taking differently: Take 0.5 mg by mouth daily as needed for anxiety.)  ? amiodarone (PACERONE) 200 MG tablet Take 1 tablet (200 mg total) by mouth daily.  ? aspirin EC 81 MG EC tablet Take 1 tablet (81 mg total) by mouth daily. Swallow whole.  ? furosemide (LASIX) 20 MG tablet Take 1 tablet (20 mg total) by mouth daily.  ? Insulin Degludec 100 UNIT/ML SOLN Inject 62 Units into the skin daily.  ? liraglutide (VICTOZA) 18 MG/3ML SOPN ADMINISTER 1.2 MG UNDER THE SKIN DAILY  ? losartan (COZAAR) 50 MG tablet Take 1 tablet (50 mg total) by mouth daily.  ? metFORMIN (GLUCOPHAGE) 500 MG  tablet Take 1 tablet (500 mg total) by mouth 2 (two) times daily with a meal. TAKE 1 TABLET(500 MG) BY MOUTH TWICE DAILY WITH A MEAL (Patient taking differently: Take 500 mg by mouth 2 (two) times daily with a meal.)  ? metoprolol tartrate (LOPRESSOR) 25 MG tablet Take 1 tablet (25 mg total) by mouth 2 (two) times daily.  ? potassium chloride SA (KLOR-CON M) 20 MEQ tablet Take 2 tablets (40 mEq total) by mouth daily. (Patient taking differently: Take 20 mEq by mouth 2 (two) times daily.)  ? rosuvastatin (CRESTOR) 40 MG tablet Take 1 tablet (40 mg total) by mouth daily.  ? umeclidinium-vilanterol (ANORO ELLIPTA) 62.5-25 MCG/ACT AEPB Inhale 1 puff into the lungs daily at 6 (six) AM.  ? zolpidem (AMBIEN) 10 MG tablet Take 10 mg by mouth at bedtime as needed for sleep.  ? [DISCONTINUED] amiodarone (PACERONE) 400 MG tablet Take 1 tablet (400 mg total) by mouth daily.  ?  [DISCONTINUED] furosemide (LASIX) 40 MG tablet Take 1 tablet (40 mg total) by mouth daily.  ? [DISCONTINUED] warfarin (COUMADIN) 5 MG tablet TAKE 1-2 TABLETS DAILY or as prescribed by [facility] (Patient takin

## 2022-03-28 LAB — COMPREHENSIVE METABOLIC PANEL
ALT: 72 IU/L — ABNORMAL HIGH (ref 0–44)
AST: 69 IU/L — ABNORMAL HIGH (ref 0–40)
Albumin/Globulin Ratio: 1.9 (ref 1.2–2.2)
Albumin: 5 g/dL — ABNORMAL HIGH (ref 3.8–4.9)
Alkaline Phosphatase: 71 IU/L (ref 44–121)
BUN/Creatinine Ratio: 16 (ref 9–20)
BUN: 20 mg/dL (ref 6–24)
Bilirubin Total: 0.4 mg/dL (ref 0.0–1.2)
CO2: 29 mmol/L (ref 20–29)
Calcium: 10.3 mg/dL — ABNORMAL HIGH (ref 8.7–10.2)
Chloride: 97 mmol/L (ref 96–106)
Creatinine, Ser: 1.25 mg/dL (ref 0.76–1.27)
Globulin, Total: 2.6 g/dL (ref 1.5–4.5)
Glucose: 141 mg/dL — ABNORMAL HIGH (ref 70–99)
Potassium: 4.8 mmol/L (ref 3.5–5.2)
Sodium: 141 mmol/L (ref 134–144)
Total Protein: 7.6 g/dL (ref 6.0–8.5)
eGFR: 67 mL/min/{1.73_m2} (ref >59)

## 2022-03-28 LAB — TSH+T4F+T3FREE
Free T4: 1.42 ng/dL (ref 0.82–1.77)
T3, Free: 3.1 pg/mL (ref 2.0–4.4)
TSH: 1.28 u[IU]/mL (ref 0.450–4.500)

## 2022-03-31 ENCOUNTER — Encounter: Payer: Self-pay | Admitting: Legal Medicine

## 2022-03-31 ENCOUNTER — Ambulatory Visit (INDEPENDENT_AMBULATORY_CARE_PROVIDER_SITE_OTHER): Payer: 59 | Admitting: Legal Medicine

## 2022-03-31 VITALS — BP 130/70 | HR 64 | Temp 97.8°F | Resp 16 | Ht 65.0 in | Wt 230.0 lb

## 2022-03-31 DIAGNOSIS — I5020 Unspecified systolic (congestive) heart failure: Secondary | ICD-10-CM | POA: Diagnosis not present

## 2022-03-31 DIAGNOSIS — Z09 Encounter for follow-up examination after completed treatment for conditions other than malignant neoplasm: Secondary | ICD-10-CM

## 2022-03-31 DIAGNOSIS — I10 Essential (primary) hypertension: Secondary | ICD-10-CM

## 2022-03-31 DIAGNOSIS — Z9889 Other specified postprocedural states: Secondary | ICD-10-CM

## 2022-03-31 DIAGNOSIS — I712 Thoracic aortic aneurysm, without rupture, unspecified: Secondary | ICD-10-CM

## 2022-03-31 DIAGNOSIS — I4819 Other persistent atrial fibrillation: Secondary | ICD-10-CM | POA: Diagnosis not present

## 2022-03-31 DIAGNOSIS — E1142 Type 2 diabetes mellitus with diabetic polyneuropathy: Secondary | ICD-10-CM | POA: Diagnosis not present

## 2022-03-31 DIAGNOSIS — E782 Mixed hyperlipidemia: Secondary | ICD-10-CM

## 2022-03-31 DIAGNOSIS — K7460 Unspecified cirrhosis of liver: Secondary | ICD-10-CM

## 2022-03-31 DIAGNOSIS — G4733 Obstructive sleep apnea (adult) (pediatric): Secondary | ICD-10-CM

## 2022-03-31 DIAGNOSIS — Z952 Presence of prosthetic heart valve: Secondary | ICD-10-CM

## 2022-03-31 DIAGNOSIS — J449 Chronic obstructive pulmonary disease, unspecified: Secondary | ICD-10-CM

## 2022-03-31 HISTORY — DX: Thoracic aortic aneurysm, without rupture, unspecified: I71.20

## 2022-03-31 HISTORY — DX: Other specified postprocedural states: Z98.890

## 2022-03-31 NOTE — Progress Notes (Signed)
? ?Subjective:  ?Patient ID: Marcus Hicks, male    DOB: Feb 24, 1966  Age: 56 y.o. MRN: [id number] ? ?Chief Complaint  ?Patient presents with  ? Hypertension  ? Diabetes  ? COPD  ? ? ?HPI: chronic visit ? ? Diabetes: Patient taking metformin 500 twice a day, Victoza 1.2 mg under the skin daily, insulin degludec inject 62 units daily, He checks his blood sugar daily and the range is 199-117 mg/dl. Last A1c 6.6. ? ?Afib: Patient is taking Amiodarone 200 mg daily, aspirin 81 mg daily. ?He is converted to NSR, amiodarone being tapered to 100mg . ? ?Hyperlipidemia: He takes rosuvastatin 40 mg daily. ? Patient presents with hyperlipidemia.  Compliance with treatment has been good; patient takes medicines as directed, maintains low cholesterol diet, follows up as directed, and maintains exercise regimen.  Patient is using crestor without problems.  ? ?Hypertension:  Taking Furosemide 20 mg daily, losartan 50 mg daily, metoprolol 25 mg twice a day. ?Current Outpatient Medications on File Prior to Visit  ?Medication Sig Dispense Refill  ? albuterol (PROVENTIL) (2.5 MG/3ML) 0.083% nebulizer solution Take 3 mLs (2.5 mg total) by nebulization every 6 (six) hours as needed for wheezing.    ? ALPRAZolam (XANAX) 0.5 MG tablet TAKE 1 TABLET(0.5 MG) BY MOUTH THREE TIMES DAILY AS NEEDED FOR ANXIETY (Patient taking differently: Take 0.5 mg by mouth daily as needed for anxiety.) 90 tablet 3  ? amiodarone (PACERONE) 200 MG tablet Take 1 tablet (200 mg total) by mouth daily. 90 tablet 3  ? aspirin EC 81 MG EC tablet Take 1 tablet (81 mg total) by mouth daily. Swallow whole. 30 tablet 11  ? furosemide (LASIX) 20 MG tablet Take 1 tablet (20 mg total) by mouth daily. 90 tablet 3  ? Insulin Degludec 100 UNIT/ML SOLN Inject 62 Units into the skin daily.    ? liraglutide (VICTOZA) 18 MG/3ML SOPN ADMINISTER 1.2 MG UNDER THE SKIN DAILY 12 mL 3  ? losartan (COZAAR) 50 MG tablet Take 1 tablet (50 mg total) by mouth daily. 90 tablet 2  ?  metFORMIN (GLUCOPHAGE) 500 MG tablet Take 1 tablet (500 mg total) by mouth 2 (two) times daily with a meal. TAKE 1 TABLET(500 MG) BY MOUTH TWICE DAILY WITH A MEAL (Patient taking differently: Take 500 mg by mouth 2 (two) times daily with a meal.)    ? metoprolol tartrate (LOPRESSOR) 25 MG tablet Take 1 tablet (25 mg total) by mouth 2 (two) times daily. 180 tablet 2  ? potassium chloride SA (KLOR-CON M) 20 MEQ tablet Take 2 tablets (40 mEq total) by mouth daily. (Patient taking differently: Take 20 mEq by mouth 2 (two) times daily.) 20 tablet 14  ? rosuvastatin (CRESTOR) 40 MG tablet Take 1 tablet (40 mg total) by mouth daily. 90 tablet 3  ? umeclidinium-vilanterol (ANORO ELLIPTA) 62.5-25 MCG/ACT AEPB Inhale 1 puff into the lungs daily at 6 (six) AM. 1 each 6  ? zolpidem (AMBIEN) 10 MG tablet Take 10 mg by mouth at bedtime as needed for sleep.    ? ?No current facility-administered medications on file prior to visit.  ? ?Past Medical History:  ?Diagnosis Date  ? Atrial fibrillation with rapid ventricular response ([facility]) 08/12/2020  ? Bicuspid aortic valve   ? CHF (congestive heart failure) ([facility])   ? Chronic hepatitis C virus infection ([facility]) 08/03/2013  ? COPD (chronic obstructive pulmonary disease) ([facility])   ? Coronary artery disease   ? Diabetes mellitus type 2, insulin dependent ([facility])   ?  Dysrhythmia   ? A Fib  ? Essential hypertension 07/03/2015  ? Last Assessment & Plan:  Blood pressure is borderline high.  Advised to get better sleep, walk on a regular basis, decrease weight down to about 210 and maintain, and eliminate tobacco.  ? HFrEF (heart failure with reduced ejection fraction) ([facility]) 08/22/2020  ? Long-term insulin use ([facility]) 10/12/2016  ? Obesity, unspecified 10/12/2016  ? Patient advised to lose down to about 210 pounds and maintained.  Regular walking program recommended.  He knows how to lose weight.  ? Sleep apnea   ? ?Past Surgical History:  ?Procedure Laterality Date  ? AORTIC ROOT ENLARGEMENT   10/28/2021  ? Procedure: AORTIC ROOT ENLARGEMENT WITH HEMASHIELD PLATINUM X 30CM;  Surgeon: Corliss Skains, MD;  [facility];  Service: Open Heart Surgery;;  ? AORTIC VALVE REPLACEMENT N/A 10/28/2021  ? Procedure: AORTIC VALVE REPLACEMENT (AVR) WITH INSPIRIS RESILIA AORTIC VALVE SIZE ;  Surgeon: Corliss Skains, MD;  Location: [facility] OR;  Service: Open Heart Surgery;  Laterality: N/A;  ? CARDIAC CATHETERIZATION    ? CARDIOVERSION N/A 09/24/2020  ? Procedure: CARDIOVERSION;  Surgeon: [name], MD;  Location[facility] ENDOSCOPY;  Service: Cardiovascular;  Laterality: N/A;  ? CARDIOVERSION N/A 08/13/2021  ? Procedure: CARDIOVERSION;  Surgeon: [name], MD;  Location: [facility] ENDOSCOPY;  Service: Cardiovascular;  Laterality: N/A;  ? CARDIOVERSION N/A 02/26/2022  ? Procedure: CARDIOVERSION;  Surgeon: Thomasene Ripple, DO;  Location: [facility] ENDOSCOPY;  Service: Cardiovascular;  Laterality: N/A;  ? CCTA  08/2019  ? CLIPPING OF ATRIAL APPENDAGE N/A 10/28/2021  ? Procedure: CLIPPING OF ATRIAL APPENDAGE WITH ATRICLIP ACHV45;  Surgeon: Corliss Skains, MD;  Location: [facility] OR;  Service: Open Heart Surgery;  Laterality: N/A;  ? MAZE N/A 10/28/2021  ? Procedure: MAZE;  Surgeon: Corliss Skains, MD;  Location: [facility] OR;  Service: Open Heart Surgery;  Laterality: N/A;  ? RIGHT/LEFT HEART CATH AND CORONARY ANGIOGRAPHY N/A 09/26/2021  ? Procedure: RIGHT/LEFT HEART CATH AND CORONARY ANGIOGRAPHY;  Surgeon: [place], Peter M, MD;  Location: [facility] INVASIVE CV LAB;  Service: Cardiovascular;  Laterality: N/A;  ? TEE WITHOUT CARDIOVERSION  09/24/2020  ? Procedure: TRANSESOPHAGEAL ECHOCARDIOGRAM (TEE);  Surgeon: [name], MD;  Location: [facility] ENDOSCOPY;  Service: Cardiovascular;;  ? TEE WITHOUT CARDIOVERSION N/A 10/28/2021  ? Procedure: TRANSESOPHAGEAL ECHOCARDIOGRAM (TEE);  Surgeon: Corliss Skains, MD;  Location: [facility] OR;  Service: Open Heart Surgery;  Laterality: N/A;  ?  ?Family History  ?Problem  Relation Age of Onset  ? Cancer Maternal Grandfather   ? Liver disease Sister   ? Alzheimer's disease Other   ? Memory loss Other   ? Hypertension Other   ? ?Social History  ? ?Socioeconomic History  ? Marital status: Widowed  ?  Spouse name: Not on file  ? Number of children: Not on file  ? Years of education: Not on file  ? Highest education level: Not on file  ?Occupational History  ? Not on file  ?Tobacco Use  ? Smoking status: Former  ?  Passive exposure: Past  ? Smokeless tobacco: Former  ?  Types: Snuff  ? Tobacco comments:  ?  Former snuff 08/11/2021  ?Vaping Use  ? Vaping Use: Never used  ?Substance and Sexual Activity  ? Alcohol use: Not Currently  ? Drug use: Never  ? Sexual activity: Never  ?Other Topics Concern  ? Not on file  ?Social History Narrative  ? Not on file  ? ?  Social Determinants of Health  ? ?Financial Resource Strain: Not on file  ?Food Insecurity: Not on file  ?Transportation Needs: Not on file  ?Physical Activity: Not on file  ?Stress: Not on file  ?Social Connections: Not on file  ? ? ?Review of Systems  ?Constitutional:  Negative for chills, fatigue, fever and unexpected weight change.  ?HENT:  Negative for congestion, ear pain, sinus pain and sore throat.   ?Eyes:  Negative for visual disturbance.  ?Respiratory:  Positive for shortness of breath.   ?Cardiovascular:  Negative for chest pain and palpitations.  ?Gastrointestinal:  Negative for abdominal pain, blood in stool, constipation, diarrhea, nausea and vomiting.  ?Endocrine: Negative for polydipsia.  ?Genitourinary:  Negative for dysuria.  ?Musculoskeletal:  Negative for back pain.  ?Skin:  Negative for rash.  ?Neurological:  Negative for headaches.  ?Psychiatric/Behavioral: Negative.    ? ? ?Objective:  ?BP 130/70   Pulse 64   Temp 97.8 ?F (36.6 ?C)   Resp 16   Ht 5\' 5"  (1.651 m)   Wt 230 lb (104.3 kg)   SpO2 94%   BMI 38.27 kg/m?  ? ? ?  03/31/2022  ?  1:24 PM 03/27/2022  ?  3:10 PM 02/26/2022  ?  2:00 PM  ?BP/Weight   ?Systolic BP 130 128 127  ?Diastolic BP 70 78 73  ?Wt. (Lbs) 230 231.6   ?BMI 38.27 kg/m2 38.54 kg/m2   ? ? ?Physical Exam ?Vitals reviewed.  ?Constitutional:   ?   General: He is not in acute distress. ?   Appearance: Normal appea

## 2022-04-01 ENCOUNTER — Other Ambulatory Visit: Payer: Self-pay

## 2022-04-01 DIAGNOSIS — K7460 Unspecified cirrhosis of liver: Secondary | ICD-10-CM

## 2022-04-01 LAB — COMPREHENSIVE METABOLIC PANEL
ALT: 65 IU/L — ABNORMAL HIGH (ref 0–44)
AST: 71 IU/L — ABNORMAL HIGH (ref 0–40)
Albumin/Globulin Ratio: 2 (ref 1.2–2.2)
Albumin: 5.2 g/dL — ABNORMAL HIGH (ref 3.8–4.9)
Alkaline Phosphatase: 62 IU/L (ref 44–121)
BUN/Creatinine Ratio: 21 — ABNORMAL HIGH (ref 9–20)
BUN: 25 mg/dL — ABNORMAL HIGH (ref 6–24)
Bilirubin Total: 0.5 mg/dL (ref 0.0–1.2)
CO2: 23 mmol/L (ref 20–29)
Calcium: 10.1 mg/dL (ref 8.7–10.2)
Chloride: 101 mmol/L (ref 96–106)
Creatinine, Ser: 1.18 mg/dL (ref 0.76–1.27)
Globulin, Total: 2.6 g/dL (ref 1.5–4.5)
Glucose: 115 mg/dL — ABNORMAL HIGH (ref 70–99)
Potassium: 5.2 mmol/L (ref 3.5–5.2)
Sodium: 142 mmol/L (ref 134–144)
Total Protein: 7.8 g/dL (ref 6.0–8.5)
eGFR: 72 mL/min/{1.73_m2} (ref >59)

## 2022-04-01 LAB — CBC WITH DIFFERENTIAL/PLATELET
Basophils Absolute: 0 10*3/uL (ref 0.0–0.2)
Basos: 1 %
EOS (ABSOLUTE): 0.2 10*3/uL (ref 0.0–0.4)
Eos: 3 %
Hematocrit: 43.7 % (ref 37.5–51.0)
Hemoglobin: 15.1 g/dL (ref 13.0–17.7)
Immature Grans (Abs): 0 10*3/uL (ref 0.0–0.1)
Immature Granulocytes: 0 %
Lymphocytes Absolute: 1.1 10*3/uL (ref 0.7–3.1)
Lymphs: 18 %
MCH: 29.3 pg (ref 26.6–33.0)
MCHC: 34.6 g/dL (ref 31.5–35.7)
MCV: 85 fL (ref 79–97)
Monocytes Absolute: 0.6 10*3/uL (ref 0.1–0.9)
Monocytes: 9 %
Neutrophils Absolute: 4.2 10*3/uL (ref 1.4–7.0)
Neutrophils: 69 %
Platelets: 187 10*3/uL (ref 150–450)
RBC: 5.15 x10E6/uL (ref 4.14–5.80)
RDW: 14.7 % (ref 11.6–15.4)
WBC: 6 10*3/uL (ref 3.4–10.8)

## 2022-04-01 LAB — LIPID PANEL
Chol/HDL Ratio: 3.6 ratio (ref 0.0–5.0)
Cholesterol, Total: 151 mg/dL (ref 100–199)
HDL: 42 mg/dL (ref >39)
LDL Chol Calc (NIH): 80 mg/dL (ref 0–99)
Triglycerides: 170 mg/dL — ABNORMAL HIGH (ref 0–149)
VLDL Cholesterol Cal: 29 mg/dL (ref 5–40)

## 2022-04-01 LAB — CARDIOVASCULAR RISK ASSESSMENT

## 2022-04-01 LAB — HEMOGLOBIN A1C
Est. average glucose Bld gHb Est-mCnc: 186 mg/dL
Hgb A1c MFr Bld: 8.1 % — ABNORMAL HIGH (ref 4.8–5.6)

## 2022-04-01 NOTE — Progress Notes (Signed)
Glucose 115, BUN high- probably some dehydration, liver tests elevated but stable, A1c 8.1 high, need to stay on diet, Triglycerides 170 high watch diet, CBC normal,  ?lp

## 2022-04-07 ENCOUNTER — Telehealth: Payer: Self-pay | Admitting: Cardiology

## 2022-04-07 ENCOUNTER — Ambulatory Visit: Payer: 59

## 2022-04-07 DIAGNOSIS — K7460 Unspecified cirrhosis of liver: Secondary | ICD-10-CM

## 2022-04-07 DIAGNOSIS — I4891 Unspecified atrial fibrillation: Secondary | ICD-10-CM

## 2022-04-07 DIAGNOSIS — Z7901 Long term (current) use of anticoagulants: Secondary | ICD-10-CM

## 2022-04-07 NOTE — Telephone Encounter (Signed)
Patient stated he needs a refill on potassium chloride. He also would like to know if it is something he should continue taking.  ?Please advise.  ?

## 2022-04-07 NOTE — Progress Notes (Signed)
Per office visit on 03/27/22, Dr Dulce Sellar discontinued Warfarin. Dr Dulce Sellar also instructed for pt to have labs drawn this month to recheck  for liver and thyroid toxicity. Pt made aware that INR does not need to be checked today since Warfarin was discontinued and pt was directed to go to lab.   ?

## 2022-04-07 NOTE — Telephone Encounter (Signed)
Called patient back about his message. Patient's lasix was decreased to 20 mg from 40 mg on 03/27/22. Patient's Potassium was not changed and patient has been taking 20 meq twice daily. Patient's last CMET on 03/31/22 potassium was 5.2. Patient had repeat lab work today. Informed patient that he could hold his second dose of potassium for now, and we will see if he can go down to taking potassium 20 meq daily instead. Informed patient that we might need to wait for results of today's test to make any potassium dose changes. Will forward message to Dr. Tomie China, who is covering for Dr. Dulce Sellar. ?

## 2022-04-08 LAB — COMPREHENSIVE METABOLIC PANEL
ALT: 81 IU/L — ABNORMAL HIGH (ref 0–44)
AST: 72 IU/L — ABNORMAL HIGH (ref 0–40)
Albumin/Globulin Ratio: 1.9 (ref 1.2–2.2)
Albumin: 5.3 g/dL — ABNORMAL HIGH (ref 3.8–4.9)
Alkaline Phosphatase: 69 IU/L (ref 44–121)
BUN/Creatinine Ratio: 14 (ref 9–20)
BUN: 19 mg/dL (ref 6–24)
Bilirubin Total: 0.5 mg/dL (ref 0.0–1.2)
CO2: 25 mmol/L (ref 20–29)
Calcium: 10.4 mg/dL — ABNORMAL HIGH (ref 8.7–10.2)
Chloride: 101 mmol/L (ref 96–106)
Creatinine, Ser: 1.32 mg/dL — ABNORMAL HIGH (ref 0.76–1.27)
Globulin, Total: 2.8 g/dL (ref 1.5–4.5)
Glucose: 106 mg/dL — ABNORMAL HIGH (ref 70–99)
Potassium: 5 mmol/L (ref 3.5–5.2)
Sodium: 143 mmol/L (ref 134–144)
Total Protein: 8.1 g/dL (ref 6.0–8.5)
eGFR: 63 mL/min/{1.73_m2} (ref >59)

## 2022-04-08 LAB — THYROID PANEL WITH TSH
Free Thyroxine Index: 3.3 (ref 1.2–4.9)
T3 Uptake Ratio: 29 % (ref 24–39)
T4, Total: 11.3 ug/dL (ref 4.5–12.0)
TSH: 1.36 u[IU]/mL (ref 0.450–4.500)

## 2022-04-08 MED ORDER — POTASSIUM CHLORIDE CRYS ER 20 MEQ PO TBCR
20.0000 meq | EXTENDED_RELEASE_TABLET | Freq: Every day | ORAL | 14 refills | Status: DC
Start: 2022-04-08 — End: 2022-04-09

## 2022-04-08 NOTE — Telephone Encounter (Signed)
Recommendations reviewed with pt as per Dr. Revankar's note.  Pt verbalized understanding and had no additional questions.   

## 2022-04-09 ENCOUNTER — Other Ambulatory Visit: Payer: Self-pay

## 2022-04-09 MED ORDER — ASPIRIN 81 MG PO TBEC: 81.0000 mg | DELAYED_RELEASE_TABLET | Freq: Every day | ORAL | 3 refills | Status: DC

## 2022-04-09 MED ORDER — POTASSIUM CHLORIDE CRYS ER 20 MEQ PO TBCR: 20.0000 meq | EXTENDED_RELEASE_TABLET | Freq: Every day | ORAL | 3 refills | Status: DC

## 2022-04-17 ENCOUNTER — Telehealth: Payer: Self-pay | Admitting: Cardiology

## 2022-04-17 ENCOUNTER — Other Ambulatory Visit: Payer: Self-pay

## 2022-04-17 MED ORDER — POTASSIUM CHLORIDE CRYS ER 20 MEQ PO TBCR
20.0000 meq | EXTENDED_RELEASE_TABLET | Freq: Every day | ORAL | 3 refills | Status: DC
Start: 2022-04-17 — End: 2022-06-11

## 2022-04-17 NOTE — Telephone Encounter (Signed)
Called patient and refilled his potassium prescription. Patient was very appreciative and had no further questions at this time.

## 2022-04-17 NOTE — Telephone Encounter (Signed)
 *  STAT* If patient is at the pharmacy, call can be transferred to refill team.   1. Which medications need to be refilled? (please list name of each medication and dose if known) potassium chloride SA (KLOR-CON M) 20 MEQ tablet  2. Which pharmacy/location (including street and city if local pharmacy) is medication to be sent to? [facility] [phone] - [place], [place] - [address] AT [address]  3. Do they need a 30 day or 90 day supply? 90 days  Pt said, per pharmacy they did receive refill. Please resend refill today, pt is out of meds

## 2022-04-23 ENCOUNTER — Other Ambulatory Visit: Payer: Self-pay | Admitting: Legal Medicine

## 2022-04-28 ENCOUNTER — Other Ambulatory Visit: Payer: Self-pay

## 2022-04-28 DIAGNOSIS — E1142 Type 2 diabetes mellitus with diabetic polyneuropathy: Secondary | ICD-10-CM

## 2022-04-28 DIAGNOSIS — F5102 Adjustment insomnia: Secondary | ICD-10-CM

## 2022-04-28 MED ORDER — MICROLET LANCETS MISC: 1.0000 | Freq: Every day | 2 refills | Status: DC

## 2022-04-28 MED ORDER — ZOLPIDEM TARTRATE 10 MG PO TABS: 10.0000 mg | ORAL_TABLET | Freq: Every evening | ORAL | 0 refills | Status: DC | PRN

## 2022-05-06 ENCOUNTER — Ambulatory Visit: Payer: 59 | Admitting: Podiatry

## 2022-05-08 ENCOUNTER — Other Ambulatory Visit: Payer: Self-pay

## 2022-05-08 DIAGNOSIS — K7460 Unspecified cirrhosis of liver: Secondary | ICD-10-CM

## 2022-05-09 LAB — COMPREHENSIVE METABOLIC PANEL
ALT: 72 IU/L — ABNORMAL HIGH (ref 0–44)
AST: 68 IU/L — ABNORMAL HIGH (ref 0–40)
Albumin/Globulin Ratio: 1.9 (ref 1.2–2.2)
Albumin: 5 g/dL — ABNORMAL HIGH (ref 3.8–4.9)
Alkaline Phosphatase: 75 IU/L (ref 44–121)
BUN/Creatinine Ratio: 8 — ABNORMAL LOW (ref 9–20)
BUN: 11 mg/dL (ref 6–24)
Bilirubin Total: 0.4 mg/dL (ref 0.0–1.2)
CO2: 24 mmol/L (ref 20–29)
Calcium: 9.6 mg/dL (ref 8.7–10.2)
Chloride: 99 mmol/L (ref 96–106)
Creatinine, Ser: 1.31 mg/dL — ABNORMAL HIGH (ref 0.76–1.27)
Globulin, Total: 2.7 g/dL (ref 1.5–4.5)
Glucose: 130 mg/dL — ABNORMAL HIGH (ref 70–99)
Potassium: 4.7 mmol/L (ref 3.5–5.2)
Sodium: 140 mmol/L (ref 134–144)
Total Protein: 7.7 g/dL (ref 6.0–8.5)
eGFR: 63 mL/min/{1.73_m2} (ref >59)

## 2022-05-13 ENCOUNTER — Ambulatory Visit (HOSPITAL_BASED_OUTPATIENT_CLINIC_OR_DEPARTMENT_OTHER): Payer: 59 | Attending: Legal Medicine | Admitting: Internal Medicine

## 2022-05-13 DIAGNOSIS — G4733 Obstructive sleep apnea (adult) (pediatric): Secondary | ICD-10-CM | POA: Diagnosis present

## 2022-05-17 DIAGNOSIS — G4733 Obstructive sleep apnea (adult) (pediatric): Secondary | ICD-10-CM | POA: Diagnosis not present

## 2022-05-17 NOTE — Procedures (Signed)
Patient Name: Marcus Hicks, Marcus Hicks Date: 05/13/2022 Gender: Male D.O.B: 07/01/1966 Age (years): 56 Referring Provider: Brent Bulla Height (inches): 65 Interpreting Physician: Jetty Duhamel MD, ABSM Weight (lbs): 230 RPSGT: Shelah Lewandowsky BMI: 38 MRN: [id number] Neck Size: 19.00  CLINICAL INFORMATION Sleep Study Type: NPSG Indication for sleep study: COPD, Diabetes, Hypertension, Morbid Obesity Epworth Sleepiness Score: 5  SLEEP STUDY TECHNIQUE As per the AASM Manual for the Scoring of Sleep and Associated Events v2.3 (April 2016) with a hypopnea requiring 4% desaturations.  The channels recorded and monitored were frontal, central and occipital EEG, electrooculogram (EOG), submentalis EMG (chin), nasal and oral airflow, thoracic and abdominal wall motion, anterior tibialis EMG, snore microphone, electrocardiogram, and pulse oximetry.  MEDICATIONS Medications self-administered by patient taken the night of the study : ALPRAZOLAM  SLEEP ARCHITECTURE The study was initiated at 10:22:51 PM and ended at 5:05:17 AM.  Sleep onset time was 27.6 minutes and the sleep efficiency was 75.4%%. The total sleep time was 303.5 minutes.  Stage REM latency was 266.5 minutes.  The patient spent 12.7%% of the night in stage N1 sleep, 76.3%% in stage N2 sleep, 0.0%% in stage N3 and 11% in REM.  Alpha intrusion was absent.  Supine sleep was 0.00%.  RESPIRATORY PARAMETERS The overall apnea/hypopnea index (AHI) was 5.1 per hour. There were 1 total apneas, including 0 obstructive, 1 central and 0 mixed apneas. There were 25 hypopneas and 6 RERAs.  The AHI during Stage REM sleep was 39.4 per hour.  AHI while supine was N/A per hour.  The mean oxygen saturation was 91.8%. The minimum SpO2 during sleep was 81.0%.  moderate snoring was noted during this study.  CARDIAC DATA The 2 lead EKG demonstrated sinus rhythm. The mean heart rate was 69.0 beats per minute. Other EKG findings  include: None.  LEG MOVEMENT DATA The total PLMS were 0 with a resulting PLMS index of 0.0. Associated arousal with leg movement index was 0.0 .  IMPRESSIONS - Mild obstructive sleep apnea occurred during this study (AHI = 5.1/h). - No significant central sleep apnea occurred during this study (CAI = 0.2/h). - Mild oxygen desaturation was noted during this study (Min O2 = 81.0%). Mean 91.8%. Time with O2 saturation 88% or less was 13.3 minutes. - The patient snored with moderate snoring volume. - No cardiac abnormalities were noted during this study. - Clinically significant periodic limb movements did not occur during sleep. No significant associated arousals.  DIAGNOSIS - Obstructive Sleep Apnea (G47.33) - Nocturnal Hypoxemia (G47.36)  RECOMMENDATIONS - Treatment for minimal OSA is guided by symptoms and co-morbidity. Conservative measures may include observation, weight loss and sleep position off back. Other options including CPAP, a fitted oral appliance, or ENT evaluation, would be based on clinical judgment. This patient may qualify for supplemental O2 during sleep. - Be careful with alcohol, sedatives and other CNS depressants that may worsen sleep apnea and disrupt normal sleep architecture. - Sleep hygiene should be reviewed to assess factors that may improve sleep quality. - Weight management and regular exercise should be initiated or continued if appropriate.  [Electronically signed] 05/17/2022 01:15 PM  Jetty Duhamel MD, ABSM Diplomate, American Board of Sleep Medicine NPI: [id number]                        Jetty Duhamel Diplomate, American Board of Sleep Medicine  ELECTRONICALLY SIGNED ON:  05/17/2022, 1:11 PM [facility] PH: [phone]  FX: [phone] [place]

## 2022-05-29 ENCOUNTER — Telehealth: Payer: Self-pay | Admitting: Cardiology

## 2022-05-29 NOTE — Telephone Encounter (Signed)
Patient c/o Palpitations:  High priority if patient c/o lightheadedness, shortness of breath, or chest pain  How long have you had palpitations/irregular HR/ Afib? Are you having the symptoms now?  No  Are you currently experiencing lightheadedness, SOB or CP? No  Do you have a history of afib (atrial fibrillation) or irregular heart rhythm? Yes  Have you checked your BP or HR? (document readings if available):        NO  Are you experiencing any other symptoms? No    Patient stated his watch is recording that his HR is 72 bpm and showing signs of afib.  Oxygen was 95 this morning.

## 2022-05-29 NOTE — Telephone Encounter (Signed)
Patient called because his watch showed that he was in A-fib. Patient stated that he doesn't believe he is in A-fib because he has been in A-fib before and he is not feeling the symptoms like he did when he was in A-fib. His heart rate is 72 bpm and his oxygen saturation is 95%. He is not SOB and has no chest pain or chest discomfort. He states that he feels fine and doesn't want to do anything about it at this time. He will be coming in for a lab draw on 7/5 and will give [place] an update at that time. I encouraged him that if he had any symptoms of A-fib to go to the ER and he stated that he would do that. Patient had no further questions at this time.

## 2022-06-03 ENCOUNTER — Other Ambulatory Visit: Payer: Self-pay | Admitting: Legal Medicine

## 2022-06-03 DIAGNOSIS — E1142 Type 2 diabetes mellitus with diabetic polyneuropathy: Secondary | ICD-10-CM

## 2022-06-07 ENCOUNTER — Other Ambulatory Visit: Payer: Self-pay | Admitting: Legal Medicine

## 2022-06-07 DIAGNOSIS — J449 Chronic obstructive pulmonary disease, unspecified: Secondary | ICD-10-CM

## 2022-06-09 ENCOUNTER — Other Ambulatory Visit: Payer: Self-pay

## 2022-06-09 DIAGNOSIS — K7460 Unspecified cirrhosis of liver: Secondary | ICD-10-CM

## 2022-06-09 NOTE — Progress Notes (Unsigned)
cmp

## 2022-06-10 LAB — COMPREHENSIVE METABOLIC PANEL
ALT: 75 IU/L — ABNORMAL HIGH (ref 0–44)
AST: 55 IU/L — ABNORMAL HIGH (ref 0–40)
Albumin/Globulin Ratio: 2.1 (ref 1.2–2.2)
Albumin: 4.6 g/dL (ref 3.8–4.9)
Alkaline Phosphatase: 75 IU/L (ref 44–121)
BUN/Creatinine Ratio: 9 (ref 9–20)
BUN: 11 mg/dL (ref 6–24)
Bilirubin Total: 0.5 mg/dL (ref 0.0–1.2)
CO2: 28 mmol/L (ref 20–29)
Calcium: 9.5 mg/dL (ref 8.7–10.2)
Chloride: 102 mmol/L (ref 96–106)
Creatinine, Ser: 1.24 mg/dL (ref 0.76–1.27)
Globulin, Total: 2.2 g/dL (ref 1.5–4.5)
Glucose: 78 mg/dL (ref 70–99)
Potassium: 3.4 mmol/L — ABNORMAL LOW (ref 3.5–5.2)
Sodium: 144 mmol/L (ref 134–144)
Total Protein: 6.8 g/dL (ref 6.0–8.5)
eGFR: 68 mL/min/{1.73_m2} (ref >59)

## 2022-06-11 ENCOUNTER — Other Ambulatory Visit: Payer: Self-pay

## 2022-06-11 DIAGNOSIS — I4891 Unspecified atrial fibrillation: Secondary | ICD-10-CM

## 2022-06-11 MED ORDER — POTASSIUM CHLORIDE CRYS ER 20 MEQ PO TBCR: 20.0000 meq | EXTENDED_RELEASE_TABLET | Freq: Two times a day (BID) | ORAL | 3 refills | Status: DC

## 2022-06-16 ENCOUNTER — Telehealth: Payer: Self-pay | Admitting: Cardiology

## 2022-06-16 NOTE — Telephone Encounter (Signed)
Patient would like to switch from Dr. Lalla Brothers to Dr. Elberta Fortis because the patient would rather be seen in [place] instead of [place]. Please let the patient know what the office decides

## 2022-06-29 DIAGNOSIS — E119 Type 2 diabetes mellitus without complications: Secondary | ICD-10-CM | POA: Insufficient documentation

## 2022-06-29 DIAGNOSIS — Q231 Congenital insufficiency of aortic valve: Secondary | ICD-10-CM | POA: Insufficient documentation

## 2022-06-29 NOTE — Progress Notes (Unsigned)
Cardiology Office Note:    Date:  06/30/2022   ID:  Marcus Hicks, DOB Apr 23, 1966, MRN [id number]  PCP:  Abigail Miyamoto, MD  Cardiologist:  Norman Herrlich, MD    Referring MD: Abigail Miyamoto,*    ASSESSMENT:    1. Paroxysmal atrial fibrillation (HCC)   2. On amiodarone therapy   3. S/P AVR (aortic valve replacement) and aortoplasty   4. Chronic combined systolic and diastolic heart failure (HCC)   5. Hypertensive heart disease with heart failure (HCC)    PLAN:    In order of problems listed above:  Is a very complex case he has had aortic valve replacement surgical maze on amiodarone and has required cardioversion postoperatively and today has an atrial tachycardia/slow atrial flutter 2 1 conduction I will bump up his amiodarone to 40 mg a day recheck an EKG in 1 week and I think he should go back and be seen by EP again.  Obviously will not stop amiodarone he is not anticoagulated having surgical left atrial appendage occlusion. Stable following aortic valve replacement normal valve function bioprosthetic thickening low-dose aspirin Compensated continue his loop diuretic his ejection fraction is normalized continue his beta-blocker statin I am concerned with his underlying liver disease mildly abnormal LFTs and I would appreciate the input of EP about antiarrhythmic drug therapy with the potential of liver toxicity with long-term amiodarone therapy   Next appointment: 3 months   Medication Adjustments/Labs and Tests Ordered: Current medicines are reviewed at length with the patient today.  Concerns regarding medicines are outlined above.  Orders Placed This Encounter  Procedures   Ambulatory referral to Cardiac Electrophysiology   EKG 12-Lead   Meds ordered this encounter  Medications   amiodarone (PACERONE) 200 MG tablet    Sig: Take 1 tablet (200 mg total) by mouth 2 (two) times daily.    Dispense:  180 tablet    Refill:  3   tadalafil (CIALIS) 5 MG  tablet    Sig: Take 1 tablet (5 mg total) by mouth daily as needed for erectile dysfunction.    Dispense:  10 tablet    Refill:  0   Chief complaint follow-up atrial fibrillation flutter on amiodarone   History of Present Illness:    Marcus Hicks is a 56 y.o. male with a hx of persistent atrial fibrillation heart failure with reduced ejection fraction mild CAD hyperlipidemia hepatic cirrhosis thoracic aortic aneurysm bicuspid aortic valve with aortic stenosis underwent CT surgery 10/28/2021 with a 25 mm Inspiris Resilia valve aortic root enlargement surgical maze procedure and appendage occlusion and a 45 mm atrial clip placement  last seen 03/27/2022 maintaining sinus rhythm on amiodarone.  Postoperative ejection fraction normalized to 50 to 55%. Compliance with diet, lifestyle and medications: Yes  Initially intended to stop amiodarone if he is maintaining sinus rhythm He was unaware but he is in atrial tachycardia with 2 1 conduction 100 bpm We will increase his amiodarone to 400 mg/day and recheck an EKG in 1 week and I will get him back to see EP where this journey began a year ago for consideration of Watchman device.  Otherwise doing well no edema shortness of breath angina palpitation or syncope   Persistently mildly abnormal liver function test AST and ALT ALT are less than 1 and half times normal bilirubin is normal. 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  Advised to get better sleep, walk on a regular basis, decrease weight down to about 210 and maintain, and eliminate tobacco.   Excessive dental attrition 09/22/2021   HFrEF (heart failure with reduced ejection fraction) (HCC) 08/22/2020   History of maze procedure 03/31/2022   Hyperactive gag reflex 09/22/2021   Hyperlipidemia 10/12/2016   Impacted third molar tooth 09/22/2021   Incipient enamel caries 09/22/2021   Long term (current) use of anticoagulants 09/22/2021   Long-term insulin use (HCC) 10/12/2016   Morbid obesity (HCC) 10/12/2016   Patient advised to lose down to about 210 pounds and maintained.  Regular walking program recommended.  He knows how to lose weight.   Obesity, unspecified 10/12/2016   Patient advised to lose down to about 210 pounds and maintained.  Regular walking program recommended.  He knows how to lose weight.   Phobia of dental procedure 09/22/2021   Routine general medical examination at a health care facility 01/28/2021   S/P AVR (aortic valve replacement) and aortoplasty 10/28/2021   Sleep apnea    Teeth missing 09/22/2021   Thoracic aortic aneurysm without rupture, unspecified part (HCC) 03/31/2022   Type 2 diabetes mellitus with diabetic polyneuropathy (HCC) 10/12/2016    Past Surgical History:  Procedure Laterality Date   AORTIC ROOT ENLARGEMENT  10/28/2021   Procedure: AORTIC ROOT ENLARGEMENT WITH HEMASHIELD PLATINUM X 30CM;  Surgeon: Corliss Skains, MD;   Location: [facility] OR;  Service: Open Heart Surgery;;   AORTIC VALVE REPLACEMENT N/A 10/28/2021   Procedure: AORTIC VALVE REPLACEMENT (AVR) WITH INSPIRIS RESILIA AORTIC VALVE SIZE ;  Surgeon: Corliss Skains, MD;  Location: [facility] OR;  Service: Open Heart Surgery;  Laterality: N/A;   CARDIAC CATHETERIZATION     CARDIOVERSION N/A 09/24/2020   Procedure: CARDIOVERSION;  Surgeon: [name], MD;  Location: [facility] ENDOSCOPY;  Service: Cardiovascular;  Laterality: N/A;   CARDIOVERSION N/A 08/13/2021   Procedure: CARDIOVERSION;  Surgeon: [name], MD;  Location: [facility] ENDOSCOPY;  Service: Cardiovascular;  Laterality: N/A;   CARDIOVERSION N/A 02/26/2022   Procedure: CARDIOVERSION;  Surgeon: Thomasene Ripple, DO;  Location: [facility] ENDOSCOPY;  Service: Cardiovascular;  Laterality: N/A;   CCTA  08/2019   CLIPPING OF ATRIAL APPENDAGE N/A 10/28/2021   Procedure: CLIPPING OF ATRIAL APPENDAGE WITH ATRICLIP ACHV45;  Surgeon: Corliss Skains, MD;  Location: [facility] OR;  Service: Open Heart Surgery;  Laterality: N/A;   MAZE N/A 10/28/2021   Procedure: MAZE;  Surgeon: Corliss Skains, MD;  Location: [facility] OR;  Service: Open Heart Surgery;  Laterality: N/A;   RIGHT/LEFT HEART CATH AND  CORONARY ANGIOGRAPHY N/A 09/26/2021   Procedure: RIGHT/LEFT HEART CATH AND CORONARY ANGIOGRAPHY;  Surgeon: [place], Peter M, MD;  Location: [facility] INVASIVE CV LAB;  Service: Cardiovascular;  Laterality: N/A;   TEE WITHOUT CARDIOVERSION  09/24/2020   Procedure: TRANSESOPHAGEAL ECHOCARDIOGRAM (TEE);  Surgeon: [name], MD;  Location: [facility] ENDOSCOPY;  Service: Cardiovascular;;   TEE WITHOUT CARDIOVERSION N/A 10/28/2021   Procedure: TRANSESOPHAGEAL ECHOCARDIOGRAM (TEE);  Surgeon: Corliss Skains, MD;  Location: [facility] OR;  Service: Open Heart Surgery;  Laterality: N/A;    Current Medications: Current Meds  Medication Sig   albuterol (PROVENTIL) (2.5 MG/3ML) 0.083% nebulizer solution Take 3 mLs (2.5 mg total) by  nebulization every 6 (six) hours as needed for wheezing.   ALPRAZolam (XANAX) 0.5 MG tablet Take 1 tablet (0.5 mg total) by mouth daily as needed for anxiety.   amiodarone (PACERONE) 200 MG tablet Take 1 tablet (200 mg total) by mouth 2 (two) times daily.   ANORO ELLIPTA 62.5-25 MCG/ACT AEPB INHALE 1 PUFF INTO THE LUNGS DAILY AT 6 AM (Patient taking differently: Inhale 1 puff into the lungs daily.)   aspirin 81 MG EC tablet Take 1 tablet (81 mg total) by mouth daily. Swallow whole.   furosemide (LASIX) 20 MG tablet Take 1 tablet (20 mg total) by mouth daily.   Insulin Degludec 100 UNIT/ML SOLN Inject 62 Units into the skin daily.   liraglutide (VICTOZA) 18 MG/3ML SOPN ADMINISTER 1.2 MG UNDER THE SKIN DAILY (Patient taking differently: Inject 1.2 mg into the skin daily. ADMINISTER 1.2 MG UNDER THE SKIN DAILY)   losartan (COZAAR) 50 MG tablet Take 1 tablet (50 mg total) by mouth daily.   metoprolol tartrate (LOPRESSOR) 25 MG tablet Take 1 tablet (25 mg total) by mouth 2 (two) times daily.   Microlet Lancets MISC 1 each by Does not apply route daily in the afternoon.   potassium chloride SA (KLOR-CON M) 20 MEQ tablet Take 1 tablet (20 mEq total) by mouth 2 (two) times daily.   rosuvastatin (CRESTOR) 40 MG tablet Take 1 tablet (40 mg total) by mouth daily.   tadalafil (CIALIS) 5 MG tablet Take 1 tablet (5 mg total) by mouth daily as needed for erectile dysfunction.   zolpidem (AMBIEN) 10 MG tablet TAKE 1 TABLET(10 MG) BY MOUTH AT BEDTIME AS NEEDED FOR SLEEP (Patient taking differently: Take 10 mg by mouth at bedtime as needed for sleep.)   [DISCONTINUED] amiodarone (PACERONE) 200 MG tablet Take 1 tablet (200 mg total) by mouth daily.   [DISCONTINUED] metFORMIN (GLUCOPHAGE) 500 MG tablet Take 1 tablet (500 mg total) by mouth 2 (two) times daily with a meal. TAKE 1 TABLET(500 MG) BY MOUTH TWICE DAILY WITH A MEAL (Patient taking differently: Take 500 mg by mouth 2 (two) times daily with a meal.)      Allergies:   Patient has no known allergies.   Social History   Socioeconomic History   Marital status: Widowed    Spouse name: Not on file   Number of children: Not on file   Years of education: Not on file   Highest education level: Not on file  Occupational History   Not on file  Tobacco Use   Smoking status: Former    Passive exposure: Past   Smokeless tobacco: Former    Types: Snuff   Tobacco comments:    Former snuff 08/11/2021  Vaping Use   Vaping Use: Never used  Substance and Sexual Activity   Alcohol use: Not Currently  Drug use: Never   Sexual activity: Never  Other Topics Concern   Not on file  Social History Narrative   Not on file   Social Determinants of Health   Financial Resource Strain: Not on file  Food Insecurity: Not on file  Transportation Needs: Not on file  Physical Activity: Not on file  Stress: Not on file  Social Connections: Not on file     Family History: The patient's family history includes Alzheimer's disease in an other family member; Cancer in his maternal grandfather; Hypertension in an other family member; Liver disease in his sister; Memory loss in an other family member. ROS:   Please see the history of present illness.    All other systems reviewed and are negative.  EKGs/Labs/Other Studies Reviewed:    The following studies were reviewed today:  EKG:  EKG ordered today and personally reviewed.  The ekg ordered today demonstrates she has atrial tachycardia 2-1 conduction ventricular rate 100 bpm I reviewed the EKG with him  Recent Labs: 08/26/2021: NT-Pro BNP 1,398 10/30/2021: Magnesium 2.8 03/31/2022: Hemoglobin 15.1; Platelets 187 04/07/2022: TSH 1.360 06/09/2022: ALT 75; BUN 11; Creatinine, Ser 1.24; Potassium 3.4; Sodium 144  Recent Lipid Panel    Component Value Date/Time   CHOL 151 03/31/2022 1420   TRIG 170 (H) 03/31/2022 1420   HDL 42 03/31/2022 1420   CHOLHDL 3.6 03/31/2022 1420   LDLCALC 80 03/31/2022 1420     Physical Exam:    VS:  BP 110/80 (BP Location: Right Arm, Patient Position: Sitting)   Pulse 99   Ht 5\' 5"  (1.651 m)   Wt 208 lb 12.8 oz (94.7 kg)   SpO2 100%   BMI 34.75 kg/m     Wt Readings from Last 3 Encounters:  06/30/22 208 lb 12.8 oz (94.7 kg)  05/13/22 230 lb (104.3 kg)  03/31/22 230 lb (104.3 kg)     GEN:  Well nourished, well developed in no acute distress HEENT: Normal NECK: No JVD; No carotid bruits LYMPHATICS: No lymphadenopathy CARDIAC: RRR, no murmurs, rubs, gallops RESPIRATORY:  Clear to auscultation without rales, wheezing or rhonchi  ABDOMEN: Soft, non-tender, non-distended MUSCULOSKELETAL:  No edema; No deformity  SKIN: Warm and dry NEUROLOGIC:  Alert and oriented x 3 PSYCHIATRIC:  Normal affect    Signed, 05/31/22, MD  06/30/2022 3:35 PM    [facility]

## 2022-06-30 ENCOUNTER — Encounter: Payer: Self-pay | Admitting: Cardiology

## 2022-06-30 ENCOUNTER — Ambulatory Visit: Payer: Commercial Managed Care - HMO | Admitting: Cardiology

## 2022-06-30 VITALS — BP 110/80 | HR 99 | Ht 65.0 in | Wt 208.8 lb

## 2022-06-30 DIAGNOSIS — Z79899 Other long term (current) drug therapy: Secondary | ICD-10-CM

## 2022-06-30 DIAGNOSIS — I480 Paroxysmal atrial fibrillation: Secondary | ICD-10-CM | POA: Diagnosis not present

## 2022-06-30 DIAGNOSIS — I110 Hypertensive heart disease with heart failure: Secondary | ICD-10-CM

## 2022-06-30 DIAGNOSIS — I5042 Chronic combined systolic (congestive) and diastolic (congestive) heart failure: Secondary | ICD-10-CM | POA: Diagnosis not present

## 2022-06-30 DIAGNOSIS — Z952 Presence of prosthetic heart valve: Secondary | ICD-10-CM

## 2022-06-30 MED ORDER — TADALAFIL 5 MG PO TABS: 5.0000 mg | ORAL_TABLET | Freq: Every day | ORAL | 0 refills | Status: DC | PRN

## 2022-06-30 MED ORDER — AMIODARONE HCL 200 MG PO TABS: 200.0000 mg | ORAL_TABLET | Freq: Two times a day (BID) | ORAL | 3 refills | Status: DC

## 2022-06-30 NOTE — Patient Instructions (Signed)
Medication Instructions:  Your physician has recommended you make the following change in your medication:   START: Amiodarone 200 mg twice daily START: Cialis 5 mg daily as needed  *If you need a refill on your cardiac medications before your next appointment, please call your pharmacy*   Lab Work: None If you have labs (blood work) drawn today and your tests are completely normal, you will receive your results only by: MyChart Message (if you have MyChart) OR A paper copy in the mail If you have any lab test that is abnormal or we need to change your treatment, we will call you to review the results.   Testing/Procedures: None   Follow-Up: At [facility], you and your health needs are our priority.  As part of our continuing mission to provide you with exceptional heart care, we have created designated Provider Care Teams.  These Care Teams include your primary Cardiologist (physician) and Advanced Practice Providers (APPs -  Physician Assistants and Nurse Practitioners) who all work together to provide you with the care you need, when you need it.  We recommend signing up for the patient portal called "MyChart".  Sign up information is provided on this After Visit Summary.  MyChart is used to connect with patients for Virtual Visits (Telemedicine).  Patients are able to view lab/test results, encounter notes, upcoming appointments, etc.  Non-urgent messages can be sent to your provider as well.   To learn more about what you can do with MyChart, go to [email or web address].    Your next appointment:   3 month(s)  The format for your next appointment:   In Person  Provider:   Norman Herrlich, MD    Other Instructions See Dr. Lalla Brothers on follow up   Important Information About Sugar

## 2022-07-02 ENCOUNTER — Telehealth: Payer: Self-pay

## 2022-07-02 NOTE — Telephone Encounter (Signed)
PA submitted on CMM for Tadalafil 5 mg. Key [id number].  PA approved 07/02/22 until 07/02/23.

## 2022-07-07 ENCOUNTER — Telehealth: Payer: Self-pay

## 2022-07-07 ENCOUNTER — Encounter: Payer: Self-pay | Admitting: Legal Medicine

## 2022-07-07 ENCOUNTER — Ambulatory Visit (INDEPENDENT_AMBULATORY_CARE_PROVIDER_SITE_OTHER): Payer: Commercial Managed Care - HMO | Admitting: Legal Medicine

## 2022-07-07 ENCOUNTER — Ambulatory Visit (INDEPENDENT_AMBULATORY_CARE_PROVIDER_SITE_OTHER): Payer: Commercial Managed Care - HMO

## 2022-07-07 VITALS — BP 120/80 | HR 102 | Ht 65.0 in | Wt 206.0 lb

## 2022-07-07 VITALS — BP 120/90 | HR 99 | Temp 98.4°F | Resp 15 | Ht 65.0 in | Wt 207.0 lb

## 2022-07-07 DIAGNOSIS — E1159 Type 2 diabetes mellitus with other circulatory complications: Secondary | ICD-10-CM

## 2022-07-07 DIAGNOSIS — J449 Chronic obstructive pulmonary disease, unspecified: Secondary | ICD-10-CM

## 2022-07-07 DIAGNOSIS — E1142 Type 2 diabetes mellitus with diabetic polyneuropathy: Secondary | ICD-10-CM | POA: Diagnosis not present

## 2022-07-07 DIAGNOSIS — E119 Type 2 diabetes mellitus without complications: Secondary | ICD-10-CM | POA: Insufficient documentation

## 2022-07-07 DIAGNOSIS — I4891 Unspecified atrial fibrillation: Secondary | ICD-10-CM

## 2022-07-07 DIAGNOSIS — E782 Mixed hyperlipidemia: Secondary | ICD-10-CM | POA: Diagnosis not present

## 2022-07-07 DIAGNOSIS — I152 Hypertension secondary to endocrine disorders: Secondary | ICD-10-CM

## 2022-07-07 DIAGNOSIS — Z79899 Other long term (current) drug therapy: Secondary | ICD-10-CM

## 2022-07-07 DIAGNOSIS — E785 Hyperlipidemia, unspecified: Secondary | ICD-10-CM

## 2022-07-07 DIAGNOSIS — I480 Paroxysmal atrial fibrillation: Secondary | ICD-10-CM

## 2022-07-07 DIAGNOSIS — G4733 Obstructive sleep apnea (adult) (pediatric): Secondary | ICD-10-CM

## 2022-07-07 DIAGNOSIS — K7460 Unspecified cirrhosis of liver: Secondary | ICD-10-CM

## 2022-07-07 DIAGNOSIS — I4819 Other persistent atrial fibrillation: Secondary | ICD-10-CM | POA: Diagnosis not present

## 2022-07-07 DIAGNOSIS — I25118 Atherosclerotic heart disease of native coronary artery with other forms of angina pectoris: Secondary | ICD-10-CM

## 2022-07-07 DIAGNOSIS — I10 Essential (primary) hypertension: Secondary | ICD-10-CM

## 2022-07-07 DIAGNOSIS — I5032 Chronic diastolic (congestive) heart failure: Secondary | ICD-10-CM

## 2022-07-07 DIAGNOSIS — I5020 Unspecified systolic (congestive) heart failure: Secondary | ICD-10-CM

## 2022-07-07 DIAGNOSIS — E1169 Type 2 diabetes mellitus with other specified complication: Secondary | ICD-10-CM

## 2022-07-07 DIAGNOSIS — E669 Obesity, unspecified: Secondary | ICD-10-CM

## 2022-07-07 DIAGNOSIS — Z6834 Body mass index (BMI) 34.0-34.9, adult: Secondary | ICD-10-CM

## 2022-07-07 NOTE — Progress Notes (Signed)
Subjective:  Patient ID: Marcus Hicks, male    DOB: 06/09/1966  Age: 56 y.o. MRN: [id number]  Chief Complaint  Patient presents with   Diabetes   Congestive Heart Failure    HPI: chronic  Patient present with type 2 diabetes.  Specifically, this is type 2, insulin requiring diabetes, complicated by atrial fibrillation, CHF.  Compliance with treatment has been good; patient take medicines as directed, maintains diet and exercise regimen, follows up as directed, and is keeping glucose diary.  Date of  diagnosis 2010.  Depression screen has been performed.Tobacco screen nonsmoker. Current medicines for diabetes insulin deglutic 62 units qd, liraglutide 1.2.  Patient is on losartan for renal protection and rosuvastatin for cholesterol control.  Patient performs foot exams daily and last ophthalmologic exam was needs eye exam. A1c last 8.1. He has stopped carbonated beverage. Stopped metformin due to liver disease.  Patient presents with hyperlipidemia.  Compliance with treatment has been good; patient takes medicines as directed, maintains low cholesterol diet, follows up as directed, and maintains exercise regimen.  Patient is using rosuvastatin without problems.   Patient presents with HFpEF  that is stable. Diagnosis made 2022.  The course of the disease is stable.  Current medicines include metoprolol, amiodarone, furosemide. Patient follows a low cholesterol diet and maintains a weight diary.  Patient is on low salt, low cholesterol diet and avoids alcohol.  Patient denies adverse effects of medicines. Patient is monitoring weight and has no changes of weight.  Patient is having no pedal edema, no PND and no PND.  Patient is continuing to see cardiology.   Patient presents with diagnosis of COPD.  It is not secondary to prolonged asthma.  Diagnosis years  Treatment includes albuterol.  The diagnosis has not been hospitalized for this diagnosis. Last na.  Patient is compliant with regular  use of medicines.   Depression/grief on alprazolam. Long discussion on how he is coping with loss of wife.   Current Outpatient Medications on File Prior to Visit  Medication Sig Dispense Refill   albuterol (PROVENTIL) (2.5 MG/3ML) 0.083% nebulizer solution Take 3 mLs (2.5 mg total) by nebulization every 6 (six) hours as needed for wheezing.     ALPRAZolam (XANAX) 0.5 MG tablet Take 1 tablet (0.5 mg total) by mouth daily as needed for anxiety. 30 tablet 0   amiodarone (PACERONE) 200 MG tablet Take 1 tablet (200 mg total) by mouth 2 (two) times daily. 180 tablet 3   ANORO ELLIPTA 62.5-25 MCG/ACT AEPB INHALE 1 PUFF INTO THE LUNGS DAILY AT 6 AM (Patient taking differently: Inhale 1 puff into the lungs daily.) 60 each 2   aspirin 81 MG EC tablet Take 1 tablet (81 mg total) by mouth daily. Swallow whole. 90 tablet 3   BD PEN NEEDLE NANO 2ND GEN 32G X 4 MM MISC daily.     furosemide (LASIX) 20 MG tablet Take 1 tablet (20 mg total) by mouth daily. 90 tablet 3   Insulin Degludec 100 UNIT/ML SOLN Inject 62 Units into the skin daily.     liraglutide (VICTOZA) 18 MG/3ML SOPN ADMINISTER 1.2 MG UNDER THE SKIN DAILY (Patient taking differently: Inject 1.2 mg into the skin daily. ADMINISTER 1.2 MG UNDER THE SKIN DAILY) 12 mL 3   losartan (COZAAR) 50 MG tablet Take 1 tablet (50 mg total) by mouth daily. 90 tablet 2   metoprolol tartrate (LOPRESSOR) 25 MG tablet Take 1 tablet (25 mg total) by mouth 2 (two) times daily. 180  tablet 2   Microlet Lancets MISC 1 each by Does not apply route daily in the afternoon. 100 each 2   potassium chloride SA (KLOR-CON M) 20 MEQ tablet Take 1 tablet (20 mEq total) by mouth 2 (two) times daily. 180 tablet 3   rosuvastatin (CRESTOR) 40 MG tablet Take 1 tablet (40 mg total) by mouth daily. 90 tablet 3   tadalafil (CIALIS) 5 MG tablet Take 1 tablet (5 mg total) by mouth daily as needed for erectile dysfunction. 10 tablet 0   zolpidem (AMBIEN) 10 MG tablet TAKE 1 TABLET(10 MG) BY  MOUTH AT BEDTIME AS NEEDED FOR SLEEP (Patient taking differently: Take 10 mg by mouth at bedtime as needed for sleep.) 90 tablet 0   No current facility-administered medications on file prior to visit.   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  Advised to get better sleep, walk on a regular basis, decrease weight down to about 210 and maintain, and eliminate tobacco.   Excessive dental attrition 09/22/2021   HFrEF (heart failure with reduced ejection fraction) (HCC) 08/22/2020   History of maze procedure 03/31/2022   Hyperactive gag reflex 09/22/2021   Hyperlipidemia 10/12/2016   Impacted third molar tooth 09/22/2021   Incipient enamel caries 09/22/2021   Long term (current) use of anticoagulants 09/22/2021   Long-term insulin use (HCC) 10/12/2016   Morbid obesity (HCC) 10/12/2016   Patient advised to lose down to about 210 pounds and maintained.  Regular walking program recommended.  He  knows how to lose weight.   Obesity, unspecified 10/12/2016   Patient advised to lose down to about 210 pounds and maintained.  Regular walking program recommended.  He knows how to lose weight.   Phobia of dental procedure 09/22/2021   Routine general medical examination at a health care facility 01/28/2021   S/P AVR (aortic valve replacement) and aortoplasty 10/28/2021   Sleep apnea    Teeth missing 09/22/2021   Thoracic aortic aneurysm without rupture, unspecified part (HCC) 03/31/2022   Type 2 diabetes mellitus with diabetic polyneuropathy (HCC) 10/12/2016   Past Surgical History:  Procedure Laterality Date   AORTIC ROOT ENLARGEMENT  10/28/2021   Procedure: AORTIC ROOT ENLARGEMENT WITH HEMASHIELD PLATINUM X 30CM;  Surgeon: Corliss Skains, MD;  Location: [facility] OR;  Service: Open Heart Surgery;;   AORTIC VALVE REPLACEMENT N/A 10/28/2021   Procedure: AORTIC VALVE REPLACEMENT (AVR) WITH INSPIRIS RESILIA AORTIC VALVE SIZE ;  Surgeon: Corliss Skains, MD;  Location: [facility] OR;  Service: Open Heart Surgery;  Laterality: N/A;   CARDIAC CATHETERIZATION     CARDIOVERSION N/A 09/24/2020   Procedure: CARDIOVERSION;  Surgeon: Little IshikawaSchumann, Christopher L, MD;  [facility];  Service: Cardiovascular;  Laterality: N/A;   CARDIOVERSION N/A 08/13/2021   Procedure: CARDIOVERSION;  Surgeon: Parke PoissonAcharya, Gayatri A, MD;  [facility];  Service: Cardiovascular;  Laterality: N/A;   CARDIOVERSION N/A 02/26/2022   Procedure: CARDIOVERSION;  Surgeon: Thomasene Rippleobb, Kardie, DO;  Location: [facility] ENDOSCOPY;  Service: Cardiovascular;  Laterality: N/A;   CCTA  08/2019   CLIPPING OF ATRIAL APPENDAGE N/A 10/28/2021   Procedure: CLIPPING OF ATRIAL APPENDAGE WITH ATRICLIP ACHV45;  Surgeon: Corliss SkainsLightfoot, Harrell O, MD;  Location: [facility] OR;  Service: Open Heart Surgery;  Laterality: N/A;   MAZE N/A 10/28/2021   Procedure: MAZE;  Surgeon: Corliss SkainsLightfoot, Harrell O, MD;  Location: [facility] OR;  Service: Open Heart Surgery;   Laterality: N/A;   RIGHT/LEFT HEART CATH AND CORONARY ANGIOGRAPHY N/A 09/26/2021   Procedure: RIGHT/LEFT HEART CATH AND CORONARY ANGIOGRAPHY;  Surgeon: SwazilandJordan, Peter M, MD;  [facility];  Service: Cardiovascular;  Laterality: N/A;   TEE WITHOUT CARDIOVERSION  09/24/2020   Procedure: TRANSESOPHAGEAL ECHOCARDIOGRAM (TEE);  Surgeon: Little IshikawaSchumann, Christopher L, MD;  [facility];  Service: Cardiovascular;;   TEE WITHOUT CARDIOVERSION N/A 10/28/2021   Procedure: TRANSESOPHAGEAL ECHOCARDIOGRAM (TEE);  Surgeon: Corliss SkainsLightfoot, Harrell O, MD;  [facility];  Service: Open Heart Surgery;  Laterality: N/A;    Family History  Problem Relation Age of Onset   Cancer Maternal Grandfather    Liver disease Sister    Alzheimer's disease Other    Memory loss Other    Hypertension Other    Social History   Socioeconomic History   Marital status: Widowed    Spouse name: Not on file   Number of children: Not on file   Years of education: Not on file   Highest education level: Not on file  Occupational History   Not on file  Tobacco Use   Smoking status: Former    Passive exposure: Past   Smokeless tobacco: Former    Types: Snuff   Tobacco comments:    Former snuff 08/11/2021  Vaping Use   Vaping Use: Never used  Substance and Sexual Activity   Alcohol use: Not Currently   Drug use: Never   Sexual activity: Never  Other Topics Concern   Not on file  Social History Narrative   Not on file   Social Determinants of Health   Financial Resource Strain: Not on file  Food Insecurity: Not on file  Transportation Needs: Not on file  Physical Activity: Not on file  Stress: Not on file  Social Connections: Not on file    Review of Systems  Constitutional:  Negative for chills, fatigue, fever and unexpected weight change.  HENT:  Negative for congestion, ear pain, sinus pain and sore throat.   Eyes:  Negative for visual disturbance.  Respiratory:  Negative for cough and  shortness of breath.   Cardiovascular:  Negative for chest pain and palpitations.  Gastrointestinal:  Negative for abdominal pain, blood in stool, constipation, diarrhea, nausea and vomiting.  Endocrine: Negative for polydipsia.  Genitourinary:  Negative for dysuria.  Musculoskeletal:  Negative for back pain.  Skin:  Negative for rash.  Neurological:  Negative for headaches.     Objective:  BP (!) 120/90   Pulse 99   Temp 98.4 F (36.9 C)   Resp 15   Ht  5\' 5"  (1.651 m)   Wt 207 lb (93.9 kg)   SpO2 97%   BMI 34.45 kg/m      07/07/2022    3:20 PM 07/07/2022    2:05 PM 06/30/2022    3:00 PM  BP/Weight  Systolic BP 120 120 110  Diastolic BP 80 90 80  Wt. (Lbs) 206 207 208.8  BMI 34.28 kg/m2 34.45 kg/m2 34.75 kg/m2    Physical Exam Constitutional:      General: He is not in acute distress.    Appearance: Normal appearance. He is obese.  HENT:     Head: Normocephalic.     Right Ear: Tympanic membrane normal.     Left Ear: Tympanic membrane normal.     Nose: Nose normal.     Mouth/Throat:     Mouth: Mucous membranes are moist.     Pharynx: Oropharynx is clear.  Eyes:     Extraocular Movements: Extraocular movements intact.     Conjunctiva/sclera: Conjunctivae normal.     Pupils: Pupils are equal, round, and reactive to light.  Cardiovascular:     Rate and Rhythm: Normal rate and regular rhythm.     Pulses: Normal pulses.     Heart sounds: Normal heart sounds. No murmur heard.    No gallop.  Pulmonary:     Effort: Pulmonary effort is normal. No respiratory distress.     Breath sounds: Normal breath sounds. No wheezing.  Abdominal:     General: Bowel sounds are normal. There is no distension.     Palpations: Abdomen is soft. There is no mass.     Tenderness: There is no abdominal tenderness.  Musculoskeletal:        General: Normal range of motion.     Cervical back: Normal range of motion and neck supple.     Right lower leg: No edema.     Left lower leg: No  edema.  Skin:    General: Skin is warm.     Capillary Refill: Capillary refill takes less than 2 seconds.  Neurological:     General: No focal deficit present.     Mental Status: He is alert and oriented to person, place, and time. Mental status is at baseline.     Gait: Gait normal.  Psychiatric:        Mood and Affect: Mood normal.        Behavior: Behavior normal.        Thought Content: Thought content normal.     Diabetic Foot Exam - Simple   Simple Foot Form Diabetic Foot exam was performed with the following findings: Yes 07/07/2022  2:52 PM  Visual Inspection No deformities, no ulcerations, no other skin breakdown bilaterally: Yes Sensation Testing See comments: Yes Pulse Check Posterior Tibialis and Dorsalis pulse intact bilaterally: Yes Comments Decrease in sensation      Lab Results  Component Value Date   WBC 6.0 03/31/2022   HGB 15.1 03/31/2022   HCT 43.7 03/31/2022   PLT 187 03/31/2022   GLUCOSE 78 06/09/2022   CHOL 151 03/31/2022   TRIG 170 (H) 03/31/2022   HDL 42 03/31/2022   LDLCALC 80 03/31/2022   ALT 75 (H) 06/09/2022   AST 55 (H) 06/09/2022   NA 144 06/09/2022   K 3.4 (L) 06/09/2022   CL 102 06/09/2022   CREATININE 1.24 06/09/2022   BUN 11 06/09/2022   CO2 28 06/09/2022   TSH 1.360 04/07/2022   INR 3.3 (A) 03/24/2022  HGBA1C 8.1 (H) 03/31/2022   MICROALBUR 30 04/09/2021      Assessment & Plan:   Problem List Items Addressed This Visit       Cardiovascular and Mediastinum   Essential hypertension - Primary   Relevant Orders   CBC with Differential/Platelet   Comprehensive metabolic panel An individual hypertension care plan was established and reinforced today.  The patient's status was assessed using clinical findings on exam and labs or diagnostic tests. The patient's success at meeting treatment goals on disease specific evidence-based guidelines and found to be well controlled. SELF MANAGEMENT: The patient and I together  assessed ways to personally work towards obtaining the recommended goals. RECOMMENDATIONS: avoid decongestants found in common cold remedies, decrease consumption of alcohol, perform routine monitoring of BP with home BP cuff, exercise, reduction of dietary salt, take medicines as prescribed, try not to miss doses and quit smoking.  Regular exercise and maintaining a healthy weight is needed.  Stress reduction may help. A CLINICAL SUMMARY including written plan identify barriers to care unique to individual due to social or financial issues.  We attempt to mutually creat solutions for individual and family understanding.     HFrEF (heart failure with reduced ejection fraction) (HCC) An individualized care plan was established and reinforced.  The patient's disease status was assessed using clinical finding son exam today, labs, and/or other diagnostic testing such as x-rays, to determine the patient's success in meeting treatmentgoalsbased on disease-based guidelines and found to beimproving. But not at goal yet. Medications prescriptions no changes Laboratory tests ordered to be performed today include routine. RECOMMENDATIONS: given include see cardiology.  Call physician is patient gains 3 lbs in one day or 5 lbs for one week.  Call for progressive PND, orthopnea or increased pedal edema.     Dysrhythmia patient has history of PVCs but stable now    Coronary artery disease An individual plan was formulated based on patient history and exam, labs and evidence based data. Patient has not had recent angina or nitroglycerin use. continue present treatment.        Atrial fibrillation with rapid ventricular response [facility]) Patient has a diagnosis of chronic atrial fibrillation.   Patient is on no anticoagulant and has conrolled ventricular response.  Patient is CV stable.     Obesity, diabetes, and hypertension syndrome (HCC) An individual care plan for diabetes was established and reinforced today.   The patient's status was assessed using clinical findings on exam, labs and diagnostic testing. Patient success at meeting goals based on disease specific evidence-based guidelines and found to be fair controlled.he has stopped carbonated beverages Medications were assessed and patient's understanding of the medical issues , including barriers were assessed. Recommend adherence to a diabetic diet, a graduated exercise program, HgbA1c level is checked quarterly, and urine microalbumin performed yearly .  Annual mono-filament sensation testing performed. Lower blood pressure and control hyperlipidemia is important. Get annual eye exams and annual flu shots and smoking cessation discussed.  Self management goals were discussed.      Respiratory   COPD (chronic obstructive pulmonary disease) (HCC) An individualize plan was formulated for care of COPD.  Treatment is evidence based.  She will continue on inhalers, avoid smoking and smoke.  Regular exercise with help with dyspnea. Routine follow ups and medication compliance is needed.     Sleep apnea Using CPAP consistently every night and medically benefiting from its use.      Digestive   Cirrhosis of liver without  ascites, unspecified hepatic cirrhosis type [facility]) Patient has chronic hepatitis C and sees GI     Endocrine   Type 2 diabetes mellitus with diabetic polyneuropathy (HCC)   Relevant Orders   Hemoglobin A1c An individual care plan for diabetes was established and reinforced today.  The patient's status was assessed using clinical findings on exam, labs and diagnostic testing. Patient success at meeting goals based on disease specific evidence-based guidelines and found to be fair controlled. Medications were assessed and patient's understanding of the medical issues , including barriers were assessed. Recommend adherence to a diabetic diet, a graduated exercise program, HgbA1c level is checked quarterly, and urine microalbumin performed  yearly .  Annual mono-filament sensation testing performed. Lower blood pressure and control hyperlipidemia is important. Get annual eye exams and annual flu shots and smoking cessation discussed.  Self management goals were discussed.      Other   Dyslipidemia   Relevant Orders   Lipid panel AN INDIVIDUAL CARE PLAN for hyperlipidemia/ cholesterol was established and reinforced today.  The patient's status was assessed using clinical findings on exam, lab and other diagnostic tests. The patient's disease status was assessed based on evidence-based guidelines and found to be fair controlled. MEDICATIONS were reviewed. SELF MANAGEMENT GOALS have been discussed and patient's success at attaining the goal of low cholesterol was assessed. RECOMMENDATION given include regular exercise 3 days a week and low cholesterol/low fat diet. CLINICAL SUMMARY including written plan to identify barriers unique to the patient due to social or economic  reasons was discussed.        BMI 34.0-34.9,adult An individualize plan was formulated for obesity using patient history and physical exam to encourage weight loss.  An evidence based program was formulated.  Patient is to cut portion size with meals and to plan physical exercise 3 days a week at least 20 minutes.  Weight watchers and other programs are helpful.  Planned amount of weight loss 10 lbs.   . A total of 45 minutes were spent face-to-face with the patient during this encounter and over half of that time was spent on counseling and coordination of care.  Patient discussed his difficulties adjusting to deceased wife.   Orders Placed This Encounter  Procedures   CBC with Differential/Platelet   Comprehensive metabolic panel   Hemoglobin A1c   Lipid panel     Follow-up: Return in about 4 months (around 11/06/2022).  An After Visit Summary was printed and given to the patient.  Brent Bulla, MD [facility] [phone]

## 2022-07-07 NOTE — Progress Notes (Signed)
   Nurse Visit   Date of Encounter: 07/07/2022 ID: Marcus Hicks, DOB 07-Sep-1966, MRN [id number]  PCP:  Abigail Miyamoto, MD   [facility] Providers Cardiologist:  None Electrophysiologist:  Lanier Prude, MD      Visit Details   VS:  BP 120/80 (BP Location: Left Arm, Patient Position: Sitting, Cuff Size: Normal)   Pulse (!) 102   Ht 5\' 5"  (1.651 m)   Wt 206 lb (93.4 kg)   SpO2 97%   BMI 34.28 kg/m  , BMI Body mass index is 34.28 kg/m.  Wt Readings from Last 3 Encounters:  07/07/22 206 lb (93.4 kg)  07/07/22 207 lb (93.9 kg)  06/30/22 208 lb 12.8 oz (94.7 kg)     Reason for visit: Amiodarone follow up- EKG. Pt reported taking 200mg  in the AM and 200mg  in the PM Performed today: Vitals, EKG, Provider consulted:Dr. 08/30/22, and Education Changes (medications, testing, etc.) : Advised to consult Dr. regarding Amiodarone dose and follow up. Note sent.  Length of Visit: 15 minutes    Medications Adjustments/Labs and Tests Ordered: No orders of the defined types were placed in this encounter.  No orders of the defined types were placed in this encounter.    Signed, , RN  07/07/2022 3:55 PM

## 2022-07-07 NOTE — Telephone Encounter (Signed)
Pt was in for Nurse visit to follow up in Amiodarone. EKG completed and scanned. Dr. Bing Matter asked that you look at the EKG and make recommendations on his dose and follow please. Pt is doing good and not having any problems with the medication. He will see EP in October. I advised pt that I would call him when you had time to review the EKG. Thank you.

## 2022-07-07 NOTE — Telephone Encounter (Signed)
Spoke with pt and advised to continue Amiodarone 200mg  BID for 2 weeks then 200mg  q d after per Dr. . Pt agreed and verbalized understanding.

## 2022-07-08 LAB — CBC WITH DIFFERENTIAL/PLATELET
Basophils Absolute: 0.1 10*3/uL (ref 0.0–0.2)
Basos: 1 %
EOS (ABSOLUTE): 0.1 10*3/uL (ref 0.0–0.4)
Eos: 1 %
Hematocrit: 52.5 % — ABNORMAL HIGH (ref 37.5–51.0)
Hemoglobin: 17.2 g/dL (ref 13.0–17.7)
Immature Grans (Abs): 0 10*3/uL (ref 0.0–0.1)
Immature Granulocytes: 0 %
Lymphocytes Absolute: 1.1 10*3/uL (ref 0.7–3.1)
Lymphs: 16 %
MCH: 29.6 pg (ref 26.6–33.0)
MCHC: 32.8 g/dL (ref 31.5–35.7)
MCV: 90 fL (ref 79–97)
Monocytes Absolute: 0.5 10*3/uL (ref 0.1–0.9)
Monocytes: 8 %
Neutrophils Absolute: 5.3 10*3/uL (ref 1.4–7.0)
Neutrophils: 74 %
Platelets: 178 10*3/uL (ref 150–450)
RBC: 5.81 x10E6/uL — ABNORMAL HIGH (ref 4.14–5.80)
RDW: 13.1 % (ref 11.6–15.4)
WBC: 7 10*3/uL (ref 3.4–10.8)

## 2022-07-08 LAB — COMPREHENSIVE METABOLIC PANEL
ALT: 47 IU/L — ABNORMAL HIGH (ref 0–44)
AST: 34 IU/L (ref 0–40)
Albumin/Globulin Ratio: 2 (ref 1.2–2.2)
Albumin: 5.2 g/dL — ABNORMAL HIGH (ref 3.8–4.9)
Alkaline Phosphatase: 78 IU/L (ref 44–121)
BUN/Creatinine Ratio: 5 — ABNORMAL LOW (ref 9–20)
BUN: 10 mg/dL (ref 6–24)
Bilirubin Total: 0.6 mg/dL (ref 0.0–1.2)
CO2: 27 mmol/L (ref 20–29)
Calcium: 10.3 mg/dL — ABNORMAL HIGH (ref 8.7–10.2)
Chloride: 103 mmol/L (ref 96–106)
Creatinine, Ser: 1.97 mg/dL — ABNORMAL HIGH (ref 0.76–1.27)
Globulin, Total: 2.6 g/dL (ref 1.5–4.5)
Glucose: 109 mg/dL — ABNORMAL HIGH (ref 70–99)
Potassium: 4.2 mmol/L (ref 3.5–5.2)
Sodium: 148 mmol/L — ABNORMAL HIGH (ref 134–144)
Total Protein: 7.8 g/dL (ref 6.0–8.5)
eGFR: 39 mL/min/{1.73_m2} — ABNORMAL LOW (ref >59)

## 2022-07-08 LAB — LIPID PANEL
Chol/HDL Ratio: 2.9 ratio (ref 0.0–5.0)
Cholesterol, Total: 120 mg/dL (ref 100–199)
HDL: 42 mg/dL
LDL Chol Calc (NIH): 57 mg/dL (ref 0–99)
Triglycerides: 115 mg/dL (ref 0–149)
VLDL Cholesterol Cal: 21 mg/dL (ref 5–40)

## 2022-07-08 LAB — HEMOGLOBIN A1C
Est. average glucose Bld gHb Est-mCnc: 131 mg/dL
Hgb A1c MFr Bld: 6.2 % — ABNORMAL HIGH (ref 4.8–5.6)

## 2022-07-08 LAB — CARDIOVASCULAR RISK ASSESSMENT

## 2022-07-08 NOTE — Progress Notes (Signed)
Hematocrit high, how is oxygen, any testosterone?, glucose 109, kidney stage 3b, calcium high, any calcium supplements?, one liver tests elevated, A1c 6.2 great, cholesterol normal,  lp

## 2022-07-09 ENCOUNTER — Other Ambulatory Visit: Payer: Self-pay

## 2022-07-09 DIAGNOSIS — R718 Other abnormality of red blood cells: Secondary | ICD-10-CM

## 2022-08-04 ENCOUNTER — Telehealth: Payer: Self-pay

## 2022-08-04 ENCOUNTER — Other Ambulatory Visit: Payer: Self-pay

## 2022-08-04 MED ORDER — TRULICITY 1.5 MG/0.5ML ~~LOC~~ SOAJ: 1.5000 mg | SUBCUTANEOUS | 2 refills | Status: DC

## 2022-08-04 NOTE — Telephone Encounter (Signed)
Patient changed insurance and new insurance is asking for Prior Authorization for victoza and [place]. Insurance asking to try Trulicity before to approve Victoza. Patient is willing to try Trulicity. It was sent to pharmacy and patient will come back in one month for follow up of new medication. He ran out of victoza one month ago and he has been working on his diet. Last A1C was 6.2%. Appointment was done.

## 2022-08-06 ENCOUNTER — Other Ambulatory Visit: Payer: Self-pay | Admitting: Legal Medicine

## 2022-08-12 ENCOUNTER — Other Ambulatory Visit: Payer: Self-pay | Admitting: Cardiology

## 2022-08-13 ENCOUNTER — Other Ambulatory Visit: Payer: Commercial Managed Care - HMO

## 2022-08-17 ENCOUNTER — Other Ambulatory Visit: Payer: Commercial Managed Care - HMO

## 2022-08-17 DIAGNOSIS — R718 Other abnormality of red blood cells: Secondary | ICD-10-CM

## 2022-08-17 LAB — CBC WITH DIFFERENTIAL/PLATELET
Basophils Absolute: 0 10*3/uL (ref 0.0–0.2)
Basos: 1 %
EOS (ABSOLUTE): 0.2 10*3/uL (ref 0.0–0.4)
Eos: 2 %
Hematocrit: 48.2 % (ref 37.5–51.0)
Hemoglobin: 15.3 g/dL (ref 13.0–17.7)
Immature Grans (Abs): 0 10*3/uL (ref 0.0–0.1)
Immature Granulocytes: 0 %
Lymphocytes Absolute: 1.1 10*3/uL (ref 0.7–3.1)
Lymphs: 17 %
MCH: 28.9 pg (ref 26.6–33.0)
MCHC: 31.7 g/dL (ref 31.5–35.7)
MCV: 91 fL (ref 79–97)
Monocytes Absolute: 0.5 10*3/uL (ref 0.1–0.9)
Monocytes: 8 %
Neutrophils Absolute: 4.5 10*3/uL (ref 1.4–7.0)
Neutrophils: 72 %
Platelets: 172 10*3/uL (ref 150–450)
RBC: 5.3 x10E6/uL (ref 4.14–5.80)
RDW: 12.8 % (ref 11.6–15.4)
WBC: 6.2 10*3/uL (ref 3.4–10.8)

## 2022-08-18 NOTE — Progress Notes (Signed)
CBC normal,  lp

## 2022-08-20 ENCOUNTER — Other Ambulatory Visit: Payer: Self-pay | Admitting: Family Medicine

## 2022-08-20 ENCOUNTER — Other Ambulatory Visit: Payer: Self-pay | Admitting: Legal Medicine

## 2022-08-20 DIAGNOSIS — E1142 Type 2 diabetes mellitus with diabetic polyneuropathy: Secondary | ICD-10-CM

## 2022-08-31 ENCOUNTER — Encounter: Payer: Self-pay | Admitting: Cardiology

## 2022-08-31 ENCOUNTER — Other Ambulatory Visit: Payer: Self-pay | Admitting: Legal Medicine

## 2022-08-31 ENCOUNTER — Ambulatory Visit: Payer: Commercial Managed Care - HMO | Attending: Cardiology | Admitting: Cardiology

## 2022-08-31 VITALS — BP 140/78 | HR 98 | Ht 65.0 in | Wt 220.0 lb

## 2022-08-31 DIAGNOSIS — D6869 Other thrombophilia: Secondary | ICD-10-CM | POA: Diagnosis not present

## 2022-08-31 DIAGNOSIS — E785 Hyperlipidemia, unspecified: Secondary | ICD-10-CM

## 2022-08-31 DIAGNOSIS — I484 Atypical atrial flutter: Secondary | ICD-10-CM

## 2022-08-31 NOTE — Progress Notes (Signed)
Electrophysiology Office Note   Date:  08/31/2022   ID:  Marcus, Hicks 1966-04-08, MRN [id number]  PCP:  Abigail Miyamoto, MD  Cardiologist:  Dulce Sellar Primary Electrophysiologist:  Geeta Dworkin Jorja Loa, MD    Chief Complaint: AF   History of Present Illness: Marcus Hicks is a 56 y.o. male who is being seen today for the evaluation of AF at the request of Dulce Sellar, Iline Oven, MD. Presenting today for electrophysiology evaluation.  He has a history significant for persistent atrial fibrillation, heart failure with reduced ejection fraction, mild coronary artery disease, cirrhosis, thoracic aortic aneurysm with bicuspid aortic valve.  He had aortic valve replacement, surgical maze and left atrial appendage occlusion 10/28/2021.  His ejection fraction normalized to 50 to 55%.  He presented to his general cardiologist in 2-1 atrial flutter versus atrial tachycardia.  His amiodarone was increased.  Today, he denies symptoms of palpitations, chest pain, shortness of breath, orthopnea, PND, lower extremity edema, claudication, dizziness, presyncope, syncope, bleeding, or neurologic sequela. The patient is tolerating medications without difficulties.    Past Medical History:  Diagnosis Date   Accretions on teeth 09/22/2021   Acquired thrombophilia (HCC) 08/11/2021   Acute kidney injury (HCC) 06/01/2021   Atrial fibrillation with rapid ventricular response (HCC) 08/12/2020   Bicuspid aortic valve    BMI 38.0-38.9,adult 11/26/2020   Bruxism (teeth grinding) 09/22/2021   Caries 09/22/2021   CHF (congestive heart failure) (HCC)    Chronic hepatitis C virus infection (HCC) 08/03/2013   Chronic periodontitis 09/22/2021   Cirrhosis of liver without ascites, unspecified hepatic cirrhosis type (HCC) 12/25/2021   Clinical xerostomia 09/22/2021   COPD (chronic obstructive pulmonary disease) (HCC)    Coronary artery disease    COVID-19 virus infection 06/01/2021   Diabetes mellitus  type 2, insulin dependent (HCC)    Diastema 09/22/2021   Dyslipidemia 07/03/2015   Dysrhythmia    A Fib   Encounter for follow-up for aortic valve replacement 12/25/2021   Encounter for preoperative dental examination 09/22/2021   Essential hypertension 07/03/2015   Last Assessment & Plan:  Blood pressure is borderline high.  Advised to get better sleep, walk on a regular basis, decrease weight down to about 210 and maintain, and eliminate tobacco.   Excessive dental attrition 09/22/2021   HFrEF (heart failure with reduced ejection fraction) (HCC) 08/22/2020   History of maze procedure 03/31/2022   Hyperactive gag reflex 09/22/2021   Hyperlipidemia 10/12/2016   Impacted third molar tooth 09/22/2021   Incipient enamel caries 09/22/2021   Long term (current) use of anticoagulants 09/22/2021   Long-term insulin use (HCC) 10/12/2016   Morbid obesity (HCC) 10/12/2016   Patient advised to lose down to about 210 pounds and maintained.  Regular walking program recommended.  He knows how to lose weight.   Obesity, unspecified 10/12/2016   Patient advised to lose down to about 210 pounds and maintained.  Regular walking program recommended.  He knows how to lose weight.   Phobia of dental procedure 09/22/2021   Routine general medical examination at a health care facility 01/28/2021   S/P AVR (aortic valve replacement) and aortoplasty 10/28/2021   Sleep apnea    Teeth missing 09/22/2021   Thoracic aortic aneurysm without rupture, unspecified part (HCC) 03/31/2022   Type 2 diabetes mellitus with diabetic polyneuropathy (HCC) 10/12/2016   Past Surgical History:  Procedure Laterality Date   AORTIC ROOT ENLARGEMENT  10/28/2021   Procedure: AORTIC ROOT ENLARGEMENT WITH HEMASHIELD PLATINUM X 30CM;  Surgeon: Corliss Skains, MD;  Location: [facility] OR;  Service: Open Heart Surgery;;   AORTIC VALVE REPLACEMENT N/A 10/28/2021   Procedure: AORTIC VALVE REPLACEMENT (AVR) WITH INSPIRIS RESILIA AORTIC VALVE  SIZE ;  Surgeon: Corliss Skains, MD;  Location: [facility] OR;  Service: Open Heart Surgery;  Laterality: N/A;   CARDIAC CATHETERIZATION     CARDIOVERSION N/A 09/24/2020   Procedure: CARDIOVERSION;  Surgeon: [name], MD;  Location: [facility] ENDOSCOPY;  Service: Cardiovascular;  Laterality: N/A;   CARDIOVERSION N/A 08/13/2021   Procedure: CARDIOVERSION;  Surgeon: [name], MD;  Location: [facility] ENDOSCOPY;  Service: Cardiovascular;  Laterality: N/A;   CARDIOVERSION N/A 02/26/2022   Procedure: CARDIOVERSION;  Surgeon: Thomasene Ripple, DO;  Location: [facility] ENDOSCOPY;  Service: Cardiovascular;  Laterality: N/A;   CCTA  08/2019   CLIPPING OF ATRIAL APPENDAGE N/A 10/28/2021   Procedure: CLIPPING OF ATRIAL APPENDAGE WITH ATRICLIP ACHV45;  Surgeon: Corliss Skains, MD;  Location: [facility] OR;  Service: Open Heart Surgery;  Laterality: N/A;   MAZE N/A 10/28/2021   Procedure: MAZE;  Surgeon: Corliss Skains, MD;  Location: [facility] OR;  Service: Open Heart Surgery;  Laterality: N/A;   RIGHT/LEFT HEART CATH AND CORONARY ANGIOGRAPHY N/A 09/26/2021   Procedure: RIGHT/LEFT HEART CATH AND CORONARY ANGIOGRAPHY;  Surgeon: Swazil[place], Peter M, MD;  Location: [facility] INVASIVE CV LAB;  Service: Cardiovascular;  Laterality: N/A;   TEE WITHOUT CARDIOVERSION  09/24/2020   Procedure: TRANSESOPHAGEAL ECHOCARDIOGRAM (TEE);  Surgeon: [name], MD;  Location: [facility] ENDOSCOPY;  Service: Cardiovascular;;   TEE WITHOUT CARDIOVERSION N/A 10/28/2021   Procedure: TRANSESOPHAGEAL ECHOCARDIOGRAM (TEE);  Surgeon: Corliss Skains, MD;  Location: [facility] OR;  Service: Open Heart Surgery;  Laterality: N/A;     Current Outpatient Medications  Medication Sig Dispense Refill   albuterol (PROVENTIL) (2.5 MG/3ML) 0.083% nebulizer solution Take 3 mLs (2.5 mg total) by nebulization every 6 (six) hours as needed for wheezing.     ALPRAZolam (XANAX) 0.5 MG tablet TAKE 1 TABLET(0.5 MG) BY MOUTH DAILY AS NEEDED FOR ANXIETY 30  tablet 0   amiodarone (PACERONE) 200 MG tablet Take 200 mg by mouth daily.     ANORO ELLIPTA 62.5-25 MCG/ACT AEPB INHALE 1 PUFF INTO THE LUNGS DAILY AT 6 AM (Patient taking differently: Inhale 1 puff into the lungs daily.) 60 each 2   aspirin 81 MG EC tablet Take 1 tablet (81 mg total) by mouth daily. Swallow whole. 90 tablet 3   Dulaglutide (TRULICITY) 1.5 MG/0.5ML SOPN Inject 1.5 mg into the skin once a week. 2 mL 2   furosemide (LASIX) 20 MG tablet Take 1 tablet (20 mg total) by mouth daily. 90 tablet 3   losartan (COZAAR) 50 MG tablet Take 1 tablet (50 mg total) by mouth daily. 90 tablet 2   metoprolol tartrate (LOPRESSOR) 25 MG tablet TAKE 1 TABLET(25 MG) BY MOUTH TWICE DAILY 180 tablet 2   Microlet Lancets MISC 1 each by Does not apply route daily in the afternoon. 100 each 2   potassium chloride SA (KLOR-CON M) 20 MEQ tablet Take 1 tablet (20 mEq total) by mouth 2 (two) times daily. 180 tablet 3   rosuvastatin (CRESTOR) 40 MG tablet Take 1 tablet (40 mg total) by mouth daily. 90 tablet 3   tadalafil (CIALIS) 5 MG tablet TAKE 1 TABLET(5 MG) BY MOUTH DAILY AS NEEDED FOR ERECTILE DYSFUNCTION 10 tablet 0   zolpidem (AMBIEN) 10 MG tablet TAKE 1 TABLET(10 MG) BY MOUTH AT BEDTIME AS  NEEDED FOR SLEEP (Patient taking differently: Take 10 mg by mouth at bedtime as needed for sleep.) 90 tablet 0   No current facility-administered medications for this visit.    Allergies:   Patient has no known allergies.   Social History:  The patient  reports that he has quit smoking. He has been exposed to tobacco smoke. He has quit using smokeless tobacco.  His smokeless tobacco use included snuff. He reports that he does not currently use alcohol. He reports that he does not use drugs.   Family History:  The patient's family history includes Alzheimer's disease in an other family member; Cancer in his maternal grandfather; Hypertension in an other family member; Liver disease in his sister; Memory loss in an  other family member.    ROS:  Please see the history of present illness.   Otherwise, review of systems is positive for none.   All other systems are reviewed and negative.    PHYSICAL EXAM: VS:  BP (!) 140/78   Pulse 98   Ht 5\' 5"  (1.651 m)   Wt 220 lb (99.8 kg)   SpO2 98%   BMI 36.61 kg/m  , BMI Body mass index is 36.61 kg/m. GEN: Well nourished, well developed, in no acute distress  HEENT: normal  Neck: no JVD, carotid bruits, or masses Cardiac: tachycardic, regular; no murmurs, rubs, or gallops,no edema  Respiratory:  clear to auscultation bilaterally, normal work of breathing GI: soft, nontender, nondistended, + BS MS: no deformity or atrophy  Skin: warm and dry Neuro:  Strength and sensation are intact Psych: euthymic mood, full affect  EKG:  EKG is ordered today. Personal review of the ekg ordered shows atrial flutter, rate 98, right bundle branch block  Recent Labs: 10/30/2021: Magnesium 2.8 04/07/2022: TSH 1.360 07/07/2022: ALT 47; BUN 10; Creatinine, Ser 1.97; Potassium 4.2; Sodium 148 08/17/2022: Hemoglobin 15.3; Platelets 172    Lipid Panel     Component Value Date/Time   CHOL 120 07/07/2022 1502   TRIG 115 07/07/2022 1502   HDL 42 07/07/2022 1502   CHOLHDL 2.9 07/07/2022 1502   LDLCALC 57 07/07/2022 1502     Wt Readings from Last 3 Encounters:  08/31/22 220 lb (99.8 kg)  07/07/22 206 lb (93.4 kg)  07/07/22 207 lb (93.9 kg)      Other studies Reviewed: Additional studies/ records that were reviewed today include: TTE 01/14/22  Review of the above records today demonstrates:   1. Left ventricular ejection fraction, by estimation, is 50 to 55%. The  left ventricle has low normal function. The left ventricle has no regional  wall motion abnormalities. There is moderate concentric left ventricular  hypertrophy. Left ventricular  diastolic parameters are indeterminate.   2. Right ventricular systolic function is mildly reduced. The right  ventricular  size is normal. There is normal pulmonary artery systolic  pressure.   3. The mitral valve is normal in structure. No evidence of mitral valve  regurgitation. No evidence of mitral stenosis.   4. Aortic valve regurgitation is not visualized. There is a AORTIC VALVE  REPLACEMENT (AVR) WITH INSPIRIS RESILIA AORTIC VALVE SIZE 25MM (N/A) valve  present in the aortic position. Procedure Date: 10/28/2021. Echo findings  are consistent with normal  structure and function of the aortic valve prosthesis.   5. Aortic Normal DTA.   6. The inferior vena cava is dilated in size with >50% respiratory  variability, suggesting right atrial pressure of 8 mmHg.    ASSESSMENT AND  PLAN:  1.  Atrial fibrillation/atypical atrial flutter: Currently on amiodarone 200 mg twice daily.  Not anticoagulated due to left atrial appendage clip.  CHA2DS2-VASc of at least 2.  He is in atrial flutter today.  I suspect that this is a left atrial flutter, due to his history of surgical maze.  He would potentially like to get back into normal rhythm and would like to be able to stop his amiodarone.  I have told him that ablation is likely the best option.  We would check the surgical maze and map out his atrial flutter.  He Akeelah Seppala think about it and let us know which she would prefer.  2.  Bicuspid aortic valve: Status post AVR.  Stable on most recent echo.  Plan per primary cardiology.  3.  Chronic combined systolic and diastolic heart failure: Ejection fraction is fortunately normalized.  Continue with optimal medical therapy per his primary cardiologist.  4.  Secondary hypercoagulable state: Status post left atrial appendage clipping at the time of surgery.  Case discussed with primary cardiology  Current medicines are reviewed at length with the patient today.   The patient does not have concerns regarding his medicines.  The following changes were made today:  none  Labs/ tests ordered today include:  Orders Placed This  Encounter  Procedures   EKG 12-Lead     Disposition:   FU with [place] Scheiderer 3 months  Signed, Jose Alleyne Jorja Loa, MD  08/31/2022 11:50 AM     [facility] [address] [place] [address] [phone] (office) [phone] (fax)

## 2022-08-31 NOTE — Patient Instructions (Signed)
Medication Instructions:  Your physician recommends that you continue on your current medications as directed. Please refer to the Current Medication list given to you today.  *If you need a refill on your cardiac medications before your next appointment, please call your pharmacy*   Lab Work: None ordered   Testing/Procedures: Your physician has recommended that you have an ablation. Catheter ablation is a medical procedure used to treat some cardiac arrhythmias (irregular heartbeats). During catheter ablation, a long, thin, flexible tube is put into a blood vessel in your groin (upper thigh), or neck. This tube is called an ablation catheter. It is then guided to your heart through the blood vessel. Radio frequency waves destroy small areas of heart tissue where abnormal heartbeats may cause an arrhythmia to start.   Please call the office if/when you are ready to schedule this procedure.    Follow-Up: At [facility], you and your health needs are our priority.  As part of our continuing mission to provide you with exceptional heart care, we have created designated Provider Care Teams.  These Care Teams include your primary Cardiologist (physician) and Advanced Practice Providers (APPs -  Physician Assistants and Nurse Practitioners) who all work together to provide you with the care you need, when you need it.  Your next appointment:   To be  determined  The format for your next appointment:   In Person  Provider:   Allegra Lai, MD    Thank you for choosing [facility]!!   Trinidad Curet, RN [phone]  Other Instructions  Cardiac Ablation Cardiac ablation is a procedure to destroy, or ablate, a small amount of heart tissue in very specific places. The heart has many electrical connections. Sometimes these connections are abnormal and can cause the heart to beat very fast or irregularly. Ablating some of the areas that cause problems can improve the heart's rhythm or  return it to normal. Ablation may be done for people who: Have Wolff-Parkinson-White syndrome. Have fast heart rhythms (tachycardia). Have taken medicines for an abnormal heart rhythm (arrhythmia) that were not effective or caused side effects. Have a high-risk heartbeat that may be life-threatening. During the procedure, a small incision is made in the neck or the groin, and a long, thin tube (catheter) is inserted into the incision and moved to the heart. Small devices (electrodes) on the tip of the catheter will send out electrical currents. A type of X-ray (fluoroscopy) will be used to help guide the catheter and to provide images of the heart. Tell a health care provider about: Any allergies you have. All medicines you are taking, including vitamins, herbs, eye drops, creams, and over-the-counter medicines. Any problems you or family members have had with anesthetic medicines. Any blood disorders you have. Any surgeries you have had. Any medical conditions you have, such as kidney failure. Whether you are pregnant or may be pregnant. What are the risks? Generally, this is a safe procedure. However, problems may occur, including: Infection. Bruising and bleeding at the catheter insertion site. Bleeding into the chest, especially into the sac that surrounds the heart. This is a serious complication. Stroke or blood clots. Damage to nearby structures or organs. Allergic reaction to medicines or dyes. Need for a permanent pacemaker if the normal electrical system is damaged. A pacemaker is a small computer that sends electrical signals to the heart and helps your heart beat normally. The procedure not being fully effective. This may not be recognized until months later. Repeat ablation  procedures are sometimes done. What happens before the procedure? Medicines Ask your health care provider about: Changing or stopping your regular medicines. This is especially important if you are taking  diabetes medicines or blood thinners. Taking medicines such as aspirin and ibuprofen. These medicines can thin your blood. Do not take these medicines unless your health care provider tells you to take them. Taking over-the-counter medicines, vitamins, herbs, and supplements. General instructions Follow instructions from your health care provider about eating or drinking restrictions. Plan to have someone take you home from the hospital or clinic. If you will be going home right after the procedure, plan to have someone with you for 24 hours. Ask your health care provider what steps will be taken to prevent infection. What happens during the procedure?  An IV will be inserted into one of your veins. You will be given a medicine to help you relax (sedative). The skin on your neck or groin will be numbed. An incision will be made in your neck or your groin. A needle will be inserted through the incision and into a large vein in your neck or groin. A catheter will be inserted into the needle and moved to your heart. Dye may be injected through the catheter to help your surgeon see the area of the heart that needs treatment. Electrical currents will be sent from the catheter to ablate heart tissue in desired areas. There are three types of energy that may be used to do this: Heat (radiofrequency energy). Laser energy. Extreme cold (cryoablation). When the tissue has been ablated, the catheter will be removed. Pressure will be held on the insertion area to prevent a lot of bleeding. A bandage (dressing) will be placed over the insertion area. The exact procedure may vary among health care providers and hospitals. What happens after the procedure? Your blood pressure, heart rate, breathing rate, and blood oxygen level will be monitored until you leave the hospital or clinic. Your insertion area will be monitored for bleeding. You will need to lie still for a few hours to ensure that you do not  bleed from the insertion area. Do not drive for 24 hours or as long as told by your health care provider. Summary Cardiac ablation is a procedure to destroy, or ablate, a small amount of heart tissue using an electrical current. This procedure can improve the heart rhythm or return it to normal. Tell your health care provider about any medical conditions you may have and all medicines you are taking to treat them. This is a safe procedure, but problems may occur. Problems may include infection, bruising, damage to nearby organs or structures, or allergic reactions to medicines. Follow your health care provider's instructions about eating and drinking before the procedure. You may also be told to change or stop some of your medicines. After the procedure, do not drive for 24 hours or as long as told by your health care provider. This information is not intended to replace advice given to you by your health care provider. Make sure you discuss any questions you have with your health care provider. Document Revised: 02/06/2022 Document Reviewed: 09/25/2019 Elsevier Patient Education  [place]

## 2022-09-02 ENCOUNTER — Ambulatory Visit (INDEPENDENT_AMBULATORY_CARE_PROVIDER_SITE_OTHER): Payer: Commercial Managed Care - HMO | Admitting: Legal Medicine

## 2022-09-02 ENCOUNTER — Encounter: Payer: Self-pay | Admitting: Legal Medicine

## 2022-09-02 VITALS — BP 120/78 | HR 102 | Temp 97.8°F | Resp 14 | Ht 65.0 in | Wt 220.0 lb

## 2022-09-02 DIAGNOSIS — Z23 Encounter for immunization: Secondary | ICD-10-CM | POA: Diagnosis not present

## 2022-09-02 DIAGNOSIS — E1142 Type 2 diabetes mellitus with diabetic polyneuropathy: Secondary | ICD-10-CM

## 2022-09-02 MED ORDER — TRULICITY 1.5 MG/0.5ML ~~LOC~~ SOAJ: 1.5000 mg | SUBCUTANEOUS | 2 refills | Status: DC

## 2022-09-02 NOTE — Progress Notes (Signed)
Subjective:  Patient ID: Marcus Hicks, male    DOB: September 16, 1966  Age: 56 y.o. MRN: [id number]  Chief Complaint  Patient presents with   Diabetes    HPI: follow up Diabetes: Patient is here for follow up on Trulicity since one month ago. He stopped [place] and Victoza.  He is not checking his blood sugar at home, but he has changed his eating habits and decreased carbs.WE discussed need for glucose testing. We discussed continuous glucose monitoring.   Current Outpatient Medications on File Prior to Visit  Medication Sig Dispense Refill   albuterol (PROVENTIL) (2.5 MG/3ML) 0.083% nebulizer solution Take 3 mLs (2.5 mg total) by nebulization every 6 (six) hours as needed for wheezing.     ALPRAZolam (XANAX) 0.5 MG tablet TAKE 1 TABLET(0.5 MG) BY MOUTH DAILY AS NEEDED FOR ANXIETY 30 tablet 0   amiodarone (PACERONE) 200 MG tablet Take 200 mg by mouth daily.     ANORO ELLIPTA 62.5-25 MCG/ACT AEPB INHALE 1 PUFF INTO THE LUNGS DAILY AT 6 AM (Patient taking differently: Inhale 1 puff into the lungs daily.) 60 each 2   aspirin 81 MG EC tablet Take 1 tablet (81 mg total) by mouth daily. Swallow whole. 90 tablet 3   BD PEN NEEDLE NANO 2ND GEN 32G X 4 MM MISC      furosemide (LASIX) 40 MG tablet TAKE 1 TABLET(40 MG) BY MOUTH DAILY 90 tablet 2   losartan (COZAAR) 50 MG tablet Take 1 tablet (50 mg total) by mouth daily. 90 tablet 2   metoprolol tartrate (LOPRESSOR) 25 MG tablet TAKE 1 TABLET(25 MG) BY MOUTH TWICE DAILY 180 tablet 2   Microlet Lancets MISC 1 each by Does not apply route daily in the afternoon. 100 each 2   potassium chloride SA (KLOR-CON M) 20 MEQ tablet Take 1 tablet (20 mEq total) by mouth 2 (two) times daily. 180 tablet 3   rosuvastatin (CRESTOR) 40 MG tablet TAKE 1 TABLET(40 MG) BY MOUTH DAILY 90 tablet 2   zolpidem (AMBIEN) 10 MG tablet TAKE 1 TABLET(10 MG) BY MOUTH AT BEDTIME AS NEEDED FOR SLEEP (Patient taking differently: Take 10 mg by mouth at bedtime as needed for  sleep.) 90 tablet 0   tadalafil (CIALIS) 5 MG tablet TAKE 1 TABLET(5 MG) BY MOUTH DAILY AS NEEDED FOR ERECTILE DYSFUNCTION (Patient not taking: Reported on 09/02/2022) 10 tablet 0   No current facility-administered medications on file prior to visit.   Past Medical History:  Diagnosis Date   Accretions on teeth 09/22/2021   Acquired thrombophilia (HCC) 08/11/2021   Acute kidney injury (HCC) 06/01/2021   Atrial fibrillation with rapid ventricular response (HCC) 08/12/2020   Bicuspid aortic valve    BMI 38.0-38.9,adult 11/26/2020   Bruxism (teeth grinding) 09/22/2021   Caries 09/22/2021   CHF (congestive heart failure) (HCC)    Chronic hepatitis C virus infection (HCC) 08/03/2013   Chronic periodontitis 09/22/2021   Cirrhosis of liver without ascites, unspecified hepatic cirrhosis type (HCC) 12/25/2021   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   Dyslipidemia 07/03/2015   Dysrhythmia    A Fib   Encounter for follow-up for aortic valve replacement 12/25/2021   Encounter for preoperative dental examination 09/22/2021   Essential hypertension 07/03/2015   Last Assessment & Plan:  Blood pressure is borderline  high.  Advised to get better sleep, walk on a regular basis, decrease weight down to about 210 and maintain, and eliminate tobacco.   Excessive dental attrition 09/22/2021   HFrEF (heart failure with reduced ejection fraction) (HCC) 08/22/2020   History of maze procedure 03/31/2022   Hyperactive gag reflex 09/22/2021   Hyperlipidemia 10/12/2016   Impacted third molar tooth 09/22/2021   Incipient enamel caries 09/22/2021   Long term (current) use of anticoagulants 09/22/2021   Long-term insulin use (HCC) 10/12/2016   Morbid obesity (HCC) 10/12/2016   Patient advised to lose down to about 210 pounds and  maintained.  Regular walking program recommended.  He knows how to lose weight.   Obesity, unspecified 10/12/2016   Patient advised to lose down to about 210 pounds and maintained.  Regular walking program recommended.  He knows how to lose weight.   Phobia of dental procedure 09/22/2021   Routine general medical examination at a health care facility 01/28/2021   S/P AVR (aortic valve replacement) and aortoplasty 10/28/2021   Sleep apnea    Teeth missing 09/22/2021   Thoracic aortic aneurysm without rupture, unspecified part (HCC) 03/31/2022   Type 2 diabetes mellitus with diabetic polyneuropathy (HCC) 10/12/2016   Past Surgical History:  Procedure Laterality Date   AORTIC ROOT ENLARGEMENT  10/28/2021   Procedure: AORTIC ROOT ENLARGEMENT WITH HEMASHIELD PLATINUM X 30CM;  Surgeon: Corliss Skains, MD;  Location: [facility] OR;  Service: Open Heart Surgery;;   AORTIC VALVE REPLACEMENT N/A 10/28/2021   Procedure: AORTIC VALVE REPLACEMENT (AVR) WITH INSPIRIS RESILIA AORTIC VALVE SIZE ;  Surgeon: Corliss Skains, MD;  Location: [facility] OR;  Service: Open Heart Surgery;  Laterality: N/A;   CARDIAC CATHETERIZATION     CARDIOVERSION N/A 09/24/2020   Procedure: CARDIOVERSION;  Surgeon: [name], MD;  Location: [facility] ENDOSCOPY;  Service: Cardiovascular;  Laterality: N/A;   CARDIOVERSION N/A 08/13/2021   Procedure: CARDIOVERSION;  Surgeon: [name], MD;  Location: [facility] ENDOSCOPY;  Service: Cardiovascular;  Laterality: N/A;   CARDIOVERSION N/A 02/26/2022   Procedure: CARDIOVERSION;  Surgeon: Thomasene Ripple, DO;  Location: [facility] ENDOSCOPY;  Service: Cardiovascular;  Laterality: N/A;   CCTA  08/2019   CLIPPING OF ATRIAL APPENDAGE N/A 10/28/2021   Procedure: CLIPPING OF ATRIAL APPENDAGE WITH ATRICLIP ACHV45;  Surgeon: Corliss Skains, MD;  Location: [facility] OR;  Service: Open Heart Surgery;  Laterality: N/A;   MAZE N/A 10/28/2021   Procedure: MAZE;  Surgeon: Corliss Skains, MD;   Location: [facility] OR;  Service: Open Heart Surgery;  Laterality: N/A;   RIGHT/LEFT HEART CATH AND CORONARY ANGIOGRAPHY N/A 09/26/2021   Procedure: RIGHT/LEFT HEART CATH AND CORONARY ANGIOGRAPHY;  Surgeon: [place], Peter M, MD;  Location: [facility] INVASIVE CV LAB;  Service: Cardiovascular;  Laterality: N/A;   TEE WITHOUT CARDIOVERSION  09/24/2020   Procedure: TRANSESOPHAGEAL ECHOCARDIOGRAM (TEE);  Surgeon: [name], MD;  Location: [facility] ENDOSCOPY;  Service: Cardiovascular;;   TEE WITHOUT CARDIOVERSION N/A 10/28/2021   Procedure: TRANSESOPHAGEAL ECHOCARDIOGRAM (TEE);  Surgeon: Corliss Skains, MD;  Location: [facility] OR;  Service: Open Heart Surgery;  Laterality: N/A;    Family History  Problem Relation Age of Onset   Cancer Maternal Grandfather    Liver disease Sister    Alzheimer's disease Other    Memory loss Other    Hypertension Other    Social History   Socioeconomic History   Marital status: Widowed    Spouse name: Not on file   Number of children:  Not on file   Years of education: Not on file   Highest education level: Not on file  Occupational History   Not on file  Tobacco Use   Smoking status: Former    Passive exposure: Past   Smokeless tobacco: Former    Types: Snuff   Tobacco comments:    Former snuff 08/11/2021  Vaping Use   Vaping Use: Never used  Substance and Sexual Activity   Alcohol use: Not Currently   Drug use: Never   Sexual activity: Never  Other Topics Concern   Not on file  Social History Narrative   Not on file   Social Determinants of Health   Financial Resource Strain: Not on file  Food Insecurity: Not on file  Transportation Needs: Not on file  Physical Activity: Not on file  Stress: Not on file  Social Connections: Not on file    Review of Systems  Constitutional:  Negative for chills, fatigue, fever and unexpected weight change.  HENT:  Negative for congestion, ear pain, sinus pain and sore throat.   Eyes:  Negative for visual  disturbance.  Respiratory:  Negative for cough and shortness of breath.   Cardiovascular:  Negative for chest pain and palpitations.  Gastrointestinal:  Negative for abdominal pain, blood in stool, constipation, diarrhea, nausea and vomiting.  Endocrine: Negative for polydipsia.  Genitourinary:  Negative for dysuria.  Musculoskeletal:  Negative for back pain.  Skin:  Negative for rash.  Neurological:  Negative for headaches.     Objective:  BP 120/78   Pulse (!) 102   Temp 97.8 F (36.6 C)   Resp 14   Ht 5\' 5"  (1.651 m)   Wt 220 lb (99.8 kg)   SpO2 95%   BMI 36.61 kg/m      09/02/2022    2:25 PM 08/31/2022   11:17 AM 07/07/2022    3:20 PM  BP/Weight  Systolic BP 299 371 696  Diastolic BP 78 78 80  Wt. (Lbs) 220 220 206  BMI 36.61 kg/m2 36.61 kg/m2 34.28 kg/m2      09/02/2022    2:48 PM 08/26/2021    3:02 PM 07/03/2021    2:03 PM 11/26/2020   10:39 AM 02/24/2020    4:08 PM  Depression screen PHQ 2/9  Decreased Interest 0 0 0 1 0  Down, Depressed, Hopeless 1 0 0 0 0  PHQ - 2 Score 1 0 0 1 0  Altered sleeping 1      Tired, decreased energy 1      Change in appetite 0      Feeling bad or failure about yourself  0      Trouble concentrating 0      Moving slowly or fidgety/restless 0      Suicidal thoughts 0      PHQ-9 Score 3      Difficult doing work/chores Not difficult at all         Physical Exam Vitals reviewed.  Constitutional:      General: He is not in acute distress.    Appearance: Normal appearance.  HENT:     Head: Normocephalic.     Right Ear: Tympanic membrane normal.     Left Ear: Tympanic membrane normal.     Mouth/Throat:     Mouth: Mucous membranes are moist.     Pharynx: Oropharynx is clear.  Eyes:     Conjunctiva/sclera: Conjunctivae normal.     Pupils: Pupils are equal, round, and  reactive to light.  Cardiovascular:     Rate and Rhythm: Normal rate and regular rhythm.     Pulses: Normal pulses.     Heart sounds: Normal heart sounds. No  murmur heard.    No gallop.  Pulmonary:     Effort: Pulmonary effort is normal. No respiratory distress.     Breath sounds: Normal breath sounds. No wheezing.  Abdominal:     General: Abdomen is flat. Bowel sounds are normal. There is no distension.     Palpations: Abdomen is soft.     Tenderness: There is no abdominal tenderness.  Musculoskeletal:        General: Normal range of motion.     Right lower leg: No edema.     Left lower leg: No edema.  Skin:    General: Skin is warm.     Capillary Refill: Capillary refill takes less than 2 seconds.  Neurological:     General: No focal deficit present.     Mental Status: He is alert and oriented to person, place, and time. Mental status is at baseline.  Psychiatric:        Mood and Affect: Mood normal.        Thought Content: Thought content normal.        Judgment: Judgment normal.         Lab Results  Component Value Date   WBC 6.2 08/17/2022   HGB 15.3 08/17/2022   HCT 48.2 08/17/2022   PLT 172 08/17/2022   GLUCOSE 109 (H) 07/07/2022   CHOL 120 07/07/2022   TRIG 115 07/07/2022   HDL 42 07/07/2022   LDLCALC 57 07/07/2022   ALT 47 (H) 07/07/2022   AST 34 07/07/2022   NA 148 (H) 07/07/2022   K 4.2 07/07/2022   CL 103 07/07/2022   CREATININE 1.97 (H) 07/07/2022   BUN 10 07/07/2022   CO2 27 07/07/2022   TSH 1.360 04/07/2022   INR 3.3 (A) 03/24/2022   HGBA1C 7.4 (H) 09/02/2022   MICROALBUR 30 04/09/2021      Assessment & Plan:   Problem List Items Addressed This Visit       Endocrine   Type 2 diabetes mellitus with diabetic polyneuropathy (HCC) - Primary   Relevant Medications   Dulaglutide (TRULICITY) 1.5 MG/0.5ML SOPN   Other Relevant Orders   Hemoglobin A1c (Completed) Patient stable on trulicity   Other Visit Diagnoses     Encounter for immunization       Relevant Orders   Flu Vaccine MDCK QUAD PF (Completed)     .  Meds ordered this encounter  Medications   Dulaglutide (TRULICITY) 1.5  MG/0.5ML SOPN    Sig: Inject 1.5 mg into the skin once a week.    Dispense:  2 mL    Refill:  2    Orders Placed This Encounter  Procedures   Flu Vaccine MDCK QUAD PF   Hemoglobin A1c     Follow-up: Return in about 3 months (around 12/03/2022).  An After Visit Summary was printed and given to the patient.  Brent Bulla, MD [facility] [phone]

## 2022-09-03 LAB — HEMOGLOBIN A1C
Est. average glucose Bld gHb Est-mCnc: 166 mg/dL
Hgb A1c MFr Bld: 7.4 % — ABNORMAL HIGH (ref 4.8–5.6)

## 2022-09-03 NOTE — Progress Notes (Signed)
A1c 7.4,  lp

## 2022-09-29 ENCOUNTER — Other Ambulatory Visit: Payer: Self-pay | Admitting: Cardiology

## 2022-09-30 ENCOUNTER — Encounter: Payer: Self-pay | Admitting: Cardiology

## 2022-09-30 ENCOUNTER — Ambulatory Visit: Payer: Commercial Managed Care - HMO | Attending: Cardiology | Admitting: Cardiology

## 2022-09-30 VITALS — BP 132/90 | HR 97 | Ht 65.0 in | Wt 209.4 lb

## 2022-09-30 DIAGNOSIS — I110 Hypertensive heart disease with heart failure: Secondary | ICD-10-CM

## 2022-09-30 DIAGNOSIS — I484 Atypical atrial flutter: Secondary | ICD-10-CM | POA: Diagnosis not present

## 2022-09-30 DIAGNOSIS — Z952 Presence of prosthetic heart valve: Secondary | ICD-10-CM | POA: Diagnosis not present

## 2022-09-30 DIAGNOSIS — Z79899 Other long term (current) drug therapy: Secondary | ICD-10-CM

## 2022-09-30 DIAGNOSIS — K7460 Unspecified cirrhosis of liver: Secondary | ICD-10-CM

## 2022-09-30 DIAGNOSIS — R5383 Other fatigue: Secondary | ICD-10-CM

## 2022-09-30 DIAGNOSIS — E782 Mixed hyperlipidemia: Secondary | ICD-10-CM

## 2022-09-30 NOTE — Patient Instructions (Addendum)
Medication Instructions:  Your physician has recommended you make the following change in your medication:   DECREASE: Amiodarone to 100mg  daily   Lab Work: CMP, TSH, T3, T4 - order in for Dr. Tobie Poet If you have labs (blood work) drawn today and your tests are completely normal, you will receive your results only by: [place] (if you have [place]) OR A paper copy in the mail If you have any lab test that is abnormal or we need to change your treatment, we will call you to review the results.   Testing/Procedures: None Ordered   Follow-Up: At [facility], you and your health needs are our priority.  As part of our continuing mission to provide you with exceptional heart care, we have created designated Provider Care Teams.  These Care Teams include your primary Cardiologist (physician) and Advanced Practice Providers (APPs -  Physician Assistants and Nurse Practitioners) who all work together to provide you with the care you need, when you need it.  We recommend signing up for the patient portal called "MyChart".  Sign up information is provided on this After Visit Summary.  MyChart is used to connect with patients for Virtual Visits (Telemedicine).  Patients are able to view lab/test results, encounter notes, upcoming appointments, etc.  Non-urgent messages can be sent to your provider as well.   To learn more about what you can do with MyChart, go to [email or web address].    Your next appointment:   6 month(s)  The format for your next appointment:   In Person  Provider:   Shirlee More, MD    Other Instructions NA

## 2022-09-30 NOTE — Progress Notes (Signed)
Cardiology Office Note:    Date:  09/30/2022   ID:  Marcus Hicks, DOB 08-06-1966, MRN [id number]  PCP:  Lillard Anes, MD  Cardiologist:  Shirlee More, MD    Referring MD: Lillard Anes,*    ASSESSMENT:    1. Atypical atrial flutter ([place])   2. On amiodarone therapy   3. S/P AVR (aortic valve replacement) and aortoplasty   4. Hypertensive heart disease with heart failure ([place])   5. Mixed hyperlipidemia   6. Cirrhosis of liver without ascites, unspecified hepatic cirrhosis type ([place])    PLAN:    In order of problems listed above:  For now he tolerates his atypical atrial flutter with 2-1 conduction asymptomatic he plans to have further EP catheter ablation but wants to delay we will wait to see amiodarone to 100 mg a day follow-up labs ordered in his PCP office when he has his visit in January and continue his current beta-blocker and asked him to trend his heart rates at home and let me know if they increase significantly.  He is not anticoagulated with occlusion of his left atrial appendage surgically Stable after aortic valve surgery normal valve function Heart failure nicely compensated he will continue his current diuretic Continue his statin lipids are at target I have a hard time reconciling a history of cirrhosis and near normal liver function test but I do think long-term to be beneficial for him to get off amiodarone after he has further ablation   Next appointment: 6 months   Medication Adjustments/Labs and Tests Ordered: Current medicines are reviewed at length with the patient today.  Concerns regarding medicines are outlined above.  No orders of the defined types were placed in this encounter.  No orders of the defined types were placed in this encounter.   Chief complaint following up especially for his atrial flutter   History of Present Illness:    Marcus Hicks is a 56 y.o. male with a hx of very complex heart disease  including cardiothoracic surgery 10/28/2021 with surgical aortic valve replacement aortic root surgery surgical maze procedure and left atrial appendage occlusion, atrial fibrillation on amiodarone to maintain sinus rhythm heart failure previously reduced ejection fraction mild CAD hyperlipidemia and hepatic cirrhosis last seen by me 06/30/2022.  He was in atrial tachycardia or slow atrial flutter with 2-1 conduction amiodarone dosage was increased and he was referred back to electrophysiology.  He was seen by EP 08/31/2022 in the same rhythm with a recommendation undergo EP catheter ablation.  His last echocardiogram 01/14/2022 an ejection fraction of 50 to 55%.  CT of the abdomen July 2022 showed a nodular appearance suspicious for cirrhosis.  He had no splenomegaly  Recent labs hemoglobin A1c above target 7.4% Recent lipid profile 2 months ago with an LDL of 57 total cholesterol 120 and non-HDL cholesterol of 78 Liver function test 2 months ago normal bilirubin mild elevation of ALT liver functions were improved.  Compliance with diet, lifestyle and medications: Yes  He continues to improve.  His liver function is near normal and he is having little or no cardiovascular symptoms despite 2-1 conduction with an effective heart rate of 100 bpm.  He is having no angina dyspnea palpitations syncope.  He is having no side effects from amiodarone.  He will have labs drawn in January at a [place] office and I placed an order to check his thyroids and liver He is going to have further EP catheter ablation but  wants to delay and request to reduce his amiodarone dose decreased to 100 mg daily Past Medical History:  Diagnosis Date   Accretions on teeth 09/22/2021   Acquired thrombophilia (HCC) 08/11/2021   Acute kidney injury (HCC) 06/01/2021   Atrial fibrillation with rapid ventricular response (HCC) 08/12/2020   Bicuspid aortic valve    BMI 38.0-38.9,adult 11/26/2020   Bruxism (teeth grinding) 09/22/2021    Caries 09/22/2021   CHF (congestive heart failure) (HCC)    Chronic hepatitis C virus infection (HCC) 08/03/2013   Chronic periodontitis 09/22/2021   Cirrhosis of liver without ascites, unspecified hepatic cirrhosis type (HCC) 12/25/2021   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   Dyslipidemia 07/03/2015   Dysrhythmia    A Fib   Encounter for follow-up for aortic valve replacement 12/25/2021   Encounter for preoperative dental examination 09/22/2021   Essential hypertension 07/03/2015   Last Assessment & Plan:  Blood pressure is borderline high.  Advised to get better sleep, walk on a regular basis, decrease weight down to about 210 and maintain, and eliminate tobacco.   Excessive dental attrition 09/22/2021   HFrEF (heart failure with reduced ejection fraction) (HCC) 08/22/2020   History of maze procedure 03/31/2022   Hyperactive gag reflex 09/22/2021   Hyperlipidemia 10/12/2016   Impacted third molar tooth 09/22/2021   Incipient enamel caries 09/22/2021   Long term (current) use of anticoagulants 09/22/2021   Long-term insulin use (HCC) 10/12/2016   Morbid obesity (HCC) 10/12/2016   Patient advised to lose down to about 210 pounds and maintained.  Regular walking program recommended.  He knows how to lose weight.   Obesity, unspecified 10/12/2016   Patient advised to lose down to about 210 pounds and maintained.  Regular walking program recommended.  He knows how to lose weight.   Phobia of dental procedure 09/22/2021   Routine general medical examination at a health care facility 01/28/2021   S/P AVR (aortic valve replacement) and aortoplasty 10/28/2021   Sleep apnea    Teeth missing 09/22/2021   Thoracic aortic aneurysm without rupture, unspecified part (HCC) 03/31/2022   Type 2 diabetes mellitus with  diabetic polyneuropathy (HCC) 10/12/2016    Past Surgical History:  Procedure Laterality Date   AORTIC ROOT ENLARGEMENT  10/28/2021   Procedure: AORTIC ROOT ENLARGEMENT WITH HEMASHIELD PLATINUM X 30CM;  Surgeon: Corliss Skains, MD;  Location: [facility] OR;  Service: Open Heart Surgery;;   AORTIC VALVE REPLACEMENT N/A 10/28/2021   Procedure: AORTIC VALVE REPLACEMENT (AVR) WITH INSPIRIS RESILIA AORTIC VALVE SIZE ;  Surgeon: Corliss Skains, MD;  Location: [facility] OR;  Service: Open Heart Surgery;  Laterality: N/A;   CARDIAC CATHETERIZATION     CARDIOVERSION N/A 09/24/2020   Procedure: CARDIOVERSION;  Surgeon: [name], MD;  Location: [facility] ENDOSCOPY;  Service: Cardiovascular;  Laterality: N/A;   CARDIOVERSION N/A 08/13/2021   Procedure: CARDIOVERSION;  Surgeon: [name], MD;  Location: [facility] ENDOSCOPY;  Service: Cardiovascular;  Laterality: N/A;   CARDIOVERSION N/A 02/26/2022   Procedure: CARDIOVERSION;  Surgeon: Thomasene Ripple, DO;  Location: [facility] ENDOSCOPY;  Service: Cardiovascular;  Laterality: N/A;   CCTA  08/2019   CLIPPING OF ATRIAL APPENDAGE N/A 10/28/2021   Procedure: CLIPPING OF ATRIAL APPENDAGE WITH ATRICLIP ACHV45;  Surgeon: Corliss Skains, MD;  Location: [facility] OR;  Service:  Open Heart Surgery;  Laterality: N/A;   MAZE N/A 10/28/2021   Procedure: MAZE;  Surgeon: Corliss Skains, MD;  Location: [facility] OR;  Service: Open Heart Surgery;  Laterality: N/A;   RIGHT/LEFT HEART CATH AND CORONARY ANGIOGRAPHY N/A 09/26/2021   Procedure: RIGHT/LEFT HEART CATH AND CORONARY ANGIOGRAPHY;  Surgeon: [place], Peter M, MD;  Location: [facility] INVASIVE CV LAB;  Service: Cardiovascular;  Laterality: N/A;   TEE WITHOUT CARDIOVERSION  09/24/2020   Procedure: TRANSESOPHAGEAL ECHOCARDIOGRAM (TEE);  Surgeon: [name], MD;  Location: [facility] ENDOSCOPY;  Service: Cardiovascular;;   TEE WITHOUT CARDIOVERSION N/A 10/28/2021   Procedure: TRANSESOPHAGEAL ECHOCARDIOGRAM (TEE);   Surgeon: Corliss Skains, MD;  Location: [facility] OR;  Service: Open Heart Surgery;  Laterality: N/A;    Current Medications: Current Meds  Medication Sig   acetaminophen (TYLENOL) 500 MG tablet Take 500 mg by mouth every 6 (six) hours as needed for mild pain or fever.   albuterol (PROVENTIL) (2.5 MG/3ML) 0.083% nebulizer solution Take 3 mLs (2.5 mg total) by nebulization every 6 (six) hours as needed for wheezing.   ALPRAZolam (XANAX) 0.5 MG tablet TAKE 1 TABLET(0.5 MG) BY MOUTH DAILY AS NEEDED FOR ANXIETY   amiodarone (PACERONE) 200 MG tablet Take 100 mg by mouth daily.   ANORO ELLIPTA 62.5-25 MCG/ACT AEPB INHALE 1 PUFF INTO THE LUNGS DAILY AT 6 AM (Patient taking differently: Inhale 1 puff into the lungs daily.)   aspirin 81 MG EC tablet Take 1 tablet (81 mg total) by mouth daily. Swallow whole.   BD PEN NEEDLE NANO 2ND GEN 32G X 4 MM MISC    Dulaglutide (TRULICITY) 1.5 MG/0.5ML SOPN Inject 1.5 mg into the skin once a week.   furosemide (LASIX) 40 MG tablet TAKE 1 TABLET(40 MG) BY MOUTH DAILY   guaiFENesin (MUCINEX PO) Take by mouth. 1 tablespoon daily as needed for congestion   losartan (COZAAR) 50 MG tablet Take 1 tablet (50 mg total) by mouth daily.   MAGNESIUM PO Take 420 mg by mouth daily.   metoprolol tartrate (LOPRESSOR) 25 MG tablet TAKE 1 TABLET(25 MG) BY MOUTH TWICE DAILY   Microlet Lancets MISC 1 each by Does not apply route daily in the afternoon.   Omega-3 Fatty Acids (OMEGA 3 500 PO) Take 1 capsule by mouth daily.   potassium chloride SA (KLOR-CON M) 20 MEQ tablet Take 1 tablet (20 mEq total) by mouth 2 (two) times daily.   rosuvastatin (CRESTOR) 40 MG tablet TAKE 1 TABLET(40 MG) BY MOUTH DAILY   zolpidem (AMBIEN) 10 MG tablet TAKE 1 TABLET(10 MG) BY MOUTH AT BEDTIME AS NEEDED FOR SLEEP (Patient taking differently: Take 10 mg by mouth at bedtime as needed for sleep.)   [DISCONTINUED] tadalafil (CIALIS) 5 MG tablet TAKE 1 TABLET(5 MG) BY MOUTH DAILY AS NEEDED FOR ERECTILE  DYSFUNCTION     Allergies:   Patient has no known allergies.   Social History   Socioeconomic History   Marital status: Widowed    Spouse name: Not on file   Number of children: Not on file   Years of education: Not on file   Highest education level: Not on file  Occupational History   Not on file  Tobacco Use   Smoking status: Former    Passive exposure: Past   Smokeless tobacco: Former    Types: Snuff   Tobacco comments:    Former snuff 08/11/2021  Vaping Use   Vaping Use: Never used  Substance and Sexual Activity   Alcohol  use: Not Currently   Drug use: Never   Sexual activity: Never  Other Topics Concern   Not on file  Social History Narrative   Not on file   Social Determinants of Health   Financial Resource Strain: Not on file  Food Insecurity: Not on file  Transportation Needs: Not on file  Physical Activity: Not on file  Stress: Not on file  Social Connections: Not on file     Family History: The patient's family history includes Alzheimer's disease in an other family member; Cancer in his maternal grandfather; Hypertension in an other family member; Liver disease in his sister; Memory loss in an other family member. ROS:   Please see the history of present illness.    All other systems reviewed and are negative.  EKGs/Labs/Other Studies Reviewed:    The following studies were reviewed today:  EKG:  EKG ordered today and personally reviewed.  The ekg ordered today demonstrates atrial flutter atypical 2-1 conduction heart rate 97 bpm right bundle branch block  Recent Labs: 10/30/2021: Magnesium 2.8 04/07/2022: TSH 1.360 07/07/2022: ALT 47; BUN 10; Creatinine, Ser 1.97; Potassium 4.2; Sodium 148 08/17/2022: Hemoglobin 15.3; Platelets 172  Recent Lipid Panel    Component Value Date/Time   CHOL 120 07/07/2022 1502   TRIG 115 07/07/2022 1502   HDL 42 07/07/2022 1502   CHOLHDL 2.9 07/07/2022 1502   LDLCALC 57 07/07/2022 1502    Physical Exam:    VS:   BP (!) 132/90 (BP Location: Right Arm, Patient Position: Sitting)   Pulse 97   Ht 5\' 5"  (1.651 m)   Wt 209 lb 6.4 oz (95 kg)   SpO2 99%   BMI 34.85 kg/m     Wt Readings from Last 3 Encounters:  09/30/22 209 lb 6.4 oz (95 kg)  09/02/22 220 lb (99.8 kg)  08/31/22 220 lb (99.8 kg)     GEN: He looks healthy well nourished, well developed in no acute distress HEENT: Normal NECK: No JVD; No carotid bruits LYMPHATICS: No lymphadenopathy CARDIAC: RRR, no murmurs, rubs, gallops RESPIRATORY:  Clear to auscultation without rales, wheezing or rhonchi  ABDOMEN: Soft, non-tender, non-distended MUSCULOSKELETAL:  No edema; No deformity  SKIN: Warm and dry NEUROLOGIC:  Alert and oriented x 3 PSYCHIATRIC:  Normal affect    Signed, 10/31/22, MD  09/30/2022 3:52 PM    [facility]

## 2022-09-30 NOTE — Telephone Encounter (Signed)
Tadalafil 5 mg # 10 tablets x 1 refill sent to pharmacy. Ok per Dr Shirlee More

## 2022-10-01 ENCOUNTER — Encounter: Payer: Self-pay | Admitting: Physician Assistant

## 2022-10-01 ENCOUNTER — Other Ambulatory Visit: Payer: Self-pay | Admitting: Legal Medicine

## 2022-10-01 ENCOUNTER — Telehealth: Payer: Self-pay | Admitting: Cardiology

## 2022-10-01 ENCOUNTER — Ambulatory Visit (INDEPENDENT_AMBULATORY_CARE_PROVIDER_SITE_OTHER): Payer: Commercial Managed Care - HMO | Admitting: Physician Assistant

## 2022-10-01 VITALS — BP 100/68 | HR 94 | Temp 97.5°F | Ht 65.0 in | Wt 209.0 lb

## 2022-10-01 DIAGNOSIS — J40 Bronchitis, not specified as acute or chronic: Secondary | ICD-10-CM | POA: Diagnosis not present

## 2022-10-01 DIAGNOSIS — R051 Acute cough: Secondary | ICD-10-CM | POA: Diagnosis not present

## 2022-10-01 LAB — POC COVID19 BINAXNOW: SARS Coronavirus 2 Ag: NEGATIVE

## 2022-10-01 MED ORDER — PREDNISONE 20 MG PO TABS: ORAL_TABLET | ORAL | 0 refills | Status: AC

## 2022-10-01 MED ORDER — HYDROCODONE BIT-HOMATROP MBR 5-1.5 MG/5ML PO SOLN: 5.0000 mL | Freq: Four times a day (QID) | ORAL | 0 refills | Status: DC | PRN

## 2022-10-01 MED ORDER — AMOXICILLIN-POT CLAVULANATE 875-125 MG PO TABS: 1.0000 | ORAL_TABLET | Freq: Two times a day (BID) | ORAL | 0 refills | Status: DC

## 2022-10-01 NOTE — Telephone Encounter (Signed)
Pt called [facility] and wanted to schedule his Atrial Flutter Ablation with Dr. Curt Bears.    Pt chose 02/18/2023 at 130 pm;  Penciled in RN book, but procedural scheduling has not been called yet.    Pt has some questions for Dr. Curt Bears RN, but discussed procedure / 30 Day CBC - BMP check with patient.    Pt told that we will reach out to him in the near future to finalize the procedure letter, and make him aware of his blood draw lab visit date.    Follow up will be required.  Forwarding info to Dr. Curt Bears RN.

## 2022-10-01 NOTE — Progress Notes (Signed)
Acute Office Visit  Subjective:    Patient ID: Marcus Hicks, male    DOB: 1966-01-17, 56 y.o.   MRN: [id number]  Chief Complaint  Patient presents with   Cough    Chest congestion    HPI: Patient is in today for complaints of cough, congestion and pnd for the past 2 weeks.  States cough has been productive - he is using his nebulizer treatment as directed and anoro inhaler.  He denies fever or malaise.  Has had some wheezing.    Past Medical History:  Diagnosis Date   Accretions on teeth 09/22/2021   Acquired thrombophilia ([place]) 08/11/2021   Acute kidney injury ([place]) 06/01/2021   Atrial fibrillation with rapid ventricular response ([place]) 08/12/2020   Bicuspid aortic valve    BMI 38.0-38.9,adult 11/26/2020   Bruxism (teeth grinding) 09/22/2021   [place] 09/22/2021   CHF (congestive heart failure) (HCC)    Chronic hepatitis C virus infection ([place]) 08/03/2013   Chronic periodontitis 09/22/2021   Cirrhosis of liver without ascites, unspecified hepatic cirrhosis type ([place]) 12/25/2021   Clinical xerostomia 09/22/2021   COPD (chronic obstructive pulmonary disease) ([place])    Coronary artery disease    COVID-19 virus infection 06/01/2021   Diabetes mellitus type 2, insulin dependent ([place])    Diastema 09/22/2021   Dyslipidemia 07/03/2015   Dysrhythmia    A Fib   Encounter for follow-up for aortic valve replacement 12/25/2021   Encounter for preoperative dental examination 09/22/2021   Essential hypertension 07/03/2015   Last Assessment & Plan:  Blood pressure is borderline high.  Advised to get better sleep, walk on a regular basis, decrease weight down to about 210 and maintain, and eliminate tobacco.   Excessive dental attrition 09/22/2021   HFrEF (heart failure with reduced ejection fraction) ([place]) 08/22/2020   History of maze procedure 03/31/2022   Hyperactive gag reflex 09/22/2021   Hyperlipidemia 10/12/2016   Impacted third molar tooth 09/22/2021   Incipient enamel caries  09/22/2021   Long term (current) use of anticoagulants 09/22/2021   Long-term insulin use ([place]) 10/12/2016   Morbid obesity ([place]) 10/12/2016   Patient advised to lose down to about 210 pounds and maintained.  Regular walking program recommended.  He knows how to lose weight.   Obesity, unspecified 10/12/2016   Patient advised to lose down to about 210 pounds and maintained.  Regular walking program recommended.  He knows how to lose weight.   Phobia of dental procedure 09/22/2021   Routine general medical examination at a health care facility 01/28/2021   S/P AVR (aortic valve replacement) and aortoplasty 10/28/2021   Sleep apnea    Teeth missing 09/22/2021   Thoracic aortic aneurysm without rupture, unspecified part ([place]) 03/31/2022   Type 2 diabetes mellitus with diabetic polyneuropathy ([place]) 10/12/2016    Past Surgical History:  Procedure Laterality Date   AORTIC ROOT ENLARGEMENT  10/28/2021   Procedure: AORTIC ROOT ENLARGEMENT WITH HEMASHIELD PLATINUM 28MM X 30CM;  Surgeon: Lajuana Matte, MD;  Location: [place];  Service: Open Heart Surgery;;   AORTIC VALVE REPLACEMENT N/A 10/28/2021   Procedure: AORTIC VALVE REPLACEMENT (AVR) WITH INSPIRIS RESILIA AORTIC VALVE SIZE 25MM;  Surgeon: Lajuana Matte, MD;  Location: [place];  Service: Open Heart Surgery;  Laterality: N/A;   CARDIAC CATHETERIZATION     CARDIOVERSION N/A 09/24/2020   Procedure: CARDIOVERSION;  Surgeon: Donato [name], MD;  Location[facility]COPY;  Service: Cardiovascular;  Laterality: N/A;   CARDIOVERSION N/A 08/13/2021  Procedure: CARDIOVERSION;  Surgeon: Elouise Munroe, MD;  Location: [facility] ENDOSCOPY;  Service: Cardiovascular;  Laterality: N/A;   CARDIOVERSION N/A 02/26/2022   Procedure: CARDIOVERSION;  Surgeon: Berniece Salines, DO;  Location: [place];  Service: Cardiovascular;  Laterality: N/A;   CCTA  08/2019   CLIPPING OF ATRIAL APPENDAGE N/A 10/28/2021   Procedure: CLIPPING OF ATRIAL APPENDAGE WITH  ATRICLIP ACHV45;  Surgeon: Lajuana Matte, MD;  Location: [place];  Service: Open Heart Surgery;  Laterality: N/A;   MAZE N/A 10/28/2021   Procedure: MAZE;  Surgeon: Lajuana Matte, MD;  Location: [place];  Service: Open Heart Surgery;  Laterality: N/A;   RIGHT/LEFT HEART CATH AND CORONARY ANGIOGRAPHY N/A 09/26/2021   Procedure: RIGHT/LEFT HEART CATH AND CORONARY ANGIOGRAPHY;  Surgeon: Martinique, Peter M, MD;  Location: [place] CV LAB;  Service: Cardiovascular;  Laterality: N/A;   TEE WITHOUT CARDIOVERSION  09/24/2020   Procedure: TRANSESOPHAGEAL ECHOCARDIOGRAM (TEE);  Surgeon: Donato [name], MD;  Location: [place];  Service: Cardiovascular;;   TEE WITHOUT CARDIOVERSION N/A 10/28/2021   Procedure: TRANSESOPHAGEAL ECHOCARDIOGRAM (TEE);  Surgeon: Lajuana Matte, MD;  Location: [place];  Service: Open Heart Surgery;  Laterality: N/A;    Family History  Problem Relation Age of Onset   Cancer Maternal Grandfather    Liver disease Sister    Alzheimer's disease Other    Memory loss Other    Hypertension Other     Social History   Socioeconomic History   Marital status: Widowed    Spouse name: Not on file   Number of children: Not on file   Years of education: Not on file   Highest education level: Not on file  Occupational History   Not on file  Tobacco Use   Smoking status: Former    Passive exposure: Past   Smokeless tobacco: Former    Types: Snuff   Tobacco comments:    Former snuff 08/11/2021  Vaping Use   Vaping Use: Never used  Substance and Sexual Activity   Alcohol use: Not Currently   Drug use: Never   Sexual activity: Never  Other Topics Concern   Not on file  Social History Narrative   Not on file   Social Determinants of Health   Financial Resource Strain: Not on file  Food Insecurity: Not on file  Transportation Needs: Not on file  Physical Activity: Not on file  Stress: Not on file  Social Connections: Not on file  Intimate  Partner Violence: Not on file    Outpatient Medications Prior to Visit  Medication Sig Dispense Refill   acetaminophen (TYLENOL) 500 MG tablet Take 500 mg by mouth every 6 (six) hours as needed for mild pain or fever.     albuterol (PROVENTIL) (2.5 MG/3ML) 0.083% nebulizer solution Take 3 mLs (2.5 mg total) by nebulization every 6 (six) hours as needed for wheezing.     ALPRAZolam (XANAX) 0.5 MG tablet TAKE 1 TABLET(0.5 MG) BY MOUTH DAILY AS NEEDED FOR ANXIETY 30 tablet 0   amiodarone (PACERONE) 200 MG tablet Take 100 mg by mouth daily.     ANORO ELLIPTA 62.5-25 MCG/ACT AEPB INHALE 1 PUFF INTO THE LUNGS DAILY AT 6 AM (Patient taking differently: Inhale 1 puff into the lungs daily.) 60 each 2   aspirin 81 MG EC tablet Take 1 tablet (81 mg total) by mouth daily. Swallow whole. 90 tablet 3   BD PEN NEEDLE NANO 2ND GEN 32G X 4 MM MISC  Dulaglutide (TRULICITY) 1.5 BW/4.6KZ SOPN Inject 1.5 mg into the skin once a week. 2 mL 2   furosemide (LASIX) 40 MG tablet TAKE 1 TABLET(40 MG) BY MOUTH DAILY 90 tablet 2   guaiFENesin (MUCINEX PO) Take by mouth. 1 tablespoon daily as needed for congestion     losartan (COZAAR) 50 MG tablet Take 1 tablet (50 mg total) by mouth daily. 90 tablet 2   MAGNESIUM PO Take 420 mg by mouth daily.     metoprolol tartrate (LOPRESSOR) 25 MG tablet TAKE 1 TABLET(25 MG) BY MOUTH TWICE DAILY 180 tablet 2   Microlet Lancets MISC 1 each by Does not apply route daily in the afternoon. 100 each 2   Omega-3 Fatty Acids (OMEGA 3 500 PO) Take 1 capsule by mouth daily.     potassium chloride SA (KLOR-CON M) 20 MEQ tablet Take 1 tablet (20 mEq total) by mouth 2 (two) times daily. 180 tablet 3   rosuvastatin (CRESTOR) 40 MG tablet TAKE 1 TABLET(40 MG) BY MOUTH DAILY 90 tablet 2   tadalafil (CIALIS) 5 MG tablet TAKE 1 TABLET(5 MG) BY MOUTH DAILY AS NEEDED FOR ERECTILE DYSFUNCTION 10 tablet 1   zolpidem (AMBIEN) 10 MG tablet TAKE 1 TABLET(10 MG) BY MOUTH AT BEDTIME AS NEEDED FOR SLEEP  (Patient taking differently: Take 10 mg by mouth at bedtime as needed for sleep.) 90 tablet 0   No facility-administered medications prior to visit.    No Known Allergies  Review of Systems CONSTITUTIONAL: see HPI E/N/T: see HPI CARDIOVASCULAR: Negative for chest pain, dizziness, RESPIRATORY:see HPI GASTROINTESTINAL: Negative for abdominal pain, acid reflux symptoms, constipation, diarrhea, nausea and vomiting.          Objective:  PHYSICAL EXAM:   VS: BP 100/68 (BP Location: Left Arm, Patient Position: Sitting)   Pulse 94   Temp (!) 97.5 F (36.4 C) (Temporal)   Ht _0  (1.651 m)   Wt 209 lb (94.8 kg)   SpO2 94%   BMI 34.78 kg/m   GEN: Well nourished, well developed, in no acute distress  HEENT: normal external ears and nose - normal external auditory canals and TMS - - Lips, Teeth and Gums - normal  Oropharynx -mild erythema/pnd  Cardiac: RRR; no murmurs, rubs,  Respiratory:  faint exp scattered rhonchi and rare wheezes noted    Office Visit on 10/01/2022  Component Date Value Ref Range Status   SARS Coronavirus 2 Ag 10/01/2022 Negative  Negative Final      Health Maintenance Due  Topic Date Due   COVID-19 Vaccine (1) Never done   OPHTHALMOLOGY EXAM  Never done   Zoster Vaccines- Shingrix (1 of 2) Never done   COLONOSCOPY (Pts 45-59yr Insurance coverage will need to be confirmed)  Never done   TETANUS/TDAP  06/11/2016    There are no preventive care reminders to display for this patient.   Lab Results  Component Value Date   TSH 1.360 04/07/2022   Lab Results  Component Value Date   WBC 6.2 08/17/2022   HGB 15.3 08/17/2022   HCT 48.2 08/17/2022   MCV 91 08/17/2022   PLT 172 08/17/2022   Lab Results  Component Value Date   NA 148 (H) 07/07/2022   K 4.2 07/07/2022   CO2 27 07/07/2022   GLUCOSE 109 (H) 07/07/2022   BUN 10 07/07/2022   CREATININE 1.97 (H) 07/07/2022   BILITOT 0.6 07/07/2022   ALKPHOS 78 07/07/2022   AST 34 07/07/2022    ALT 47 (  H) 07/07/2022   PROT 7.8 07/07/2022   ALBUMIN 5.2 (H) 07/07/2022   CALCIUM 10.3 (H) 07/07/2022   ANIONGAP 5 11/03/2021   EGFR 39 (L) 07/07/2022   Lab Results  Component Value Date   CHOL 120 07/07/2022   Lab Results  Component Value Date   HDL 42 07/07/2022   Lab Results  Component Value Date   LDLCALC 57 07/07/2022   Lab Results  Component Value Date   TRIG 115 07/07/2022   Lab Results  Component Value Date   CHOLHDL 2.9 07/07/2022   Lab Results  Component Value Date   HGBA1C 7.4 (H) 09/02/2022       Assessment & Plan:   Problem List Items Addressed This Visit   None Visit Diagnoses     Bronchitis    -  Primary   Relevant Medications   amoxicillin-clavulanate (AUGMENTIN) 875-125 MG tablet   predniSONE (DELTASONE) 20 MG tablet   HYDROcodone bit-homatropine (HYDROMET) 5-1.5 MG/5ML syrup   Other Relevant Orders   POC COVID-19 BinaxNow   Recommend to monitor glucose   Meds ordered this encounter  Medications   amoxicillin-clavulanate (AUGMENTIN) 875-125 MG tablet    Sig: Take 1 tablet by mouth 2 (two) times daily.    Dispense:  20 tablet    Refill:  0    Order Specific Question:   Supervising Provider    Answer:   Madarang Silvas   predniSONE (DELTASONE) 20 MG tablet    Sig: Take 3 tablets (60 mg total) by mouth daily with breakfast for 3 days, THEN 2 tablets (40 mg total) daily with breakfast for 3 days, THEN 1 tablet (20 mg total) daily with breakfast for 3 days.    Dispense:  18 tablet    Refill:  0    Order Specific Question:   Supervising Provider    Answer:   COX, Lynder Parents   HYDROcodone bit-homatropine (HYDROMET) 5-1.5 MG/5ML syrup    Sig: Take 5 mLs by mouth every 6 (six) hours as needed.    Dispense:  120 mL    Refill:  0    Order Specific Question:   Supervising Provider    AnswerShelton Silvas    Orders Placed This Encounter  Procedures   POC COVID-19 BinaxNow     Follow-up: Return if symptoms worsen  or fail to improve.  An After Visit Summary was printed and given to the patient.  Yetta Flock [facility] [phone]

## 2022-10-01 NOTE — Telephone Encounter (Signed)
Patient called to schedule his ablation.

## 2022-10-02 LAB — COMPREHENSIVE METABOLIC PANEL
ALT: 79 IU/L — ABNORMAL HIGH (ref 0–44)
AST: 64 IU/L — ABNORMAL HIGH (ref 0–40)
Albumin/Globulin Ratio: 1.6 (ref 1.2–2.2)
Albumin: 4.8 g/dL (ref 3.8–4.9)
Alkaline Phosphatase: 73 IU/L (ref 44–121)
BUN/Creatinine Ratio: 10 (ref 9–20)
BUN: 16 mg/dL (ref 6–24)
Bilirubin Total: 0.5 mg/dL (ref 0.0–1.2)
CO2: 26 mmol/L (ref 20–29)
Calcium: 10.7 mg/dL — ABNORMAL HIGH (ref 8.7–10.2)
Chloride: 96 mmol/L (ref 96–106)
Creatinine, Ser: 1.6 mg/dL — ABNORMAL HIGH (ref 0.76–1.27)
Globulin, Total: 3 g/dL (ref 1.5–4.5)
Glucose: 172 mg/dL — ABNORMAL HIGH (ref 70–99)
Potassium: 5.2 mmol/L (ref 3.5–5.2)
Sodium: 140 mmol/L (ref 134–144)
Total Protein: 7.8 g/dL (ref 6.0–8.5)
eGFR: 50 mL/min/{1.73_m2} — ABNORMAL LOW (ref >59)

## 2022-10-02 LAB — T3: T3, Total: 105 ng/dL (ref 71–180)

## 2022-10-02 LAB — T4: T4, Total: 10.4 ug/dL (ref 4.5–12.0)

## 2022-10-02 LAB — TSH: TSH: 0.818 u[IU]/mL (ref 0.450–4.500)

## 2022-10-08 ENCOUNTER — Other Ambulatory Visit: Payer: Self-pay

## 2022-10-08 DIAGNOSIS — E1142 Type 2 diabetes mellitus with diabetic polyneuropathy: Secondary | ICD-10-CM

## 2022-10-10 MED ORDER — ALPRAZOLAM 0.5 MG PO TABS: ORAL_TABLET | ORAL | 0 refills | Status: DC

## 2022-10-28 ENCOUNTER — Encounter: Payer: Self-pay | Admitting: Legal Medicine

## 2022-10-28 ENCOUNTER — Ambulatory Visit (INDEPENDENT_AMBULATORY_CARE_PROVIDER_SITE_OTHER): Payer: Commercial Managed Care - HMO | Admitting: Legal Medicine

## 2022-10-28 VITALS — BP 108/72 | HR 96 | Temp 97.3°F | Ht 65.0 in | Wt 205.0 lb

## 2022-10-28 DIAGNOSIS — M542 Cervicalgia: Secondary | ICD-10-CM | POA: Diagnosis not present

## 2022-10-28 DIAGNOSIS — M545 Low back pain, unspecified: Secondary | ICD-10-CM

## 2022-10-28 MED ORDER — HYDROCODONE-ACETAMINOPHEN 10-325 MG PO TABS: 1.0000 | ORAL_TABLET | Freq: Three times a day (TID) | ORAL | 0 refills | Status: AC | PRN
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
## 2022-10-30 DIAGNOSIS — Z419 Encounter for procedure for purposes other than remedying health state, unspecified: Secondary | ICD-10-CM | POA: Diagnosis not present

## 2022-11-04 ENCOUNTER — Other Ambulatory Visit: Payer: Self-pay

## 2022-11-04 DIAGNOSIS — M545 Low back pain, unspecified: Secondary | ICD-10-CM

## 2022-11-04 DIAGNOSIS — M542 Cervicalgia: Secondary | ICD-10-CM

## 2022-11-05 ENCOUNTER — Other Ambulatory Visit: Payer: Self-pay

## 2022-11-05 DIAGNOSIS — J449 Chronic obstructive pulmonary disease, unspecified: Secondary | ICD-10-CM

## 2022-11-05 MED ORDER — ANORO ELLIPTA 62.5-25 MCG/ACT IN AEPB: 1.0000 | INHALATION_SPRAY | Freq: Every day | RESPIRATORY_TRACT | 0 refills | Status: DC

## 2022-11-10 ENCOUNTER — Ambulatory Visit: Payer: Commercial Managed Care - HMO | Admitting: Legal Medicine

## 2022-11-11 ENCOUNTER — Other Ambulatory Visit: Payer: Self-pay | Admitting: Legal Medicine

## 2022-11-25 ENCOUNTER — Telehealth: Payer: Self-pay | Admitting: Cardiology
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
## 2022-11-26 NOTE — Telephone Encounter (Signed)
Spoke with pt regarding message. He stated that Marcus Hicks had already called. He also had a question for Dr. Gershon Crane nurse regarding his ablation appt. Message sent to Dory Horn for assistance.

## 2022-11-27 ENCOUNTER — Telehealth: Payer: Self-pay | Admitting: Cardiology

## 2022-11-27 NOTE — Telephone Encounter (Signed)
Pt calling to f/u on phone note from 10/01/22. Pt was supposed to have ablation scheduled for 02/18/23 however he hasn't been scheduled yet. Please advise.

## 2022-11-30 DIAGNOSIS — Z419 Encounter for procedure for purposes other than remedying health state, unspecified: Secondary | ICD-10-CM | POA: Diagnosis not present

## 2022-12-01 NOTE — Telephone Encounter (Signed)
Pt aware that 3/21 ablation spot is held w/ Dr. Curt Bears. Aware EP scheduler will be in touch soon to arrange H&P OV (in [place] preferably) within 30 days prior to procedure. Pt appreciates the information/update.

## 2022-12-07 ENCOUNTER — Ambulatory Visit (INDEPENDENT_AMBULATORY_CARE_PROVIDER_SITE_OTHER): Payer: 59 | Admitting: Physician Assistant

## 2022-12-07 ENCOUNTER — Encounter: Payer: Self-pay | Admitting: Physician Assistant

## 2022-12-07 VITALS — BP 136/84 | HR 100 | Temp 97.2°F | Ht 65.0 in | Wt 208.0 lb

## 2022-12-07 DIAGNOSIS — K7460 Unspecified cirrhosis of liver: Secondary | ICD-10-CM | POA: Diagnosis not present

## 2022-12-07 DIAGNOSIS — I10 Essential (primary) hypertension: Secondary | ICD-10-CM

## 2022-12-07 DIAGNOSIS — I5020 Unspecified systolic (congestive) heart failure: Secondary | ICD-10-CM | POA: Diagnosis not present

## 2022-12-07 DIAGNOSIS — I4819 Other persistent atrial fibrillation: Secondary | ICD-10-CM

## 2022-12-07 DIAGNOSIS — Z23 Encounter for immunization: Secondary | ICD-10-CM | POA: Diagnosis not present

## 2022-12-07 DIAGNOSIS — E1142 Type 2 diabetes mellitus with diabetic polyneuropathy: Secondary | ICD-10-CM | POA: Diagnosis not present

## 2022-12-07 DIAGNOSIS — E782 Mixed hyperlipidemia: Secondary | ICD-10-CM

## 2022-12-07 DIAGNOSIS — Z125 Encounter for screening for malignant neoplasm of prostate: Secondary | ICD-10-CM

## 2022-12-07 DIAGNOSIS — G4733 Obstructive sleep apnea (adult) (pediatric): Secondary | ICD-10-CM

## 2022-12-07 DIAGNOSIS — I25118 Atherosclerotic heart disease of native coronary artery with other forms of angina pectoris: Secondary | ICD-10-CM | POA: Diagnosis not present

## 2022-12-07 DIAGNOSIS — J449 Chronic obstructive pulmonary disease, unspecified: Secondary | ICD-10-CM | POA: Diagnosis not present

## 2022-12-07 DIAGNOSIS — F5102 Adjustment insomnia: Secondary | ICD-10-CM | POA: Diagnosis not present

## 2022-12-07 MED ORDER — MICROLET LANCETS MISC: 1.0000 | Freq: Every day | 2 refills | Status: AC

## 2022-12-07 MED ORDER — ALPRAZOLAM 0.5 MG PO TABS: ORAL_TABLET | ORAL | 0 refills | Status: DC

## 2022-12-07 MED ORDER — TRULICITY 1.5 MG/0.5ML ~~LOC~~ SOAJ: 1.5000 mg | SUBCUTANEOUS | 2 refills | Status: DC

## 2022-12-07 MED ORDER — ASPIRIN 81 MG PO TBEC: 81.0000 mg | DELAYED_RELEASE_TABLET | Freq: Every day | ORAL | 3 refills | Status: DC

## 2022-12-07 NOTE — Progress Notes (Signed)
Subjective:  Patient ID: Marcus Hicks, male    DOB: [birth year]  Age: 57 y.o. MRN: [id number]  Chief Complaint  Patient presents with   Diabetes    HPI  Pt with history of type 2 diabetes mellitus with diabetic polyneuropathy diagnosed in 2010- he is currently taking trulicity 1.5mg  weekly and is due to have refilled He states his glucose has been running 110s-150s and depends on diet Last hgb A1c was 7.4 He is overdue for eye exam He is on statin treatment and is currently on ARB  Mixed hyperlipidemia  Pt presents with hyperlipidemia. Compliance with treatment has been good The patient is compliant with medications, maintains a low cholesterol diet , follows up as directed , and maintains an exercise regimen . The patient denies experiencing any hypercholesterolemia related symptoms. Currently taking crestor 40mg  qd  Pt currently follows cardiology for heart failure with reduced ejection fraction as well as CAD and atrial fibrillation - his current medications include amiodarone, lasix, losartan, metoprolol and potassium He is currently not having any chest pain, pedal edema or dyspnea  Pt with history of COPD - he uses Anoro ellipta and albuterol neb as needed - currently not having any breathing issues  Pt with history of anxiety - uses xanax as needed  Pt with history of chronic hepatitis C and follows with GI specialty He states Dr Lyda Jester did his last colonoscopy - will call for records  Pt would like Tdap today Pt with history of primary insomnia and take ambien 10mg  qhs prn  Current Outpatient Medications on File Prior to Visit  Medication Sig Dispense Refill   acetaminophen (TYLENOL) 500 MG tablet Take 500 mg by mouth every 6 (six) hours as needed for mild pain or fever.     albuterol (PROVENTIL) (2.5 MG/3ML) 0.083% nebulizer solution Take 3 mLs (2.5 mg total) by nebulization every 6 (six) hours as needed for wheezing.     amiodarone (PACERONE) 200 MG tablet  Take 100 mg by mouth daily.     furosemide (LASIX) 20 MG tablet Take 20 mg by mouth daily.     Insulin Pen Needle (BD PEN NEEDLE NANO 2ND GEN) 32G X 4 MM MISC USE ONCE DAILY 100 each 3   losartan (COZAAR) 50 MG tablet TAKE 1 TABLET(50 MG) BY MOUTH DAILY 90 tablet 2   metoprolol tartrate (LOPRESSOR) 25 MG tablet TAKE 1 TABLET(25 MG) BY MOUTH TWICE DAILY 180 tablet 2   potassium chloride SA (KLOR-CON M) 20 MEQ tablet Take 1 tablet (20 mEq total) by mouth 2 (two) times daily. 180 tablet 3   rosuvastatin (CRESTOR) 40 MG tablet TAKE 1 TABLET(40 MG) BY MOUTH DAILY 90 tablet 2   umeclidinium-vilanterol (ANORO ELLIPTA) 62.5-25 MCG/ACT AEPB Inhale 1 puff into the lungs daily. 60 each 0   zolpidem (AMBIEN) 10 MG tablet TAKE 1 TABLET(10 MG) BY MOUTH AT BEDTIME AS NEEDED FOR SLEEP (Patient taking differently: Take 10 mg by mouth at bedtime as needed for sleep.) 90 tablet 0   No current facility-administered medications on file prior to visit.   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  Advised to get better sleep, walk on a regular basis, decrease weight down to about 210 and maintain, and eliminate tobacco.   Excessive dental attrition 09/22/2021   HFrEF (heart failure with reduced ejection fraction) ([place]) 08/22/2020   History of maze procedure 03/31/2022   Hyperactive gag reflex 09/22/2021   Hyperlipidemia 10/12/2016   Impacted third molar tooth 09/22/2021   Incipient enamel caries 09/22/2021   Long term (current) use of anticoagulants 09/22/2021   Long-term insulin use ([place]) 10/12/2016   Morbid obesity ([place]) 10/12/2016   Patient advised to lose down to about 210 pounds and maintained.  Regular walking program recommended.  He knows how to lose weight.   Obesity, unspecified 10/12/2016   Patient advised to lose down to about 210 pounds and maintained.  Regular walking program recommended.  He knows how to lose weight.   Phobia of dental procedure 09/22/2021   Routine general medical examination at a health care facility 01/28/2021   S/P AVR (aortic valve replacement) and aortoplasty 10/28/2021   Sleep apnea    Teeth missing 09/22/2021   Thoracic aortic aneurysm without rupture, unspecified part ([place]) 03/31/2022   Type 2 diabetes mellitus with diabetic polyneuropathy ([place]) 10/12/2016   Past Surgical History:  Procedure Laterality Date   AORTIC ROOT ENLARGEMENT  10/28/2021   Procedure: AORTIC ROOT ENLARGEMENT WITH HEMASHIELD PLATINUM 28MM X 30CM;  Surgeon: Lajuana Matte, MD;  Location: [facility];  Service: Open Heart Surgery;;   AORTIC VALVE REPLACEMENT N/A 10/28/2021   Procedure: AORTIC VALVE REPLACEMENT (AVR) WITH INSPIRIS RESILIA AORTIC VALVE SIZE 25MM;  Surgeon: Lajuana Matte, MD;  Location: [facility];  Service: Open Heart Surgery;  Laterality: N/A;   CARDIAC CATHETERIZATION     CARDIOVERSION N/A 09/24/2020   Procedure: CARDIOVERSION;  Surgeon: Donato [name], MD;   Location: [facility];  Service: Cardiovascular;  Laterality: N/A;   CARDIOVERSION N/A 08/13/2021   Procedure: CARDIOVERSION;  Surgeon: Elouise Munroe, MD;  Location: [facility]OSCOPY;  Service: Cardiovascular;  Laterality: N/A;   CARDIOVERSION N/A 02/26/2022   Procedure: CARDIOVERSION;  Surgeon: Berniece Salines, DO;  Location: [facility];  Service: Cardiovascular;  Laterality: N/A;   CCTA  08/2019   CLIPPING OF ATRIAL APPENDAGE N/A 10/28/2021   Procedure: CLIPPING OF ATRIAL APPENDAGE WITH ATRICLIP ACHV45;  Surgeon: Lajuana Matte, MD;  Location: [facility];  Service: Open Heart Surgery;  Laterality: N/A;   MAZE N/A 10/28/2021   Procedure: MAZE;  Surgeon: Lajuana Matte, MD;  Location: [facility];  Service: Open Heart Surgery;  Laterality: N/A;   RIGHT/LEFT HEART CATH AND CORONARY ANGIOGRAPHY N/A 09/26/2021   Procedure: RIGHT/LEFT HEART CATH AND CORONARY ANGIOGRAPHY;  Surgeon: Martinique, Peter M, MD;  Location: [facility];  Service: Cardiovascular;  Laterality: N/A;   TEE WITHOUT CARDIOVERSION  09/24/2020   Procedure: TRANSESOPHAGEAL ECHOCARDIOGRAM (TEE);  Surgeon: Oswaldo Milian  L, MD;  Location: [facility];  Service: Cardiovascular;;   TEE WITHOUT CARDIOVERSION N/A 10/28/2021   Procedure: TRANSESOPHAGEAL ECHOCARDIOGRAM (TEE);  Surgeon: Corliss Skains, MD;  Location: [facility] OR;  Service: Open Heart Surgery;  Laterality: N/A;    Family History  Problem Relation Age of Onset   Cancer Maternal Grandfather    Liver disease Sister    Alzheimer's disease Other    Memory loss Other    Hypertension Other    Social History   Socioeconomic History   Marital status: Widowed    Spouse name: Not on file   Number of children: Not on file   Years of education: Not on file   Highest education level: Not on file  Occupational History   Not on file  Tobacco Use   Smoking status: Former    Passive exposure: Past   Smokeless tobacco: Former    Types: Snuff   Tobacco comments:     Former snuff 08/11/2021  Vaping Use   Vaping Use: Never used  Substance and Sexual Activity   Alcohol use: Not Currently   Drug use: Never   Sexual activity: Never  Other Topics Concern   Not on file  Social History Narrative   Not on file   Social Determinants of Health   Financial Resource Strain: Not on file  Food Insecurity: Not on file  Transportation Needs: Not on file  Physical Activity: Not on file  Stress: Not on file  Social Connections: Not on file    Review of Systems CONSTITUTIONAL: Negative for chills, fatigue, fever, unintentional weight gain and unintentional weight loss.  E/N/T: Negative for ear pain, nasal congestion and sore throat.  CARDIOVASCULAR: Negative for chest pain, dizziness, palpitations and pedal edema.  RESPIRATORY: Negative for recent cough and dyspnea.  GASTROINTESTINAL: Negative for abdominal pain, acid reflux symptoms, constipation, diarrhea, nausea and vomiting.  MSK: Negative for arthralgias and myalgias.  INTEGUMENTARY: Negative for rash.  NEUROLOGICAL: Negative for dizziness and headaches.  PSYCHIATRIC: Negative for sleep disturbance and to question depression screen.  Negative for depression, negative for anhedonia.       Objective:  PHYSICAL EXAM:   VS: BP 136/84   Pulse 100   Temp (!) 97.2 F (36.2 C) (Temporal)   Ht 5\' 5"  (1.651 m)   Wt 208 lb (94.3 kg)   SpO2 97%   BMI 34.61 kg/m   GEN: Well nourished, well developed, in no acute distress  Cardiac: RRR; no murmurs, rubs, or gallops,no edema -  Respiratory:  normal respiratory rate and pattern with no distress - normal breath sounds with no rales, rhonchi, wheezes or rubs MS: no deformity or atrophy  Skin: warm and dry, no rash  Psych: euthymic mood, appropriate affect and demeanor   Lab Results  Component Value Date   WBC 6.2 08/17/2022   HGB 15.3 08/17/2022   HCT 48.2 08/17/2022   PLT 172 08/17/2022   GLUCOSE 172 (H) 10/01/2022   CHOL 120 07/07/2022   TRIG  115 07/07/2022   HDL 42 07/07/2022   LDLCALC 57 07/07/2022   ALT 79 (H) 10/01/2022   AST 64 (H) 10/01/2022   NA 140 10/01/2022   K 5.2 10/01/2022   CL 96 10/01/2022   CREATININE 1.60 (H) 10/01/2022   BUN 16 10/01/2022   CO2 26 10/01/2022   TSH 0.818 10/01/2022   INR 3.3 (A) 03/24/2022   HGBA1C 7.4 (H) 09/02/2022   MICROALBUR 30 04/09/2021      Assessment &  Plan:   Problem List Items Addressed This Visit       Cardiovascular and Mediastinum   Essential hypertension   Relevant Medications   aspirin EC 81 MG tablet   Other Relevant Orders   CBC with Differential/Platelet   Comprehensive metabolic panel   TSH Continue current meds   HFrEF (heart failure with reduced ejection fraction) (HCC)   Relevant Medications   aspirin EC 81 MG tablet   Dysrhythmia   Relevant Medications   aspirin EC 81 MG tablet   Other Relevant Orders   TSH Continue meds Follow with cardiology as directed   Coronary artery disease   Relevant Medications   aspirin EC 81 MG tablet     Respiratory   COPD (chronic obstructive pulmonary disease) ([place]) Continue meds        Digestive   Cirrhosis of liver without ascites, unspecified hepatic cirrhosis type ([place]) Continue GI follow up     Endocrine   Type 2 diabetes mellitus with diabetic polyneuropathy (HCC) - Primary   Relevant Medications   ALPRAZolam (XANAX) 0.5 MG tablet   aspirin EC 81 MG tablet   Dulaglutide (TRULICITY) 1.5 0000000 SOPN   Microlet Lancets MISC   Other Relevant Orders   CBC with Differential/Platelet   Comprehensive metabolic panel   Hemoglobin A1c   Microalbumin / creatinine urine ratio     Other   Hyperlipidemia   Relevant Medications   aspirin EC 81 MG tablet   Other Relevant Orders   Lipid panel   Other Visit Diagnoses     Adjustment insomnia       Prostate cancer screening       Relevant Orders   PSA   Need for tetanus, diphtheria, and acellular pertussis (Tdap) vaccine       Relevant Orders    Tdap vaccine greater than or equal to 7yo IM (Completed)     .  Meds ordered this encounter  Medications   ALPRAZolam (XANAX) 0.5 MG tablet    Sig: TAKE 1 TABLET(0.5 MG) BY MOUTH DAILY AS NEEDED FOR ANXIETY    Dispense:  30 tablet    Refill:  0    Order Specific Question:   Supervising Provider    Answer:   Sylvester Silvas   aspirin EC 81 MG tablet    Sig: Take 1 tablet (81 mg total) by mouth daily. Swallow whole.    Dispense:  90 tablet    Refill:  3    Order Specific Question:   Supervising Provider    Answer:   COX, Lynder Parents   Dulaglutide (TRULICITY) 1.5 0000000 SOPN    Sig: Inject 1.5 mg into the skin once a week.    Dispense:  2 mL    Refill:  2    Order Specific Question:   Supervising Provider    Answer:   Tagle Silvas   Microlet Lancets MISC    Sig: 1 each by Does not apply route daily in the afternoon.    Dispense:  100 each    Refill:  2    Order Specific Question:   Supervising Provider    AnswerShelton Silvas    Orders Placed This Encounter  Procedures   Tdap vaccine greater than or equal to 7yo IM   CBC with Differential/Platelet   Comprehensive metabolic panel   TSH   Lipid panel   Hemoglobin A1c   PSA   Microalbumin / creatinine urine ratio  Follow-up: Return in about 4 months (around 04/07/2023) for chronic fasting follow up.  An After Visit Summary was printed and given to the patient.  Yetta Flock [facility] [phone]

## 2022-12-08 ENCOUNTER — Other Ambulatory Visit: Payer: Self-pay | Admitting: Physician Assistant

## 2022-12-08 DIAGNOSIS — E1142 Type 2 diabetes mellitus with diabetic polyneuropathy: Secondary | ICD-10-CM

## 2022-12-08 LAB — CBC WITH DIFFERENTIAL/PLATELET
Basophils Absolute: 0 10*3/uL (ref 0.0–0.2)
Basos: 1 %
EOS (ABSOLUTE): 0.1 10*3/uL (ref 0.0–0.4)
Eos: 2 %
Hematocrit: 48.4 % (ref 37.5–51.0)
Hemoglobin: 16.2 g/dL (ref 13.0–17.7)
Immature Grans (Abs): 0 10*3/uL (ref 0.0–0.1)
Immature Granulocytes: 0 %
Lymphocytes Absolute: 1.1 10*3/uL (ref 0.7–3.1)
Lymphs: 16 %
MCH: 29.9 pg (ref 26.6–33.0)
MCHC: 33.5 g/dL (ref 31.5–35.7)
MCV: 90 fL (ref 79–97)
Monocytes Absolute: 0.4 10*3/uL (ref 0.1–0.9)
Monocytes: 7 %
Neutrophils Absolute: 4.8 10*3/uL (ref 1.4–7.0)
Neutrophils: 74 %
Platelets: 203 10*3/uL (ref 150–450)
RBC: 5.41 x10E6/uL (ref 4.14–5.80)
RDW: 11.9 % (ref 11.6–15.4)
WBC: 6.5 10*3/uL (ref 3.4–10.8)

## 2022-12-08 LAB — TSH: TSH: 1.56 u[IU]/mL (ref 0.450–4.500)

## 2022-12-08 LAB — LIPID PANEL
Chol/HDL Ratio: 2.9 ratio (ref 0.0–5.0)
Cholesterol, Total: 141 mg/dL (ref 100–199)
HDL: 48 mg/dL (ref >39)
LDL Chol Calc (NIH): 71 mg/dL (ref 0–99)
Triglycerides: 125 mg/dL (ref 0–149)
VLDL Cholesterol Cal: 22 mg/dL (ref 5–40)

## 2022-12-08 LAB — HEMOGLOBIN A1C
Est. average glucose Bld gHb Est-mCnc: 192 mg/dL
Hgb A1c MFr Bld: 8.3 % — ABNORMAL HIGH (ref 4.8–5.6)

## 2022-12-08 LAB — COMPREHENSIVE METABOLIC PANEL
ALT: 66 IU/L — ABNORMAL HIGH (ref 0–44)
AST: 51 IU/L — ABNORMAL HIGH (ref 0–40)
Albumin/Globulin Ratio: 2.4 — ABNORMAL HIGH (ref 1.2–2.2)
Albumin: 5.1 g/dL — ABNORMAL HIGH (ref 3.8–4.9)
Alkaline Phosphatase: 69 IU/L (ref 44–121)
BUN/Creatinine Ratio: 16 (ref 9–20)
BUN: 18 mg/dL (ref 6–24)
Bilirubin Total: 0.6 mg/dL (ref 0.0–1.2)
CO2: 27 mmol/L (ref 20–29)
Calcium: 10 mg/dL (ref 8.7–10.2)
Chloride: 95 mmol/L — ABNORMAL LOW (ref 96–106)
Creatinine, Ser: 1.16 mg/dL (ref 0.76–1.27)
Globulin, Total: 2.1 g/dL (ref 1.5–4.5)
Glucose: 151 mg/dL — ABNORMAL HIGH (ref 70–99)
Potassium: 4.4 mmol/L (ref 3.5–5.2)
Sodium: 139 mmol/L (ref 134–144)
Total Protein: 7.2 g/dL (ref 6.0–8.5)
eGFR: 73 mL/min/{1.73_m2} (ref >59)

## 2022-12-08 LAB — CARDIOVASCULAR RISK ASSESSMENT

## 2022-12-08 LAB — PSA: Prostate Specific Ag, Serum: 0.9 ng/mL (ref 0.0–4.0)

## 2022-12-08 LAB — MICROALBUMIN / CREATININE URINE RATIO
Creatinine, Urine: 23.2 mg/dL
Microalb/Creat Ratio: 110 mg/g creat — ABNORMAL HIGH (ref 0–29)
Microalbumin, Urine: 25.5 ug/mL

## 2022-12-08 MED ORDER — TRULICITY 3 MG/0.5ML ~~LOC~~ SOAJ: 3.0000 mg | SUBCUTANEOUS | 2 refills | Status: DC

## 2022-12-09 ENCOUNTER — Telehealth: Payer: Self-pay

## 2022-12-09 NOTE — Telephone Encounter (Signed)
Patient insurnace is not covering trulicity, they will cover Januvia, Lantus SoloStar, Metformin HCL,Soliqua, and Xultopy without PA. Please Advise.

## 2022-12-10 NOTE — Telephone Encounter (Signed)
Patient made aware PA was approved.

## 2022-12-11 ENCOUNTER — Other Ambulatory Visit: Payer: Self-pay

## 2022-12-11 DIAGNOSIS — J449 Chronic obstructive pulmonary disease, unspecified: Secondary | ICD-10-CM

## 2022-12-11 MED ORDER — ANORO ELLIPTA 62.5-25 MCG/ACT IN AEPB: 1.0000 | INHALATION_SPRAY | Freq: Every day | RESPIRATORY_TRACT | 0 refills | Status: DC

## 2022-12-14 ENCOUNTER — Other Ambulatory Visit: Payer: Self-pay

## 2022-12-14 NOTE — Telephone Encounter (Signed)
Patient called stating [facility] and [facility] do not have trulicity, advised to contact some other local pharmacies, if unable to find any to contact our office.

## 2022-12-17 ENCOUNTER — Other Ambulatory Visit: Payer: Self-pay

## 2022-12-17 DIAGNOSIS — J449 Chronic obstructive pulmonary disease, unspecified: Secondary | ICD-10-CM

## 2022-12-17 MED ORDER — ANORO ELLIPTA 62.5-25 MCG/ACT IN AEPB: 1.0000 | INHALATION_SPRAY | Freq: Every day | RESPIRATORY_TRACT | 0 refills | Status: DC

## 2022-12-25 ENCOUNTER — Other Ambulatory Visit: Payer: Self-pay

## 2022-12-25 MED ORDER — ZOLPIDEM TARTRATE 10 MG PO TABS: 10.0000 mg | ORAL_TABLET | Freq: Every day | ORAL | 0 refills | Status: DC

## 2022-12-28 ENCOUNTER — Other Ambulatory Visit: Payer: Self-pay | Admitting: Physician Assistant

## 2022-12-28 MED ORDER — FUROSEMIDE 20 MG PO TABS: 20.0000 mg | ORAL_TABLET | Freq: Every day | ORAL | 1 refills | Status: DC

## 2022-12-30 ENCOUNTER — Other Ambulatory Visit: Payer: Self-pay | Admitting: Physician Assistant

## 2022-12-30 MED ORDER — FUROSEMIDE 20 MG PO TABS: 20.0000 mg | ORAL_TABLET | Freq: Every day | ORAL | 1 refills | Status: DC

## 2022-12-31 ENCOUNTER — Other Ambulatory Visit: Payer: Self-pay | Admitting: Physician Assistant

## 2022-12-31 ENCOUNTER — Telehealth: Payer: Self-pay

## 2022-12-31 DIAGNOSIS — Z419 Encounter for procedure for purposes other than remedying health state, unspecified: Secondary | ICD-10-CM | POA: Diagnosis not present

## 2022-12-31 DIAGNOSIS — F5102 Adjustment insomnia: Secondary | ICD-10-CM

## 2022-12-31 MED ORDER — ZALEPLON 5 MG PO CAPS: 5.0000 mg | ORAL_CAPSULE | Freq: Every evening | ORAL | 0 refills | Status: DC | PRN

## 2022-12-31 NOTE — Telephone Encounter (Signed)
Called patient, we got a PA from insurance saying they don't cover his zolpidem 10 mg and provider wanted to know if patient has ever tried anything else. They suggest patient to try Zaleplon 10 mg or Eszopiclone 3 mg.

## 2023-01-04 ENCOUNTER — Other Ambulatory Visit: Payer: Self-pay

## 2023-01-04 DIAGNOSIS — F5102 Adjustment insomnia: Secondary | ICD-10-CM

## 2023-01-11 ENCOUNTER — Telehealth: Payer: Self-pay

## 2023-01-11 NOTE — Telephone Encounter (Signed)
   Name: Marcus Hicks  DOB: 11-19-1966  MRN: [id number]  Primary Cardiologist: None   Preoperative team, please contact this patient and set up a phone call appointment for further preoperative risk assessment. Please obtain consent and complete medication review. Thank you for your help.  I confirm that guidance regarding antiplatelet and oral anticoagulation therapy has been completed and, if necessary, noted below.  No medications requested   Mable Fill, Marissa Nestle, NP 01/11/2023, 5:57 PM [place]

## 2023-01-11 NOTE — Telephone Encounter (Signed)
   Pre-operative Risk Assessment    Patient Name: [name]  DOB: 1965-12-12 MRN: [id number]      Request for Surgical Clearance    Procedure:   Spine Injections L4-S1 facet injection  Date of Surgery:  Clearance TBD                                 Surgeon:  Dr Satira Mccallum Group or Practice Name:  [facility] Phone number:  [phone] Fax number:  [phone]   Type of Clearance Requested:   - Medical    Type of Anesthesia:  None    Additional requests/questions:  Please fax a copy of pre-op request form to the surgeon's office.  Signed, Toni Arthurs   01/11/2023, 5:22 PM

## 2023-01-12 ENCOUNTER — Telehealth: Payer: Self-pay | Admitting: *Deleted

## 2023-01-12 NOTE — Telephone Encounter (Signed)
I s/w the pt and he is agreeable to plan of care for tele pre op appt 01/18/23 @ 10:20. Med rec and consent are done.    Pt is on ASA, this will need to be held for his procedure.

## 2023-01-12 NOTE — Telephone Encounter (Signed)
I s/w the pt and he is agreeable to plan of care for tele pre op appt 01/18/23 @ 10:20. Med rec and consent are done.   Pt is on ASA, this will need to be held for his procedure.     Patient Consent for Virtual Visit        Marcus Hicks has provided verbal consent on 01/12/2023 for a virtual visit (video or telephone).   CONSENT FOR VIRTUAL VISIT FOR:  Marcus Hicks  By participating in this virtual visit I agree to the following:  I hereby voluntarily request, consent and authorize [facility] and its employed or contracted physicians, physician assistants, nurse practitioners or other licensed health care professionals (the Practitioner), to provide me with telemedicine health care services (the "Services") as deemed necessary by the treating Practitioner. I acknowledge and consent to receive the Services by the Practitioner via telemedicine. I understand that the telemedicine visit will involve communicating with the Practitioner through live audiovisual communication technology and the disclosure of certain medical information by electronic transmission. I acknowledge that I have been given the opportunity to request an in-person assessment or other available alternative prior to the telemedicine visit and am voluntarily participating in the telemedicine visit.  I understand that I have the right to withhold or withdraw my consent to the use of telemedicine in the course of my care at any time, without affecting my right to future care or treatment, and that the Practitioner or I may terminate the telemedicine visit at any time. I understand that I have the right to inspect all information obtained and/or recorded in the course of the telemedicine visit and may receive copies of available information for a reasonable fee.  I understand that some of the potential risks of receiving the Services via telemedicine include:  Delay or interruption in medical evaluation due to  technological equipment failure or disruption; Information transmitted may not be sufficient (e.g. poor resolution of images) to allow for appropriate medical decision making by the Practitioner; and/or  In rare instances, security protocols could fail, causing a breach of personal health information.  Furthermore, I acknowledge that it is my responsibility to provide information about my medical history, conditions and care that is complete and accurate to the best of my ability. I acknowledge that Practitioner's advice, recommendations, and/or decision may be based on factors not within their control, such as incomplete or inaccurate data provided by me or distortions of diagnostic images or specimens that may result from electronic transmissions. I understand that the practice of medicine is not an exact science and that Practitioner makes no warranties or guarantees regarding treatment outcomes. I acknowledge that a copy of this consent can be made available to me via my patient portal ([place] [facility]), or I can request a printed copy by calling the office of [facility].    I understand that my insurance will be billed for this visit.   I have read or had this consent read to me. I understand the contents of this consent, which adequately explains the benefits and risks of the Services being provided via telemedicine.  I have been provided ample opportunity to ask questions regarding this consent and the Services and have had my questions answered to my satisfaction. I give my informed consent for the services to be provided through the use of telemedicine in my medical care

## 2023-01-17 NOTE — Progress Notes (Unsigned)
Virtual Visit via Telephone Note   Because of Marcus Hicks's co-morbid illnesses, he is at least at moderate risk for complications without adequate follow up.  This format is felt to be most appropriate for this patient at this time.  The patient did not have access to video technology/had technical difficulties with video requiring transitioning to audio format only (telephone).  All issues noted in this document were discussed and addressed.  No physical exam could be performed with this format.  Please refer to the patient's chart for his consent to telehealth for [facility].  Evaluation Performed:  Preoperative cardiovascular risk assessment _____________   Date:  01/18/2023   Patient ID:  Marcus Hicks, DOB [birth year], MRN [id number] Patient Location:  Home Provider location:   Office  Primary Care Provider:  Marge Duncans, PA-C Primary Cardiologist:  Shirlee More, MD  Chief Complaint / Patient Profile   57 y.o. y/o male with a h/o atypical atrial flutter not on anticoagulation 2/2 left atrial appendage occlusion along with aortic valve replacement and Maze procedure 10/28/21, cirrhosis, hypertensive heart disease with heart failure, HLD, and COPD who is pending spine injections L4-S1 facet injection and presents today for telephonic preoperative cardiovascular risk assessment.  History of Present Illness    Marcus Hicks is a 57 y.o. male who presents via audio/video conferencing for a telehealth visit today.  Pt was last seen in cardiology clinic on 09/30/22 by Dr. Bettina Gavia.  At that time Arjunreddy Balthaser was doing well.  The patient is now pending procedure as outlined above. Since his last visit, he  denies chest pain, shortness of breath, lower extremity edema, fatigue, palpitations, melena, hematuria, hemoptysis, diaphoresis, weakness, presyncope, syncope, orthopnea, and PND. Reports his resting heart rate is always > 100 bpm. He sometimes has  presyncope when he gets overheated. Happened most recently while sitting in church.    Past Medical History    Past Medical History:  Diagnosis Date   Accretions on teeth 09/22/2021   Acquired thrombophilia ([place]) 08/11/2021   Acute kidney injury ([place]) 06/01/2021   Atrial fibrillation with rapid ventricular response ([place]) 08/12/2020   Bicuspid aortic valve    BMI 38.0-38.9,adult 11/26/2020   Bruxism (teeth grinding) 09/22/2021   Caries 09/22/2021   CHF (congestive heart failure) (HCC)    Chronic hepatitis C virus infection ([place]) 08/03/2013   Chronic periodontitis 09/22/2021   Cirrhosis of liver without ascites, unspecified hepatic cirrhosis type ([place]) 12/25/2021   Clinical xerostomia 09/22/2021   COPD (chronic obstructive pulmonary disease) ([place])    Coronary artery disease    COVID-19 virus infection 06/01/2021   Diabetes mellitus type 2, insulin dependent ([place])    Diastema 09/22/2021   Dyslipidemia 07/03/2015   Dysrhythmia    A Fib   Encounter for follow-up for aortic valve replacement 12/25/2021   Encounter for preoperative dental examination 09/22/2021   Essential hypertension 07/03/2015   Last Assessment & Plan:  Blood pressure is borderline high.  Advised to get better sleep, walk on a regular basis, decrease weight down to about 210 and maintain, and eliminate tobacco.   Excessive dental attrition 09/22/2021   HFrEF (heart failure with reduced ejection fraction) ([place]) 08/22/2020   History of maze procedure 03/31/2022   Hyperactive gag reflex 09/22/2021   Hyperlipidemia 10/12/2016   Impacted third molar tooth 09/22/2021   Incipient enamel caries 09/22/2021   Long term (current) use of anticoagulants 09/22/2021   Long-term insulin use ([place]) 10/12/2016   Morbid obesity (  [place]) 10/12/2016   Patient advised to lose down to about 210 pounds and maintained.  Regular walking program recommended.  He knows how to lose weight.   Obesity, unspecified 10/12/2016   Patient advised to lose down  to about 210 pounds and maintained.  Regular walking program recommended.  He knows how to lose weight.   Phobia of dental procedure 09/22/2021   Routine general medical examination at a health care facility 01/28/2021   S/P AVR (aortic valve replacement) and aortoplasty 10/28/2021   Sleep apnea    Teeth missing 09/22/2021   Thoracic aortic aneurysm without rupture, unspecified part ([place]) 03/31/2022   Type 2 diabetes mellitus with diabetic polyneuropathy ([place]) 10/12/2016   Past Surgical History:  Procedure Laterality Date   AORTIC ROOT ENLARGEMENT  10/28/2021   Procedure: AORTIC ROOT ENLARGEMENT WITH HEMASHIELD PLATINUM 28MM X 30CM;  Surgeon: Lajuana Matte, MD;  Location: [place];  Service: Open Heart Surgery;;   AORTIC VALVE REPLACEMENT N/A 10/28/2021   Procedure: AORTIC VALVE REPLACEMENT (AVR) WITH INSPIRIS RESILIA AORTIC VALVE SIZE 25MM;  Surgeon: Lajuana Matte, MD;  Location: [place];  Service: Open Heart Surgery;  Laterality: N/A;   CARDIAC CATHETERIZATION     CARDIOVERSION N/A 09/24/2020   Procedure: CARDIOVERSION;  Surgeon: Donato [name], MD;  Location: [facility] ENDOSCOPY;  Service: Cardiovascular;  Laterality: N/A;   CARDIOVERSION N/A 08/13/2021   Procedure: CARDIOVERSION;  Surgeon: Elouise Munroe, MD;  Location: [facility] ENDOSCOPY;  Service: Cardiovascular;  Laterality: N/A;   CARDIOVERSION N/A 02/26/2022   Procedure: CARDIOVERSION;  Surgeon: Berniece Salines, DO;  Location: [place];  Service: Cardiovascular;  Laterality: N/A;   CCTA  08/2019   CLIPPING OF ATRIAL APPENDAGE N/A 10/28/2021   Procedure: CLIPPING OF ATRIAL APPENDAGE WITH ATRICLIP ACHV45;  Surgeon: Lajuana Matte, MD;  Location: [place];  Service: Open Heart Surgery;  Laterality: N/A;   MAZE N/A 10/28/2021   Procedure: MAZE;  Surgeon: Lajuana Matte, MD;  Location: [place];  Service: Open Heart Surgery;  Laterality: N/A;   RIGHT/LEFT HEART CATH AND CORONARY ANGIOGRAPHY N/A 09/26/2021   Procedure:  RIGHT/LEFT HEART CATH AND CORONARY ANGIOGRAPHY;  Surgeon: Martinique, Peter M, MD;  Location: [place] CV LAB;  Service: Cardiovascular;  Laterality: N/A;   TEE WITHOUT CARDIOVERSION  09/24/2020   Procedure: TRANSESOPHAGEAL ECHOCARDIOGRAM (TEE);  Surgeon: Donato [name], MD;  Location: [place];  Service: Cardiovascular;;   TEE WITHOUT CARDIOVERSION N/A 10/28/2021   Procedure: TRANSESOPHAGEAL ECHOCARDIOGRAM (TEE);  Surgeon: Lajuana Matte, MD;  Location: [place];  Service: Open Heart Surgery;  Laterality: N/A;    Allergies  No Known Allergies  Home Medications    Prior to Admission medications   Medication Sig Start Date End Date Taking? Authorizing Provider  Dulaglutide (TRULICITY) 3 0000000 SOPN Inject 3 mg as directed once a week. 12/08/22   Marge Duncans, PA-C  zaleplon (SONATA) 5 MG capsule Take 1 capsule (5 mg total) by mouth at bedtime as needed for sleep. 12/31/22   Marge Duncans, PA-C  acetaminophen (TYLENOL) 500 MG tablet Take 500 mg by mouth every 6 (six) hours as needed for mild pain or fever.    [provider]  albuterol (PROVENTIL) (2.5 MG/3ML) 0.083% nebulizer solution Take 3 mLs (2.5 mg total) by nebulization every 6 (six) hours as needed for wheezing. 11/04/21   Gold, Wilder Glade, PA-C  ALPRAZolam (XANAX) 0.5 MG tablet TAKE 1 TABLET(0.5 MG) BY MOUTH DAILY AS NEEDED FOR ANXIETY 12/07/22   Marge Duncans,  PA-C  amiodarone (PACERONE) 200 MG tablet Take 100 mg by mouth daily.    [provider]  aspirin EC 81 MG tablet Take 1 tablet (81 mg total) by mouth daily. Swallow whole. 12/07/22   Marge Duncans, PA-C  furosemide (LASIX) 20 MG tablet Take 1 tablet (20 mg total) by mouth daily. 12/30/22   Marge Duncans, PA-C  Insulin Pen Needle (BD PEN NEEDLE NANO 2ND GEN) 32G X 4 MM MISC USE ONCE DAILY 11/11/22   Lillard Anes, MD  losartan (COZAAR) 50 MG tablet TAKE 1 TABLET(50 MG) BY MOUTH DAILY 10/01/22   Lillard Anes, MD  metoprolol tartrate (LOPRESSOR) 25  MG tablet TAKE 1 TABLET(25 MG) BY MOUTH TWICE DAILY 08/06/22   Lillard Anes, MD  Microlet Lancets MISC 1 each by Does not apply route daily in the afternoon. 12/07/22   Marge Duncans, PA-C  potassium chloride SA (KLOR-CON M) 20 MEQ tablet Take 1 tablet (20 mEq total) by mouth 2 (two) times daily. 06/11/22   Richardo Priest, MD  rosuvastatin (CRESTOR) 40 MG tablet TAKE 1 TABLET(40 MG) BY MOUTH DAILY 08/31/22   Lillard Anes, MD  umeclidinium-vilanterol [facility] ELLIPTA) 62.5-25 MCG/ACT AEPB Inhale 1 puff into the lungs daily. 12/17/22   Marge Duncans, PA-C    Physical Exam    Vital Signs:  Evart Ki does not have vital signs available for review today.  Given telephonic nature of communication, physical exam is limited. AAOx3. NAD. Normal affect.  Speech and respirations are unlabored.  Accessory Clinical Findings    None  Assessment & Plan    1.  Preoperative Cardiovascular Risk Assessment: The patient is doing well from a cardiac perspective. Therefore, based on ACC/AHA guidelines, the patient would be at acceptable risk for the planned procedure without further cardiovascular testing. According to the Revised Cardiac Risk Index (RCRI), his Perioperative Risk of Major Cardiac Event is (%): 0.9. His Functional Capacity in METs is: 7.59 according to the Duke Activity Status Index (DASI).  The patient was advised that if he develops new symptoms prior to surgery to contact our office to arrange for a follow-up visit, and he verbalized understanding.  Per office protocol, aspirin may be held for 5-7 days prior to procedure and should be resumed as soon as hemodynamically stable in the postoperative period.   A copy of this note will be routed to requesting surgeon.  Time:   Today, I have spent 10 minutes with the patient with telehealth technology discussing medical history, symptoms, and management plan.     Emmaline Life, NP-C  01/18/2023, 10:19 AM [address], [address] Office [phone] Fax [phone]

## 2023-01-18 ENCOUNTER — Ambulatory Visit: Payer: 59 | Attending: Cardiovascular Disease | Admitting: Nurse Practitioner

## 2023-01-18 ENCOUNTER — Encounter: Payer: Self-pay | Admitting: Nurse Practitioner

## 2023-01-18 DIAGNOSIS — Z01810 Encounter for preprocedural cardiovascular examination: Secondary | ICD-10-CM | POA: Diagnosis not present

## 2023-01-19 ENCOUNTER — Telehealth: Payer: Self-pay | Admitting: Cardiology

## 2023-01-19 NOTE — Telephone Encounter (Signed)
Pt would like a callback regarding letter to be excused from jury duty on 3/18 due to upcoming Ablation. Pt's PCP will be sending fax regarding jury duty today. Please advise.

## 2023-01-19 NOTE — Telephone Encounter (Signed)
Left message to call back  

## 2023-01-20 ENCOUNTER — Telehealth: Payer: Self-pay | Admitting: Cardiology

## 2023-01-20 ENCOUNTER — Encounter: Payer: Self-pay | Admitting: *Deleted

## 2023-01-20 NOTE — Telephone Encounter (Signed)
Re-faxed the surgical clearance note over to [place] at [phone]

## 2023-01-20 NOTE — Telephone Encounter (Signed)
Pt informs me that he has been summoned for possible jury duty - superior court the week of his upcoming ablation next month. Aware I will form a letter for him.  Explained I would get MD signature and fax to [facility] office for him to pick up tomorrow/next week. Patient verbalized understanding and agreeable to plan.

## 2023-01-20 NOTE — Telephone Encounter (Signed)
Follow Up:    Marcus Hicks is calling to check on the status of patient's clearance. Please fax this asap to [phone].

## 2023-01-21 ENCOUNTER — Telehealth: Payer: Self-pay | Admitting: *Deleted

## 2023-01-21 NOTE — Telephone Encounter (Signed)
Pt reported yesterday that his hands and feet were cold/purple and noticed his back starting to turn purple as well. Pt informed that I would discuss this with Dr. Curt Bears today.  Reviewed w/ MD.   Advised to stop the Amiodarone. Advised to call the office if fluttering/elevated HRs occur after stopping the medication. Pt is very relieved and excited to finally stop this medication d/t these SE. He is agreeable to plan.

## 2023-01-29 DIAGNOSIS — Z419 Encounter for procedure for purposes other than remedying health state, unspecified: Secondary | ICD-10-CM | POA: Diagnosis not present

## 2023-02-01 ENCOUNTER — Telehealth: Payer: Self-pay | Admitting: Cardiology

## 2023-02-01 ENCOUNTER — Encounter: Payer: Self-pay | Admitting: *Deleted

## 2023-02-01 ENCOUNTER — Ambulatory Visit: Payer: 59 | Attending: Cardiology | Admitting: Cardiology

## 2023-02-01 ENCOUNTER — Encounter: Payer: Self-pay | Admitting: Cardiology

## 2023-02-01 VITALS — BP 148/80 | HR 104 | Ht 65.0 in | Wt 210.0 lb

## 2023-02-01 DIAGNOSIS — I4819 Other persistent atrial fibrillation: Secondary | ICD-10-CM | POA: Diagnosis not present

## 2023-02-01 DIAGNOSIS — D6869 Other thrombophilia: Secondary | ICD-10-CM

## 2023-02-01 DIAGNOSIS — I484 Atypical atrial flutter: Secondary | ICD-10-CM | POA: Diagnosis not present

## 2023-02-01 DIAGNOSIS — Z01812 Encounter for preprocedural laboratory examination: Secondary | ICD-10-CM | POA: Diagnosis not present

## 2023-02-01 MED ORDER — APIXABAN 5 MG PO TABS: 5.0000 mg | ORAL_TABLET | Freq: Two times a day (BID) | ORAL | 6 refills | Status: DC

## 2023-02-01 NOTE — Telephone Encounter (Signed)
Pt aware I would discuss w/ MD and let him know tomorrow. Patient verbalized understanding and agreeable to plan.

## 2023-02-01 NOTE — H&P (View-Only) (Signed)
 Electrophysiology Office Note   Date:  02/01/2023   ID:  Baylen Reid Gorelik, DOB 06/19/1966, MRN [id number]  PCP:  Davis, Sara, PA-C  Cardiologist:  [place] Primary Electrophysiologist:  Edwena Mayorga Martin Charizma Gardiner, MD    Chief Complaint: AF   History of Present Illness: Marcus Hicks is a 57 y.o. male who is being seen today for the evaluation of AF at the request of Davis, Sara, PA-C. Presenting today for electrophysiology evaluation.  He has a history significant for persistent atrial fibrillation, heart failure with reduced ejection fraction, coronary artery disease, cirrhosis, thoracic aortic aneurysm and bicuspid aortic valve.  Aortic valve replacement, surgical maze, left atrial appendage clipping 10/28/2021.  Ejection fraction normalized.  He presented to his general cardiologist in 2-1 atrial flutter versus atrial tachycardia.  Amiodarone was increased.  Today, denies symptoms of palpitations, chest pain, shortness of breath, orthopnea, PND, lower extremity edema, claudication, dizziness, presyncope, syncope, bleeding, or neurologic sequela. The patient is tolerating medications without difficulties.  He is doing well.  He unfortunately remains in atrial flutter.  He had to stop his amiodarone as he was having a bluish tinge to his skin.  He is ready for ablation.    Past Medical History:  Diagnosis Date   Accretions on teeth 09/22/2021   Acquired thrombophilia (HCC) 08/11/2021   Acute kidney injury (HCC) 06/01/2021   Atrial fibrillation with rapid ventricular response (HCC) 08/12/2020   Bicuspid aortic valve    BMI 38.0-38.9,adult 11/26/2020   Bruxism (teeth grinding) 09/22/2021   Caries 09/22/2021   CHF (congestive heart failure) (HCC)    Chronic hepatitis C virus infection (HCC) 08/03/2013   Chronic periodontitis 09/22/2021   Cirrhosis of liver without ascites, unspecified hepatic cirrhosis type (HCC) 12/25/2021   Clinical xerostomia 09/22/2021   COPD (chronic  obstructive pulmonary disease) (HCC)    Coronary artery disease    COVID-19 virus infection 06/01/2021   Diabetes mellitus type 2, insulin dependent (HCC)    Diastema 09/22/2021   Dyslipidemia 07/03/2015   Dysrhythmia    A Fib   Encounter for follow-up for aortic valve replacement 12/25/2021   Encounter for preoperative dental examination 09/22/2021   Essential hypertension 07/03/2015   Last Assessment & Plan:  Blood pressure is borderline high.  Advised to get better sleep, walk on a regular basis, decrease weight down to about 210 and maintain, and eliminate tobacco.   Excessive dental attrition 09/22/2021   HFrEF (heart failure with reduced ejection fraction) (HCC) 08/22/2020   History of maze procedure 03/31/2022   Hyperactive gag reflex 09/22/2021   Hyperlipidemia 10/12/2016   Impacted third molar tooth 09/22/2021   Incipient enamel caries 09/22/2021   Long term (current) use of anticoagulants 09/22/2021   Long-term insulin use (HCC) 10/12/2016   Morbid obesity (HCC) 10/12/2016   Patient advised to lose down to about 210 pounds and maintained.  Regular walking program recommended.  He knows how to lose weight.   Obesity, unspecified 10/12/2016   Patient advised to lose down to about 210 pounds and maintained.  Regular walking program recommended.  He knows how to lose weight.   Phobia of dental procedure 09/22/2021   Routine general medical examination at a health care facility 01/28/2021   S/P AVR (aortic valve replacement) and aortoplasty 10/28/2021   Sleep apnea    Teeth missing 09/22/2021   Thoracic aortic aneurysm without rupture, unspecified part (HCC) 03/31/2022   Type 2 diabetes mellitus with diabetic polyneuropathy (HCC) 10/12/2016   Past Surgical History:    Procedure Laterality Date   AORTIC ROOT ENLARGEMENT  10/28/2021   Procedure: AORTIC ROOT ENLARGEMENT WITH HEMASHIELD PLATINUM 28MM X 30CM;  Surgeon: Lightfoot, Harrell O, MD;  [facility];  Service: Open Heart Surgery;;    AORTIC VALVE REPLACEMENT N/A 10/28/2021   Procedure: AORTIC VALVE REPLACEMENT (AVR) WITH INSPIRIS RESILIA AORTIC VALVE SIZE 25MM;  Surgeon: Lightfoot, Harrell O, MD;  [facility];  Service: Open Heart Surgery;  Laterality: N/A;   CARDIAC CATHETERIZATION     CARDIOVERSION N/A 09/24/2020   Procedure: CARDIOVERSION;  Surgeon: Schumann, Christopher L, MD;  [facility] ENDOSCOPY;  Service: Cardiovascular;  Laterality: N/A;   CARDIOVERSION N/A 08/13/2021   Procedure: CARDIOVERSION;  Surgeon: Acharya, Gayatri A, MD;  [facility] ENDOSCOPY;  Service: Cardiovascular;  Laterality: N/A;   CARDIOVERSION N/A 02/26/2022   Procedure: CARDIOVERSION;  Surgeon: Tobb, Kardie, DO;  [facility] ENDOSCOPY;  Service: Cardiovascular;  Laterality: N/A;   CCTA  08/2019   CLIPPING OF ATRIAL APPENDAGE N/A 10/28/2021   Procedure: CLIPPING OF ATRIAL APPENDAGE WITH ATRICLIP ACHV45;  Surgeon: Lightfoot, Harrell O, MD;  [facility];  Service: Open Heart Surgery;  Laterality: N/A;   MAZE N/A 10/28/2021   Procedure: MAZE;  Surgeon: Lightfoot, Harrell O, MD;  [facility];  Service: Open Heart Surgery;  Laterality: N/A;   RIGHT/LEFT HEART CATH AND CORONARY ANGIOGRAPHY N/A 09/26/2021   Procedure: RIGHT/LEFT HEART CATH AND CORONARY ANGIOGRAPHY;  Surgeon: Jordan, Peter M, MD;  [facility] INVASIVE CV LAB;  Service: Cardiovascular;  Laterality: N/A;   TEE WITHOUT CARDIOVERSION  09/24/2020   Procedure: TRANSESOPHAGEAL ECHOCARDIOGRAM (TEE);  Surgeon: Schumann, Christopher L, MD;  [facility] ENDOS[facility];  Service: Cardiovascular;;   TEE WITHOUT CARDIOVERSION N/A 10/28/2021   Procedure: TRANSESOPHAGEAL ECHOCARDIOGRAM (TEE);  Surgeon: Lightfoot, Harrell O, MD;  [facility];  Service: Open Heart Surgery;  Laterality: N/A;     Current Outpatient Medications  Medication Sig Dispense Refill   acetaminophen (TYLENOL) 500 MG tablet Take 500 mg by mouth every 6 (six) hours as needed for mild pain or fever.     albuterol  (PROVENTIL) (2.5 MG/3ML) 0.083% nebulizer solution Take 3 mLs (2.5 mg total) by nebulization every 6 (six) hours as needed for wheezing.     ALPRAZolam (XANAX) 0.5 MG tablet TAKE 1 TABLET(0.5 MG) BY MOUTH DAILY AS NEEDED FOR ANXIETY 30 tablet 0   apixaban (ELIQUIS) 5 MG TABS tablet Take 1 tablet (5 mg total) by mouth 2 (two) times daily. 60 tablet 6   Dulaglutide (TRULICITY) 3 MG/0.5ML SOPN Inject 3 mg as directed once a week. 3 mL 2   furosemide (LASIX) 20 MG tablet Take 1 tablet (20 mg total) by mouth daily. 90 tablet 1   Insulin Pen Needle (BD PEN NEEDLE NANO 2ND GEN) 32G X 4 MM MISC USE ONCE DAILY 100 each 3   losartan (COZAAR) 50 MG tablet TAKE 1 TABLET(50 MG) BY MOUTH DAILY 90 tablet 2   metoprolol tartrate (LOPRESSOR) 25 MG tablet TAKE 1 TABLET(25 MG) BY MOUTH TWICE DAILY 180 tablet 2   Microlet Lancets MISC 1 each by Does not apply route daily in the afternoon. 100 each 2   potassium chloride SA (KLOR-CON M) 20 MEQ tablet Take 1 tablet (20 mEq total) by mouth 2 (two) times daily. 180 tablet 3   rosuvastatin (CRESTOR) 40 MG tablet TAKE 1 TABLET(40 MG) BY MOUTH DAILY 90 tablet 2   umeclidinium-vilanterol (ANORO ELLIPTA) 62.5-25 MCG/ACT AEPB Inhale 1 puff into the lungs daily. 60   each 0   zaleplon (SONATA) 5 MG capsule Take 1 capsule (5 mg total) by mouth at bedtime as needed for sleep. 30 capsule 0   amiodarone (PACERONE) 200 MG tablet Take 200 mg by mouth 2 (two) times daily. (Patient not taking: Reported on 02/01/2023)     No current facility-administered medications for this visit.    Allergies:   Patient has no known allergies.   Social History:  The patient  reports that he has quit smoking. He has been exposed to tobacco smoke. He has quit using smokeless tobacco.  His smokeless tobacco use included snuff. He reports that he does not currently use alcohol. He reports that he does not use drugs.   Family History:  The patient's family history includes Alzheimer's disease in an other  family member; Cancer in his maternal grandfather; Hypertension in an other family member; Liver disease in his sister; Memory loss in an other family member.   ROS:  Please see the history of present illness.   Otherwise, review of systems is positive for none.   All other systems are reviewed and negative.   PHYSICAL EXAM: VS:  BP (!) 148/80   Pulse (!) 104   Ht 5' 5" (1.651 m)   Wt 210 lb (95.3 kg)   SpO2 98%   BMI 34.95 kg/m  , BMI Body mass index is 34.95 kg/m. GEN: Well nourished, well developed, in no acute distress  HEENT: normal  Neck: no JVD, carotid bruits, or masses Cardiac: tachycardic; no murmurs, rubs, or gallops,no edema  Respiratory:  clear to auscultation bilaterally, normal work of breathing GI: soft, nontender, nondistended, + BS MS: no deformity or atrophy  Skin: warm and dry Neuro:  Strength and sensation are intact Psych: euthymic mood, full affect  EKG:  EKG is ordered today. Personal review of the ekg ordered shows atrial flutter   Recent Labs: 12/07/2022: ALT 66; BUN 18; Creatinine, Ser 1.16; Hemoglobin 16.2; Platelets 203; Potassium 4.4; Sodium 139; TSH 1.560    Lipid Panel     Component Value Date/Time   CHOL 141 12/07/2022 1450   TRIG 125 12/07/2022 1450   HDL 48 12/07/2022 1450   CHOLHDL 2.9 12/07/2022 1450   LDLCALC 71 12/07/2022 1450     Wt Readings from Last 3 Encounters:  02/01/23 210 lb (95.3 kg)  12/07/22 208 lb (94.3 kg)  10/28/22 205 lb (93 kg)      Other studies Reviewed: Additional studies/ records that were reviewed today include: TTE 01/14/22  Review of the above records today demonstrates:   1. Left ventricular ejection fraction, by estimation, is 50 to 55%. The  left ventricle has low normal function. The left ventricle has no regional  wall motion abnormalities. There is moderate concentric left ventricular  hypertrophy. Left ventricular  diastolic parameters are indeterminate.   2. Right ventricular systolic function  is mildly reduced. The right  ventricular size is normal. There is normal pulmonary artery systolic  pressure.   3. The mitral valve is normal in structure. No evidence of mitral valve  regurgitation. No evidence of mitral stenosis.   4. Aortic valve regurgitation is not visualized. There is a AORTIC VALVE  REPLACEMENT (AVR) WITH INSPIRIS RESILIA AORTIC VALVE SIZE 25MM (N/A) valve  present in the aortic position. Procedure Date: 10/28/2021. Echo findings  are consistent with normal  structure and function of the aortic valve prosthesis.   5. Aortic Normal DTA.   6. The inferior vena cava is dilated in   size with >50% respiratory  variability, suggesting right atrial pressure of 8 mmHg.    ASSESSMENT AND PLAN:  1.  Atrial fibrillation/atypical atrial flutter: Currently on amiodarone.  Not anticoagulated due to left atrial appendage clip.  CHA2DS2-VASc of at least 2.  Has plans for ablation 02/18/2023.  Zonya Gudger start Eliquis today and prep for ablation.  Risk, benefits, and alternatives to EP study and radiofrequency ablation for afib were also discussed in detail today. These risks include but are not limited to stroke, bleeding, vascular damage, tamponade, perforation, damage to the esophagus, lungs, and other structures, pulmonary vein stenosis, worsening renal function, and death. The patient understands these risk and wishes to proceed.  We Gearold Wainer therefore proceed with catheter ablation at the next available time.  Carto, ICE, anesthesia are requested for the procedure.  Alsie Younes also obtain CT PV protocol prior to the procedure to exclude LAA thrombus and further evaluate atrial anatomy.   2.  Bicuspid aortic valve: Post AVR.  Stable on most recent echo.  3.  Chronic systolic and diastolic heart failure: Ejection fraction is normalized.  Plan per primary cardiology  4.  Second hypercoagulable state: Post left atrial appendage clipping at the time of surgery   Current medicines are reviewed at  length with the patient today.   The patient does not have concerns regarding his medicines.  The following changes were made today: Start Eliquis  Labs/ tests ordered today include:  Orders Placed This Encounter  Procedures   Basic metabolic panel   CBC   EKG 12-Lead     Disposition:   FU 3 months  Signed, Mackenzie Groom Martin Abdoulaye Drum, MD  02/01/2023 2:34 PM     [facility] [address] [place] [address] [phone] (office) [phone] (fax) 

## 2023-02-01 NOTE — Telephone Encounter (Signed)
Spoke to [facility], Dr. Macky Lower nurse regarding this patient's question, and she asked to have the question forwarded to her.

## 2023-02-01 NOTE — Telephone Encounter (Signed)
Pt is having his back injection on march 6th and wants to make sure that is okay since he is having his ablation on March the 21st. Pt stated that was the soonest they could do his back injections but he's unsure if that would be okay since the nurse told him it needed to be two weeks before the ablation.

## 2023-02-01 NOTE — Patient Instructions (Addendum)
Medication Instructions:  Your physician has recommended you make the following change in your medication:  START Eliquis '5mg'$  twice daily STOP Aspirin  *If you need a refill on your cardiac medications before your next appointment, please call your pharmacy*   Lab Work: Pre procedure labs today:  BMP & CBC  If you have labs (blood work) drawn today and your tests are completely normal, you will receive your results only by: [place] (if you have MyChart) OR A paper copy in the mail If you have any lab test that is abnormal or we need to change your treatment, we will call you to review the results.   Testing/Procedures: Your physician has requested that you have cardiac CT within 7 days PRIOR to your ablation. Cardiac computed tomography (CT) is a painless test that uses an x-ray machine to take clear, detailed pictures of your heart.  Please follow instruction below located under "other instructions". You will get a call from our office to schedule the date for this test.  Your physician has recommended that you have an ablation. Catheter ablation is a medical procedure used to treat some cardiac arrhythmias (irregular heartbeats). During catheter ablation, a long, thin, flexible tube is put into a blood vessel in your groin (upper thigh), or neck. This tube is called an ablation catheter. It is then guided to your heart through the blood vessel. Radio frequency waves destroy small areas of heart tissue where abnormal heartbeats may cause an arrhythmia to start. Please follow instruction letter given to you today.   Follow-Up: At [facility], you and your health needs are our priority.  As part of our continuing mission to provide you with exceptional heart care, we have created designated Provider Care Teams.  These Care Teams include your primary Cardiologist (physician) and Advanced Practice Providers (APPs -  Physician Assistants and Nurse Practitioners) who all work together  to provide you with the care you need, when you need it.  Your next appointment:   1 month(s) after your ablation  The format for your next appointment:   In Person  Provider:   AFib clinic   Thank you for choosing [facility]!!   Trinidad Curet, RN [phone]    Other Instructions   Cardiac Ablation Cardiac ablation is a procedure to destroy (ablate) some heart tissue that is sending bad signals. These bad signals cause problems in heart rhythm. The heart has many areas that make these signals. If there are problems in these areas, they can make the heart beat in a way that is not normal. Destroying some tissues can help make the heart rhythm normal. Tell your doctor about: Any allergies you have. All medicines you are taking. These include vitamins, herbs, eye drops, creams, and over-the-counter medicines. Any problems you or family members have had with medicines that make you fall asleep (anesthetics). Any blood disorders you have. Any surgeries you have had. Any medical conditions you have, such as kidney failure. Whether you are pregnant or may be pregnant. What are the risks? This is a safe procedure. But problems may occur, including: Infection. Bruising and bleeding. Bleeding into the chest. Stroke or blood clots. Damage to nearby areas of your body. Allergies to medicines or dyes. The need for a pacemaker if the normal system is damaged. Failure of the procedure to treat the problem. What happens before the procedure? Medicines Ask your doctor about: Changing or stopping your normal medicines. This is important. Taking aspirin and ibuprofen. Do  not take these medicines unless your doctor tells you to take them. Taking other medicines, vitamins, herbs, and supplements. General instructions Follow instructions from your doctor about what you cannot eat or drink. Plan to have someone take you home from the hospital or clinic. If you will be going home  right after the procedure, plan to have someone with you for 24 hours. Ask your doctor what steps will be taken to prevent infection. What happens during the procedure?  An IV tube will be put into one of your veins. You will be given a medicine to help you relax. The skin on your neck or groin will be numbed. A cut (incision) will be made in your neck or groin. A needle will be put through your cut and into a large vein. A tube (catheter) will be put into the needle. The tube will be moved to your heart. Dye may be put through the tube. This helps your doctor see your heart. Small devices (electrodes) on the tube will send out signals. A type of energy will be used to destroy some heart tissue. The tube will be taken out. Pressure will be held on your cut. This helps stop bleeding. A bandage will be put over your cut. The exact procedure may vary among doctors and hospitals. What happens after the procedure? You will be watched until you leave the hospital or clinic. This includes checking your heart rate, breathing rate, oxygen, and blood pressure. Your cut will be watched for bleeding. You will need to lie still for a few hours. Do not drive for 24 hours or as long as your doctor tells you. Summary Cardiac ablation is a procedure to destroy some heart tissue. This is done to treat heart rhythm problems. Tell your doctor about any medical conditions you may have. Tell him or her about all medicines you are taking to treat them. This is a safe procedure. But problems may occur. These include infection, bruising, bleeding, and damage to nearby areas of your body. Follow what your doctor tells you about food and drink. You may also be told to change or stop some of your medicines. After the procedure, do not drive for 24 hours or as long as your doctor tells you. This information is not intended to replace advice given to you by your health care provider. Make sure you discuss any questions  you have with your health care provider. Document Revised: 02/06/2022 Document Reviewed: 10/19/2019 Elsevier Patient Education  [place].

## 2023-02-01 NOTE — Progress Notes (Signed)
Electrophysiology Office Note   Date:  02/01/2023   ID:  [name], [place] 09/14/1966, MRN [id number]  PCP:  Marge Duncans, PA-C  Cardiologist:  Bettina Gavia Primary Electrophysiologist:  Ammon Muscatello Meredith Leeds, MD    Chief Complaint: AF   History of Present Illness: Marcus Hicks is a 57 y.o. male who is being seen today for the evaluation of AF at the request of Marge Duncans, [place]. Presenting today for electrophysiology evaluation.  He has a history significant for persistent atrial fibrillation, heart failure with reduced ejection fraction, coronary artery disease, cirrhosis, thoracic aortic aneurysm and bicuspid aortic valve.  Aortic valve replacement, surgical maze, left atrial appendage clipping 10/28/2021.  Ejection fraction normalized.  He presented to his general cardiologist in 2-1 atrial flutter versus atrial tachycardia.  Amiodarone was increased.  Today, denies symptoms of palpitations, chest pain, shortness of breath, orthopnea, PND, lower extremity edema, claudication, dizziness, presyncope, syncope, bleeding, or neurologic sequela. The patient is tolerating medications without difficulties.  He is doing well.  He unfortunately remains in atrial flutter.  He had to stop his amiodarone as he was having a bluish tinge to his skin.  He is ready for ablation.    Past Medical History:  Diagnosis Date   Accretions on teeth 09/22/2021   Acquired thrombophilia ([place]) 08/11/2021   Acute kidney injury ([place]) 06/01/2021   Atrial fibrillation with rapid ventricular response ([place]) 08/12/2020   Bicuspid aortic valve    BMI 38.0-38.9,adult 11/26/2020   Bruxism (teeth grinding) 09/22/2021   [place] 09/22/2021   CHF (congestive heart failure) (HCC)    Chronic hepatitis C virus infection ([place]) 08/03/2013   Chronic periodontitis 09/22/2021   Cirrhosis of liver without ascites, unspecified hepatic cirrhosis type ([place]) 12/25/2021   Clinical xerostomia 09/22/2021   COPD (chronic  obstructive pulmonary disease) ([place])    Coronary artery disease    COVID-19 virus infection 06/01/2021   Diabetes mellitus type 2, insulin dependent ([place])    Diastema 09/22/2021   Dyslipidemia 07/03/2015   Dysrhythmia    A Fib   Encounter for follow-up for aortic valve replacement 12/25/2021   Encounter for preoperative dental examination 09/22/2021   Essential hypertension 07/03/2015   Last Assessment & Plan:  Blood pressure is borderline high.  Advised to get better sleep, walk on a regular basis, decrease weight down to about 210 and maintain, and eliminate tobacco.   Excessive dental attrition 09/22/2021   HFrEF (heart failure with reduced ejection fraction) ([place]) 08/22/2020   History of maze procedure 03/31/2022   Hyperactive gag reflex 09/22/2021   Hyperlipidemia 10/12/2016   Impacted third molar tooth 09/22/2021   Incipient enamel caries 09/22/2021   Long term (current) use of anticoagulants 09/22/2021   Long-term insulin use ([place]) 10/12/2016   Morbid obesity ([place]) 10/12/2016   Patient advised to lose down to about 210 pounds and maintained.  Regular walking program recommended.  He knows how to lose weight.   Obesity, unspecified 10/12/2016   Patient advised to lose down to about 210 pounds and maintained.  Regular walking program recommended.  He knows how to lose weight.   Phobia of dental procedure 09/22/2021   Routine general medical examination at a health care facility 01/28/2021   S/P AVR (aortic valve replacement) and aortoplasty 10/28/2021   Sleep apnea    Teeth missing 09/22/2021   Thoracic aortic aneurysm without rupture, unspecified part ([place]) 03/31/2022   Type 2 diabetes mellitus with diabetic polyneuropathy ([place]) 10/12/2016   Past Surgical History:  Procedure Laterality Date   AORTIC ROOT ENLARGEMENT  10/28/2021   Procedure: AORTIC ROOT ENLARGEMENT WITH HEMASHIELD PLATINUM 28MM X 30CM;  Surgeon: Lajuana Matte, MD;  Location: [facility];  Service: Open Heart Surgery;;    AORTIC VALVE REPLACEMENT N/A 10/28/2021   Procedure: AORTIC VALVE REPLACEMENT (AVR) WITH INSPIRIS RESILIA AORTIC VALVE SIZE 25MM;  Surgeon: Lajuana Matte, MD;  Location: [facility];  Service: Open Heart Surgery;  Laterality: N/A;   CARDIAC CATHETERIZATION     CARDIOVERSION N/A 09/24/2020   Procedure: CARDIOVERSION;  Surgeon: Donato [name], MD;  Location: [facility] ENDOSCOPY;  Service: Cardiovascular;  Laterality: N/A;   CARDIOVERSION N/A 08/13/2021   Procedure: CARDIOVERSION;  Surgeon: Elouise Munroe, MD;  Location[facility] ENDOSCOPY;  Service: Cardiovascular;  Laterality: N/A;   CARDIOVERSION N/A 02/26/2022   Procedure: CARDIOVERSION;  Surgeon: Berniece Salines, DO;  Location: [facility];  Service: Cardiovascular;  Laterality: N/A;   CCTA  08/2019   CLIPPING OF ATRIAL APPENDAGE N/A 10/28/2021   Procedure: CLIPPING OF ATRIAL APPENDAGE WITH ATRICLIP ACHV45;  Surgeon: Lajuana Matte, MD;  Location: [facility];  Service: Open Heart Surgery;  Laterality: N/A;   MAZE N/A 10/28/2021   Procedure: MAZE;  Surgeon: Lajuana Matte, MD;  Location: [facility];  Service: Open Heart Surgery;  Laterality: N/A;   RIGHT/LEFT HEART CATH AND CORONARY ANGIOGRAPHY N/A 09/26/2021   Procedure: RIGHT/LEFT HEART CATH AND CORONARY ANGIOGRAPHY;  Surgeon: Martinique, Peter M, MD;  Location: [place] CV LAB;  Service: Cardiovascular;  Laterality: N/A;   TEE WITHOUT CARDIOVERSION  09/24/2020   Procedure: TRANSESOPHAGEAL ECHOCARDIOGRAM (TEE);  Surgeon: Donato [name], MD;  Location: [facility];  Service: Cardiovascular;;   TEE WITHOUT CARDIOVERSION N/A 10/28/2021   Procedure: TRANSESOPHAGEAL ECHOCARDIOGRAM (TEE);  Surgeon: Lajuana Matte, MD;  Location: [facility];  Service: Open Heart Surgery;  Laterality: N/A;     Current Outpatient Medications  Medication Sig Dispense Refill   acetaminophen (TYLENOL) 500 MG tablet Take 500 mg by mouth every 6 (six) hours as needed for mild pain or fever.     albuterol  (PROVENTIL) (2.5 MG/3ML) 0.083% nebulizer solution Take 3 mLs (2.5 mg total) by nebulization every 6 (six) hours as needed for wheezing.     ALPRAZolam (XANAX) 0.5 MG tablet TAKE 1 TABLET(0.5 MG) BY MOUTH DAILY AS NEEDED FOR ANXIETY 30 tablet 0   apixaban (ELIQUIS) 5 MG TABS tablet Take 1 tablet (5 mg total) by mouth 2 (two) times daily. 60 tablet 6   Dulaglutide (TRULICITY) 3 0000000 SOPN Inject 3 mg as directed once a week. 3 mL 2   furosemide (LASIX) 20 MG tablet Take 1 tablet (20 mg total) by mouth daily. 90 tablet 1   Insulin Pen Needle (BD PEN NEEDLE NANO 2ND GEN) 32G X 4 MM MISC USE ONCE DAILY 100 each 3   losartan (COZAAR) 50 MG tablet TAKE 1 TABLET(50 MG) BY MOUTH DAILY 90 tablet 2   metoprolol tartrate (LOPRESSOR) 25 MG tablet TAKE 1 TABLET(25 MG) BY MOUTH TWICE DAILY 180 tablet 2   Microlet Lancets MISC 1 each by Does not apply route daily in the afternoon. 100 each 2   potassium chloride SA (KLOR-CON M) 20 MEQ tablet Take 1 tablet (20 mEq total) by mouth 2 (two) times daily. 180 tablet 3   rosuvastatin (CRESTOR) 40 MG tablet TAKE 1 TABLET(40 MG) BY MOUTH DAILY 90 tablet 2   umeclidinium-vilanterol (ANORO ELLIPTA) 62.5-25 MCG/ACT AEPB Inhale 1 puff into the lungs daily. [place]  each 0   zaleplon (SONATA) 5 MG capsule Take 1 capsule (5 mg total) by mouth at bedtime as needed for sleep. 30 capsule 0   amiodarone (PACERONE) 200 MG tablet Take 200 mg by mouth 2 (two) times daily. (Patient not taking: Reported on 02/01/2023)     No current facility-administered medications for this visit.    Allergies:   Patient has no known allergies.   Social History:  The patient  reports that he has quit smoking. He has been exposed to tobacco smoke. He has quit using smokeless tobacco.  His smokeless tobacco use included snuff. He reports that he does not currently use alcohol. He reports that he does not use drugs.   Family History:  The patient's family history includes Alzheimer's disease in an other  family member; Cancer in his maternal grandfather; Hypertension in an other family member; Liver disease in his sister; Memory loss in an other family member.   ROS:  Please see the history of present illness.   Otherwise, review of systems is positive for none.   All other systems are reviewed and negative.   PHYSICAL EXAM: VS:  BP (!) 148/80   Pulse (!) 104   Ht '5\' 5"'$  (1.651 m)   Wt 210 lb (95.3 kg)   SpO2 98%   BMI 34.95 kg/m  , BMI Body mass index is 34.95 kg/m. GEN: Well nourished, well developed, in no acute distress  HEENT: normal  Neck: no JVD, carotid bruits, or masses Cardiac: tachycardic; no murmurs, rubs, or gallops,no edema  Respiratory:  clear to auscultation bilaterally, normal work of breathing GI: soft, nontender, nondistended, + BS MS: no deformity or atrophy  Skin: warm and dry Neuro:  Strength and sensation are intact Psych: euthymic mood, full affect  EKG:  EKG is ordered today. Personal review of the ekg ordered shows atrial flutter   Recent Labs: 12/07/2022: ALT 66; BUN 18; Creatinine, Ser 1.16; Hemoglobin 16.2; Platelets 203; Potassium 4.4; Sodium 139; TSH 1.560    Lipid Panel     Component Value Date/Time   CHOL 141 12/07/2022 1450   TRIG 125 12/07/2022 1450   HDL 48 12/07/2022 1450   CHOLHDL 2.9 12/07/2022 1450   LDLCALC 71 12/07/2022 1450     Wt Readings from Last 3 Encounters:  02/01/23 210 lb (95.3 kg)  12/07/22 208 lb (94.3 kg)  10/28/22 205 lb (93 kg)      Other studies Reviewed: Additional studies/ records that were reviewed today include: TTE 01/14/22  Review of the above records today demonstrates:   1. Left ventricular ejection fraction, by estimation, is 50 to 55%. The  left ventricle has low normal function. The left ventricle has no regional  wall motion abnormalities. There is moderate concentric left ventricular  hypertrophy. Left ventricular  diastolic parameters are indeterminate.   2. Right ventricular systolic function  is mildly reduced. The right  ventricular size is normal. There is normal pulmonary artery systolic  pressure.   3. The mitral valve is normal in structure. No evidence of mitral valve  regurgitation. No evidence of mitral stenosis.   4. Aortic valve regurgitation is not visualized. There is a AORTIC VALVE  REPLACEMENT (AVR) WITH INSPIRIS RESILIA AORTIC VALVE SIZE 25MM (N/A) valve  present in the aortic position. Procedure Date: 10/28/2021. Echo findings  are consistent with normal  structure and function of the aortic valve prosthesis.   5. Aortic Normal DTA.   6. The inferior vena cava is dilated in  size with >50% respiratory  variability, suggesting right atrial pressure of 8 mmHg.    ASSESSMENT AND PLAN:  1.  Atrial fibrillation/atypical atrial flutter: Currently on amiodarone.  Not anticoagulated due to left atrial appendage clip.  CHA2DS2-VASc of at least 2.  Has plans for ablation 02/18/2023.  Masie Bermingham start Eliquis today and prep for ablation.  Risk, benefits, and alternatives to EP study and radiofrequency ablation for afib were also discussed in detail today. These risks include but are not limited to stroke, bleeding, vascular damage, tamponade, perforation, damage to the esophagus, lungs, and other structures, pulmonary vein stenosis, worsening renal function, and death. The patient understands these risk and wishes to proceed.  We Terisha Losasso therefore proceed with catheter ablation at the next available time.  Carto, ICE, anesthesia are requested for the procedure.  Slate Debroux also obtain CT PV protocol prior to the procedure to exclude LAA thrombus and further evaluate atrial anatomy.   2.  Bicuspid aortic valve: Post AVR.  Stable on most recent echo.  3.  Chronic systolic and diastolic heart failure: Ejection fraction is normalized.  Plan per primary cardiology  4.  Second hypercoagulable state: Post left atrial appendage clipping at the time of surgery   Current medicines are reviewed at  length with the patient today.   The patient does not have concerns regarding his medicines.  The following changes were made today: Start Eliquis  Labs/ tests ordered today include:  Orders Placed This Encounter  Procedures   Basic metabolic panel   CBC   EKG 12-Lead     Disposition:   FU 3 months  Signed, Maeven Mcdougall Meredith Leeds, MD  02/01/2023 2:34 PM     [facility] [address] [place] [address] [phone] (office) [phone] (fax)

## 2023-02-02 ENCOUNTER — Telehealth: Payer: Self-pay | Admitting: Cardiology

## 2023-02-02 LAB — CBC
Hematocrit: 50.2 % (ref 37.5–51.0)
Hemoglobin: 16.7 g/dL (ref 13.0–17.7)
MCH: 30.2 pg (ref 26.6–33.0)
MCHC: 33.3 g/dL (ref 31.5–35.7)
MCV: 91 fL (ref 79–97)
Platelets: 199 10*3/uL (ref 150–450)
RBC: 5.53 x10E6/uL (ref 4.14–5.80)
RDW: 12.4 % (ref 11.6–15.4)
WBC: 6.7 10*3/uL (ref 3.4–10.8)

## 2023-02-02 LAB — BASIC METABOLIC PANEL
BUN/Creatinine Ratio: 15 (ref 9–20)
BUN: 18 mg/dL (ref 6–24)
CO2: 25 mmol/L (ref 20–29)
Calcium: 10.2 mg/dL (ref 8.7–10.2)
Chloride: 94 mmol/L — ABNORMAL LOW (ref 96–106)
Creatinine, Ser: 1.17 mg/dL (ref 0.76–1.27)
Glucose: 174 mg/dL — ABNORMAL HIGH (ref 70–99)
Potassium: 4.6 mmol/L (ref 3.5–5.2)
Sodium: 137 mmol/L (ref 134–144)
eGFR: 72 mL/min/{1.73_m2} (ref >59)

## 2023-02-02 NOTE — Telephone Encounter (Signed)
Patient called to let Barbera Setters know he has canceled his back injections. He will be there on 02/18/23 for his procedure as planned.

## 2023-02-09 ENCOUNTER — Ambulatory Visit: Payer: 59 | Admitting: Physician Assistant

## 2023-02-10 ENCOUNTER — Telehealth (HOSPITAL_COMMUNITY): Payer: Self-pay | Admitting: *Deleted

## 2023-02-10 NOTE — Telephone Encounter (Signed)
Reaching out to patient to offer assistance regarding upcoming cardiac imaging study; pt verbalizes understanding of appt date/time, parking situation and where to check in, pre-test NPO status and medications ordered, and verified current allergies; name and call back number provided for further questions should they arise ? ?Shaun Zuccaro RN Navigator Cardiac Imaging ?[facility] ?[phone] office ?[phone] cell ? ?Patient to take 100mg metoprolol tartrate two hours prior to his cardiac CT scan.  He is aware to arrive at 1pm. ?

## 2023-02-11 ENCOUNTER — Ambulatory Visit (HOSPITAL_COMMUNITY)
Admission: RE | Admit: 2023-02-11 | Discharge: 2023-02-11 | Disposition: A | Discharge status: Home / Self Care | Payer: 59 | Source: Ambulatory Visit | Attending: Cardiology | Admitting: Cardiology

## 2023-02-11 DIAGNOSIS — I4819 Other persistent atrial fibrillation: Secondary | ICD-10-CM | POA: Insufficient documentation

## 2023-02-11 MED ORDER — IOHEXOL 350 MG/ML SOLN
100.0000 mL | Freq: Once | INTRAVENOUS | Status: AC | PRN
  Administered 2023-02-11: 100 mL via INTRAVENOUS

## 2023-02-17 ENCOUNTER — Telehealth: Payer: Self-pay | Admitting: Cardiology

## 2023-02-17 NOTE — Pre-Procedure Instructions (Signed)
Instructed patient on the following items: Arrival time 1100 Nothing to eat or drink after midnight No meds AM of procedure Responsible person to drive you home and stay with you for 24 hrs  Have you missed any doses of anti-coagulant Eliquis- hasn't missed any doses,  will take both doses today, none in morning.

## 2023-02-17 NOTE — Telephone Encounter (Signed)
Pt advised to ONLY hold his Eliquis tomorrow morning when he should hold ALL medications for ablation procedure. Patient verbalized understanding and agreeable to plan.

## 2023-02-17 NOTE — Telephone Encounter (Signed)
Pt would like a callback regarding his Cath procedure tomorrow on 3/21 on whether or not he should take his apixaban (ELIQUIS) 5 MG TABS tablet in the morning or not. Please advise.

## 2023-02-18 ENCOUNTER — Other Ambulatory Visit: Payer: Self-pay

## 2023-02-18 ENCOUNTER — Ambulatory Visit (HOSPITAL_BASED_OUTPATIENT_CLINIC_OR_DEPARTMENT_OTHER): Payer: 59 | Admitting: Certified Registered Nurse Anesthetist

## 2023-02-18 ENCOUNTER — Ambulatory Visit (HOSPITAL_COMMUNITY)
Admission: RE | Admit: 2023-02-18 | Discharge: 2023-02-18 | Disposition: A | Discharge status: Home / Self Care | Payer: 59 | Attending: Cardiology | Admitting: Cardiology

## 2023-02-18 ENCOUNTER — Encounter (HOSPITAL_COMMUNITY)
Admission: RE | Disposition: A | Discharge status: Home / Self Care | Payer: Self-pay | Source: Home / Self Care | Attending: Cardiology

## 2023-02-18 ENCOUNTER — Encounter (HOSPITAL_COMMUNITY): Payer: Self-pay | Admitting: Cardiology

## 2023-02-18 ENCOUNTER — Ambulatory Visit (HOSPITAL_COMMUNITY): Payer: 59 | Admitting: Certified Registered Nurse Anesthetist

## 2023-02-18 DIAGNOSIS — I5042 Chronic combined systolic (congestive) and diastolic (congestive) heart failure: Secondary | ICD-10-CM | POA: Diagnosis not present

## 2023-02-18 DIAGNOSIS — Z79899 Other long term (current) drug therapy: Secondary | ICD-10-CM | POA: Diagnosis not present

## 2023-02-18 DIAGNOSIS — Z952 Presence of prosthetic heart valve: Secondary | ICD-10-CM | POA: Insufficient documentation

## 2023-02-18 DIAGNOSIS — I483 Typical atrial flutter: Secondary | ICD-10-CM | POA: Insufficient documentation

## 2023-02-18 DIAGNOSIS — I110 Hypertensive heart disease with heart failure: Secondary | ICD-10-CM | POA: Diagnosis not present

## 2023-02-18 DIAGNOSIS — J449 Chronic obstructive pulmonary disease, unspecified: Secondary | ICD-10-CM | POA: Diagnosis not present

## 2023-02-18 DIAGNOSIS — K7460 Unspecified cirrhosis of liver: Secondary | ICD-10-CM | POA: Insufficient documentation

## 2023-02-18 DIAGNOSIS — I4891 Unspecified atrial fibrillation: Secondary | ICD-10-CM

## 2023-02-18 DIAGNOSIS — D6859 Other primary thrombophilia: Secondary | ICD-10-CM | POA: Insufficient documentation

## 2023-02-18 DIAGNOSIS — I4819 Other persistent atrial fibrillation: Secondary | ICD-10-CM | POA: Diagnosis not present

## 2023-02-18 DIAGNOSIS — I2510 Atherosclerotic heart disease of native coronary artery without angina pectoris: Secondary | ICD-10-CM | POA: Diagnosis not present

## 2023-02-18 DIAGNOSIS — E1149 Type 2 diabetes mellitus with other diabetic neurological complication: Secondary | ICD-10-CM

## 2023-02-18 DIAGNOSIS — Z87891 Personal history of nicotine dependence: Secondary | ICD-10-CM

## 2023-02-18 DIAGNOSIS — Q231 Congenital insufficiency of aortic valve: Secondary | ICD-10-CM | POA: Diagnosis not present

## 2023-02-18 DIAGNOSIS — I10 Essential (primary) hypertension: Secondary | ICD-10-CM | POA: Diagnosis not present

## 2023-02-18 HISTORY — PX: ATRIAL FIBRILLATION ABLATION: EP1191

## 2023-02-18 LAB — GLUCOSE, CAPILLARY
Glucose-Capillary: 250 mg/dL — ABNORMAL HIGH (ref 70–99)
Glucose-Capillary: 245 mg/dL — ABNORMAL HIGH (ref 70–99)

## 2023-02-18 LAB — POCT ACTIVATED CLOTTING TIME: Activated Clotting Time: 358 seconds

## 2023-02-18 SURGERY — ATRIAL FIBRILLATION ABLATION
Anesthesia: General

## 2023-02-18 MED ORDER — PHENYLEPHRINE 80 MCG/ML (10ML) SYRINGE FOR IV PUSH (FOR BLOOD PRESSURE SUPPORT)
PREFILLED_SYRINGE | INTRAVENOUS | Status: DC | PRN
  Administered 2023-02-18: 240 ug via INTRAVENOUS
  Administered 2023-02-18: 160 ug via INTRAVENOUS

## 2023-02-18 MED ORDER — ACETAMINOPHEN 500 MG PO TABS: 1000.0000 mg | ORAL_TABLET | Freq: Once | ORAL | Status: AC

## 2023-02-18 MED ORDER — PROPOFOL 10 MG/ML IV BOLUS
INTRAVENOUS | Status: DC | PRN
  Administered 2023-02-18: 200 mg via INTRAVENOUS

## 2023-02-18 MED ORDER — MIDAZOLAM HCL 2 MG/2ML IJ SOLN
INTRAMUSCULAR | Status: DC | PRN
  Administered 2023-02-18: 2 mg via INTRAVENOUS

## 2023-02-18 MED ORDER — LIDOCAINE 2% (20 MG/ML) 5 ML SYRINGE
INTRAMUSCULAR | Status: DC | PRN
  Administered 2023-02-18: 40 mg via INTRAVENOUS

## 2023-02-18 MED ORDER — SUGAMMADEX SODIUM 200 MG/2ML IV SOLN
INTRAVENOUS | Status: DC | PRN
  Administered 2023-02-18: 200 mg via INTRAVENOUS

## 2023-02-18 MED ORDER — SODIUM CHLORIDE 0.9% FLUSH: 3.0000 mL | Freq: Two times a day (BID) | INTRAVENOUS | Status: DC

## 2023-02-18 MED ORDER — ACETAMINOPHEN 500 MG PO TABS
ORAL_TABLET | ORAL | Status: AC
  Administered 2023-02-18: 1000 mg via ORAL
  Filled 2023-02-18: qty 2

## 2023-02-18 MED ORDER — FENTANYL CITRATE (PF) 250 MCG/5ML IJ SOLN
INTRAMUSCULAR | Status: DC | PRN
  Administered 2023-02-18: 50 ug via INTRAVENOUS

## 2023-02-18 MED ORDER — DOBUTAMINE INFUSION FOR EP/ECHO/NUC (1000 MCG/ML)
INTRAVENOUS | Status: AC
  Filled 2023-02-18: qty 250

## 2023-02-18 MED ORDER — HEPARIN SODIUM (PORCINE) 1000 UNIT/ML IJ SOLN
INTRAMUSCULAR | Status: DC | PRN
  Administered 2023-02-18: 15000 [IU] via INTRAVENOUS
  Administered 2023-02-18: 1000 [IU] via INTRAVENOUS

## 2023-02-18 MED ORDER — ROCURONIUM BROMIDE 10 MG/ML (PF) SYRINGE
PREFILLED_SYRINGE | INTRAVENOUS | Status: DC | PRN
  Administered 2023-02-18: 60 mg via INTRAVENOUS

## 2023-02-18 MED ORDER — HEPARIN SODIUM (PORCINE) 1000 UNIT/ML IJ SOLN
INTRAMUSCULAR | Status: DC | PRN
  Administered 2023-02-18: 1000 [IU] via INTRAVENOUS

## 2023-02-18 MED ORDER — HEPARIN SODIUM (PORCINE) 1000 UNIT/ML IJ SOLN
INTRAMUSCULAR | Status: AC
  Filled 2023-02-18: qty 10

## 2023-02-18 MED ORDER — PROTAMINE SULFATE 10 MG/ML IV SOLN
INTRAVENOUS | Status: DC | PRN
  Administered 2023-02-18: 20 mg via INTRAVENOUS
  Administered 2023-02-18 (×2): 10 mg via INTRAVENOUS

## 2023-02-18 MED ORDER — DEXAMETHASONE SODIUM PHOSPHATE 10 MG/ML IJ SOLN
INTRAMUSCULAR | Status: DC | PRN
  Administered 2023-02-18: 4 mg via INTRAVENOUS

## 2023-02-18 MED ORDER — PHENYLEPHRINE HCL-NACL 20-0.9 MG/250ML-% IV SOLN
INTRAVENOUS | Status: DC | PRN
  Administered 2023-02-18: 25 ug/min via INTRAVENOUS

## 2023-02-18 MED ORDER — ONDANSETRON HCL 4 MG/2ML IJ SOLN
INTRAMUSCULAR | Status: DC | PRN
  Administered 2023-02-18: 4 mg via INTRAVENOUS

## 2023-02-18 MED ORDER — SODIUM CHLORIDE 0.9 % IV SOLN: 250.0000 mL | INTRAVENOUS | Status: DC | PRN

## 2023-02-18 MED ORDER — SODIUM CHLORIDE 0.9 % IV SOLN: INTRAVENOUS | Status: DC

## 2023-02-18 MED ORDER — DOBUTAMINE INFUSION FOR EP/ECHO/NUC (1000 MCG/ML)
INTRAVENOUS | Status: DC | PRN
  Administered 2023-02-18: 20 ug/kg/min via INTRAVENOUS

## 2023-02-18 MED ORDER — SODIUM CHLORIDE 0.9% FLUSH: 3.0000 mL | INTRAVENOUS | Status: DC | PRN

## 2023-02-18 MED ORDER — ACETAMINOPHEN 325 MG PO TABS: 650.0000 mg | ORAL_TABLET | ORAL | Status: DC | PRN

## 2023-02-18 MED ORDER — HEPARIN (PORCINE) IN NACL 1000-0.9 UT/500ML-% IV SOLN
INTRAVENOUS | Status: DC | PRN
  Administered 2023-02-18 (×4): 500 mL

## 2023-02-18 MED ORDER — ONDANSETRON HCL 4 MG/2ML IJ SOLN: 4.0000 mg | Freq: Four times a day (QID) | INTRAMUSCULAR | Status: DC | PRN

## 2023-02-18 SURGICAL SUPPLY — 19 items
CATH ABLAT QDOT MICRO BI TC DF (CATHETERS) IMPLANT
CATH OCTARAY 2.0 F 3-3-3-3-3 (CATHETERS) IMPLANT
CATH PIGTAIL STEERABLE D1 8.7 (WIRE) IMPLANT
CATH S CIRCA THERM PROBE 10F (CATHETERS) IMPLANT
CATH S-M CIRCA TEMP PROBE (CATHETERS) IMPLANT
CATH SOUNDSTAR ECO 8FR (CATHETERS) IMPLANT
CATH WEBSTER BI DIR CS D-F CRV (CATHETERS) IMPLANT
CLOSURE PERCLOSE PROSTYLE (VASCULAR PRODUCTS) IMPLANT
COVER SWIFTLINK CONNECTOR (BAG) ×1 IMPLANT
PACK EP LATEX FREE (CUSTOM PROCEDURE TRAY) ×1
PACK EP LF (CUSTOM PROCEDURE TRAY) ×1 IMPLANT
PAD DEFIB RADIO PHYSIO CONN (PAD) ×1 IMPLANT
PATCH CARTO3 (PAD) IMPLANT
SHEATH CARTO VIZIGO SM CVD (SHEATH) IMPLANT
SHEATH PINNACLE 7F 10CM (SHEATH) IMPLANT
SHEATH PINNACLE 8F 10CM (SHEATH) IMPLANT
SHEATH PINNACLE 9F 10CM (SHEATH) IMPLANT
SHEATH PROBE COVER 6X72 (BAG) IMPLANT
TUBING SMART ABLATE COOLFLOW (TUBING) IMPLANT

## 2023-02-18 NOTE — Anesthesia Postprocedure Evaluation (Signed)
Anesthesia Post Note  Patient: Marcus Hicks  Procedure(s) Performed: ATRIAL FIBRILLATION ABLATION     Patient location during evaluation: PACU Anesthesia Type: General Level of consciousness: awake and alert Pain management: pain level controlled Vital Signs Assessment: post-procedure vital signs reviewed and stable Respiratory status: spontaneous breathing, nonlabored ventilation, respiratory function stable and patient connected to nasal cannula oxygen Cardiovascular status: blood pressure returned to baseline and stable Postop Assessment: no apparent nausea or vomiting Anesthetic complications: no  There were no known notable events for this encounter.  Last Vitals:  Vitals:   02/18/23 1455 02/18/23 1510  BP:  115/76  Pulse:    Resp:  15  Temp: 36.8 C   SpO2:      Last Pain:  Vitals:   02/18/23 1510  TempSrc:   PainSc: 0-No pain                 Tiajuana Amass

## 2023-02-18 NOTE — Interval H&P Note (Signed)
History and Physical Interval Note:  02/18/2023 11:34 AM  Marcus Hicks  has presented today for surgery, with the diagnosis of afib.  The various methods of treatment have been discussed with the patient and family. After consideration of risks, benefits and other options for treatment, the patient has consented to  Procedure(s): ATRIAL FIBRILLATION ABLATION (N/A) as a surgical intervention.  The patient's history has been reviewed, patient examined, no change in status, stable for surgery.  I have reviewed the patient's chart and labs.  Questions were answered to the patient's satisfaction.     Brigitte Soderberg [employer]

## 2023-02-18 NOTE — Discharge Instructions (Signed)
Post procedure care instructions No driving for 4 days. No lifting over 5 lbs for 1 week. No vigorous or sexual activity for 1 week. You may return to work/your usual activities on 02/26/23. Keep procedure site clean & dry. If you notice increased pain, swelling, bleeding or pus, call/return!  You may shower after 24 hours, but no soaking in baths/hot tubs/pools for 1 week.   You have an appointment set up with the [facility].  Multiple studies have shown that being followed by a dedicated atrial fibrillation clinic in addition to the standard care you receive from your other physicians improves health. We believe that enrollment in the atrial fibrillation clinic will allow [place] to better care for you.   The phone number to the [facility] is [phone]. The clinic is staffed Monday through Friday from 8:30am to 5pm.  Directions: The clinic is located in the [facility] [place] the hospital at the MAIN ENTRANCE "A", use [address] to the 6th floor.  Registration desk to the right of elevators on 6th floor  If you have any trouble locating the clinic, please don't hesitate to call [phone].

## 2023-02-18 NOTE — Transfer of Care (Signed)
Immediate Anesthesia Transfer of Care Note  Patient: Marcus Hicks  Procedure(s) Performed: ATRIAL FIBRILLATION ABLATION  Patient Location: Cath Lab  Anesthesia Type:General  Level of Consciousness: awake, alert , and oriented  Airway & Oxygen Therapy: Patient Spontanous Breathing  Post-op Assessment: Report given to RN and Post -op Vital signs reviewed and stable  Post vital signs: Reviewed and stable  Last Vitals:  Vitals Value Taken Time  BP 119/81 02/18/23 1421  Temp    Pulse 92 02/18/23 1425  Resp 17 02/18/23 1425  SpO2 93 % 02/18/23 1425  Vitals shown include unvalidated device data.  Last Pain:  Vitals:   02/18/23 1141  TempSrc: Temporal  PainSc:          Complications: There were no known notable events for this encounter.

## 2023-02-18 NOTE — Anesthesia Preprocedure Evaluation (Signed)
Anesthesia Evaluation  Patient identified by MRN, date of birth, ID band Patient awake    Reviewed: Allergy & Precautions, NPO status , Patient's Chart, lab work & pertinent test results  Airway Mallampati: III  TM Distance: >3 FB Neck ROM: Full    Dental  (+) Dental Advisory Given   Pulmonary sleep apnea , COPD, former smoker   breath sounds clear to auscultation       Cardiovascular hypertension, Pt. on medications and Pt. on home beta blockers + CAD  + dysrhythmias Atrial Fibrillation + Valvular Problems/Murmurs  Rhythm:Regular Rate:Normal  2/23 1. Left ventricular ejection fraction, by estimation, is 50 to 55%. The  left ventricle has low normal function. The left ventricle has no regional  wall motion abnormalities. There is moderate concentric left ventricular  hypertrophy. Left ventricular  diastolic parameters are indeterminate.   2. Right ventricular systolic function is mildly reduced. The right  ventricular size is normal. There is normal pulmonary artery systolic  pressure.   3. The mitral valve is normal in structure. No evidence of mitral valve  regurgitation. No evidence of mitral stenosis.   4. Aortic valve regurgitation is not visualized. There is a AORTIC VALVE  REPLACEMENT (AVR) WITH INSPIRIS RESILIA AORTIC VALVE SIZE 25MM (N/A) valve  present in the aortic position. Procedure Date: 10/28/2021. Echo findings  are consistent with normal  structure and function of the aortic valve prosthesis.   5. Aortic Normal DTA.   6. The inferior vena cava is dilated in size with >50% respiratory  variability, suggesting right atrial pressure of 8 mmHg.      Neuro/Psych  Neuromuscular disease    GI/Hepatic negative GI ROS,,,(+) Cirrhosis       , Hepatitis -, C  Endo/Other  diabetes, Type 2    Renal/GU Renal disease     Musculoskeletal   Abdominal   Peds  Hematology negative hematology ROS (+)    Anesthesia Other Findings   Reproductive/Obstetrics                             Anesthesia Physical Anesthesia Plan  ASA: 3  Anesthesia Plan: General   Post-op Pain Management: Tylenol PO (pre-op)*   Induction: Intravenous  PONV Risk Score and Plan: 2 and Dexamethasone, Ondansetron and Treatment may vary due to age or medical condition  Airway Management Planned: Oral ETT  Additional Equipment: ClearSight  Intra-op Plan:   Post-operative Plan: Extubation in OR  Informed Consent: I have reviewed the patients History and Physical, chart, labs and discussed the procedure including the risks, benefits and alternatives for the proposed anesthesia with the patient or authorized representative who has indicated his/her understanding and acceptance.     Dental advisory given  Plan Discussed with: CRNA  Anesthesia Plan Comments:        Anesthesia Quick Evaluation

## 2023-02-18 NOTE — Progress Notes (Signed)
Pt ambulated to and from bathroom to void with no signs of oozing from bilateral groin sites  

## 2023-02-18 NOTE — Anesthesia Procedure Notes (Signed)
Procedure Name: Intubation Date/Time: 02/18/2023 12:30 PM  Performed by: Carolan Clines, CRNAPre-anesthesia Checklist: Patient identified, Emergency Drugs available, Suction available and Patient being monitored Patient Re-evaluated:Patient Re-evaluated prior to induction Oxygen Delivery Method: Circle System Utilized Preoxygenation: Pre-oxygenation with 100% oxygen Induction Type: IV induction Ventilation: Mask ventilation without difficulty and Oral airway inserted - appropriate to patient size Laryngoscope Size: Mac and 4 Grade View: Grade I Tube type: Oral Tube size: 7.5 mm Number of attempts: 1 Airway Equipment and Method: Stylet and Oral airway Placement Confirmation: ETT inserted through vocal cords under direct vision, positive ETCO2 and breath sounds checked- equal and bilateral Secured at: 22 cm Tube secured with: Tape Dental Injury: Teeth and Oropharynx as per pre-operative assessment

## 2023-02-19 ENCOUNTER — Telehealth: Payer: Self-pay | Admitting: Physician Assistant

## 2023-02-19 ENCOUNTER — Encounter (HOSPITAL_COMMUNITY): Payer: Self-pay | Admitting: Cardiology

## 2023-02-19 NOTE — Telephone Encounter (Signed)
   The patient called the answering service after-hours today. Had delayed callback to patient due to acute issues in the hospital occurring at the same time as page. He had atrial fib ablation yesterday with Dr. Curt Bears. Felt good this morning but this afternoon he noticed he was having minor left foot swelling, face flushed, upper body felt flushed, and he had a slight headache. He realized in retrospect when he went to take PM meds that he'd forgotten to take his AM meds. At first he said he thinks he was confused this morning at which time I told him we would typically advise he proceed to ER for eval and he declined this option. He attributed this to a lack of sleep last night post procedure and the late PM discharge, denies any focal neuro deficits and otherwise feels fine now. He already got caught up with metoprolol and apixaban so we discussed getting back on track with q12hr dosing. He also took his potassium. He wanted to know if he should take his losartan and furosemide tonight. BP 167/96 so he believes symptoms are related to missing his meds this AM. I told him yes, he should go ahead and take them. He also asked if he has issues with constipation can he try a dulcolax to help avoid straining for BM - told him this should be fine as directed. Denies any issues with groin site reports he's been checking it diligently and no complications noted. No CP, palpitations or other red flag symptoms.  We did discuss that we are unable to diagnose his left foot swelling over the phone and if this continues/worsens, he should return for evaluation. He will seek care if condition worsens, otherwise will call if anything else needed going forward.  Charlie Pitter, PA-C

## 2023-02-23 ENCOUNTER — Telehealth: Payer: Self-pay | Admitting: Cardiology

## 2023-02-23 NOTE — Telephone Encounter (Signed)
Pt c/o swelling: STAT is pt has developed SOB within 24 hours  If swelling, where is the swelling located?   Both feet, but more so in left, a little bit in his shin  How much weight have you gained and in what time span?   No  Have you gained 3 pounds in a day or 5 pounds in a week?   No  Do you have a log of your daily weights (if so, list)?   Yes.  Patient states he has been staying steady at 211-212  Are you currently taking a fluid pill?  Yes  Are you currently SOB?  No  Have you traveled recently?  No   Patient stated his BP has been normal, his HR has been ranging in the 70's and 80's.  Patient is concerned about the fluid build up in his legs and wants to know if he should increase his fluid medication.

## 2023-02-23 NOTE — Telephone Encounter (Signed)
Pt reports edema post proceudre. States he usually experiences edema post procedures. States he can barely see his ankle d/t edema. Not sure that he is in afib, HRs are controlled. Aware will send to [facility] for advisement as he reports edema is common occurrence post procedure. Informed that I will forward  to Dr. Curt Bears to see if he is ok w/ couple days of extra Lasix post procedure. Informed that if MD does approve but swelling remains after extra lasix then he will need to f/u with general cardiology/PCP. Patient verbalized understanding and agreeable to plan.

## 2023-02-23 NOTE — Telephone Encounter (Signed)
Spoke with Marcus Hicks who states that since his ablation he has noticed more swelling in his left foot and anterior part of his lower leg. Denis swelling or pain in his calf. Marcus Hicks states that his foot is pinker than before the ablation, states it had been a blu color. Please advise

## 2023-02-24 ENCOUNTER — Ambulatory Visit (HOSPITAL_COMMUNITY)
Admission: RE | Admit: 2023-02-24 | Discharge: 2023-02-24 | Disposition: A | Discharge status: Home / Self Care | Payer: 59 | Source: Ambulatory Visit | Attending: Physician Assistant | Admitting: Physician Assistant

## 2023-02-24 VITALS — BP 116/68 | HR 84 | Ht 65.0 in | Wt 211.0 lb

## 2023-02-24 DIAGNOSIS — J449 Chronic obstructive pulmonary disease, unspecified: Secondary | ICD-10-CM | POA: Insufficient documentation

## 2023-02-24 DIAGNOSIS — I5042 Chronic combined systolic (congestive) and diastolic (congestive) heart failure: Secondary | ICD-10-CM | POA: Diagnosis not present

## 2023-02-24 DIAGNOSIS — I4819 Other persistent atrial fibrillation: Secondary | ICD-10-CM | POA: Diagnosis not present

## 2023-02-24 DIAGNOSIS — Z7901 Long term (current) use of anticoagulants: Secondary | ICD-10-CM | POA: Insufficient documentation

## 2023-02-24 DIAGNOSIS — I38 Endocarditis, valve unspecified: Secondary | ICD-10-CM | POA: Insufficient documentation

## 2023-02-24 DIAGNOSIS — Z79899 Other long term (current) drug therapy: Secondary | ICD-10-CM | POA: Diagnosis not present

## 2023-02-24 DIAGNOSIS — D6869 Other thrombophilia: Secondary | ICD-10-CM | POA: Insufficient documentation

## 2023-02-24 DIAGNOSIS — E669 Obesity, unspecified: Secondary | ICD-10-CM | POA: Insufficient documentation

## 2023-02-24 DIAGNOSIS — Z6835 Body mass index (BMI) 35.0-35.9, adult: Secondary | ICD-10-CM | POA: Diagnosis not present

## 2023-02-24 NOTE — Telephone Encounter (Signed)
Pt made aware of MD recommendations. Informed that I am forwarding message to afib clinic and they will reach out to schedule an appt. Patient verbalized understanding and agreeable to plan.

## 2023-02-24 NOTE — Progress Notes (Signed)
Primary Care Physician: Marge Duncans, PA-C Primary Cardiologist: Dr Bettina Gavia Primary Electrophysiologist: Dr Curt Bears  Referring Physician: Dr Trish Fountain Marcus Hicks is a 57 y.o. male with a history of bicuspid AV, chronic systolic CHF, cirrhosis, COPD, HTN, DM, atrial fibrillation who presents for follow up in the [facility]. Patient is on Eliquis for a CHADS2VASC score of 3. Patient underwent DCCV 08/2020 which was not successful at restoring SR. He was seen by Dr Quentin Ore on 07/15/21 for Watchman consideration given his cirrhosis. Dr Quentin Ore also recommended dofetilide loading for rhythm control. He was loaded on dofetilide 07/2021 but failed to maintain SR and this was discontinued.  Patient underwent AVR, MAZE, LAA clip 09/2021 and developed post operative atrial fibrillation. He was started on amiodarone and had DCCV on 02/26/22. He had recurrent atypical atrial flutter and underwent afib and flutter ablation with Dr Curt Bears on 02/18/23. Amiodarone was discontinued due to blue-grey skin discoloration.   On follow up today, patient noted a weight increase of ~9 lbs since ablation. He did have some lower extremity edema also. This is slowly improving. He denies SOB or orthopnea. He also denies chest pain, swallowing pain, or groin issues. He is in SR today.   Today, he denies symptoms of palpitations, chest pain, orthopnea, PND, lower extremity edema, dizziness, presyncope, syncope, snoring, daytime somnolence, bleeding, or neurologic sequela. The patient is tolerating medications without difficulties and is otherwise without complaint today.    Atrial Fibrillation Risk Factors:  he does not have symptoms or diagnosis of sleep apnea. he does not have a history of rheumatic fever.   he has a BMI of Body mass index is 35.11 kg/m.Marland Kitchen Filed Weights   02/24/23 1418  Weight: 95.7 kg    Family History  Problem Relation Age of Onset   Cancer Maternal  Grandfather    Liver disease Sister    Alzheimer's disease Other    Memory loss Other    Hypertension Other      Atrial Fibrillation Management history:  Previous antiarrhythmic drugs: none Previous cardioversions: 09/24/20, 08/13/21, 02/26/22 Previous ablations: MAZE 09/2021, 02/18/23 CHADS2VASC score: 3 Anticoagulation history: Eliquis   Past Medical History:  Diagnosis Date   Accretions on teeth 09/22/2021   Acquired thrombophilia ([place]) 08/11/2021   Acute kidney injury ([place]) 06/01/2021   Atrial fibrillation with rapid ventricular response ([place]) 08/12/2020   Bicuspid aortic valve    BMI 38.0-38.9,adult 11/26/2020   Bruxism (teeth grinding) 09/22/2021   [place] 09/22/2021   CHF (congestive heart failure) (HCC)    Chronic hepatitis C virus infection ([place]) 08/03/2013   Chronic periodontitis 09/22/2021   Cirrhosis of liver without ascites, unspecified hepatic cirrhosis type ([place]) 12/25/2021   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  Advised to get better sleep, walk on a regular basis, decrease weight down to about 210 and maintain, and eliminate tobacco.   Excessive dental attrition 09/22/2021   HFrEF (heart failure with reduced ejection fraction) ([place]) 08/22/2020   History of maze procedure 03/31/2022  Hyperactive gag reflex 09/22/2021   Hyperlipidemia 10/12/2016   Impacted third molar tooth 09/22/2021   Incipient enamel caries 09/22/2021   Long term (current) use of anticoagulants 09/22/2021   Long-term insulin use ([place])  10/12/2016   Morbid obesity ([place]) 10/12/2016   Patient advised to lose down to about 210 pounds and maintained.  Regular walking program recommended.  He knows how to lose weight.   Obesity, unspecified 10/12/2016   Patient advised to lose down to about 210 pounds and maintained.  Regular walking program recommended.  He knows how to lose weight.   Phobia of dental procedure 09/22/2021   Routine general medical examination at a health care facility 01/28/2021   S/P AVR (aortic valve replacement) and aortoplasty 10/28/2021   Sleep apnea    Teeth missing 09/22/2021   Thoracic aortic aneurysm without rupture, unspecified part ([place]) 03/31/2022   Type 2 diabetes mellitus with diabetic polyneuropathy ([place]) 10/12/2016   Past Surgical History:  Procedure Laterality Date   AORTIC ROOT ENLARGEMENT  10/28/2021   Procedure: AORTIC ROOT ENLARGEMENT WITH HEMASHIELD PLATINUM 28MM X 30CM;  Surgeon: Lajuana Matte, MD;  Location: [place];  Service: Open Heart Surgery;;   AORTIC VALVE REPLACEMENT N/A 10/28/2021   Procedure: AORTIC VALVE REPLACEMENT (AVR) WITH INSPIRIS RESILIA AORTIC VALVE SIZE 25MM;  Surgeon: Lajuana Matte, MD;  Location: [place];  Service: Open Heart Surgery;  Laterality: N/A;   ATRIAL FIBRILLATION ABLATION N/A 02/18/2023   Procedure: ATRIAL FIBRILLATION ABLATION;  Surgeon: Constance Haw, MD;  Location: [place] [facility];  Service: Cardiovascular;  Laterality: N/A;   CARDIAC CATHETERIZATION     CARDIOVERSION N/A 09/24/2020   Procedure: CARDIOVERSION;  Surgeon: Donato [name], MD;  Location[facility] ENDOSCOPY;  Service: Cardiovascular;  Laterality: N/A;   CARDIOVERSION N/A 08/13/2021   Procedure: CARDIOVERSION;  Surgeon: Elouise Munroe, MD;  Location: [facility] ENDOSCOPY;  Service: Cardiovascular;  Laterality: N/A;   CARDIOVERSION N/A 02/26/2022   Procedure: CARDIOVERSION;  Surgeon: Berniece Salines, DO;  Location: [place];  Service: Cardiovascular;  Laterality: N/A;   CCTA   08/2019   CLIPPING OF ATRIAL APPENDAGE N/A 10/28/2021   Procedure: CLIPPING OF ATRIAL APPENDAGE WITH ATRICLIP ACHV45;  Surgeon: Lajuana Matte, MD;  Location: [place];  Service: Open Heart Surgery;  Laterality: N/A;   MAZE N/A 10/28/2021   Procedure: MAZE;  Surgeon: Lajuana Matte, MD;  Location: [place];  Service: Open Heart Surgery;  Laterality: N/A;   RIGHT/LEFT HEART CATH AND CORONARY ANGIOGRAPHY N/A 09/26/2021   Procedure: RIGHT/LEFT HEART CATH AND CORONARY ANGIOGRAPHY;  Surgeon: Martinique, Peter M, MD;  Location: [place] [facility];  Service: Cardiovascular;  Laterality: N/A;   TEE WITHOUT CARDIOVERSION  09/24/2020   Procedure: TRANSESOPHAGEAL ECHOCARDIOGRAM (TEE);  Surgeon: Donato [name], MD;  Location: [place];  Service: Cardiovascular;;   TEE WITHOUT CARDIOVERSION N/A 10/28/2021   Procedure: TRANSESOPHAGEAL ECHOCARDIOGRAM (TEE);  Surgeon: Lajuana Matte, MD;  Location: [place];  Service: Open Heart Surgery;  Laterality: N/A;    Current Outpatient Medications  Medication Sig Dispense Refill   acetaminophen (TYLENOL) 500 MG tablet Take 500 mg by mouth every 6 (six) hours as needed for mild pain or fever.     albuterol (PROVENTIL) (2.5 MG/3ML) 0.083% nebulizer solution Take 3 mLs (2.5 mg total) by nebulization every 6 (six) hours as needed for wheezing.     ALPRAZolam (XANAX) 0.5 MG tablet TAKE 1 TABLET(0.5 MG) BY MOUTH DAILY AS NEEDED FOR ANXIETY 30 tablet 0  apixaban (ELIQUIS) 5 MG TABS tablet Take 1 tablet (5 mg total) by mouth 2 (two) times daily. 60 tablet 6   Dulaglutide (TRULICITY) 3 0000000 SOPN Inject 3 mg as directed once a week. 3 mL 2   furosemide (LASIX) 20 MG tablet Take 1 tablet (20 mg total) by mouth daily. 90 tablet 1   Insulin Pen Needle (BD PEN NEEDLE NANO 2ND GEN) 32G X 4 MM MISC USE ONCE DAILY 100 each 3   losartan (COZAAR) 50 MG tablet TAKE 1 TABLET(50 MG) BY MOUTH DAILY 90 tablet 2   metoprolol tartrate (LOPRESSOR) 25 MG tablet TAKE 1  TABLET(25 MG) BY MOUTH TWICE DAILY 180 tablet 2   Microlet Lancets MISC 1 each by Does not apply route daily in the afternoon. 100 each 2   potassium chloride SA (KLOR-CON M) 20 MEQ tablet Take 1 tablet (20 mEq total) by mouth 2 (two) times daily. 180 tablet 3   rosuvastatin (CRESTOR) 40 MG tablet TAKE 1 TABLET(40 MG) BY MOUTH DAILY 90 tablet 2   TRULICITY 1.5 0000000 SOPN Inject 3 mg into the skin once a week.     umeclidinium-vilanterol (ANORO ELLIPTA) 62.5-25 MCG/ACT AEPB Inhale 1 puff into the lungs daily. (Patient taking differently: Inhale 1 puff into the lungs daily as needed (Breathing).) 60 each 0   zaleplon (SONATA) 5 MG capsule Take 1 capsule (5 mg total) by mouth at bedtime as needed for sleep. (Patient not taking: Reported on 02/24/2023) 30 capsule 0   No current facility-administered medications for this encounter.    No Known Allergies  Social History   Socioeconomic History   Marital status: Widowed    Spouse name: Not on file   Number of children: Not on file   Years of education: Not on file   Highest education level: Not on file  Occupational History   Not on file  Tobacco Use   Smoking status: Former    Passive exposure: Past   Smokeless tobacco: Former    Types: Snuff   Tobacco comments:    Former snuff 08/11/2021  Vaping Use   Vaping Use: Never used  Substance and Sexual Activity   Alcohol use: Not Currently   Drug use: Never   Sexual activity: Never  Other Topics Concern   Not on file  Social History Narrative   Not on file   Social Determinants of Health   Financial Resource Strain: Not on file  Food Insecurity: Not on file  Transportation Needs: Not on file  Physical Activity: Not on file  Stress: Not on file  Social Connections: Not on file  Intimate Partner Violence: Not on file    ROS- All systems are reviewed and negative except as per the HPI above.  Physical Exam: Vitals:   02/24/23 1418  BP: 116/68  Pulse: 84  Weight: 95.7 kg   Height: 5\' 5"  (1.651 m)    GEN- The patient is a well appearing male, alert and oriented x 3 today.   HEENT-head normocephalic, atraumatic, sclera clear, conjunctiva pink, hearing intact, trachea midline. Lungs- Clear to ausculation bilaterally, normal work of breathing Heart- Regular rate and rhythm, no murmurs, rubs or gallops  GI- soft, NT, ND, + BS Extremities- no clubbing, cyanosis, trace bilateral edema MS- no significant deformity or atrophy Skin- no rash or lesion Psych- euthymic mood, full affect Neuro- strength and sensation are intact   Wt Readings from Last 3 Encounters:  02/24/23 95.7 kg  02/18/23 93 kg  02/01/23 95.3  kg    EKG today demonstrates  SR Vent. rate 84 BPM PR interval 168 ms QRS duration 106 ms QT/QTcB 392/463 ms  Echo 01/14/22 demonstrated   1. Left ventricular ejection fraction, by estimation, is 50 to 55%. The  left ventricle has low normal function. The left ventricle has no regional  wall motion abnormalities. There is moderate concentric left ventricular  hypertrophy. Left ventricular diastolic parameters are indeterminate.   2. Right ventricular systolic function is mildly reduced. The right  ventricular size is normal. There is normal pulmonary artery systolic  pressure.   3. The mitral valve is normal in structure. No evidence of mitral valve  regurgitation. No evidence of mitral stenosis.   4. Aortic valve regurgitation is not visualized. There is a AORTIC VALVE  REPLACEMENT (AVR) WITH INSPIRIS RESILIA AORTIC VALVE SIZE 25MM (N/A) valve  present in the aortic position. Procedure Date: 10/28/2021. Echo findings  are consistent with normal  structure and function of the aortic valve prosthesis.   5. Aortic Normal DTA.   6. The inferior vena cava is dilated in size with >50% respiratory  variability, suggesting right atrial pressure of 8 mmHg.    Epic records are reviewed at length today  CHA2DS2-VASc Score = 2  The patient's score  is based upon: CHF History: 0 (EF normalized) HTN History: 1 Diabetes History: 1 Stroke History: 0 Vascular Disease History: 0 Age Score: 0 Gender Score: 0       ASSESSMENT AND PLAN: 1. Persistent Atrial Fibrillation (ICD10:  I48.19) The patient's CHA2DS2-VASc score is 2, indicating a 2.2% annual risk of stroke. S/p MAZE 10/28/21 Previously failed dofetilide  S/p afib and flutter ablation 02/18/23 Patient in [place].  Continue Eliquis 5 mg BID with no missed doses for 3 months post ablation.  Continue Lopressor 25 mg BID  2. Secondary Hypercoagulable State (ICD10:  D68.69) The patient is at significant risk for stroke/thromboembolism based upon his CHA2DS2-VASc Score of 2.  Continue Apixaban (Eliquis).   3. Obesity Body mass index is 35.11 kg/m. Lifestyle modification was discussed and encouraged including regular physical activity and weight reduction.  4. Valvular heart disease Bicuspid AV S/p AVR 09/2021 with MAZE and LAA clip.  5. Chronic systolic and diastolic CHF EF normalized His weight was up from 205 to 214 post ablation.  Will have him increase Lasix to 40 mg daily x 3 days then decrease back to his regular dose.    Follow up with Dr Bettina Gavia and Dr Curt Bears as scheduled.    [facility] [address] [place], [place] [address] [phone] 02/24/2023 2:58 PM

## 2023-02-24 NOTE — Patient Instructions (Signed)
Increase lasix to 40mg  daily for 3 days then back to normal dosing

## 2023-03-01 DIAGNOSIS — Z419 Encounter for procedure for purposes other than remedying health state, unspecified: Secondary | ICD-10-CM | POA: Diagnosis not present

## 2023-03-03 ENCOUNTER — Other Ambulatory Visit: Payer: Self-pay | Admitting: Physician Assistant

## 2023-03-03 DIAGNOSIS — E1142 Type 2 diabetes mellitus with diabetic polyneuropathy: Secondary | ICD-10-CM

## 2023-03-18 ENCOUNTER — Ambulatory Visit (HOSPITAL_COMMUNITY): Payer: 59 | Admitting: Physician Assistant

## 2023-03-28 ENCOUNTER — Other Ambulatory Visit: Payer: Self-pay | Admitting: Physician Assistant

## 2023-03-28 DIAGNOSIS — E1142 Type 2 diabetes mellitus with diabetic polyneuropathy: Secondary | ICD-10-CM

## 2023-03-28 NOTE — Progress Notes (Unsigned)
Cardiology Office Note:    Date:  03/30/2023   ID:  [address] Wille Aubuchon, [place] 06-05-1966, MRN [id number]  PCP:  Marianne Sofia, PA-C  Cardiologist:  Norman Herrlich, MD    Referring MD: Abigail Miyamoto,*    ASSESSMENT:    1. S/P AVR (aortic valve replacement) and aortoplasty   2. Atypical atrial flutter (HCC)   3. Hypertensive heart disease with heart failure (HCC)   4. Mixed hyperlipidemia   5. Cirrhosis of liver without ascites, unspecified hepatic cirrhosis type (HCC)    PLAN:    In order of problems listed above:  Finally is improving maintaining sinus rhythm EF is normalized I encouraged him to increase his activities Follow-up with the EP consider transition anticoagulant antiplatelet agent Heart failure is compensated continue his loop diuretic and beta-blocker Continue statin with CAD   Next appointment: 6 months   Medication Adjustments/Labs and Tests Ordered: Current medicines are reviewed at length with the patient today.  Concerns regarding medicines are outlined above.  No orders of the defined types were placed in this encounter.  No orders of the defined types were placed in this encounter.  Follow-up valvular heart disease heart failure after EP catheter isolation   History of Present Illness:    Marcus Hicks is a 57 y.o. male with a hx of quite complex heart disease including cardiothoracic surgery 10/28/2021 with surgical aortic valve replacement and aortic root surgery surgical Maze procedure and left atrial appendage occlusion atrial fibrillation previously on amiodarone to maintain sinus rhythm heart failure initiated reduced ejection fraction mild CAD hyperlipidemia hepatic cirrhosis and subsequently atrial flutter refractory to dofetilide last seen 09/30/2022.  He underwent EP catheter ablation 02/18/2023 and is maintaining sinus rhythm.  His last echocardiogram February 2023 showed normalization of ejection fraction 50 to 55% Compliance with  diet, lifestyle and medications: Yes  Is slowly and steadily improved he is not having edema shortness of breath chest pain palpitation or syncope Remains anticoagulated we will discuss that with electrophysiology in his left atrial appendage occluder and perhaps he can be transitioned to antiplatelet therapy Past Medical History:  Diagnosis Date   Accretions on teeth 09/22/2021   Acquired thrombophilia (HCC) 08/11/2021   Acute kidney injury (HCC) 06/01/2021   Atrial fibrillation with rapid ventricular response (HCC) 08/12/2020   Bicuspid aortic valve    BMI 38.0-38.9,adult 11/26/2020   Bruxism (teeth grinding) 09/22/2021   Caries 09/22/2021   CHF (congestive heart failure) (HCC)    Chronic hepatitis C virus infection (HCC) 08/03/2013   Chronic periodontitis 09/22/2021   Cirrhosis of liver without ascites, unspecified hepatic cirrhosis type (HCC) 12/25/2021   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   Dyslipidemia 07/03/2015   Dysrhythmia    A Fib   Encounter for follow-up for aortic valve replacement 12/25/2021   Encounter for preoperative dental examination 09/22/2021   Essential hypertension 07/03/2015   Last Assessment & Plan:  Blood pressure is borderline high.  Advised to get better sleep, walk on a regular basis, decrease weight down to about 210 and maintain, and eliminate tobacco.   Excessive dental attrition 09/22/2021   HFrEF (heart failure with reduced ejection fraction) (HCC) 08/22/2020   History of maze procedure 03/31/2022   Hyperactive gag reflex 09/22/2021   Hyperlipidemia 10/12/2016   Impacted third molar tooth  09/22/2021   Incipient enamel caries 09/22/2021   Long term (current) use of anticoagulants 09/22/2021   Long-term insulin use (HCC) 10/12/2016   Morbid obesity (HCC) 10/12/2016    Patient advised to lose down to about 210 pounds and maintained.  Regular walking program recommended.  He knows how to lose weight.   Obesity, unspecified 10/12/2016   Patient advised to lose down to about 210 pounds and maintained.  Regular walking program recommended.  He knows how to lose weight.   Phobia of dental procedure 09/22/2021   Routine general medical examination at a health care facility 01/28/2021   S/P AVR (aortic valve replacement) and aortoplasty 10/28/2021   Sleep apnea    Teeth missing 09/22/2021   Thoracic aortic aneurysm without rupture, unspecified part (HCC) 03/31/2022   Type 2 diabetes mellitus with diabetic polyneuropathy (HCC) 10/12/2016    Past Surgical History:  Procedure Laterality Date   AORTIC ROOT ENLARGEMENT  10/28/2021   Procedure: AORTIC ROOT ENLARGEMENT WITH HEMASHIELD PLATINUM X 30CM;  Surgeon: Corliss Skains, MD;  Location: [facility];  Service: Open Heart Surgery;;   AORTIC VALVE REPLACEMENT N/A 10/28/2021   Procedure: AORTIC VALVE REPLACEMENT (AVR) WITH INSPIRIS RESILIA AORTIC VALVE SIZE ;  Surgeon: Corliss Skains, MD;  Location: [facility] OR;  Service: Open Heart Surgery;  Laterality: N/A;   ATRIAL FIBRILLATION ABLATION N/A 02/18/2023   Procedure: ATRIAL FIBRILLATION ABLATION;  Surgeon: Regan Lemming, MD;  Location: [facility] INVASIVE CV LAB;  Service: Cardiovascular;  Laterality: N/A;   CARDIAC CATHETERIZATION     CARDIOVERSION N/A 09/24/2020   Procedure: CARDIOVERSION;  Surgeon: [name], MD;  Location: [facility] ENDOSCOPY;  Service: Cardiovascular;  Laterality: N/A;   CARDIOVERSION N/A 08/13/2021   Procedure: CARDIOVERSION;  Surgeon: [name], MD;  Location: [facility] ENDOSCOPY;  Service: Cardiovascular;  Laterality: N/A;   CARDIOVERSION N/A 02/26/2022   Procedure: CARDIOVERSION;  Surgeon: Thomasene Ripple, DO;  Location: [facility] ENDOSCOPY;  Service: Cardiovascular;  Laterality: N/A;   CCTA  08/2019   CLIPPING OF ATRIAL APPENDAGE N/A  10/28/2021   Procedure: CLIPPING OF ATRIAL APPENDAGE WITH ATRICLIP ACHV45;  Surgeon: Corliss Skains, MD;  Location: [facility] OR;  Service: Open Heart Surgery;  Laterality: N/A;   MAZE N/A 10/28/2021   Procedure: MAZE;  Surgeon: Corliss Skains, MD;  Location: [facility] OR;  Service: Open Heart Surgery;  Laterality: N/A;   RIGHT/LEFT HEART CATH AND CORONARY ANGIOGRAPHY N/A 09/26/2021   Procedure: RIGHT/LEFT HEART CATH AND CORONARY ANGIOGRAPHY;  Surgeon: Swaziland, Peter M, MD;  Location: [facility] INVASIVE CV LAB;  Service: Cardiovascular;  Laterality: N/A;   TEE WITHOUT CARDIOVERSION  09/24/2020   Procedure: TRANSESOPHAGEAL ECHOCARDIOGRAM (TEE);  Surgeon: [name], MD;  Location: [facility] ENDOSCOPY;  Service: Cardiovascular;;   TEE WITHOUT CARDIOVERSION N/A 10/28/2021   Procedure: TRANSESOPHAGEAL ECHOCARDIOGRAM (TEE);  Surgeon: Corliss Skains, MD;  Location: [facility] OR;  Service: Open Heart Surgery;  Laterality: N/A;    Current Medications: Current Meds  Medication Sig   acetaminophen (TYLENOL) 500 MG tablet Take 500 mg by mouth every 6 (six) hours as needed for mild pain or fever.   albuterol (PROVENTIL) (2.5 MG/3ML) 0.083% nebulizer solution Take 3 mLs (2.5 mg total) by nebulization every 6 (six) hours as needed for wheezing.   ALPRAZolam (XANAX) 0.5 MG tablet TAKE 1 TABLET(0.5 MG) BY MOUTH DAILY AS NEEDED FOR ANXIETY   apixaban (ELIQUIS) 5 MG TABS tablet Take 1 tablet (5 mg total) by mouth 2 (two) times daily.  Dulaglutide (TRULICITY) 3 MG/0.5ML SOPN INJECT 3 MG AS DIRECTED ONCE A WEEK.   furosemide (LASIX) 20 MG tablet Take 1 tablet (20 mg total) by mouth daily.   Insulin Pen Needle (BD PEN NEEDLE NANO 2ND GEN) 32G X 4 MM MISC USE ONCE DAILY   losartan (COZAAR) 50 MG tablet TAKE 1 TABLET(50 MG) BY MOUTH DAILY   metoprolol tartrate (LOPRESSOR) 25 MG tablet TAKE 1 TABLET(25 MG) BY MOUTH TWICE DAILY   Microlet Lancets MISC 1 each by Does not apply route daily in the afternoon.   potassium  chloride SA (KLOR-CON M) 20 MEQ tablet Take 1 tablet (20 mEq total) by mouth 2 (two) times daily.   rosuvastatin (CRESTOR) 40 MG tablet TAKE 1 TABLET(40 MG) BY MOUTH DAILY   umeclidinium-vilanterol (ANORO ELLIPTA) 62.5-25 MCG/ACT AEPB Inhale 1 puff into the lungs daily. (Patient taking differently: Inhale 1 puff into the lungs daily as needed (Breathing).)     Allergies:   Patient has no known allergies.   Social History   Socioeconomic History   Marital status: Widowed    Spouse name: Not on file   Number of children: Not on file   Years of education: Not on file   Highest education level: Not on file  Occupational History   Not on file  Tobacco Use   Smoking status: Former    Passive exposure: Past   Smokeless tobacco: Former    Types: Snuff   Tobacco comments:    Former snuff 08/11/2021  Vaping Use   Vaping Use: Never used  Substance and Sexual Activity   Alcohol use: Not Currently   Drug use: Never   Sexual activity: Never  Other Topics Concern   Not on file  Social History Narrative   Not on file   Social Determinants of Health   Financial Resource Strain: Not on file  Food Insecurity: Not on file  Transportation Needs: Not on file  Physical Activity: Not on file  Stress: Not on file  Social Connections: Not on file     Family History: The patient's family history includes Alzheimer's disease in an other family member; Cancer in his maternal grandfather; Hypertension in an other family member; Liver disease in his sister; Memory loss in an other family member. ROS:   Please see the history of present illness.    All other systems reviewed and are negative.  EKGs/Labs/Other Studies Reviewed:    The following studies were reviewed today:  Cardiac Studies & Procedures   CARDIAC CATHETERIZATION  CARDIAC CATHETERIZATION 09/29/2021  Narrative   Prox LAD to Mid LAD lesion is 35% stenosed.   LV end diastolic pressure is moderately elevated.   Hemodynamic  findings consistent with moderate pulmonary hypertension.  Nonobstructive CAD Aortic stenosis. Gradient cannot be accurately measured by pullback given Afib but there is an AV gradient. Moderately elevated LV filling pressures Moderate pulmonary HTN Cardiac index 1.9  Plan: will initiate lasix 20 mg daily. Continue other therapy. CT surgery assessment in process for AVR/Maze/LA ligation.  Findings Coronary Findings Diagnostic  Dominance: Right  Left Main Vessel was injected. Vessel is normal in caliber. Vessel is angiographically normal.  Left Anterior Descending Prox LAD to Mid LAD lesion is 35% stenosed.  Left Circumflex Vessel was injected. Vessel is large. Vessel is angiographically normal.  Right Coronary Artery Vessel was injected. Vessel is normal in caliber. The vessel exhibits minimal luminal irregularities.  Intervention  No interventions have been documented.     ECHOCARDIOGRAM  ECHOCARDIOGRAM  COMPLETE 01/14/2022  Narrative ECHOCARDIOGRAM REPORT    Patient Name:   Marcus Hicks [facility] Date of Exam: 01/14/2022 Medical Rec #:  [id number]           Height:       65.0 in Accession #:    [id number]          Weight:       219.0 lb Date of Birth:  [birth year]           BSA:          2.056 m Patient Age:    56 years            BP:           102/70 mmHg Patient Gender: M                   HR:           95 bpm. Exam Location:  [place]  Procedure: 2D Echo, Cardiac Doppler, Color Doppler and Strain Analysis  Indications:    Aortic stenosis I35.0  History:        Patient has no prior history of Echocardiogram examinations. CHF, COPD, S/P AVR, Arrythmias:Atrial Fibrillation; Risk Factors:Diabetes, Dyslipidemia and Hypertension. Aortic Valve: AORTIC VALVE REPLACEMENT (AVR) WITH INSPIRIS RESILIA AORTIC VALVE SIZE (N/A) valve is present in the aortic position. Procedure Date: 10/28/2021.  Sonographer:    Louie Boston RDCS Referring Phys: [id number] Cedrica Brune J  Janiel Crisostomo  IMPRESSIONS   1. Left ventricular ejection fraction, by estimation, is 50 to 55%. The left ventricle has low normal function. The left ventricle has no regional wall motion abnormalities. There is moderate concentric left ventricular hypertrophy. Left ventricular diastolic parameters are indeterminate. 2. Right ventricular systolic function is mildly reduced. The right ventricular size is normal. There is normal pulmonary artery systolic pressure. 3. The mitral valve is normal in structure. No evidence of mitral valve regurgitation. No evidence of mitral stenosis. 4. Aortic valve regurgitation is not visualized. There is a AORTIC VALVE REPLACEMENT (AVR) WITH INSPIRIS RESILIA AORTIC VALVE SIZE (N/A) valve present in the aortic position. Procedure Date: 10/28/2021. Echo findings are consistent with normal structure and function of the aortic valve prosthesis. 5. Aortic Normal DTA. 6. The inferior vena cava is dilated in size with >50% respiratory variability, suggesting right atrial pressure of 8 mmHg.  FINDINGS Left Ventricle: Left ventricular ejection fraction, by estimation, is 50 to 55%. The left ventricle has low normal function. The left ventricle has no regional wall motion abnormalities. Global longitudinal strain performed but not reported based on interpreter judgement due to suboptimal tracking. The left ventricular internal cavity size was normal in size. There is moderate concentric left ventricular hypertrophy. Left ventricular diastolic parameters are indeterminate. Indeterminate filling pressures.  Right Ventricle: The right ventricular size is normal. No increase in right ventricular wall thickness. Right ventricular systolic function is mildly reduced. There is normal pulmonary artery systolic pressure. The tricuspid regurgitant velocity is 2.45 m/s, and with an assumed right atrial pressure of 8 mmHg, the estimated right ventricular systolic pressure is 32.0  mmHg.  Left Atrium: Left atrial size was normal in size.  Right Atrium: Right atrial size was normal in size.  Pericardium: There is no evidence of pericardial effusion.  Mitral Valve: The mitral valve is normal in structure. No evidence of mitral valve regurgitation. No evidence of mitral valve stenosis.  Tricuspid Valve: The tricuspid valve is normal in structure. Tricuspid valve regurgitation is mild . No evidence  of tricuspid stenosis.  Aortic Valve: Aortic valve regurgitation is not visualized. Aortic valve mean gradient measures 8.0 mmHg. Aortic valve peak gradient measures 15.2 mmHg. Aortic valve area, by VTI measures 1.08 cm. There is a AORTIC VALVE REPLACEMENT (AVR) WITH INSPIRIS RESILIA AORTIC VALVE SIZE (N/A) valve present in the aortic position. Procedure Date: 10/28/2021. Echo findings are consistent with normal structure and function of the aortic valve prosthesis.  Pulmonic Valve: The pulmonic valve was normal in structure. Pulmonic valve regurgitation is not visualized. No evidence of pulmonic stenosis.  Aorta: The aortic root and ascending aorta are structurally normal, with no evidence of dilitation, the aortic arch was not well visualized and Normal DTA.  Venous: A systolic blunting flow pattern is recorded from the right upper pulmonary vein. The inferior vena cava is dilated in size with greater than 50% respiratory variability, suggesting right atrial pressure of 8 mmHg.  IAS/Shunts: No atrial level shunt detected by color flow Doppler.   LEFT VENTRICLE PLAX 2D LVIDd:         4.90 cm     Diastology LVIDs:         3.60 cm     LV e' medial:    5.33 cm/s LV PW:         1.40 cm     LV E/e' medial:  22.2 LV IVS:        1.50 cm     LV e' lateral:   10.30 cm/s LVOT diam:     2.00 cm     LV E/e' lateral: 11.5 LV SV:         35 LV SV Index:   17 LVOT Area:     3.14 cm  LV Volumes (MOD) LV vol d, MOD A2C: 64.4 ml LV vol d, MOD A4C: 55.0 ml LV vol s, MOD A2C:  28.5 ml LV vol s, MOD A4C: 30.8 ml LV SV MOD A2C:     35.9 ml LV SV MOD A4C:     55.0 ml LV SV MOD BP:      33.0 ml  RIGHT VENTRICLE            IVC RV S prime:     7.62 cm/s  IVC diam: 1.80 cm TAPSE (M-mode): 1.7 cm  LEFT ATRIUM             Index        RIGHT ATRIUM           Index LA diam:        4.50 cm 2.19 cm/m   RA Area:     17.20 cm LA Vol (A2C):   39.5 ml 19.21 ml/m  RA Volume:   51.10 ml  24.86 ml/m LA Vol (A4C):   46.1 ml 22.42 ml/m LA Biplane Vol: 42.9 ml 20.87 ml/m AORTIC VALVE AV Area (Vmax):    1.21 cm AV Area (Vmean):   1.12 cm AV Area (VTI):     1.08 cm AV Vmax:           195.00 cm/s AV Vmean:          134.000 cm/s AV VTI:            0.327 m AV Peak Grad:      15.2 mmHg AV Mean Grad:      8.0 mmHg LVOT Vmax:         75.00 cm/s LVOT Vmean:        47.600 cm/s LVOT VTI:  0.112 m LVOT/AV VTI ratio: 0.34  AORTA Ao Root diam: 2.80 cm Ao Asc diam:  3.40 cm Ao Desc diam: 2.30 cm  MITRAL VALVE                TRICUSPID VALVE MV Area (PHT): 5.20 cm     TR Peak grad:   24.0 mmHg MV Decel Time: 146 msec     TR Vmax:        245.00 cm/s MV E velocity: 118.50 cm/s MV A velocity: 84.20 cm/s   SHUNTS MV E/A ratio:  1.41         Systemic VTI:  0.11 m Systemic Diam: 2.00 cm  Norman Herrlich MD Electronically signed by Norman Herrlich MD Signature Date/Time: 01/14/2022/5:09:27 PM    Final   TEE  ECHO INTRAOPERATIVE TEE 11/14/2021  Interpretation Summary   Left ventricle: Normal cavity size and wall thickness. Wall motion is abnormal.   Septum: Patent Foramen Ovale present with left to right shunt visualized by color Doppler.   Left atrium: Patent foramen ovale present with left to right shunting indicated by color flow Doppler.   Aortic valve: The valve is bicuspid. Severe stenosis. Mild regurgitation.   Mitral valve:  Mild mitral annular calcification. Mild regurgitation.   Right ventricle:  Normal wall thickness. Mildly reduced systolic function.    Tricuspid valve: Moderate regurgitation. The tricuspid valve regurgitation jet is directed toward septum.  Report is limited as several images did not come over for post procedure review.    CT SCANS  CT CARDIAC SCORING (SELF PAY ONLY) 05/16/2021  Addendum 05/16/2021  6:35 PM ADDENDUM REPORT: 05/16/2021 18:33  CLINICAL DATA:  Risk stratification: 57 Year-old White Male  EXAM: Coronary Calcium Score  TECHNIQUE: The patient was scanned on a [employer]. Axial non-contrast 3 mm slices were carried out through the heart. The data set was analyzed on a dedicated work station and scored using the Agatson method.  FINDINGS: Non-cardiac: See separate report from [facility].  Ascending Aorta: Normal caliber.  Aortic atherosclerosis noted.  Aortic Valve: Significant aortic valve calcification. Aortic valve calcium score of 1141.  Pericardium: Normal.  Coronary arteries: Normal origins.  Coronary Calcium Score:  Left main: 0  Left anterior descending artery: 53  Left circumflex artery: 0  Right coronary artery: 0  Total: 53  Percentile: 69th for age, sex, and race matched control.  IMPRESSION: 1. Coronary calcium score of 53. This was 69th percentile for age, gender, and race matched controls.  2. Aortic atherosclerosis noted.  3. Aortic valve calcium score of 1141, which is less suggestive of severe aortic stenosis, but recommend correlation with recent echocardiogram for complete assessment.  RECOMMENDATIONS:  The proposed cut-off value of 1,651 AU yielded a 93 % sensitivity and 75 % specificity in grading AS severity in patients with classical low-flow, low-gradient AS. Proposed different cut-off values to define severe AS for men and women as 2,065 AU and 1,274 AU, respectively. The joint European and American recommendations for the assessment of AS consider the aortic valve calcium score as a continuum - a very high calcium score suggests  severe AS and a low calcium score suggests severe AS is unlikely.  Sunday Shams, et al. 2017 ESC/EACTS Guidelines for the management of valvular heart disease. Eur Heart J 2017;38:2739-91.  Coronary artery calcium (CAC) score is a strong predictor of incident coronary heart disease (CHD) and provides predictive information beyond traditional risk factors. CAC scoring is reasonable to  use in the decision to withhold, postpone, or initiate statin therapy in intermediate-risk or selected borderline-risk asymptomatic adults (age 23-75 years and LDL-C >=70 to <190 mg/dL) who do not have diabetes or established atherosclerotic cardiovascular disease (ASCVD).* In intermediate-risk (10-year ASCVD risk >=7.5% to <20%) adults or selected borderline-risk (10-year ASCVD risk >=5% to <7.5%) adults in whom a CAC score is measured for the purpose of making a treatment decision the following recommendations have been made:  If CAC = 0, it is reasonable to withhold statin therapy and reassess in 5 to 10 years, as long as higher risk conditions are absent (diabetes mellitus, family history of premature CHD in first degree relatives (males <55 years; females <65 years), cigarette smoking, LDL >=190 mg/dL or other independent risk factors).  If CAC is 1 to 99, it is reasonable to initiate statin therapy for patients ?58 years of age.  If CAC is >=100 or >=75th percentile, it is reasonable to initiate statin therapy at any age.  Cardiology referral should be considered for patients with CAC scores =400 or >=75th percentile.  *2018 AHA/ACC/AACVPR/AAPA/ABC/ACPM/ADA/AGS/APhA/ASPC/NLA/PCNA Guideline on the Management of Blood Cholesterol: A Report of the American College of Cardiology/American Heart Association Task Force on Clinical Practice Guidelines. J Am Coll Cardiol. 2019;73(24):3168-3209.  Riley Lam, MD   Electronically Signed By: Riley Lam MD On:  05/16/2021 18:33  Narrative EXAM: OVER-READ INTERPRETATION  CT CHEST  The following report is an over-read performed by radiologist Dr. Jeronimo Greaves of [facility] on 05/16/2021. This over-read does not include interpretation of cardiac or coronary anatomy or pathology. The calcium score interpretation by the cardiologist is attached.  COMPARISON:  08/12/2020 chest radiograph from [facility] [place]. Chest CT from 11/01/2013, [facility] hospital also reviewed.  FINDINGS: Vascular: Aortic atherosclerosis. Age advanced aortic valve calcification.  Mediastinum/Nodes: No imaged thoracic adenopathy.  Lungs/Pleura: No pleural fluid. 3 mm right lower lobe pulmonary nodule on 07/03 is not readily apparent on 11/01/2013.  A left lower lobe 3 mm nodule on 13/3 is present in 2014 and considered benign.  Upper Abdomen: Advanced cirrhosis. Mild to moderate hepatic steatosis. Normal imaged portions of the stomach, spleen.  Musculoskeletal: No acute osseous abnormality.  IMPRESSION: 1.  No acute findings in the imaged extracardiac chest. 2.  Aortic Atherosclerosis (ICD10-I70.0). 3. 3 mm right lower lobe pulmonary nodule appears new compared to 2014. No follow-up needed if patient is low-risk. Non-contrast chest CT can be considered in 12 months if patient is high-risk. This recommendation follows the consensus statement: Guidelines for Management of Incidental Pulmonary Nodules Detected on CT Images: From the Fleischner Society 2017; Radiology 2017; 284:228-243. 4. Cirrhosis. 5. Aortic valvular calcifications. Consider echocardiography to evaluate for valvular dysfunction.  Electronically Signed: By: Jeronimo Greaves M.D. On: 05/16/2021 14:35          EKG:  EKG o 02/24/2023 ordered by EP shows sinus rhythm incomplete right bundle branch block.  Recent Labs: 12/07/2022: ALT 66; TSH 1.560 02/01/2023: BUN 18; Creatinine, Ser 1.17; Hemoglobin 16.7; Platelets 199; Potassium 4.6;  Sodium 137  Recent Lipid Panel    Component Value Date/Time   CHOL 141 12/07/2022 1450   TRIG 125 12/07/2022 1450   HDL 48 12/07/2022 1450   CHOLHDL 2.9 12/07/2022 1450   LDLCALC 71 12/07/2022 1450    Physical Exam:    VS:  BP (!) 150/66 (BP Location: Left Arm, Patient Position: Sitting)   Pulse 84   Ht 5\' 5"  (1.651 m)   Wt 211 lb (  95.7 kg)   SpO2 97%   BMI 35.11 kg/m     Wt Readings from Last 3 Encounters:  03/30/23 211 lb (95.7 kg)  02/24/23 211 lb (95.7 kg)  02/18/23 205 lb (93 kg)     GEN:  Well nourished, well developed in no acute distress HEENT: Normal NECK: No JVD; No carotid bruits LYMPHATICS: No lymphadenopathy CARDIAC: RRR, no murmurs, rubs, gallops RESPIRATORY:  Clear to auscultation without rales, wheezing or rhonchi  ABDOMEN: Soft, non-tender, non-distended MUSCULOSKELETAL:  No edema; No deformity  SKIN: Warm and dry NEUROLOGIC:  Alert and oriented x 3 PSYCHIATRIC:  Normal affect    Signed, Norman Herrlich, MD  03/30/2023 2:42 PM    [facility]

## 2023-03-30 ENCOUNTER — Encounter: Payer: Self-pay | Admitting: Cardiology

## 2023-03-30 ENCOUNTER — Ambulatory Visit: Payer: 59 | Attending: Cardiology | Admitting: Cardiology

## 2023-03-30 ENCOUNTER — Telehealth: Payer: Self-pay

## 2023-03-30 VITALS — BP 150/66 | HR 84 | Ht 65.0 in | Wt 211.0 lb

## 2023-03-30 DIAGNOSIS — I484 Atypical atrial flutter: Secondary | ICD-10-CM | POA: Diagnosis not present

## 2023-03-30 DIAGNOSIS — Z952 Presence of prosthetic heart valve: Secondary | ICD-10-CM | POA: Diagnosis not present

## 2023-03-30 DIAGNOSIS — I110 Hypertensive heart disease with heart failure: Secondary | ICD-10-CM | POA: Diagnosis not present

## 2023-03-30 DIAGNOSIS — K7460 Unspecified cirrhosis of liver: Secondary | ICD-10-CM

## 2023-03-30 DIAGNOSIS — E782 Mixed hyperlipidemia: Secondary | ICD-10-CM

## 2023-03-30 NOTE — Telephone Encounter (Signed)
   Pre-operative Risk Assessment    Patient Name: Marcus Hicks  DOB: October 17, 1966 MRN: [id number]     Request for Surgical Clearance    Procedure:   Lumbar Facet Injections L4-S1  Date of Surgery:  Clearance TBD                                 Surgeon:  Dr Manual Meier Group or Practice Name:  [facility] Phone number:  [phone] Fax number:  [phone]   Type of Clearance Requested:   - Pharmacy:  Hold Apixaban (Eliquis) Eliquis   Type of Anesthesia:  None    Additional requests/questions:  Please fax a copy of signed pre-operative clearance form  to the surgeon's office.  Kathie Dike   03/30/2023, 5:09 PM

## 2023-03-30 NOTE — Telephone Encounter (Signed)
Pharmacy please advise on holding Eliquis prior to lumbar facet injections scheduled for TBD. Thank you.

## 2023-03-30 NOTE — Patient Instructions (Signed)
Medication Instructions:  Your physician recommends that you continue on your current medications as directed. Please refer to the Current Medication list given to you today.  *If you need a refill on your cardiac medications before your next appointment, please call your pharmacy*   Lab Work: None If you have labs (blood work) drawn today and your tests are completely normal, you will receive your results only by: MyChart Message (if you have MyChart) OR A paper copy in the mail If you have any lab test that is abnormal or we need to change your treatment, we will call you to review the results.   Testing/Procedures: None   Follow-Up: At [facility], you and your health needs are our priority.  As part of our continuing mission to provide you with exceptional heart care, we have created designated Provider Care Teams.  These Care Teams include your primary Cardiologist (physician) and Advanced Practice Providers (APPs -  Physician Assistants and Nurse Practitioners) who all work together to provide you with the care you need, when you need it.  We recommend signing up for the patient portal called "MyChart".  Sign up information is provided on this After Visit Summary.  MyChart is used to connect with patients for Virtual Visits (Telemedicine).  Patients are able to view lab/test results, encounter notes, upcoming appointments, etc.  Non-urgent messages can be sent to your provider as well.   To learn more about what you can do with MyChart, go to [email or web address].    Your next appointment:   3 month(s)  Provider:   Brian Munley, MD    Other Instructions None  

## 2023-03-31 DIAGNOSIS — Z419 Encounter for procedure for purposes other than remedying health state, unspecified: Secondary | ICD-10-CM | POA: Diagnosis not present

## 2023-04-02 NOTE — Telephone Encounter (Signed)
Will forward to Dr. Dulce Sellar who saw pt on 03/30/23 and clearance request was placed same day. Will also send to pre op APP. Requesting office is calling about status of clearance.

## 2023-04-02 NOTE — Telephone Encounter (Signed)
     Primary Cardiologist: Norman Herrlich, MD  Chart reviewed as part of pre-operative protocol coverage. Given past medical history and time since last visit, based on ACC/AHA guidelines, Yazan Bethancourt would be at acceptable risk for the planned procedure without further cardiovascular testing.   Procedure: Lumbar Facet Injections L4-S1  Date of procedure: TBD     CHA2DS2-VASc Score = 4   This indicates a 4.8% annual risk of stroke. The patient's score is based upon: CHF History: 1 HTN History: 1 Diabetes History: 1 Stroke History: 0 Vascular Disease History: 1 Age Score: 0 Gender Score: 0       CrCl 73 ml/min   Per office protocol, patient can hold Eliquis for 3 days prior to procedure.  I will route this recommendation to the requesting party via Epic fax function and remove from pre-op pool.  Please call with questions.  Thomasene Ripple. Rhythm Gubbels NP-C     04/02/2023, 11:58 AM [facility] [address] Office [phone] Fax [phone]

## 2023-04-02 NOTE — Telephone Encounter (Signed)
     Primary Cardiologist: Norman Herrlich, MD  Chart reviewed as part of pre-operative protocol coverage. Given past medical history and time since last visit, based on ACC/AHA guidelines, Marcus Hicks would be at acceptable risk for the planned procedure without further cardiovascular testing.     I will route this recommendation to the requesting party via Epic fax function and remove from pre-op pool.  Please call with questions.  Thomasene Ripple. Diaz Crago NP-C     04/02/2023, 11:39 AM [facility] [address] Office [phone] Fax [phone]

## 2023-04-02 NOTE — Telephone Encounter (Signed)
Patient with diagnosis of afib on Eliquis for anticoagulation.    Procedure: Lumbar Facet Injections L4-S1  Date of procedure: TBD   CHA2DS2-VASc Score = 4   This indicates a 4.8% annual risk of stroke. The patient's score is based upon: CHF History: 1 HTN History: 1 Diabetes History: 1 Stroke History: 0 Vascular Disease History: 1 Age Score: 0 Gender Score: 0      CrCl 73 ml/min  Per office protocol, patient can hold Eliquis for 3 days prior to procedure.    **This guidance is not considered finalized until pre-operative APP has relayed final recommendations.**

## 2023-04-02 NOTE — Telephone Encounter (Signed)
Caller is following-up on patient's clearance request.

## 2023-04-08 ENCOUNTER — Encounter: Payer: Self-pay | Admitting: Physician Assistant

## 2023-04-08 ENCOUNTER — Ambulatory Visit: Payer: Medicaid Other | Admitting: Physician Assistant

## 2023-04-08 VITALS — BP 132/84 | HR 84 | Temp 97.5°F | Ht 65.0 in | Wt 212.0 lb

## 2023-04-08 DIAGNOSIS — I110 Hypertensive heart disease with heart failure: Secondary | ICD-10-CM

## 2023-04-08 DIAGNOSIS — I10 Essential (primary) hypertension: Secondary | ICD-10-CM | POA: Diagnosis not present

## 2023-04-08 DIAGNOSIS — Z7985 Long-term (current) use of injectable non-insulin antidiabetic drugs: Secondary | ICD-10-CM

## 2023-04-08 DIAGNOSIS — E782 Mixed hyperlipidemia: Secondary | ICD-10-CM

## 2023-04-08 DIAGNOSIS — E1142 Type 2 diabetes mellitus with diabetic polyneuropathy: Secondary | ICD-10-CM

## 2023-04-08 DIAGNOSIS — F5102 Adjustment insomnia: Secondary | ICD-10-CM

## 2023-04-08 DIAGNOSIS — I4819 Other persistent atrial fibrillation: Secondary | ICD-10-CM

## 2023-04-08 DIAGNOSIS — M79662 Pain in left lower leg: Secondary | ICD-10-CM | POA: Diagnosis not present

## 2023-04-08 DIAGNOSIS — E1165 Type 2 diabetes mellitus with hyperglycemia: Secondary | ICD-10-CM | POA: Diagnosis not present

## 2023-04-08 DIAGNOSIS — I5020 Unspecified systolic (congestive) heart failure: Secondary | ICD-10-CM | POA: Diagnosis not present

## 2023-04-08 DIAGNOSIS — M79661 Pain in right lower leg: Secondary | ICD-10-CM

## 2023-04-08 DIAGNOSIS — F419 Anxiety disorder, unspecified: Secondary | ICD-10-CM

## 2023-04-08 MED ORDER — ALPRAZOLAM 0.5 MG PO TABS: ORAL_TABLET | ORAL | 0 refills | Status: DC

## 2023-04-08 MED ORDER — ZOLPIDEM TARTRATE 10 MG PO TABS: 10.0000 mg | ORAL_TABLET | Freq: Every day | ORAL | 0 refills | Status: DC

## 2023-04-08 NOTE — Progress Notes (Signed)
Subjective:  Patient ID: Marcus Hicks, male    DOB: 1966/03/31  Age: 57 y.o. MRN: [id number]  Chief Complaint  Patient presents with   Medical Management of Chronic Issues    HPI  Pt with history of type 2 diabetes mellitus with diabetic polyneuropathy diagnosed in 2010- he is currently taking trulicity 3 mg weekly  Pt states that he has not been watching his diet recently and has not been checking his glucose Last hgb A1c was 8.3 He is overdue for eye exam - states will schedule He is on statin treatment and is currently on ARB  Mixed hyperlipidemia  Pt presents with hyperlipidemia. Compliance with treatment has been fair - The patient is compliant with medications, maintains a low cholesterol diet , follows up as directed , and maintains an exercise regimen . The patient denies experiencing any hypercholesterolemia related symptoms. Currently taking crestor 40mg  qd  Pt currently follows cardiology for heart failure with reduced ejection fraction as well as CAD and atrial fibrillation - his current medications include, lasix, losartan, metoprolol and potassium He is currently not having any chest pain, pedal edema or dyspnea He recently had heart ablasion which he tolerated well --- amiodarone has been discontinued He follows with Dr Dulce Sellar and Dr Elberta Fortis  Pt with history of COPD - he uses Anoro ellipta and albuterol neb as needed - currently not having any breathing issues  Pt with history of anxiety - uses xanax as needed and requests refill of medication  Pt with history of primary insomnia - he used to take [place] but insurance made him switch to [employer] (which he never tried) - states now they will cover ambien and would like refill  Pt complains of feet and lower legs staying discolored and cool.  States when he is active the symptoms improve.  Cardiology thought possibly due to amiodarone (which was stopped ) - however symptoms have persisted  Current Outpatient  Medications on File Prior to Visit  Medication Sig Dispense Refill   acetaminophen (TYLENOL) 500 MG tablet Take 500 mg by mouth every 6 (six) hours as needed for mild pain or fever.     albuterol (PROVENTIL) (2.5 MG/3ML) 0.083% nebulizer solution Take 3 mLs (2.5 mg total) by nebulization every 6 (six) hours as needed for wheezing.     apixaban (ELIQUIS) 5 MG TABS tablet Take 1 tablet (5 mg total) by mouth 2 (two) times daily. 60 tablet 6   Dulaglutide (TRULICITY) 3 MG/0.5ML SOPN INJECT 3 MG AS DIRECTED ONCE A WEEK. 0.5 mL 2   furosemide (LASIX) 20 MG tablet Take 1 tablet (20 mg total) by mouth daily. 90 tablet 1   Insulin Pen Needle (BD PEN NEEDLE NANO 2ND GEN) 32G X 4 MM MISC USE ONCE DAILY 100 each 3   losartan (COZAAR) 50 MG tablet TAKE 1 TABLET(50 MG) BY MOUTH DAILY 90 tablet 2   metoprolol tartrate (LOPRESSOR) 25 MG tablet TAKE 1 TABLET(25 MG) BY MOUTH TWICE DAILY 180 tablet 2   Microlet Lancets MISC 1 each by Does not apply route daily in the afternoon. 100 each 2   potassium chloride SA (KLOR-CON M) 20 MEQ tablet Take 1 tablet (20 mEq total) by mouth 2 (two) times daily. 180 tablet 3   rosuvastatin (CRESTOR) 40 MG tablet TAKE 1 TABLET(40 MG) BY MOUTH DAILY 90 tablet 2   umeclidinium-vilanterol (ANORO ELLIPTA) 62.5-25 MCG/ACT AEPB Inhale 1 puff into the lungs daily. (Patient taking differently: Inhale 1 puff into the lungs  daily as needed (Breathing).) 60 each 0   No current facility-administered medications on file prior to visit.   Past Medical History:  Diagnosis Date   Accretions on teeth 09/22/2021   Acquired thrombophilia (HCC) 08/11/2021   Acute kidney injury (HCC) 06/01/2021   Atrial fibrillation with rapid ventricular response (HCC) 08/12/2020   Bicuspid aortic valve    BMI 38.0-38.9,adult 11/26/2020   Bruxism (teeth grinding) 09/22/2021   Caries 09/22/2021   CHF (congestive heart failure) (HCC)    Chronic hepatitis C virus infection (HCC) 08/03/2013   Chronic periodontitis  09/22/2021   Cirrhosis of liver without ascites, unspecified hepatic cirrhosis type (HCC) 12/25/2021   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   Dyslipidemia 07/03/2015   Dysrhythmia    A Fib   Encounter for follow-up for aortic valve replacement 12/25/2021   Encounter for preoperative dental examination 09/22/2021   Essential hypertension 07/03/2015   Last Assessment & Plan:  Blood pressure is borderline high.  Advised to get better sleep, walk on a regular basis, decrease weight down to about 210 and maintain, and eliminate tobacco.   Excessive dental attrition 09/22/2021   HFrEF (heart failure with reduced ejection fraction) (HCC) 08/22/2020   History of maze procedure 03/31/2022   Hyperactive gag reflex 09/22/2021   Hyperlipidemia 10/12/2016   Impacted third molar tooth 09/22/2021   Incipient enamel caries 09/22/2021   Long term (current) use of anticoagulants 09/22/2021   Long-term insulin use (HCC) 10/12/2016   Morbid obesity (HCC) 10/12/2016   Patient advised to lose down to about 210 pounds and maintained.  Regular walking program recommended.  He knows how to lose weight.   Obesity, unspecified 10/12/2016   Patient advised to lose down to about 210 pounds and maintained.  Regular walking program recommended.  He knows how to lose weight.   Phobia of dental procedure 09/22/2021   Routine general medical examination at a health care facility 01/28/2021   S/P AVR (aortic valve replacement) and aortoplasty 10/28/2021   Sleep apnea    Teeth missing 09/22/2021   Thoracic aortic aneurysm without rupture, unspecified part (HCC) 03/31/2022   Type 2 diabetes mellitus with diabetic polyneuropathy (HCC) 10/12/2016   Past Surgical History:  Procedure Laterality Date   AORTIC ROOT ENLARGEMENT  10/28/2021    Procedure: AORTIC ROOT ENLARGEMENT WITH HEMASHIELD PLATINUM X 30CM;  Surgeon: Corliss Skains, MD;  Location: [facility] OR;  Service: Open Heart Surgery;;   AORTIC VALVE REPLACEMENT N/A 10/28/2021   Procedure: AORTIC VALVE REPLACEMENT (AVR) WITH INSPIRIS RESILIA AORTIC VALVE SIZE ;  Surgeon: Corliss Skains, MD;  Location: [facility] OR;  Service: Open Heart Surgery;  Laterality: N/A;   ATRIAL FIBRILLATION ABLATION N/A 02/18/2023   Procedure: ATRIAL FIBRILLATION ABLATION;  Surgeon: Regan Lemming, MD;  Location: [facility] INVASIVE CV LAB;  Service: Cardiovascular;  Laterality: N/A;   CARDIAC CATHETERIZATION     CARDIOVERSION N/A 09/24/2020   Procedure: CARDIOVERSION;  Surgeon: [name], MD;  Location: [facility] ENDOSCOPY;  Service: Cardiovascular;  Laterality: N/A;   CARDIOVERSION N/A 08/13/2021   Procedure: CARDIOVERSION;  Surgeon: [name], MD;  Location: [facility] ENDOSCOPY;  Service: Cardiovascular;  Laterality: N/A;   CARDIOVERSION N/A 02/26/2022   Procedure: CARDIOVERSION;  Surgeon: Thomasene Ripple, DO;  Location: [facility] ENDOSCOPY;  Service: Cardiovascular;  Laterality: N/A;  CCTA  08/2019   CLIPPING OF ATRIAL APPENDAGE N/A 10/28/2021   Procedure: CLIPPING OF ATRIAL APPENDAGE WITH ATRICLIP ACHV45;  Surgeon: Corliss Skains, MD;  Location: [facility] OR;  Service: Open Heart Surgery;  Laterality: N/A;   MAZE N/A 10/28/2021   Procedure: MAZE;  Surgeon: Corliss Skains, MD;  Location: [facility] OR;  Service: Open Heart Surgery;  Laterality: N/A;   RIGHT/LEFT HEART CATH AND CORONARY ANGIOGRAPHY N/A 09/26/2021   Procedure: RIGHT/LEFT HEART CATH AND CORONARY ANGIOGRAPHY;  Surgeon: [place]az[place], Peter M, MD;  Location: [facility] INVASIVE CV LAB;  Service: Cardiovascular;  Laterality: N/A;   TEE WITHOUT CARDIOVERSION  09/24/2020   Procedure: TRANSESOPHAGEAL ECHOCARDIOGRAM (TEE);  Surgeon: [name], MD;  Location: [facility] ENDOSCOPY;  Service: Cardiovascular;;   TEE WITHOUT CARDIOVERSION N/A  10/28/2021   Procedure: TRANSESOPHAGEAL ECHOCARDIOGRAM (TEE);  Surgeon: Corliss Skains, MD;  Location: [facility] OR;  Service: Open Heart Surgery;  Laterality: N/A;    Family History  Problem Relation Age of Onset   Cancer Maternal Grandfather    Liver disease Sister    Alzheimer's disease Other    Memory loss Other    Hypertension Other    Social History   Socioeconomic History   Marital status: Widowed    Spouse name: Not on file   Number of children: Not on file   Years of education: Not on file   Highest education level: Not on file  Occupational History   Not on file  Tobacco Use   Smoking status: Former    Passive exposure: Past   Smokeless tobacco: Former    Types: Snuff   Tobacco comments:    Former snuff 08/11/2021  Vaping Use   Vaping Use: Never used  Substance and Sexual Activity   Alcohol use: Not Currently   Drug use: Never   Sexual activity: Never  Other Topics Concern   Not on file  Social History Narrative   Not on file   Social Determinants of Health   Financial Resource Strain: Not on file  Food Insecurity: Not on file  Transportation Needs: Not on file  Physical Activity: Not on file  Stress: Not on file  Social Connections: Not on file      Objective:  PHYSICAL EXAM:   VS: BP 132/84 (BP Location: Left Arm, Patient Position: Sitting)   Pulse 84   Temp (!) 97.5 F (36.4 C) (Temporal)   Ht 5\' 5"  (1.651 m)   Wt 212 lb (96.2 kg)   SpO2 97%   BMI 35.28 kg/m   GEN: Well nourished, well developed, in no acute distress  Cardiac: RRR; no murmurs, rubs, or gallops,no edema - lower extremities with rubor noted, pulse noted - faint -  Respiratory:  normal respiratory rate and pattern with no distress - normal breath sounds with no rales, rhonchi, wheezes or rubs MS: no deformity or atrophy  Skin: rubor noted on lower extremities Psych: euthymic mood, appropriate affect and demeanor   Lab Results  Component Value Date   WBC 6.7 02/01/2023    HGB 16.7 02/01/2023   HCT 50.2 02/01/2023   PLT 199 02/01/2023   GLUCOSE 174 (H) 02/01/2023   CHOL 141 12/07/2022   TRIG 125 12/07/2022   HDL 48 12/07/2022   LDLCALC 71 12/07/2022   ALT 66 (H) 12/07/2022   AST 51 (H) 12/07/2022   NA 137 02/01/2023   K 4.6 02/01/2023   CL 94 (L) 02/01/2023   CREATININE 1.17 02/01/2023   BUN 18  02/01/2023   CO2 25 02/01/2023   TSH 1.560 12/07/2022   INR 3.3 (A) 03/24/2022   HGBA1C 8.3 (H) 12/07/2022   MICROALBUR 30 04/09/2021      Assessment & Plan:   Problem List Items Addressed This Visit       Cardiovascular and Mediastinum   Essential hypertension   Relevant Medications   aspirin EC 81 MG tablet   Other Relevant Orders   CBC with Differential/Platelet   Comprehensive metabolic panel   TSH Continue current meds   HFrEF (heart failure with reduced ejection fraction) (HCC)   Relevant Medications   aspirin EC 81 MG tablet Continue follow up with cardiology   Dysrhythmia   Relevant Medications   aspirin EC 81 MG tablet   Other Relevant Orders   TSH Continue meds Follow with cardiology as directed   Coronary artery disease   Relevant Medications   aspirin EC 81 MG tablet     Respiratory   COPD (chronic obstructive pulmonary disease) (HCC) Continue meds        Digestive   Cirrhosis of liver without ascites, unspecified hepatic cirrhosis type (HCC) Continue GI follow up     Endocrine   Type 2 diabetes mellitus with diabetic polyneuropathy (HCC) - Primary   Relevant Medications   ALPRAZolam (XANAX) 0.5 MG tablet   aspirin EC 81 MG tablet   Dulaglutide (TRULICITY) 1.5 MG/0.5ML SOPN   Microlet Lancets MISC   Other Relevant Orders   CBC with Differential/Platelet   Comprehensive metabolic panel   Hemoglobin A1c        Other   Hyperlipidemia   Relevant Medications   aspirin EC 81 MG tablet   Other Relevant Orders   Lipid panel   Other Visit Diagnoses     Adjustment insomnia       Rx for ambien                     .  Meds ordered this encounter  Medications   zolpidem (AMBIEN) 10 MG tablet    Sig: Take 1 tablet (10 mg total) by mouth at bedtime. TAKE 1 TABLET(10 MG) BY MOUTH AT BEDTIME AS NEEDED FOR SLEEP    Dispense:  30 tablet    Refill:  0    Order Specific Question:   Supervising Provider    Answer:   Marianne Sofia [[id number]]   ALPRAZolam (XANAX) 0.5 MG tablet    Sig: 1 po qd prn    Dispense:  30 tablet    Refill:  0    Not to exceed 5 additional fills before 08/30/2023    Order Specific Question:   Supervising Provider    Answer:   Marianne Sofia [[id number]]    Orders Placed This Encounter  Procedures   US ARTERIAL ABI (SCREENING LOWER EXTREMITY)   CBC with Differential/Platelet   Comprehensive metabolic panel   Hemoglobin A1c   Lipid panel     Follow-up: Return in about 4 months (around 08/09/2023) for chronic fasting follow-up.  An After Visit Summary was printed and given to the patient.  Jettie Pagan [facility] [phone]

## 2023-04-09 ENCOUNTER — Other Ambulatory Visit: Payer: Self-pay | Admitting: Physician Assistant

## 2023-04-09 DIAGNOSIS — E1165 Type 2 diabetes mellitus with hyperglycemia: Secondary | ICD-10-CM

## 2023-04-09 LAB — CBC WITH DIFFERENTIAL/PLATELET
Basophils Absolute: 0 10*3/uL (ref 0.0–0.2)
Basos: 1 %
EOS (ABSOLUTE): 0.1 10*3/uL (ref 0.0–0.4)
Eos: 2 %
Hematocrit: 48.9 % (ref 37.5–51.0)
Hemoglobin: 16.7 g/dL (ref 13.0–17.7)
Immature Grans (Abs): 0 10*3/uL (ref 0.0–0.1)
Immature Granulocytes: 0 %
Lymphocytes Absolute: 1.2 10*3/uL (ref 0.7–3.1)
Lymphs: 19 %
MCH: 30.6 pg (ref 26.6–33.0)
MCHC: 34.2 g/dL (ref 31.5–35.7)
MCV: 90 fL (ref 79–97)
Monocytes Absolute: 0.5 10*3/uL (ref 0.1–0.9)
Monocytes: 8 %
Neutrophils Absolute: 4.3 10*3/uL (ref 1.4–7.0)
Neutrophils: 70 %
Platelets: 175 10*3/uL (ref 150–450)
RBC: 5.46 x10E6/uL (ref 4.14–5.80)
RDW: 12.6 % (ref 11.6–15.4)
WBC: 6.1 10*3/uL (ref 3.4–10.8)

## 2023-04-09 LAB — LIPID PANEL
Chol/HDL Ratio: 3.4 ratio (ref 0.0–5.0)
Cholesterol, Total: 152 mg/dL (ref 100–199)
HDL: 45 mg/dL (ref >39)
LDL Chol Calc (NIH): 84 mg/dL (ref 0–99)
Triglycerides: 131 mg/dL (ref 0–149)
VLDL Cholesterol Cal: 23 mg/dL (ref 5–40)

## 2023-04-09 LAB — COMPREHENSIVE METABOLIC PANEL
ALT: 52 IU/L — ABNORMAL HIGH (ref 0–44)
AST: 39 IU/L (ref 0–40)
Albumin/Globulin Ratio: 2 (ref 1.2–2.2)
Albumin: 5.1 g/dL — ABNORMAL HIGH (ref 3.8–4.9)
Alkaline Phosphatase: 67 IU/L (ref 44–121)
BUN/Creatinine Ratio: 14 (ref 9–20)
BUN: 15 mg/dL (ref 6–24)
Bilirubin Total: 0.6 mg/dL (ref 0.0–1.2)
CO2: 28 mmol/L (ref 20–29)
Calcium: 9.7 mg/dL (ref 8.7–10.2)
Chloride: 94 mmol/L — ABNORMAL LOW (ref 96–106)
Creatinine, Ser: 1.09 mg/dL (ref 0.76–1.27)
Globulin, Total: 2.5 g/dL (ref 1.5–4.5)
Glucose: 213 mg/dL — ABNORMAL HIGH (ref 70–99)
Potassium: 4.4 mmol/L (ref 3.5–5.2)
Sodium: 137 mmol/L (ref 134–144)
Total Protein: 7.6 g/dL (ref 6.0–8.5)
eGFR: 79 mL/min/{1.73_m2} (ref >59)

## 2023-04-09 LAB — HEMOGLOBIN A1C
Est. average glucose Bld gHb Est-mCnc: 240 mg/dL
Hgb A1c MFr Bld: 10 % — ABNORMAL HIGH (ref 4.8–5.6)

## 2023-04-09 LAB — CARDIOVASCULAR RISK ASSESSMENT

## 2023-04-09 MED ORDER — EMPAGLIFLOZIN 10 MG PO TABS: 10.0000 mg | ORAL_TABLET | Freq: Every day | ORAL | 2 refills | Status: DC

## 2023-04-10 ENCOUNTER — Other Ambulatory Visit: Payer: Self-pay | Admitting: Cardiology

## 2023-04-13 ENCOUNTER — Other Ambulatory Visit: Payer: Self-pay

## 2023-04-13 DIAGNOSIS — E1165 Type 2 diabetes mellitus with hyperglycemia: Secondary | ICD-10-CM

## 2023-04-13 MED ORDER — EMPAGLIFLOZIN 10 MG PO TABS: 10.0000 mg | ORAL_TABLET | Freq: Every day | ORAL | 2 refills | Status: DC

## 2023-04-14 ENCOUNTER — Telehealth: Payer: Self-pay

## 2023-04-14 NOTE — Telephone Encounter (Signed)
   Marcus Hicks has been scheduled for the following appointment:  WHAT: ABI WHERE: [facility] DATE: 04/19/23 TIME: 1:30  Patient has been made aware.

## 2023-04-19 DIAGNOSIS — I10 Essential (primary) hypertension: Secondary | ICD-10-CM | POA: Diagnosis not present

## 2023-04-19 DIAGNOSIS — E119 Type 2 diabetes mellitus without complications: Secondary | ICD-10-CM | POA: Diagnosis not present

## 2023-04-19 DIAGNOSIS — M79661 Pain in right lower leg: Secondary | ICD-10-CM | POA: Diagnosis not present

## 2023-04-20 ENCOUNTER — Other Ambulatory Visit: Payer: Self-pay

## 2023-04-20 ENCOUNTER — Encounter: Payer: Self-pay | Admitting: Physician Assistant

## 2023-04-20 MED ORDER — POTASSIUM CHLORIDE CRYS ER 20 MEQ PO TBCR: 20.0000 meq | EXTENDED_RELEASE_TABLET | Freq: Two times a day (BID) | ORAL | 3 refills | Status: DC

## 2023-04-20 MED ORDER — TRULICITY 4.5 MG/0.5ML ~~LOC~~ SOAJ: 4.5000 mg | SUBCUTANEOUS | 1 refills | Status: DC

## 2023-04-30 ENCOUNTER — Telehealth: Payer: Self-pay

## 2023-04-30 NOTE — Telephone Encounter (Signed)
PA submitted and approved via covermymeds for trulicity. [id number]

## 2023-05-01 DIAGNOSIS — Z419 Encounter for procedure for purposes other than remedying health state, unspecified: Secondary | ICD-10-CM | POA: Diagnosis not present

## 2023-05-05 ENCOUNTER — Other Ambulatory Visit: Payer: Self-pay

## 2023-05-05 DIAGNOSIS — F5102 Adjustment insomnia: Secondary | ICD-10-CM

## 2023-05-05 DIAGNOSIS — F419 Anxiety disorder, unspecified: Secondary | ICD-10-CM

## 2023-05-06 MED ORDER — ALPRAZOLAM 0.5 MG PO TABS: ORAL_TABLET | ORAL | 0 refills | Status: DC

## 2023-05-06 MED ORDER — ZOLPIDEM TARTRATE 10 MG PO TABS: 10.0000 mg | ORAL_TABLET | Freq: Every day | ORAL | 0 refills | Status: DC

## 2023-05-10 ENCOUNTER — Other Ambulatory Visit: Payer: Self-pay | Admitting: Cardiology

## 2023-05-14 ENCOUNTER — Other Ambulatory Visit: Payer: Self-pay | Admitting: Family Medicine

## 2023-05-17 ENCOUNTER — Encounter: Payer: Self-pay | Admitting: Cardiology

## 2023-05-17 ENCOUNTER — Ambulatory Visit: Payer: Medicaid Other | Attending: Cardiology | Admitting: Cardiology

## 2023-05-17 VITALS — BP 118/74 | HR 89 | Ht 65.0 in | Wt 207.4 lb

## 2023-05-17 DIAGNOSIS — I4819 Other persistent atrial fibrillation: Secondary | ICD-10-CM | POA: Diagnosis not present

## 2023-05-17 DIAGNOSIS — D6869 Other thrombophilia: Secondary | ICD-10-CM | POA: Diagnosis not present

## 2023-05-17 NOTE — Progress Notes (Signed)
  Electrophysiology Office Note:   Date:  05/17/2023  ID:  [id number] [place],  [birth year], MRN [id number]  Primary Cardiologist: Norman Herrlich, MD Electrophysiologist: Regan Lemming, MD      History of Present Illness:   Marcus Hicks is a 57 y.o. male with h/o atrial fibrillation, chronic systolic heart failure, coronary artery disease, cirrhosis, thoracic aortic aneurysm and bicuspid aortic valve post valve replacement, surgical maze, left atrial appendage occlusion seen today for routine electrophysiology followup.  Since last being seen in our clinic the patient reports doing well.  He is post atrial fibrillation ablation 02/18/2023.  He has had no further episodes of atrial fibrillation since ablation.  He has been able to do all of his daily activities without restriction.  He has more energy and less fatigue and shortness of breath.  he denies chest pain, palpitations, dyspnea, PND, orthopnea, nausea, vomiting, dizziness, syncope, edema, weight gain, or early satiety.   Review of systems complete and found to be negative unless listed in HPI.   Studies Reviewed:    EKG is ordered today. Personal review shows sinus rhythm   Risk Assessment/Calculations:    CHA2DS2-VASc Score = 4   This indicates a 4.8% annual risk of stroke. The patient's score is based upon: CHF History: 1 HTN History: 1 Diabetes History: 1 Stroke History: 0 Vascular Disease History: 1 Age Score: 0 Gender Score: 0             Physical Exam:   VS:  BP 118/74   Pulse 89   Ht 5\' 5"  (1.651 m)   Wt 207 lb 6.4 oz (94.1 kg)   SpO2 97%   BMI 34.51 kg/m    Wt Readings from Last 3 Encounters:  05/17/23 207 lb 6.4 oz (94.1 kg)  04/08/23 212 lb (96.2 kg)  03/30/23 211 lb (95.7 kg)     GEN: Well nourished, well developed in no acute distress NECK: No JVD; No carotid bruits CARDIAC: Regular rate and rhythm, no murmurs, rubs, gallops RESPIRATORY:  Clear to auscultation without rales, wheezing  or rhonchi  ABDOMEN: Soft, non-tender, non-distended EXTREMITIES:  No edema; No deformity   ASSESSMENT AND PLAN:    1.  Persistent atrial fibrillation/atrial flutter: CHA2DS2-VASc of 2.  Status post ablation 02/18/2023.  He has remained in sinus rhythm since ablation.  Terricka Onofrio continue with current management.  2.  Bicuspid aortic valve: Status post AVR.  Stable on most recent echo.  3.  Chronic systolic and diastolic heart failure: Ejection fraction is normalized.  Plan per primary cardiology.  4.  Secondary hypercoagulable state: Post left atrial appendage clipping at the time of surgery.  Garan Frappier stop Eliquis today.  Follow up with Dr. Elberta Fortis in 6 months  Signed, Shawnte Demarest Jorja Loa, MD

## 2023-05-17 NOTE — Patient Instructions (Signed)
Medication Instructions:  Your physician recommends that you continue on your current medications as directed. Please refer to the Current Medication list given to you today.  *If you need a refill on your cardiac medications before your next appointment, please call your pharmacy*  Lab Work: None ordered If you have labs (blood work) drawn today and your tests are completely normal, you will receive your results only by: . MyChart Message (if you have MyChart) OR . A paper copy in the mail If you have any lab test that is abnormal or we need to change your treatment, we will call you to review the results.  Testing/Procedures: None ordered  Follow-Up: At [facility], you and your health needs are our priority.  As part of our continuing mission to provide you with exceptional heart care, we have created designated Provider Care Teams.  These Care Teams include your primary Cardiologist (physician) and Advanced Practice Providers (APPs -  Physician Assistants and Nurse Practitioners) who all work together to provide you with the care you need, when you need it.  Your next appointment:   6 month(s)  The format for your next appointment:   In Person  Provider:   Will Camnitz, MD  Thank you for choosing [facility]!!   Regena Delucchi, RN [phone]     Other Instructions   

## 2023-05-18 ENCOUNTER — Other Ambulatory Visit: Payer: Self-pay

## 2023-05-18 DIAGNOSIS — E785 Hyperlipidemia, unspecified: Secondary | ICD-10-CM

## 2023-05-18 MED ORDER — ROSUVASTATIN CALCIUM 40 MG PO TABS: ORAL_TABLET | ORAL | 0 refills | Status: DC

## 2023-05-25 ENCOUNTER — Telehealth: Payer: Self-pay | Admitting: *Deleted

## 2023-05-25 NOTE — Telephone Encounter (Signed)
Attempted to reach pt last week unsuccessfully. Pt aware ok to stop Eliquis per Dr. Elberta Fortis, d/t LAA clip. Pt very happy to hear this -- he wondered this when he saw Dr. Elberta Fortis last week. He thanks me for following up.

## 2023-05-31 DIAGNOSIS — Z419 Encounter for procedure for purposes other than remedying health state, unspecified: Secondary | ICD-10-CM | POA: Diagnosis not present

## 2023-06-04 ENCOUNTER — Other Ambulatory Visit: Payer: Self-pay | Admitting: Family Medicine

## 2023-06-04 DIAGNOSIS — F419 Anxiety disorder, unspecified: Secondary | ICD-10-CM

## 2023-06-11 ENCOUNTER — Encounter: Payer: Self-pay | Admitting: Physician Assistant

## 2023-06-28 ENCOUNTER — Telehealth: Payer: Self-pay

## 2023-06-28 NOTE — Telephone Encounter (Signed)
Patient called stating that there was a Social research officer, government authorization required for his Marcus Hicks to be dispensed for 30 days because per insurance he was only able to get 15 per day. Submitted prior auth on patient's ambien. Still awaiting reply for approval or denial.

## 2023-06-28 NOTE — Progress Notes (Signed)
Cardiology Office Note:    Date:  7/Hicks/2024   ID:  [address] Marcus Hicks, Marcus Hicks, Marcus Hicks, MRN [id number]  PCP:  Marianne Sofia, PA-C  Cardiologist:  Norman Herrlich, MD    Referring MD: Marianne Sofia, PA-C    ASSESSMENT:    1. S/P AVR (aortic valve replacement) and aortoplasty   2. History of maze procedure   3. Persistent atrial fibrillation (HCC)   4. Mild CAD   5. Hypertensive heart disease with heart failure (HCC)    PLAN:    In order of problems listed above:  He has made a very good recovery from his aortic valve surgery atrial fibrillation with Maze procedure and guideline directed treatment of heart failure with normalization of his ejection fraction Continue current treatment including his SGLT2 inhibitor ARB beta-blocker and loop diuretic recheck labs BMP proBNP   Next appointment: 6 months   Medication Adjustments/Labs and Tests Ordered: Current medicines are reviewed at length with the patient today.  Concerns regarding medicines are outlined above.  Orders Placed This Encounter  Procedures   Basic Metabolic Panel (BMET)   Pro b natriuretic peptide (BNP)   No orders of the defined types were placed in this encounter.    History of Present Illness:    Marcus Hicks is a 57 y.o. male with a hx of  quite complex heart disease including cardiothoracic surgery 10/28/2021 with surgical aortic valve replacement and aortic root surgery surgical Maze procedure and left atrial appendage occlusion atrial fibrillation previously on amiodarone to maintain sinus rhythm heart failure initiated reduced ejection fraction mild CAD hyperlipidemia hepatic cirrhosis last seen 04/Hicks/2024 maintaining sinus rhythm after EP catheter ablation 02/18/2023 and with normalization of his ejection fraction 50 to 55% echocardiography February 2023.  Compliance with diet, lifestyle and medications: Yes  He really is done remarkably well and continues to improve after his surgery he is no longer  taking amiodarone and is not anticoagulated Previously had severe heart failure and now has no edema shortness of breath chest pain palpitation or syncope Unfortunately still gets quite a sunburn with sun exposure related to amiodarone treatment previously has been off 3 months Past Medical History:  Diagnosis Date   Accretions on teeth 09/22/2021   Acquired thrombophilia (HCC) 08/11/2021   Acute kidney injury (HCC) 06/01/2021   Atrial fibrillation with rapid ventricular response (HCC) 08/12/2020   Bicuspid aortic valve    BMI 38.0-38.9,adult 11/26/2020   Bruxism (teeth grinding) 09/22/2021   Caries 09/22/2021   CHF (congestive heart failure) (HCC)    Chronic hepatitis C virus infection (HCC) 08/03/2013   Chronic periodontitis 09/22/2021   Cirrhosis of liver without ascites, unspecified hepatic cirrhosis type (HCC) 12/25/2021   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   Dyslipidemia 07/03/2015   Dysrhythmia    A Fib   Encounter for follow-up for aortic valve replacement 12/25/2021   Encounter for preoperative dental examination 09/22/2021   Essential hypertension 07/03/2015   Last Assessment & Plan:  Blood pressure is borderline high.  Advised to get better sleep, walk on a regular basis, decrease weight down to about 210 and maintain, and eliminate tobacco.   Excessive dental attrition 09/22/2021   HFrEF (heart failure with reduced ejection fraction) (HCC) 08/22/2020   History of maze procedure 03/31/2022   Hyperactive gag reflex 09/22/2021  Hyperlipidemia 10/12/2016   Impacted third molar tooth 09/22/2021   Incipient enamel caries 09/22/2021   Long term (current) use of anticoagulants 09/22/2021   Long-term insulin use (HCC) 10/12/2016   Morbid obesity (HCC) 10/12/2016   Patient advised to lose down to  about 210 pounds and maintained.  Regular walking program recommended.  He knows how to lose weight.   Obesity, unspecified 10/12/2016   Patient advised to lose down to about 210 pounds and maintained.  Regular walking program recommended.  He knows how to lose weight.   Phobia of dental procedure 09/22/2021   Routine general medical examination at a health care facility 01/28/2021   S/P AVR (aortic valve replacement) and aortoplasty 10/28/2021   Sleep apnea    Teeth missing 09/22/2021   Thoracic aortic aneurysm without rupture, unspecified part (HCC) 03/31/2022   Type 2 diabetes mellitus with diabetic polyneuropathy (HCC) 10/12/2016    Current Medications: Current Meds  Medication Sig   acetaminophen (TYLENOL) 500 MG tablet Take 500 mg by mouth every 6 (six) hours as needed for mild pain or fever.   albuterol (PROVENTIL) (2.5 MG/3ML) 0.083% nebulizer solution Take 3 mLs (2.5 mg total) by nebulization every 6 (six) hours as needed for wheezing.   ALPRAZolam (XANAX) 0.5 MG tablet TAKE 1 TABLET BY MOUTH EVERY DAY AS NEEDED   Dulaglutide (TRULICITY) 4.5 MG/0.5ML SOPN Inject 4.5 mg as directed once a week.   empagliflozin (JARDIANCE) 10 MG TABS tablet Take 1 tablet (10 mg total) by mouth daily before breakfast.   furosemide (LASIX) 20 MG tablet Take 1 tablet (20 mg total) by mouth daily.   Insulin Pen Needle (BD PEN NEEDLE NANO 2ND GEN) 32G X 4 MM MISC USE ONCE DAILY   losartan (COZAAR) 50 MG tablet TAKE 1 TABLET BY MOUTH EVERY DAY   metoprolol tartrate (LOPRESSOR) 25 MG tablet TAKE ONE TABLET BY MOUTH TWICE DAILY   Microlet Lancets MISC 1 each by Does not apply route daily in the afternoon.   potassium chloride SA (KLOR-CON M) 20 MEQ tablet Take 1 tablet (20 mEq total) by mouth 2 (two) times daily.   rosuvastatin (CRESTOR) 40 MG tablet TAKE 1 TABLET(40 MG) BY MOUTH DAILY   umeclidinium-vilanterol (ANORO ELLIPTA) 62.5-25 MCG/ACT AEPB Inhale 1 puff into the lungs daily. (Patient taking differently:  Inhale 1 puff into the lungs daily as needed (Breathing).)   zolpidem (AMBIEN) 10 MG tablet Take 1 tablet (10 mg total) by mouth at bedtime. TAKE 1 TABLET(10 MG) BY MOUTH AT BEDTIME AS NEEDED FOR SLEEP      EKGs/Labs/Other Studies Reviewed:    The following studies were reviewed today:  Cardiac Studies & Procedures   CARDIAC CATHETERIZATION  CARDIAC CATHETERIZATION 09/26/2021  Narrative   Prox LAD to Mid LAD lesion is 35% stenosed.   LV end diastolic pressure is moderately elevated.   Hemodynamic findings consistent with moderate pulmonary hypertension.  Nonobstructive CAD Aortic stenosis. Gradient cannot be accurately measured by pullback given Afib but there is an AV gradient. Moderately elevated LV filling pressures Moderate pulmonary HTN Cardiac index 1.9  Plan: will initiate lasix 20 mg daily. Continue other therapy. CT surgery assessment in process for AVR/Maze/LA ligation.  Findings Coronary Findings Diagnostic  Dominance: Right  Left Main Vessel was injected. Vessel is normal in caliber. Vessel is angiographically normal.  Left Anterior Descending Prox LAD to Mid LAD lesion is 35% stenosed.  Left Circumflex Vessel was injected. Vessel is large. Vessel is angiographically normal.  Right Coronary Artery  Vessel was injected. Vessel is normal in caliber. The vessel exhibits minimal luminal irregularities.  Intervention  No interventions have been documented.     ECHOCARDIOGRAM  ECHOCARDIOGRAM COMPLETE 01/14/2022  Narrative ECHOCARDIOGRAM REPORT    Patient Name:   Marcus Hicks Date of Exam: 01/14/2022 Medical Rec #:  [id number]           Height:       65.0 in Accession #:    [id number]          Weight:       219.0 lb Date of Birth:  Marcus [birth year]           BSA:          2.056 m Patient Age:    56 years            BP:           102/70 mmHg Patient Gender: M                   HR:           95 bpm. Exam Location:  [facility]  Procedure: 2D Echo, Cardiac  Doppler, Color Doppler and Strain Analysis  Indications:    Aortic stenosis I35.0  History:        Patient has no prior history of Echocardiogram examinations. CHF, COPD, S/P AVR, Arrythmias:Atrial Fibrillation; Risk Factors:Diabetes, Dyslipidemia and Hypertension. Aortic Valve: AORTIC VALVE REPLACEMENT (AVR) WITH INSPIRIS RESILIA AORTIC VALVE SIZE (N/A) valve is present in the aortic position. Procedure Date: 10/28/2021.  Sonographer:    Louie Boston RDCS Referring Phys: [id number] Ehan Freas J Idonia Zollinger  IMPRESSIONS   1. Left ventricular ejection fraction, by estimation, is 50 to 55%. The left ventricle has low normal function. The left ventricle has no regional wall motion abnormalities. There is moderate concentric left ventricular hypertrophy. Left ventricular diastolic parameters are indeterminate. 2. Right ventricular systolic function is mildly reduced. The right ventricular size is normal. There is normal pulmonary artery systolic pressure. 3. The mitral valve is normal in structure. No evidence of mitral valve regurgitation. No evidence of mitral stenosis. 4. Aortic valve regurgitation is not visualized. There is a AORTIC VALVE REPLACEMENT (AVR) WITH INSPIRIS RESILIA AORTIC VALVE SIZE (N/A) valve present in the aortic position. Procedure Date: 10/28/2021. Echo findings are consistent with normal structure and function of the aortic valve prosthesis. 5. Aortic Normal DTA. 6. The inferior vena cava is dilated in size with >50% respiratory variability, suggesting right atrial pressure of 8 mmHg.  FINDINGS Left Ventricle: Left ventricular ejection fraction, by estimation, is 50 to 55%. The left ventricle has low normal function. The left ventricle has no regional wall motion abnormalities. Global longitudinal strain performed but not reported based on interpreter judgement due to suboptimal tracking. The left ventricular internal cavity size was normal in size. There is moderate  concentric left ventricular hypertrophy. Left ventricular diastolic parameters are indeterminate. Indeterminate filling pressures.  Right Ventricle: The right ventricular size is normal. No increase in right ventricular wall thickness. Right ventricular systolic function is mildly reduced. There is normal pulmonary artery systolic pressure. The tricuspid regurgitant velocity is 2.45 m/s, and with an assumed right atrial pressure of 8 mmHg, the estimated right ventricular systolic pressure is 32.0 mmHg.  Left Atrium: Left atrial size was normal in size.  Right Atrium: Right atrial size was normal in size.  Pericardium: There is no evidence of pericardial effusion.  Mitral Valve: The mitral valve is normal in structure. No  evidence of mitral valve regurgitation. No evidence of mitral valve stenosis.  Tricuspid Valve: The tricuspid valve is normal in structure. Tricuspid valve regurgitation is mild . No evidence of tricuspid stenosis.  Aortic Valve: Aortic valve regurgitation is not visualized. Aortic valve mean gradient measures 8.0 mmHg. Aortic valve peak gradient measures 15.2 mmHg. Aortic valve area, by VTI measures 1.08 cm. There is a AORTIC VALVE REPLACEMENT (AVR) WITH INSPIRIS RESILIA AORTIC VALVE SIZE (N/A) valve present in the aortic position. Procedure Date: 10/28/2021. Echo findings are consistent with normal structure and function of the aortic valve prosthesis.  Pulmonic Valve: The pulmonic valve was normal in structure. Pulmonic valve regurgitation is not visualized. No evidence of pulmonic stenosis.  Aorta: The aortic root and ascending aorta are structurally normal, with no evidence of dilitation, the aortic arch was not well visualized and Normal DTA.  Venous: A systolic blunting flow pattern is recorded from the right upper pulmonary vein. The inferior vena cava is dilated in size with greater than 50% respiratory variability, suggesting right atrial pressure of 8  mmHg.  IAS/Shunts: No atrial level shunt detected by color flow Doppler.   LEFT VENTRICLE PLAX 2D LVIDd:         4.90 cm     Diastology LVIDs:         3.60 cm     LV e' medial:    5.33 cm/s LV PW:         1.40 cm     LV E/e' medial:  22.2 LV IVS:        1.50 cm     LV e' lateral:   10.Hicks cm/s LVOT diam:     2.00 cm     LV E/e' lateral: 11.5 LV SV:         35 LV SV Index:   17 LVOT Area:     3.14 cm  LV Volumes (MOD) LV vol d, MOD A2C: 64.4 ml LV vol d, MOD A4C: 55.0 ml LV vol s, MOD A2C: 28.5 ml LV vol s, MOD A4C: Hicks.8 ml LV SV MOD A2C:     35.9 ml LV SV MOD A4C:     55.0 ml LV SV MOD BP:      33.0 ml  RIGHT VENTRICLE            IVC RV S prime:     7.62 cm/s  IVC diam: 1.80 cm TAPSE (M-mode): 1.7 cm  LEFT ATRIUM             Index        RIGHT ATRIUM           Index LA diam:        4.50 cm 2.19 cm/m   RA Area:     17.20 cm LA Vol (A2C):   39.5 ml 19.21 ml/m  RA Volume:   51.10 ml  24.86 ml/m LA Vol (A4C):   46.1 ml 22.42 ml/m LA Biplane Vol: 42.9 ml 20.87 ml/m AORTIC VALVE AV Area (Vmax):    1.21 cm AV Area (Vmean):   1.12 cm AV Area (VTI):     1.08 cm AV Vmax:           195.00 cm/s AV Vmean:          134.000 cm/s AV VTI:            0.327 m AV Peak Grad:      15.2 mmHg AV Mean Grad:  8.0 mmHg LVOT Vmax:         75.00 cm/s LVOT Vmean:        47.600 cm/s LVOT VTI:          0.112 m LVOT/AV VTI ratio: 0.34  AORTA Ao Root diam: 2.80 cm Ao Asc diam:  3.40 cm Ao Desc diam: 2.Hicks cm  MITRAL VALVE                TRICUSPID VALVE MV Area (PHT): 5.20 cm     TR Peak grad:   24.0 mmHg MV Decel Time: 146 msec     TR Vmax:        245.00 cm/s MV E velocity: 118.50 cm/s MV A velocity: 84.20 cm/s   SHUNTS MV E/A ratio:  1.41         Systemic VTI:  0.11 m Systemic Diam: 2.00 cm  Norman Herrlich MD Electronically signed by Norman Herrlich MD Signature Date/Time: 01/14/2022/5:09:27 PM    Final   TEE  ECHO INTRAOPERATIVE TEE 10/28/2021  Interpretation Summary    Left ventricle: Normal cavity size and wall thickness. Wall motion is abnormal.   Septum: Patent Foramen Ovale present with left to right shunt visualized by color Doppler.   Left atrium: Patent foramen ovale present with left to right shunting indicated by color flow Doppler.   Aortic valve: The valve is bicuspid. Severe stenosis. Mild regurgitation.   Mitral valve:  Mild mitral annular calcification. Mild regurgitation.   Right ventricle:  Normal wall thickness. Mildly reduced systolic function.   Tricuspid valve: Moderate regurgitation. The tricuspid valve regurgitation jet is directed toward septum.  Report is limited as several images did not come over for post procedure review.    CT SCANS  CT CARDIAC SCORING (SELF PAY ONLY) 05/16/2021  Addendum 05/16/2021  6:35 PM ADDENDUM REPORT: 05/16/2021 18:33  CLINICAL DATA:  Risk stratification: 57 Year-old White Male  EXAM: Coronary Calcium Score  TECHNIQUE: The patient was scanned on a [employer]. Axial non-contrast 3 mm slices were carried out through the heart. The data set was analyzed on a dedicated work station and scored using the Agatson method.  FINDINGS: Non-cardiac: See separate report from [facility].  Ascending Aorta: Normal caliber.  Aortic atherosclerosis noted.  Aortic Valve: Significant aortic valve calcification. Aortic valve calcium score of 1141.  Pericardium: Normal.  Coronary arteries: Normal origins.  Coronary Calcium Score:  Left main: 0  Left anterior descending artery: 53  Left circumflex artery: 0  Right coronary artery: 0  Total: 53  Percentile: 69th for age, sex, and race matched control.  IMPRESSION: 1. Coronary calcium score of 53. This was 69th percentile for age, gender, and race matched controls.  2. Aortic atherosclerosis noted.  3. Aortic valve calcium score of 1141, which is less suggestive of severe aortic stenosis, but recommend correlation with  recent echocardiogram for complete assessment.  RECOMMENDATIONS:  The proposed cut-off value of 1,651 AU yielded a 93 % sensitivity and 75 % specificity in grading AS severity in patients with classical low-flow, low-gradient AS. Proposed different cut-off values to define severe AS for men and women as 2,065 AU and 1,274 AU, respectively. The joint European and American recommendations for the assessment of AS consider the aortic valve calcium score as a continuum - a very high calcium score suggests severe AS and a low calcium score suggests severe AS is unlikely.  Sunday Shams, et al. 2017 ESC/EACTS Guidelines for the management of  valvular heart disease. Eur Heart J 2017;38:2739-91.  Coronary artery calcium (CAC) score is a strong predictor of incident coronary heart disease (CHD) and provides predictive information beyond traditional risk factors. CAC scoring is reasonable to use in the decision to withhold, postpone, or initiate statin therapy in intermediate-risk or selected borderline-risk asymptomatic adults (age 48-75 years and LDL-C >=70 to <190 mg/dL) who do not have diabetes or established atherosclerotic cardiovascular disease (ASCVD).* In intermediate-risk (10-year ASCVD risk >=7.5% to <20%) adults or selected borderline-risk (10-year ASCVD risk >=5% to <7.5%) adults in whom a CAC score is measured for the purpose of making a treatment decision the following recommendations have been made:  If CAC = 0, it is reasonable to withhold statin therapy and reassess in 5 to 10 years, as long as higher risk conditions are absent (diabetes mellitus, family history of premature CHD in first degree relatives (males <55 years; females <65 years), cigarette smoking, LDL >=190 mg/dL or other independent risk factors).  If CAC is 1 to 99, it is reasonable to initiate statin therapy for patients ?58 years of age.  If CAC is >=100 or >=75th percentile, it is  reasonable to initiate statin therapy at any age.  Cardiology referral should be considered for patients with CAC scores =400 or >=75th percentile.  *2018 AHA/ACC/AACVPR/AAPA/ABC/ACPM/ADA/AGS/APhA/ASPC/NLA/PCNA Guideline on the Management of Blood Cholesterol: A Report of the American College of Cardiology/American Heart Association Task Force on Clinical Practice Guidelines. J Am Coll Cardiol. 2019;73(24):3168-3209.  Riley Lam, MD   Electronically Signed By: Riley Lam MD On: 05/16/2021 18:33  Narrative EXAM: OVER-READ INTERPRETATION  CT CHEST  The following report is an over-read performed by radiologist Dr. Jeronimo Greaves of [facility] on 05/16/2021. This over-read does not include interpretation of cardiac or coronary anatomy or pathology. The calcium score interpretation by the cardiologist is attached.  COMPARISON:  08/12/2020 chest radiograph from [facility] [place]. Chest CT from 11/01/2013, [facility] also reviewed.  FINDINGS: Vascular: Aortic atherosclerosis. Age advanced aortic valve calcification.  Mediastinum/Nodes: No imaged thoracic adenopathy.  Lungs/Pleura: No pleural fluid. 3 mm right lower lobe pulmonary nodule on 07/03 is not readily apparent on 11/01/2013.  A left lower lobe 3 mm nodule on 13/3 is present in 2014 and considered benign.  Upper Abdomen: Advanced cirrhosis. Mild to moderate hepatic steatosis. Normal imaged portions of the stomach, spleen.  Musculoskeletal: No acute osseous abnormality.  IMPRESSION: 1.  No acute findings in the imaged extracardiac chest. 2.  Aortic Atherosclerosis (ICD10-I70.0). 3. 3 mm right lower lobe pulmonary nodule appears new compared to 2014. No follow-up needed if patient is low-risk. Non-contrast chest CT can be considered in 12 months if patient is high-risk. This recommendation follows the consensus statement: Guidelines for Management of Incidental Pulmonary  Nodules Detected on CT Images: From the Fleischner Society 2017; Radiology 2017; 284:228-243. 4. Cirrhosis. 5. Aortic valvular calcifications. Consider echocardiography to evaluate for valvular dysfunction.  Electronically Signed: By: Jeronimo Greaves M.D. On: 05/16/2021 14:35              Recent Labs: 12/07/2022: TSH 1.560 04/08/2023: ALT 52; BUN 15; Creatinine, Ser 1.09; Hemoglobin 16.7; Platelets 175; Potassium 4.4; Sodium 137  Recent Lipid Panel    Component Value Date/Time   CHOL 152 04/08/2023 1408   TRIG 131 04/08/2023 1408   HDL 45 04/08/2023 1408   CHOLHDL 3.4 04/08/2023 1408   LDLCALC 84 04/08/2023 1408    Physical Exam:    VS:  BP 130/80 (BP  Location: Left Arm, Patient Position: Sitting, Cuff Size: Normal)   Pulse 90   Ht 5\' 5"  (1.651 m)   Wt 206 lb (93.4 kg)   SpO2 95%   BMI 34.28 kg/m     Wt Readings from Last 3 Encounters:  07/Hicks/24 206 lb (93.4 kg)  05/17/23 207 lb 6.4 oz (94.1 kg)  04/08/23 212 lb (96.2 kg)     GEN:  Well nourished, well developed in no acute distress HEENT: Normal NECK: No JVD; No carotid bruits LYMPHATICS: No lymphadenopathy CARDIAC: RRR, no murmurs, rubs, gallops RESPIRATORY:  Clear to auscultation without rales, wheezing or rhonchi  ABDOMEN: Soft, non-tender, non-distended MUSCULOSKELETAL:  No edema; No deformity  SKIN: Warm and dry NEUROLOGIC:  Alert and oriented x 3 PSYCHIATRIC:  Normal affect    Signed, Norman Herrlich, MD  7/Hicks/2024 3:55 PM    [facility]

## 2023-06-29 ENCOUNTER — Encounter: Payer: Self-pay | Admitting: Cardiology

## 2023-06-29 ENCOUNTER — Telehealth: Payer: Self-pay

## 2023-06-29 ENCOUNTER — Ambulatory Visit: Payer: Medicaid Other | Attending: Cardiology | Admitting: Cardiology

## 2023-06-29 VITALS — BP 130/80 | HR 90 | Ht 65.0 in | Wt 206.0 lb

## 2023-06-29 DIAGNOSIS — I2510 Atherosclerotic heart disease of native coronary artery without angina pectoris: Secondary | ICD-10-CM

## 2023-06-29 DIAGNOSIS — Z9889 Other specified postprocedural states: Secondary | ICD-10-CM

## 2023-06-29 DIAGNOSIS — I4819 Other persistent atrial fibrillation: Secondary | ICD-10-CM | POA: Diagnosis not present

## 2023-06-29 DIAGNOSIS — I110 Hypertensive heart disease with heart failure: Secondary | ICD-10-CM | POA: Diagnosis not present

## 2023-06-29 DIAGNOSIS — Z952 Presence of prosthetic heart valve: Secondary | ICD-10-CM

## 2023-06-29 NOTE — Telephone Encounter (Signed)
Called patient made him aware PA for zolpidem 10 mg was approved by insurance.

## 2023-06-29 NOTE — Patient Instructions (Signed)
Medication Instructions:  Your physician recommends that you continue on your current medications as directed. Please refer to the Current Medication list given to you today.  *If you need a refill on your cardiac medications before your next appointment, please call your pharmacy*   Lab Work: Your physician recommends that you return for lab work in:   Labs today: BMP, Pro BNP  If you have labs (blood work) drawn today and your tests are completely normal, you will receive your results only by: MyChart Message (if you have MyChart) OR A paper copy in the mail If you have any lab test that is abnormal or we need to change your treatment, we will call you to review the results.   Testing/Procedure: None  Follow-Up: At [facility], you and your health needs are our priority.  As part of our continuing mission to provide you with exceptional heart care, we have created designated Provider Care Teams.  These Care Teams include your primary Cardiologist (physician) and Advanced Practice Providers (APPs -  Physician Assistants and Nurse Practitioners) who all work together to provide you with the care you need, when you need it.  We recommend signing up for the patient portal called "MyChart".  Sign up information is provided on this After Visit Summary.  MyChart is used to connect with patients for Virtual Visits (Telemedicine).  Patients are able to view lab/test results, encounter notes, upcoming appointments, etc.  Non-urgent messages can be sent to your provider as well.   To learn more about what you can do with MyChart, go to [email or web address].    Your next appointment:   3 month(s)  Provider:   Norman Herrlich, MD    Other Instructions None

## 2023-06-30 ENCOUNTER — Telehealth: Payer: Self-pay

## 2023-06-30 NOTE — Telephone Encounter (Signed)
-----   Message from [facility] sent at 06/30/2023  7:49 AM EDT ----- Normal or stable result  Good result no changes

## 2023-06-30 NOTE — Telephone Encounter (Signed)
Patient notified through my chart.

## 2023-07-01 DIAGNOSIS — Z419 Encounter for procedure for purposes other than remedying health state, unspecified: Secondary | ICD-10-CM | POA: Diagnosis not present

## 2023-07-09 ENCOUNTER — Other Ambulatory Visit: Payer: Self-pay | Admitting: Family Medicine

## 2023-07-09 DIAGNOSIS — F5102 Adjustment insomnia: Secondary | ICD-10-CM

## 2023-07-13 ENCOUNTER — Ambulatory Visit (INDEPENDENT_AMBULATORY_CARE_PROVIDER_SITE_OTHER): Payer: Medicaid Other

## 2023-07-13 LAB — HM DIABETES EYE EXAM

## 2023-08-01 DIAGNOSIS — Z419 Encounter for procedure for purposes other than remedying health state, unspecified: Secondary | ICD-10-CM | POA: Diagnosis not present

## 2023-08-02 ENCOUNTER — Other Ambulatory Visit: Payer: Self-pay | Admitting: Family Medicine

## 2023-08-02 DIAGNOSIS — E1165 Type 2 diabetes mellitus with hyperglycemia: Secondary | ICD-10-CM

## 2023-08-09 ENCOUNTER — Ambulatory Visit (INDEPENDENT_AMBULATORY_CARE_PROVIDER_SITE_OTHER): Payer: Medicaid Other | Admitting: Physician Assistant

## 2023-08-09 ENCOUNTER — Other Ambulatory Visit: Payer: Self-pay | Admitting: Family Medicine

## 2023-08-09 ENCOUNTER — Encounter: Payer: Self-pay | Admitting: Physician Assistant

## 2023-08-09 VITALS — BP 142/90 | HR 85 | Temp 97.2°F | Ht 65.0 in | Wt 213.0 lb

## 2023-08-09 DIAGNOSIS — F5102 Adjustment insomnia: Secondary | ICD-10-CM | POA: Diagnosis not present

## 2023-08-09 DIAGNOSIS — I10 Essential (primary) hypertension: Secondary | ICD-10-CM | POA: Diagnosis not present

## 2023-08-09 DIAGNOSIS — Z23 Encounter for immunization: Secondary | ICD-10-CM

## 2023-08-09 DIAGNOSIS — Z8619 Personal history of other infectious and parasitic diseases: Secondary | ICD-10-CM | POA: Diagnosis not present

## 2023-08-09 DIAGNOSIS — E1165 Type 2 diabetes mellitus with hyperglycemia: Secondary | ICD-10-CM

## 2023-08-09 DIAGNOSIS — E785 Hyperlipidemia, unspecified: Secondary | ICD-10-CM

## 2023-08-09 DIAGNOSIS — I5020 Unspecified systolic (congestive) heart failure: Secondary | ICD-10-CM

## 2023-08-09 DIAGNOSIS — F419 Anxiety disorder, unspecified: Secondary | ICD-10-CM

## 2023-08-09 DIAGNOSIS — E782 Mixed hyperlipidemia: Secondary | ICD-10-CM | POA: Diagnosis not present

## 2023-08-09 MED ORDER — EMPAGLIFLOZIN 10 MG PO TABS: 10.0000 mg | ORAL_TABLET | Freq: Every day | ORAL | 1 refills | Status: DC

## 2023-08-09 MED ORDER — METOPROLOL TARTRATE 25 MG PO TABS: 25.0000 mg | ORAL_TABLET | Freq: Two times a day (BID) | ORAL | 1 refills | Status: DC

## 2023-08-09 MED ORDER — ROSUVASTATIN CALCIUM 40 MG PO TABS: ORAL_TABLET | ORAL | 1 refills | Status: DC

## 2023-08-09 NOTE — Progress Notes (Signed)
Subjective:  Patient ID: Marcus Hicks, male    DOB: [birth year]  Age: 57 y.o. MRN: [id number]  Chief Complaint  Patient presents with   Medical Management of Chronic Issues    HPI  Pt with history of type 2 diabetes mellitus with diabetic polyneuropathy diagnosed in 2010- he is currently taking trulicity 4.5 mg weekly - states he was off medication for more than 1-2 months (did not notify our office)- he is taking jardiance 10mg  qd - is not checking his glucose regularly either Pt states that he has not been watching his diet recently  Last hgb A1c had elevated to 10 He is up to date on eye exam He is on statin treatment and is currently on ARB  Mixed hyperlipidemia  Pt presents with hyperlipidemia. Compliance with treatment has been fair - The patient is compliant with medications, maintains a low cholesterol diet , follows up as directed , and maintains an exercise regimen . The patient denies experiencing any hypercholesterolemia related symptoms. Currently taking crestor 40mg  qd  Pt currently follows cardiology for heart failure with reduced ejection fraction as well as CAD and atrial fibrillation - his current medications include losartan, metoprolol and potassium - he was also on lasix but pt states he decided to stop that medication on his own about 3 weeks ago (said he thought he was on med for foot swelling and since he wasn't having issues he stopped it) - did explain cardiology has him on this medication for heart failure and recommend to restart and follow up with them as scheduled He is currently not having any chest pain, pedal edema or dyspnea He recently had heart ablasion which he tolerated well --- amiodarone has been discontinued He follows with Dr Dulce Sellar and Dr Elberta Fortis  Pt with history of COPD - he uses Anoro ellipta and albuterol neb as needed - currently not having any breathing issues  Pt with history of anxiety - uses xanax   Pt with history of primary  insomnia - doing better on this medication  Pt would like to get flu shot today  Pt states he has a history of Hepatitis C and states he was treated about 20 years ago - was supposed to follow up at some point but never did but states would like to get GI referral for this issue and touch base again Also has history of liver cirrhosis according to records Says he has not seen GI in several years and wants to establish locally  Current Outpatient Medications on File Prior to Visit  Medication Sig Dispense Refill   acetaminophen (TYLENOL) 500 MG tablet Take 500 mg by mouth every 6 (six) hours as needed for mild pain or fever.     albuterol (PROVENTIL) (2.5 MG/3ML) 0.083% nebulizer solution Take 3 mLs (2.5 mg total) by nebulization every 6 (six) hours as needed for wheezing.     ALPRAZolam (XANAX) 0.5 MG tablet TAKE 1 TABLET BY MOUTH EVERY DAY AS NEEDED 30 tablet 1   Dulaglutide (TRULICITY) 4.5 MG/0.5ML SOPN Inject 4.5 mg as directed once a week. 6 mL 1   empagliflozin (JARDIANCE) 10 MG TABS tablet TAKE 1 TABLET BY MOUTH BEFORE BREAKFAST 90 tablet 0   Insulin Pen Needle (BD PEN NEEDLE NANO 2ND GEN) 32G X 4 MM MISC USE ONCE DAILY 100 each 3   losartan (COZAAR) 50 MG tablet TAKE 1 TABLET BY MOUTH EVERY DAY 30 tablet 2   metoprolol tartrate (LOPRESSOR) 25 MG tablet TAKE ONE  TABLET BY MOUTH TWICE DAILY 60 tablet 2   Microlet Lancets MISC 1 each by Does not apply route daily in the afternoon. 100 each 2   potassium chloride SA (KLOR-CON M) 20 MEQ tablet Take 1 tablet (20 mEq total) by mouth 2 (two) times daily. 180 tablet 3   rosuvastatin (CRESTOR) 40 MG tablet TAKE 1 TABLET(40 MG) BY MOUTH DAILY 90 tablet 0   umeclidinium-vilanterol (ANORO ELLIPTA) 62.5-25 MCG/ACT AEPB Inhale 1 puff into the lungs daily. (Patient taking differently: Inhale 1 puff into the lungs daily as needed (Breathing).) 60 each 0   zolpidem (AMBIEN) 10 MG tablet TAKE 1 TABLET(10 MG) BY MOUTH AT BEDTIME AS NEEDED FOR SLEEP 30  tablet 1   No current facility-administered medications on file prior to visit.   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  Advised to get better sleep, walk on a regular basis, decrease weight down to about 210 and maintain, and eliminate tobacco.   Excessive dental attrition 09/22/2021   HFrEF (heart failure with reduced ejection fraction) (HCC) 08/22/2020   History of maze procedure 03/31/2022   Hyperactive gag reflex 09/22/2021   Hyperlipidemia 10/12/2016   Impacted third molar tooth 09/22/2021   Incipient enamel caries 09/22/2021   Long term (current) use of anticoagulants 09/22/2021   Long-term insulin use (HCC) 10/12/2016   Morbid obesity (HCC) 10/12/2016   Patient advised to lose down to about 210 pounds and maintained.  Regular walking program recommended.  He knows how to lose weight.   Obesity, unspecified 10/12/2016   Patient advised to lose down to about 210 pounds and  maintained.  Regular walking program recommended.  He knows how to lose weight.   Phobia of dental procedure 09/22/2021   Routine general medical examination at a health care facility 01/28/2021   S/P AVR (aortic valve replacement) and aortoplasty 10/28/2021   Sleep apnea    Teeth missing 09/22/2021   Thoracic aortic aneurysm without rupture, unspecified part (HCC) 03/31/2022   Type 2 diabetes mellitus with diabetic polyneuropathy (HCC) 10/12/2016   Past Surgical History:  Procedure Laterality Date   AORTIC ROOT ENLARGEMENT  10/28/2021   Procedure: AORTIC ROOT ENLARGEMENT WITH HEMASHIELD PLATINUM X 30CM;  Surgeon: Corliss Skains, MD;  Location: [facility] OR;  Service: Open Heart Surgery;;   AORTIC VALVE REPLACEMENT N/A 10/28/2021   Procedure: AORTIC VALVE REPLACEMENT (AVR) WITH INSPIRIS RESILIA AORTIC VALVE SIZE ;  Surgeon: Corliss Skains, MD;  Location: [facility] OR;  Service: Open Heart  Surgery;  Laterality: N/A;   ATRIAL FIBRILLATION ABLATION N/A 02/18/2023   Procedure: ATRIAL FIBRILLATION ABLATION;  Surgeon: Regan Lemming, MD;  Location: [facility] INVASIVE CV LAB;  Service: Cardiovascular;  Laterality: N/A;   CARDIAC CATHETERIZATION     CARDIOVERSION N/A 09/24/2020   Procedure: CARDIOVERSION;  Surgeon: [name], MD;  Location: [facility] ENDOSCOPY;  Service: Cardiovascular;  Laterality: N/A;   CARDIOVERSION N/A 08/13/2021   Procedure: CARDIOVERSION;  Surgeon: [name], MD;  Location: [facility] ENDOSCOPY;  Service: Cardiovascular;  Laterality: N/A;   CARDIOVERSION N/A 02/26/2022   Procedure: CARDIOVERSION;  Surgeon: Thomasene Ripple, DO;  Location: [facility] ENDOSCOPY;  Service: Cardiovascular;  Laterality: N/A;   CCTA  08/2019   CLIPPING OF ATRIAL APPENDAGE N/A 10/28/2021   Procedure: CLIPPING OF ATRIAL APPENDAGE WITH ATRICLIP ACHV45;  Surgeon: Corliss Skains, MD;  Location: [facility] OR;  Service: Open Heart Surgery;  Laterality: N/A;   MAZE N/A 10/28/2021   Procedure: MAZE;  Surgeon:  Corliss Skains, MD;  Location: [facility] OR;  Service: Open Heart Surgery;  Laterality: N/A;   RIGHT/LEFT HEART CATH AND CORONARY ANGIOGRAPHY N/A 09/26/2021   Procedure: RIGHT/LEFT HEART CATH AND CORONARY ANGIOGRAPHY;  Surgeon: Swazil[place], Peter M, MD;  Location: [facility] INVASIVE CV LAB;  Service: Cardiovascular;  Laterality: N/A;   TEE WITHOUT CARDIOVERSION  09/24/2020   Procedure: TRANSESOPHAGEAL ECHOCARDIOGRAM (TEE);  Surgeon: [name], MD;  Location: [facility] ENDOSCOPY;  Service: Cardiovascular;;   TEE WITHOUT CARDIOVERSION N/A 10/28/2021   Procedure: TRANSESOPHAGEAL ECHOCARDIOGRAM (TEE);  Surgeon: Corliss Skains, MD;  Location: [facility] OR;  Service: Open Heart Surgery;  Laterality: N/A;    Family History  Problem Relation Age of Onset   Cancer Maternal Grandfather    Liver disease Sister    Alzheimer's disease Other    Memory loss Other    Hypertension Other    Social History   Socioeconomic History   Marital status: Widowed    Spouse name: Not on file   Number of children: Not on file   Years of education: Not on file   Highest education level: Not on file  Occupational History   Not on file  Tobacco Use   Smoking status: Former    Passive exposure: Past   Smokeless tobacco: Former    Types: Snuff   Tobacco comments:    Former snuff 08/11/2021  Vaping Use   Vaping status: Never Used  Substance and Sexual Activity   Alcohol use: Not Currently   Drug use: Never   Sexual activity: Never  Other Topics Concern   Not on file  Social History Narrative   Not on file   Social Determinants of Health   Financial Resource Strain: Low Risk  (08/09/2023)   Overall Financial Resource Strain (CARDIA)    Difficulty of Paying Living Expenses: Not hard at all  Food Insecurity: No Food Insecurity (08/09/2023)   Hunger Vital Sign    Worried About Running Out of Food in the Last Year: Never true    Ran Out of Food in the Last Year: Never true  Transportation Needs: No Transportation  Needs (08/09/2023)   PRAPARE - Administrator, Civil Service (Medical): No    Lack of Transportation (Non-Medical): No  Physical Activity: Insufficiently Active (08/09/2023)   Exercise Vital Sign    Days of Exercise per Week: 4 days    Minutes of Exercise per Session: 30 min  Stress: No Stress Concern Present (08/09/2023)   [employer] of Occupational  Health - Occupational Stress Questionnaire    Feeling of Stress : Not at all  Social Connections: Socially Isolated (08/09/2023)   Social Connection and Isolation Panel [NHANES]    Frequency of Communication with Friends and Family: More than three times a week    Frequency of Social Gatherings with Friends and Family: Three times a week    Attends Religious Services: Never    Active Member of Clubs or Organizations: No    Attends Banker Meetings: Never    Marital Status: Widowed    CONSTITUTIONAL: Negative for chills, fatigue, fever, unintentional weight gain and unintentional weight loss.  E/N/T: Negative for ear pain, nasal congestion and sore throat.  CARDIOVASCULAR: Negative for chest pain, dizziness, palpitations and pedal edema.  RESPIRATORY: Negative for recent cough and dyspnea.  GASTROINTESTINAL: Negative for abdominal pain, acid reflux symptoms, constipation, diarrhea, nausea and vomiting.  MSK: Negative for arthralgias and myalgias.  INTEGUMENTARY: Negative for rash.  NEUROLOGICAL: Negative for dizziness and headaches.  PSYCHIATRIC: Negative for sleep disturbance and to question depression screen.  Negative for depression, negative for anhedonia.       Objective:  PHYSICAL EXAM:   VS: BP (!) 142/90 (BP Location: Left Arm, Patient Position: Sitting, Cuff Size: Large)   Pulse 85   Temp (!) 97.2 F (36.2 C) (Temporal)   Ht 5\' 5"  (1.651 m)   Wt 213 lb (96.6 kg)   SpO2 94%   BMI 35.45 kg/m   GEN: Well nourished, well developed, in no acute distress  Cardiac: RRR; no murmurs, rubs, or  gallops,no edema - no significant varicosities Respiratory:  normal respiratory rate and pattern with no distress - normal breath sounds with no rales, rhonchi, wheezes or rubs GI: normal bowel sounds, no masses or tenderness MS: no deformity or atrophy  Skin: warm and dry, no rash   Psych: euthymic mood, appropriate affect and demeanor    Lab Results  Component Value Date   WBC 6.1 04/08/2023   HGB 16.7 04/08/2023   HCT 48.9 04/08/2023   PLT 175 04/08/2023   GLUCOSE 174 (H) 06/29/2023   CHOL 152 04/08/2023   TRIG 131 04/08/2023   HDL 45 04/08/2023   LDLCALC 84 04/08/2023   ALT 52 (H) 04/08/2023   AST 39 04/08/2023   NA 139 06/29/2023   K 4.4 06/29/2023   CL 96 06/29/2023   CREATININE 1.11 06/29/2023   BUN 17 06/29/2023   CO2 27 06/29/2023   TSH 1.560 12/07/2022   INR 3.3 (A) 03/24/2022   HGBA1C 10.0 (H) 04/08/2023   MICROALBUR 30 04/09/2021      Assessment & Plan:   Problem List Items Addressed This Visit       Cardiovascular and Mediastinum   Essential hypertension   Relevant Medications   aspirin EC 81 MG tablet   Other Relevant Orders   CBC with Differential/Platelet   Comprehensive metabolic panel   TSH Continue current meds   HFrEF (heart failure with reduced ejection fraction) (HCC)   Relevant Medications   aspirin EC 81 MG tablet Continue follow up with cardiology Restart lasix as directed and continue metoprolol   Dysrhythmia   Relevant Medications   aspirin EC 81 MG tablet   Other Relevant Orders   TSH Continue meds Follow with cardiology as directed   Coronary artery disease   Relevant Medications   aspirin EC 81 MG tablet     Respiratory   COPD (chronic obstructive pulmonary disease) (HCC) Continue meds  Digestive   Cirrhosis of liver without ascites, unspecified hepatic cirrhosis type (HCC) Referral made to GI     Endocrine   Type 2 diabetes mellitus with diabetic polyneuropathy (HCC) - Primary   Relevant Medications       aspirin EC 81 MG tablet   Dulaglutide (TRULICITY) 1.5 MG/0.5ML SOPN   Microlet Lancets MISC   Other Relevant Orders   CBC with Differential/Platelet   Comprehensive metabolic panel   Hemoglobin A1c        Other   Hyperlipidemia   Relevant Medications   aspirin EC 81 MG tablet   Other Relevant Orders   Lipid panel   Other Visit Diagnoses     Adjustment insomnia       Rx for ambien         History of hep C Refer to GI           .  No orders of the defined types were placed in this encounter.   No orders of the defined types were placed in this encounter.    Follow-up: No follow-ups on file.  An After Visit Summary was printed and given to the patient.  Jettie Pagan [facility] [phone]

## 2023-08-10 ENCOUNTER — Other Ambulatory Visit: Payer: Self-pay

## 2023-08-10 ENCOUNTER — Other Ambulatory Visit: Payer: Self-pay | Admitting: Physician Assistant

## 2023-08-10 DIAGNOSIS — E1165 Type 2 diabetes mellitus with hyperglycemia: Secondary | ICD-10-CM

## 2023-08-10 LAB — COMPREHENSIVE METABOLIC PANEL
ALT: 65 IU/L — ABNORMAL HIGH (ref 0–44)
AST: 56 IU/L — ABNORMAL HIGH (ref 0–40)
Albumin: 5.2 g/dL — ABNORMAL HIGH (ref 3.8–4.9)
Alkaline Phosphatase: 63 IU/L (ref 44–121)
BUN/Creatinine Ratio: 18 (ref 9–20)
BUN: 17 mg/dL (ref 6–24)
Bilirubin Total: 0.6 mg/dL (ref 0.0–1.2)
CO2: 24 mmol/L (ref 20–29)
Calcium: 9.8 mg/dL (ref 8.7–10.2)
Chloride: 97 mmol/L (ref 96–106)
Creatinine, Ser: 0.95 mg/dL (ref 0.76–1.27)
Globulin, Total: 2.9 g/dL (ref 1.5–4.5)
Glucose: 134 mg/dL — ABNORMAL HIGH (ref 70–99)
Potassium: 4.6 mmol/L (ref 3.5–5.2)
Sodium: 139 mmol/L (ref 134–144)
Total Protein: 8.1 g/dL (ref 6.0–8.5)
eGFR: 93 mL/min/{1.73_m2} (ref >59)

## 2023-08-10 LAB — CBC WITH DIFFERENTIAL/PLATELET
Basophils Absolute: 0 10*3/uL (ref 0.0–0.2)
Basos: 1 %
EOS (ABSOLUTE): 0.1 10*3/uL (ref 0.0–0.4)
Eos: 2 %
Hematocrit: 49.8 % (ref 37.5–51.0)
Hemoglobin: 16.7 g/dL (ref 13.0–17.7)
Immature Grans (Abs): 0 10*3/uL (ref 0.0–0.1)
Immature Granulocytes: 0 %
Lymphocytes Absolute: 1 10*3/uL (ref 0.7–3.1)
Lymphs: 19 %
MCH: 30.4 pg (ref 26.6–33.0)
MCHC: 33.5 g/dL (ref 31.5–35.7)
MCV: 91 fL (ref 79–97)
Monocytes Absolute: 0.5 10*3/uL (ref 0.1–0.9)
Monocytes: 10 %
Neutrophils Absolute: 3.6 10*3/uL (ref 1.4–7.0)
Neutrophils: 68 %
Platelets: 172 10*3/uL (ref 150–450)
RBC: 5.5 x10E6/uL (ref 4.14–5.80)
RDW: 12.3 % (ref 11.6–15.4)
WBC: 5.3 10*3/uL (ref 3.4–10.8)

## 2023-08-10 LAB — LIPID PANEL
Chol/HDL Ratio: 5.1 ratio — ABNORMAL HIGH (ref 0.0–5.0)
Cholesterol, Total: 182 mg/dL (ref 100–199)
HDL: 36 mg/dL — ABNORMAL LOW (ref >39)
LDL Chol Calc (NIH): 102 mg/dL — ABNORMAL HIGH (ref 0–99)
Triglycerides: 256 mg/dL — ABNORMAL HIGH (ref 0–149)
VLDL Cholesterol Cal: 44 mg/dL — ABNORMAL HIGH (ref 5–40)

## 2023-08-10 LAB — HEMOGLOBIN A1C
Est. average glucose Bld gHb Est-mCnc: 226 mg/dL
Hgb A1c MFr Bld: 9.5 % — ABNORMAL HIGH (ref 4.8–5.6)

## 2023-08-10 LAB — TSH: TSH: 0.908 u[IU]/mL (ref 0.450–4.500)

## 2023-08-10 MED ORDER — EMPAGLIFLOZIN 25 MG PO TABS: 25.0000 mg | ORAL_TABLET | Freq: Every day | ORAL | 2 refills | Status: DC

## 2023-08-16 ENCOUNTER — Other Ambulatory Visit: Payer: Self-pay

## 2023-08-16 DIAGNOSIS — F5102 Adjustment insomnia: Secondary | ICD-10-CM

## 2023-08-16 MED ORDER — ZOLPIDEM TARTRATE 10 MG PO TABS: 10.0000 mg | ORAL_TABLET | Freq: Every evening | ORAL | 0 refills | Status: DC | PRN

## 2023-08-31 DIAGNOSIS — Z419 Encounter for procedure for purposes other than remedying health state, unspecified: Secondary | ICD-10-CM | POA: Diagnosis not present

## 2023-09-16 ENCOUNTER — Telehealth: Payer: Self-pay

## 2023-09-16 ENCOUNTER — Other Ambulatory Visit: Payer: Self-pay

## 2023-09-16 DIAGNOSIS — F5102 Adjustment insomnia: Secondary | ICD-10-CM

## 2023-09-16 MED ORDER — ZOLPIDEM TARTRATE 10 MG PO TABS: 10.0000 mg | ORAL_TABLET | Freq: Every evening | ORAL | 0 refills | Status: DC | PRN

## 2023-09-16 NOTE — Telephone Encounter (Signed)
Patient called and stated that he is not able to get 30 days of his Ambien 10 mg was told by [facility] that he could only get 15 days worth due to insurance.  Called patient back and he stated that he got it handle and was given his 30 days supplies after contacting his insurance.

## 2023-09-21 ENCOUNTER — Other Ambulatory Visit: Payer: Self-pay | Admitting: Family Medicine

## 2023-09-29 NOTE — Progress Notes (Unsigned)
by pullback given Afib but there is an AV gradient. Moderately elevated LV filling pressures Moderate pulmonary HTN Cardiac index 1.9  Plan: will initiate lasix 20 mg daily. Continue other therapy. CT surgery assessment in process for AVR/Maze/LA ligation.  Findings Coronary Findings Diagnostic  Dominance: Right  Left Main Vessel was injected. Vessel is normal in caliber. Vessel is angiographically normal.  Left Anterior Descending Prox LAD to Mid LAD lesion is 35% stenosed.  Left Circumflex Vessel was injected. Vessel is large. Vessel is angiographically normal.  Right Coronary Artery Vessel was injected. Vessel is normal in caliber. The vessel exhibits minimal luminal  irregularities.  Intervention  No interventions have been documented.     ECHOCARDIOGRAM  ECHOCARDIOGRAM COMPLETE 01/14/2022  Narrative ECHOCARDIOGRAM REPORT    Patient Name:   Marcus Hicks [facility] Date of Exam: 01/14/2022 Medical Rec #:  [id number]           Height:       65.0 in Accession #:    [id number]          Weight:       219.0 lb Date of Birth:  1966/02/23           BSA:          2.056 m Patient Age:    56 years            BP:           102/70 mmHg Patient Gender: M                   HR:           95 bpm. Exam Location[facility]  [facility]  Procedure: 2D Echo, Cardiac Doppler, Color Doppler and Strain Analysis  Indications:    Aortic stenosis I35.0  History:        Patient has no prior history of Echocardiogram examinations. CHF, COPD, S/P AVR, Arrythmias:Atrial Fibrillation; Risk Factors:Diabetes, Dyslipidemia and Hypertension. Aortic Valve: AORTIC VALVE REPLACEMENT (AVR) WITH INSPIRIS RESILIA AORTIC VALVE SIZE (N/A) valve is present in the aortic position. Procedure Date: 10/28/2021.  Sonographer:    Louie Boston RDCS Referring Phys: [id number] Trystan Eads J Kimiyah Blick  IMPRESSIONS   1. Left ventricular ejection fraction, by estimation, is 50 to 55%. The left ventricle has low normal function. The left ventricle has no regional wall motion abnormalities. There is moderate concentric left ventricular hypertrophy. Left ventricular diastolic parameters are indeterminate. 2. Right ventricular systolic function is mildly reduced. The right ventricular size is normal. There is normal pulmonary artery systolic pressure. 3. The mitral valve is normal in structure. No evidence of mitral valve regurgitation. No evidence of mitral stenosis. 4. Aortic valve regurgitation is not visualized. There is a AORTIC VALVE REPLACEMENT (AVR) WITH INSPIRIS RESILIA AORTIC VALVE SIZE (N/A) valve present in the aortic position. Procedure Date: 10/28/2021. Echo findings are consistent with normal structure  and function of the aortic valve prosthesis. 5. Aortic Normal DTA. 6. The inferior vena cava is dilated in size with >50% respiratory variability, suggesting right atrial pressure of 8 mmHg.  FINDINGS Left Ventricle: Left ventricular ejection fraction, by estimation, is 50 to 55%. The left ventricle has low normal function. The left ventricle has no regional wall motion abnormalities. Global longitudinal strain performed but not reported based on interpreter judgement due to suboptimal tracking. The left ventricular internal cavity size was normal in size. There is moderate concentric left ventricular hypertrophy. Left ventricular diastolic parameters are indeterminate. Indeterminate filling pressures.  Right Ventricle:  by pullback given Afib but there is an AV gradient. Moderately elevated LV filling pressures Moderate pulmonary HTN Cardiac index 1.9  Plan: will initiate lasix 20 mg daily. Continue other therapy. CT surgery assessment in process for AVR/Maze/LA ligation.  Findings Coronary Findings Diagnostic  Dominance: Right  Left Main Vessel was injected. Vessel is normal in caliber. Vessel is angiographically normal.  Left Anterior Descending Prox LAD to Mid LAD lesion is 35% stenosed.  Left Circumflex Vessel was injected. Vessel is large. Vessel is angiographically normal.  Right Coronary Artery Vessel was injected. Vessel is normal in caliber. The vessel exhibits minimal luminal  irregularities.  Intervention  No interventions have been documented.     ECHOCARDIOGRAM  ECHOCARDIOGRAM COMPLETE 01/14/2022  Narrative ECHOCARDIOGRAM REPORT    Patient Name:   Marcus Hicks [facility] Date of Exam: 01/14/2022 Medical Rec #:  [id number]           Height:       65.0 in Accession #:    [id number]          Weight:       219.0 lb Date of Birth:  1966/02/23           BSA:          2.056 m Patient Age:    56 years            BP:           102/70 mmHg Patient Gender: M                   HR:           95 bpm. Exam Location:  [facility]  Procedure: 2D Echo, Cardiac Doppler, Color Doppler and Strain Analysis  Indications:    Aortic stenosis I35.0  History:        Patient has no prior history of Echocardiogram examinations. CHF, COPD, S/P AVR, Arrythmias:Atrial Fibrillation; Risk Factors:Diabetes, Dyslipidemia and Hypertension. Aortic Valve: AORTIC VALVE REPLACEMENT (AVR) WITH INSPIRIS RESILIA AORTIC VALVE SIZE (N/A) valve is present in the aortic position. Procedure Date: 10/28/2021.  Sonographer:    Louie Boston RDCS Referring Phys: [id number] Trystan Eads J Kimiyah Blick  IMPRESSIONS   1. Left ventricular ejection fraction, by estimation, is 50 to 55%. The left ventricle has low normal function. The left ventricle has no regional wall motion abnormalities. There is moderate concentric left ventricular hypertrophy. Left ventricular diastolic parameters are indeterminate. 2. Right ventricular systolic function is mildly reduced. The right ventricular size is normal. There is normal pulmonary artery systolic pressure. 3. The mitral valve is normal in structure. No evidence of mitral valve regurgitation. No evidence of mitral stenosis. 4. Aortic valve regurgitation is not visualized. There is a AORTIC VALVE REPLACEMENT (AVR) WITH INSPIRIS RESILIA AORTIC VALVE SIZE (N/A) valve present in the aortic position. Procedure Date: 10/28/2021. Echo findings are consistent with normal structure  and function of the aortic valve prosthesis. 5. Aortic Normal DTA. 6. The inferior vena cava is dilated in size with >50% respiratory variability, suggesting right atrial pressure of 8 mmHg.  FINDINGS Left Ventricle: Left ventricular ejection fraction, by estimation, is 50 to 55%. The left ventricle has low normal function. The left ventricle has no regional wall motion abnormalities. Global longitudinal strain performed but not reported based on interpreter judgement due to suboptimal tracking. The left ventricular internal cavity size was normal in size. There is moderate concentric left ventricular hypertrophy. Left ventricular diastolic parameters are indeterminate. Indeterminate filling pressures.  Right Ventricle:  by pullback given Afib but there is an AV gradient. Moderately elevated LV filling pressures Moderate pulmonary HTN Cardiac index 1.9  Plan: will initiate lasix 20 mg daily. Continue other therapy. CT surgery assessment in process for AVR/Maze/LA ligation.  Findings Coronary Findings Diagnostic  Dominance: Right  Left Main Vessel was injected. Vessel is normal in caliber. Vessel is angiographically normal.  Left Anterior Descending Prox LAD to Mid LAD lesion is 35% stenosed.  Left Circumflex Vessel was injected. Vessel is large. Vessel is angiographically normal.  Right Coronary Artery Vessel was injected. Vessel is normal in caliber. The vessel exhibits minimal luminal  irregularities.  Intervention  No interventions have been documented.     ECHOCARDIOGRAM  ECHOCARDIOGRAM COMPLETE 01/14/2022  Narrative ECHOCARDIOGRAM REPORT    Patient Name:   Marcus Hicks [facility] Date of Exam: 01/14/2022 Medical Rec #:  [id number]           Height:       65.0 in Accession #:    [id number]          Weight:       219.0 lb Date of Birth:  1966/02/23           BSA:          2.056 m Patient Age:    56 years            BP:           102/70 mmHg Patient Gender: M                   HR:           95 bpm. Exam Location:  [facility]  Procedure: 2D Echo, Cardiac Doppler, Color Doppler and Strain Analysis  Indications:    Aortic stenosis I35.0  History:        Patient has no prior history of Echocardiogram examinations. CHF, COPD, S/P AVR, Arrythmias:Atrial Fibrillation; Risk Factors:Diabetes, Dyslipidemia and Hypertension. Aortic Valve: AORTIC VALVE REPLACEMENT (AVR) WITH INSPIRIS RESILIA AORTIC VALVE SIZE (N/A) valve is present in the aortic position. Procedure Date: 10/28/2021.  Sonographer:    Louie Boston RDCS Referring Phys: [id number] Trystan Eads J Kimiyah Blick  IMPRESSIONS   1. Left ventricular ejection fraction, by estimation, is 50 to 55%. The left ventricle has low normal function. The left ventricle has no regional wall motion abnormalities. There is moderate concentric left ventricular hypertrophy. Left ventricular diastolic parameters are indeterminate. 2. Right ventricular systolic function is mildly reduced. The right ventricular size is normal. There is normal pulmonary artery systolic pressure. 3. The mitral valve is normal in structure. No evidence of mitral valve regurgitation. No evidence of mitral stenosis. 4. Aortic valve regurgitation is not visualized. There is a AORTIC VALVE REPLACEMENT (AVR) WITH INSPIRIS RESILIA AORTIC VALVE SIZE (N/A) valve present in the aortic position. Procedure Date: 10/28/2021. Echo findings are consistent with normal structure  and function of the aortic valve prosthesis. 5. Aortic Normal DTA. 6. The inferior vena cava is dilated in size with >50% respiratory variability, suggesting right atrial pressure of 8 mmHg.  FINDINGS Left Ventricle: Left ventricular ejection fraction, by estimation, is 50 to 55%. The left ventricle has low normal function. The left ventricle has no regional wall motion abnormalities. Global longitudinal strain performed but not reported based on interpreter judgement due to suboptimal tracking. The left ventricular internal cavity size was normal in size. There is moderate concentric left ventricular hypertrophy. Left ventricular diastolic parameters are indeterminate. Indeterminate filling pressures.  Right Ventricle:  by pullback given Afib but there is an AV gradient. Moderately elevated LV filling pressures Moderate pulmonary HTN Cardiac index 1.9  Plan: will initiate lasix 20 mg daily. Continue other therapy. CT surgery assessment in process for AVR/Maze/LA ligation.  Findings Coronary Findings Diagnostic  Dominance: Right  Left Main Vessel was injected. Vessel is normal in caliber. Vessel is angiographically normal.  Left Anterior Descending Prox LAD to Mid LAD lesion is 35% stenosed.  Left Circumflex Vessel was injected. Vessel is large. Vessel is angiographically normal.  Right Coronary Artery Vessel was injected. Vessel is normal in caliber. The vessel exhibits minimal luminal  irregularities.  Intervention  No interventions have been documented.     ECHOCARDIOGRAM  ECHOCARDIOGRAM COMPLETE 01/14/2022  Narrative ECHOCARDIOGRAM REPORT    Patient Name:   Marcus Hicks [facility] Date of Exam: 01/14/2022 Medical Rec #:  [id number]           Height:       65.0 in Accession #:    [id number]          Weight:       219.0 lb Date of Birth:  1966/02/23           BSA:          2.056 m Patient Age:    56 years            BP:           102/70 mmHg Patient Gender: M                   HR:           95 bpm. Exam Location:  [facility]  Procedure: 2D Echo, Cardiac Doppler, Color Doppler and Strain Analysis  Indications:    Aortic stenosis I35.0  History:        Patient has no prior history of Echocardiogram examinations. CHF, COPD, S/P AVR, Arrythmias:Atrial Fibrillation; Risk Factors:Diabetes, Dyslipidemia and Hypertension. Aortic Valve: AORTIC VALVE REPLACEMENT (AVR) WITH INSPIRIS RESILIA AORTIC VALVE SIZE (N/A) valve is present in the aortic position. Procedure Date: 10/28/2021.  Sonographer:    Louie Boston RDCS Referring Phys: [id number] Trystan Eads J Kimiyah Blick  IMPRESSIONS   1. Left ventricular ejection fraction, by estimation, is 50 to 55%. The left ventricle has low normal function. The left ventricle has no regional wall motion abnormalities. There is moderate concentric left ventricular hypertrophy. Left ventricular diastolic parameters are indeterminate. 2. Right ventricular systolic function is mildly reduced. The right ventricular size is normal. There is normal pulmonary artery systolic pressure. 3. The mitral valve is normal in structure. No evidence of mitral valve regurgitation. No evidence of mitral stenosis. 4. Aortic valve regurgitation is not visualized. There is a AORTIC VALVE REPLACEMENT (AVR) WITH INSPIRIS RESILIA AORTIC VALVE SIZE (N/A) valve present in the aortic position. Procedure Date: 10/28/2021. Echo findings are consistent with normal structure  and function of the aortic valve prosthesis. 5. Aortic Normal DTA. 6. The inferior vena cava is dilated in size with >50% respiratory variability, suggesting right atrial pressure of 8 mmHg.  FINDINGS Left Ventricle: Left ventricular ejection fraction, by estimation, is 50 to 55%. The left ventricle has low normal function. The left ventricle has no regional wall motion abnormalities. Global longitudinal strain performed but not reported based on interpreter judgement due to suboptimal tracking. The left ventricular internal cavity size was normal in size. There is moderate concentric left ventricular hypertrophy. Left ventricular diastolic parameters are indeterminate. Indeterminate filling pressures.  Right Ventricle:  Cardiology Office Note:    Date:  09/30/2023   ID:  [id number] Subhash Upshur, [place] [birth year], MRN [id number]  PCP:  Marianne Sofia, PA-C  Cardiologist:  Norman Herrlich, MD    Referring MD: Marianne Sofia, PA-C    ASSESSMENT:    1. S/P AVR (aortic valve replacement) and aortoplasty   2. History of maze procedure   3. Status post ligation of left atrial appendage   4. Paroxysmal atrial fibrillation (HCC)   5. Atypical atrial flutter (HCC)   6. Hypertensive heart disease with heart failure (HCC)   7. Mild CAD   8. Mixed hyperlipidemia   9. Cirrhosis of liver without ascites, unspecified hepatic cirrhosis type (HCC)    PLAN:    In order of problems listed above:  He continues to do well after his complex cardiac surgery normal aortic valve function 2 years remote maintaining sinus rhythm after surgical maze and EP catheter ablation not anticoagulated with left atrial appendage occlusion. No longer an antiarrhythmic drug Heart failure is well compensated ejection fraction is recovered continue his medical therapy including low-dose diuretic SGLT2 inhibitor beta-blocker and ARB. Stable CAD no anginal discomfort Continue his high intensity statin Stable liver disease He has symptoms of Raynaud's I offered him a calcium channel blocker he declined.  I asked him to purchase light weight while hiking gloves to wear in the fall to avoid precipitating symptoms   Next appointment: 6 months   Medication Adjustments/Labs and Tests Ordered: Current medicines are reviewed at length with the patient today.  Concerns regarding medicines are outlined above.  Orders Placed This Encounter  Procedures   EKG 12-Lead   No orders of the defined types were placed in this encounter.    History of Present Illness:    Marcus Hicks is a 57 y.o. male with a hx of complex heart disease including cardiothoracic surgery November 2022 with surgical aortic valve replacement aortic root replacement surgical  Maze procedure with left atrial appendage occlusion atrial fibrillation previously on amiodarone now maintaining sinus rhythm after EP catheter ablation in March 2024 to maintain sinus rhythm heart failure initially reduced ejection fraction subsequently normalizing mild CAD hyperlipidemia cirrhosis last seen 06/29/2023.  Compliance with diet, lifestyle and medications: Yes  From a cardiology perspective he has done quite well he thinks he is recovered back to normal no symptoms of heart failure no edema shortness of breath orthopnea no symptoms of CAD no anginal discomfort and no symptoms of atrial fibrillation maintaining sinus rhythm after ablation and with surgical left atrial occlusion and is not anticoagulated. Recent labs were good with the exception A1c quite elevated at 9.5 Lipids at target with LDL 102 cholesterol 182 hemoglobin 16.7 creatinine 0.95 potassium 4.6 Past Medical History:  Diagnosis Date   Accretions on teeth 09/22/2021   Acquired thrombophilia (HCC) 08/11/2021   Acute kidney injury (HCC) 06/01/2021   Atrial fibrillation with rapid ventricular response (HCC) 08/12/2020   Bicuspid aortic valve    BMI 38.0-38.9,adult 11/26/2020   Bruxism (teeth grinding) 09/22/2021   Caries 09/22/2021   CHF (congestive heart failure) (HCC)    Chronic hepatitis C virus infection (HCC) 08/03/2013   Chronic periodontitis 09/22/2021   Cirrhosis of liver without ascites, unspecified hepatic cirrhosis type (HCC) 12/25/2021   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 Subhash Upshur, [place] [birth year], MRN [id number]  PCP:  Marianne Sofia, PA-C  Cardiologist:  Norman Herrlich, MD    Referring MD: Marianne Sofia, PA-C    ASSESSMENT:    1. S/P AVR (aortic valve replacement) and aortoplasty   2. History of maze procedure   3. Status post ligation of left atrial appendage   4. Paroxysmal atrial fibrillation (HCC)   5. Atypical atrial flutter (HCC)   6. Hypertensive heart disease with heart failure (HCC)   7. Mild CAD   8. Mixed hyperlipidemia   9. Cirrhosis of liver without ascites, unspecified hepatic cirrhosis type (HCC)    PLAN:    In order of problems listed above:  He continues to do well after his complex cardiac surgery normal aortic valve function 2 years remote maintaining sinus rhythm after surgical maze and EP catheter ablation not anticoagulated with left atrial appendage occlusion. No longer an antiarrhythmic drug Heart failure is well compensated ejection fraction is recovered continue his medical therapy including low-dose diuretic SGLT2 inhibitor beta-blocker and ARB. Stable CAD no anginal discomfort Continue his high intensity statin Stable liver disease He has symptoms of Raynaud's I offered him a calcium channel blocker he declined.  I asked him to purchase light weight while hiking gloves to wear in the fall to avoid precipitating symptoms   Next appointment: 6 months   Medication Adjustments/Labs and Tests Ordered: Current medicines are reviewed at length with the patient today.  Concerns regarding medicines are outlined above.  Orders Placed This Encounter  Procedures   EKG 12-Lead   No orders of the defined types were placed in this encounter.    History of Present Illness:    Marcus Hicks is a 57 y.o. male with a hx of complex heart disease including cardiothoracic surgery November 2022 with surgical aortic valve replacement aortic root replacement surgical  Maze procedure with left atrial appendage occlusion atrial fibrillation previously on amiodarone now maintaining sinus rhythm after EP catheter ablation in March 2024 to maintain sinus rhythm heart failure initially reduced ejection fraction subsequently normalizing mild CAD hyperlipidemia cirrhosis last seen 06/29/2023.  Compliance with diet, lifestyle and medications: Yes  From a cardiology perspective he has done quite well he thinks he is recovered back to normal no symptoms of heart failure no edema shortness of breath orthopnea no symptoms of CAD no anginal discomfort and no symptoms of atrial fibrillation maintaining sinus rhythm after ablation and with surgical left atrial occlusion and is not anticoagulated. Recent labs were good with the exception A1c quite elevated at 9.5 Lipids at target with LDL 102 cholesterol 182 hemoglobin 16.7 creatinine 0.95 potassium 4.6 Past Medical History:  Diagnosis Date   Accretions on teeth 09/22/2021   Acquired thrombophilia (HCC) 08/11/2021   Acute kidney injury (HCC) 06/01/2021   Atrial fibrillation with rapid ventricular response (HCC) 08/12/2020   Bicuspid aortic valve    BMI 38.0-38.9,adult 11/26/2020   Bruxism (teeth grinding) 09/22/2021   Caries 09/22/2021   CHF (congestive heart failure) (HCC)    Chronic hepatitis C virus infection (HCC) 08/03/2013   Chronic periodontitis 09/22/2021   Cirrhosis of liver without ascites, unspecified hepatic cirrhosis type (HCC) 12/25/2021   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 Subhash Upshur, [place] [birth year], MRN [id number]  PCP:  Marianne Sofia, PA-C  Cardiologist:  Norman Herrlich, MD    Referring MD: Marianne Sofia, PA-C    ASSESSMENT:    1. S/P AVR (aortic valve replacement) and aortoplasty   2. History of maze procedure   3. Status post ligation of left atrial appendage   4. Paroxysmal atrial fibrillation (HCC)   5. Atypical atrial flutter (HCC)   6. Hypertensive heart disease with heart failure (HCC)   7. Mild CAD   8. Mixed hyperlipidemia   9. Cirrhosis of liver without ascites, unspecified hepatic cirrhosis type (HCC)    PLAN:    In order of problems listed above:  He continues to do well after his complex cardiac surgery normal aortic valve function 2 years remote maintaining sinus rhythm after surgical maze and EP catheter ablation not anticoagulated with left atrial appendage occlusion. No longer an antiarrhythmic drug Heart failure is well compensated ejection fraction is recovered continue his medical therapy including low-dose diuretic SGLT2 inhibitor beta-blocker and ARB. Stable CAD no anginal discomfort Continue his high intensity statin Stable liver disease He has symptoms of Raynaud's I offered him a calcium channel blocker he declined.  I asked him to purchase light weight while hiking gloves to wear in the fall to avoid precipitating symptoms   Next appointment: 6 months   Medication Adjustments/Labs and Tests Ordered: Current medicines are reviewed at length with the patient today.  Concerns regarding medicines are outlined above.  Orders Placed This Encounter  Procedures   EKG 12-Lead   No orders of the defined types were placed in this encounter.    History of Present Illness:    Marcus Hicks is a 57 y.o. male with a hx of complex heart disease including cardiothoracic surgery November 2022 with surgical aortic valve replacement aortic root replacement surgical  Maze procedure with left atrial appendage occlusion atrial fibrillation previously on amiodarone now maintaining sinus rhythm after EP catheter ablation in March 2024 to maintain sinus rhythm heart failure initially reduced ejection fraction subsequently normalizing mild CAD hyperlipidemia cirrhosis last seen 06/29/2023.  Compliance with diet, lifestyle and medications: Yes  From a cardiology perspective he has done quite well he thinks he is recovered back to normal no symptoms of heart failure no edema shortness of breath orthopnea no symptoms of CAD no anginal discomfort and no symptoms of atrial fibrillation maintaining sinus rhythm after ablation and with surgical left atrial occlusion and is not anticoagulated. Recent labs were good with the exception A1c quite elevated at 9.5 Lipids at target with LDL 102 cholesterol 182 hemoglobin 16.7 creatinine 0.95 potassium 4.6 Past Medical History:  Diagnosis Date   Accretions on teeth 09/22/2021   Acquired thrombophilia (HCC) 08/11/2021   Acute kidney injury (HCC) 06/01/2021   Atrial fibrillation with rapid ventricular response (HCC) 08/12/2020   Bicuspid aortic valve    BMI 38.0-38.9,adult 11/26/2020   Bruxism (teeth grinding) 09/22/2021   Caries 09/22/2021   CHF (congestive heart failure) (HCC)    Chronic hepatitis C virus infection (HCC) 08/03/2013   Chronic periodontitis 09/22/2021   Cirrhosis of liver without ascites, unspecified hepatic cirrhosis type (HCC) 12/25/2021   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

## 2023-09-30 ENCOUNTER — Encounter: Payer: Self-pay | Admitting: Cardiology

## 2023-09-30 ENCOUNTER — Ambulatory Visit: Payer: Medicaid Other | Attending: Cardiology | Admitting: Cardiology

## 2023-09-30 VITALS — BP 130/80 | HR 99 | Ht 64.0 in | Wt 212.4 lb

## 2023-09-30 DIAGNOSIS — I480 Paroxysmal atrial fibrillation: Secondary | ICD-10-CM | POA: Diagnosis not present

## 2023-09-30 DIAGNOSIS — I110 Hypertensive heart disease with heart failure: Secondary | ICD-10-CM

## 2023-09-30 DIAGNOSIS — Z9889 Other specified postprocedural states: Secondary | ICD-10-CM | POA: Diagnosis not present

## 2023-09-30 DIAGNOSIS — E782 Mixed hyperlipidemia: Secondary | ICD-10-CM

## 2023-09-30 DIAGNOSIS — Z952 Presence of prosthetic heart valve: Secondary | ICD-10-CM

## 2023-09-30 DIAGNOSIS — K7460 Unspecified cirrhosis of liver: Secondary | ICD-10-CM | POA: Diagnosis not present

## 2023-09-30 DIAGNOSIS — I484 Atypical atrial flutter: Secondary | ICD-10-CM | POA: Diagnosis not present

## 2023-09-30 DIAGNOSIS — I2510 Atherosclerotic heart disease of native coronary artery without angina pectoris: Secondary | ICD-10-CM | POA: Diagnosis not present

## 2023-09-30 NOTE — Patient Instructions (Signed)

## 2023-10-01 DIAGNOSIS — Z419 Encounter for procedure for purposes other than remedying health state, unspecified: Secondary | ICD-10-CM | POA: Diagnosis not present

## 2023-10-10 ENCOUNTER — Other Ambulatory Visit: Payer: Self-pay | Admitting: Family Medicine

## 2023-10-10 DIAGNOSIS — J449 Chronic obstructive pulmonary disease, unspecified: Secondary | ICD-10-CM

## 2023-10-16 ENCOUNTER — Other Ambulatory Visit: Payer: Self-pay | Admitting: Physician Assistant

## 2023-10-16 DIAGNOSIS — F419 Anxiety disorder, unspecified: Secondary | ICD-10-CM

## 2023-10-16 DIAGNOSIS — F5102 Adjustment insomnia: Secondary | ICD-10-CM

## 2023-10-20 ENCOUNTER — Encounter: Payer: Self-pay | Admitting: Physician Assistant

## 2023-10-20 ENCOUNTER — Ambulatory Visit: Payer: Medicaid Other | Admitting: Physician Assistant

## 2023-10-20 VITALS — BP 124/78 | HR 83 | Temp 97.3°F | Ht 64.0 in | Wt 206.0 lb

## 2023-10-20 DIAGNOSIS — J060 Acute laryngopharyngitis: Secondary | ICD-10-CM

## 2023-10-20 MED ORDER — BENZONATATE 200 MG PO CAPS: 200.0000 mg | ORAL_CAPSULE | Freq: Three times a day (TID) | ORAL | 0 refills | Status: DC | PRN

## 2023-10-20 MED ORDER — AMOXICILLIN-POT CLAVULANATE 875-125 MG PO TABS: 1.0000 | ORAL_TABLET | Freq: Two times a day (BID) | ORAL | 0 refills | Status: DC

## 2023-10-20 NOTE — Progress Notes (Signed)
Acute Office Visit  Subjective:    Patient ID: Marcus Hicks, male    DOB: [birth year], 57 y.o.   MRN: [id number]  Chief Complaint  Patient presents with    Chest congestion    HPI: Patient is in today for complaints of cough, congestion and pnd for the past few weeks - cough at times has been productive but mostly dry.  Denies fever or malaise.  He is using Anoro but not his nebulizer much Also taking mucinex   Current Outpatient Medications:    acetaminophen (TYLENOL) 500 MG tablet, Take 500 mg by mouth every 6 (six) hours as needed for mild pain or fever., Disp: , Rfl:    albuterol (PROVENTIL) (2.5 MG/3ML) 0.083% nebulizer solution, Take 3 mLs (2.5 mg total) by nebulization every 6 (six) hours as needed for wheezing., Disp: , Rfl:    ALPRAZolam (XANAX) 0.5 MG tablet, TAKE 1 TABLET BY MOUTH EVERY DAY AS NEEDED, Disp: 30 tablet, Rfl: 0   amoxicillin-clavulanate (AUGMENTIN) 875-125 MG tablet, Take 1 tablet by mouth 2 (two) times daily., Disp: 20 tablet, Rfl: 0   ANORO ELLIPTA 62.5-25 MCG/ACT AEPB, INHALE 1 PUFF INTO THE LUNGS DAILY, Disp: 60 each, Rfl: 0   benzonatate (TESSALON) 200 MG capsule, Take 1 capsule (200 mg total) by mouth 3 (three) times daily as needed for cough., Disp: 30 capsule, Rfl: 0   Dulaglutide (TRULICITY) 4.5 MG/0.5ML SOPN, Inject 4.5 mg as directed once a week., Disp: 6 mL, Rfl: 1   empagliflozin (JARDIANCE) 25 MG TABS tablet, Take 1 tablet (25 mg total) by mouth daily before breakfast., Disp: 30 tablet, Rfl: 2   furosemide (LASIX) 20 MG tablet, Take 20 mg by mouth daily., Disp: , Rfl:    Insulin Pen Needle (BD PEN NEEDLE NANO 2ND GEN) 32G X 4 MM MISC, USE ONCE DAILY, Disp: 100 each, Rfl: 3   losartan (COZAAR) 50 MG tablet, TAKE 1 TABLET BY MOUTH EVERY DAY, Disp: 30 tablet, Rfl: 2   metoprolol tartrate (LOPRESSOR) 25 MG tablet, Take 1 tablet (25 mg total) by mouth 2 (two) times daily., Disp: 180 tablet, Rfl: 1   Microlet Lancets MISC, 1 each by Does not apply  route daily in the afternoon., Disp: 100 each, Rfl: 2   potassium chloride SA (KLOR-CON M) 20 MEQ tablet, Take 1 tablet (20 mEq total) by mouth 2 (two) times daily., Disp: 180 tablet, Rfl: 3   rosuvastatin (CRESTOR) 40 MG tablet, TAKE 1 TABLET(40 MG) BY MOUTH DAILY, Disp: 90 tablet, Rfl: 1   zolpidem (AMBIEN) 10 MG tablet, TAKE 1 TABLET(10 MG) BY MOUTH AT BEDTIME AS NEEDED FOR SLEEP, Disp: 30 tablet, Rfl: 0  Allergies  Allergen Reactions   Amiodarone Other (See Comments)    Turned purple    ROS CONSTITUTIONAL: Negative for chills, fatigue, fever,  E/N/T: see HPI CARDIOVASCULAR: Negative for chest pain, dizziness, palpitations and pedal edema.  RESPIRATORY: see HPI GASTROINTESTINAL: Negative for abdominal pain, acid reflux symptoms, constipation, diarrhea, nausea and vomiting.   INTEGUMENTARY: Negative for rash.       Objective:    PHYSICAL EXAM:   BP 124/78 (BP Location: Left Arm, Patient Position: Sitting)   Pulse 83   Temp (!) 97.3 F (36.3 C) (Temporal)   Ht 5\' 4"  (1.626 m)   Wt 206 lb (93.4 kg)   SpO2 93%   BMI 35.36 kg/m    GEN: Well nourished, well developed, in no acute distress  HEENT: normal external ears and nose -  normal external auditory canals and TMS -  - Lips, Teeth and Gums - normal  Oropharynx - mild erythema/pnd Cardiac: RRR; no murmurs, rubs,  Respiratory:  normal respiratory rate and pattern with no distress - normal breath sounds with no rales, rhonchi, wheezes or rubs      Assessment & Plan:    Acute laryngopharyngitis -     Amoxicillin-Pot Clavulanate; Take 1 tablet by mouth 2 (two) times daily.  Dispense: 20 tablet; Refill: 0 -     Benzonatate; Take 1 capsule (200 mg total) by mouth 3 (three) times daily as needed for cough.  Dispense: 30 capsule; Refill: 0     Follow-up: Return if symptoms worsen or fail to improve.  An After Visit Summary was printed and given to the patient.  Jettie Pagan [facility] [phone]

## 2023-10-25 ENCOUNTER — Other Ambulatory Visit: Payer: Self-pay | Admitting: Physician Assistant

## 2023-10-25 DIAGNOSIS — E1165 Type 2 diabetes mellitus with hyperglycemia: Secondary | ICD-10-CM

## 2023-10-27 ENCOUNTER — Telehealth: Payer: Self-pay

## 2023-10-27 NOTE — Telephone Encounter (Signed)
Spoke with patient he stated he is waiting on Dr. Dulce Sellar to fill out his part on his disability forms, and they are suppose to fax it to our office, he went by Dr. Dulce Sellar office today and they stated that Dr. Dulce Sellar still had the paperwork, told him to come back by on Tuesday and it should be ready. Patient was wanting to know does he drop it off with Ivin Booty, PA-C?  Made patient aware that Dr. Dulce Sellar office can fax it to [place] or he can drop it off at the offic when he picks it up from them, and we will call him either way once paperwork is ready from our office.  Copied from CRM [id number]. Topic: Clinical - Medical Advice >> Oct 27, 2023  2:51 PM Joanette Gula wrote: Patient needs some paperwork from Dr. Hulen Shouts office for Dr. Earlene Plater to sign. Mr. Toye wanted to know if his disability papers have been sent to Dr. Earlene Plater yet.

## 2023-10-31 DIAGNOSIS — Z419 Encounter for procedure for purposes other than remedying health state, unspecified: Secondary | ICD-10-CM | POA: Diagnosis not present

## 2023-11-02 IMAGING — DX DG CHEST 1V PORT
1 series · 1 of 1 positions shown · non-contrast
Comparison: Chest radiograph 1 day prior

CLINICAL DATA: Status post aortic valve replacement

EXAM:
PORTABLE CHEST 1 VIEW

[chest]
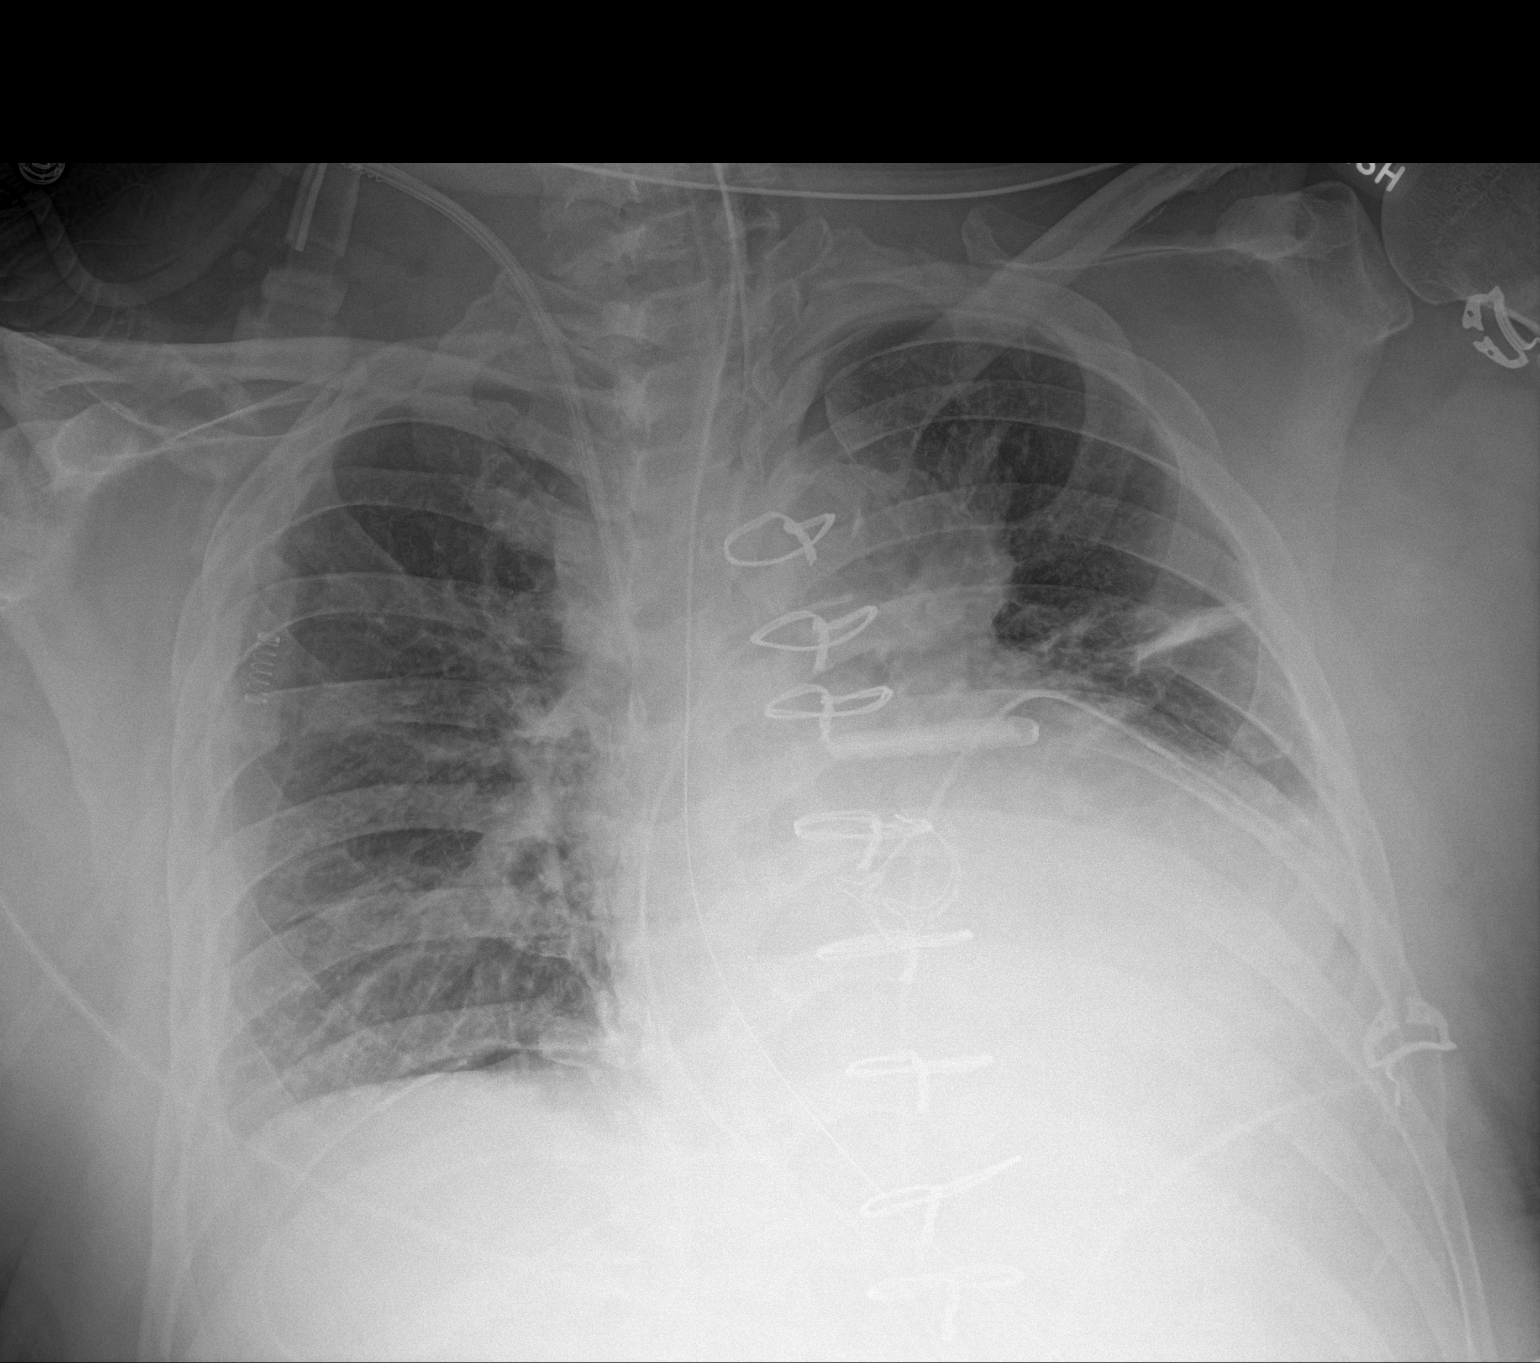

[1 of 1 positions shown; findings below may reference images not displayed]

FINDINGS: The endotracheal tube tip appears to terminate approximately 4.8 cm
from the carina. A right IJ vascular catheter terminates in the
lower SVC. The enteric catheter tip is off the field of view. The
aortic valve replacement is stable. A probable mediastinal drain and
left basilar chest tube are stable.

The cardiomediastinal silhouette is grossly stable.

Linear opacities projecting over the left upper lobe are unchanged
likely reflecting atelectasis. Retrocardiac opacity has slightly
worsened in the interim, most likely reflecting worsening
atelectasis. There is a small right pleural effusion and probable
left pleural effusion, not significantly changed. There is no focal
consolidation on the right. There is no appreciable pneumothorax.

The bones are stable.
IMPRESSION: 1. Lines and support devices as above.
2. Worsening retrocardiac opacity likely reflecting atelectasis.
3. Small right pleural effusion and probable left pleural effusion,
not significantly changed in size.

## 2023-11-04 IMAGING — US US RENAL
1 series · 14 of 25 positions shown · non-contrast
Comparison: CT 06/01/2021

CLINICAL DATA: Renal failure

EXAM:
RENAL / URINARY TRACT ULTRASOUND COMPLETE

[Series 1: us renal · 14 of 42 slices shown]
[im 1/42]
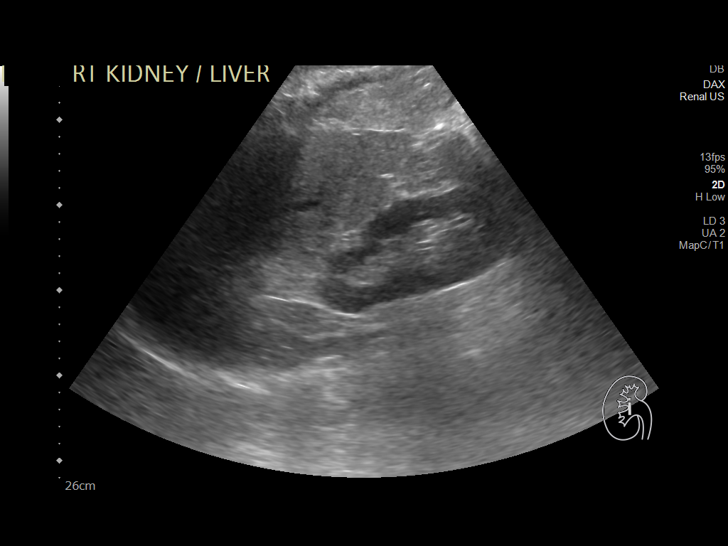
[im 4/42]
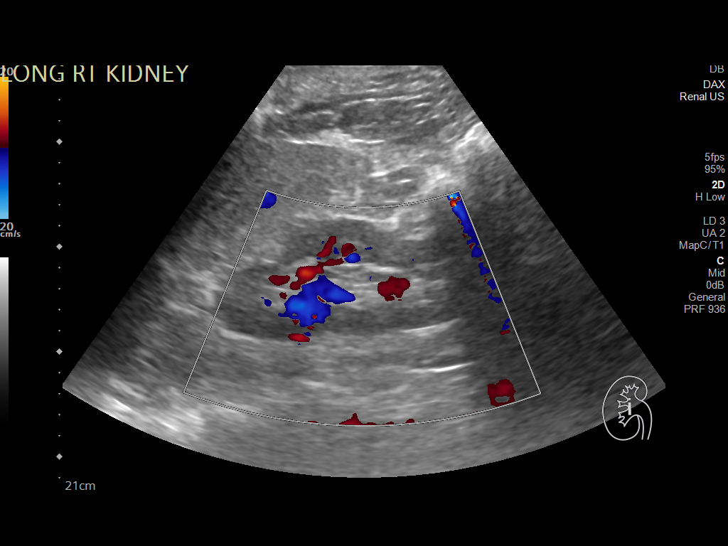
[im 7/42]
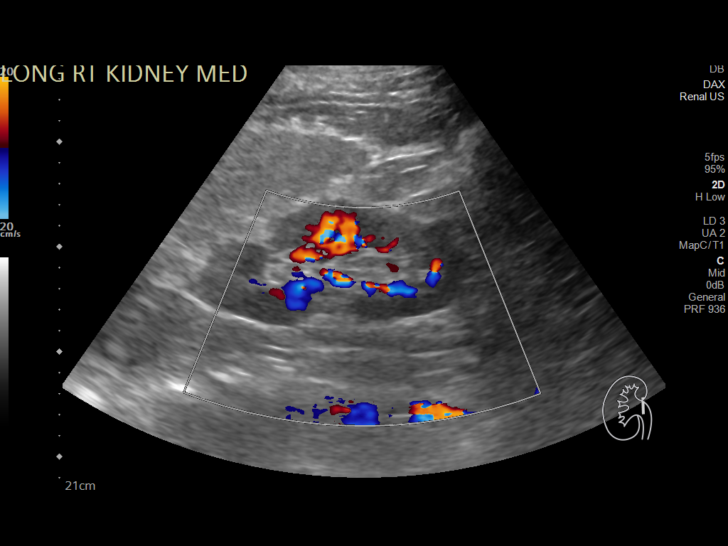
[im 11/42]
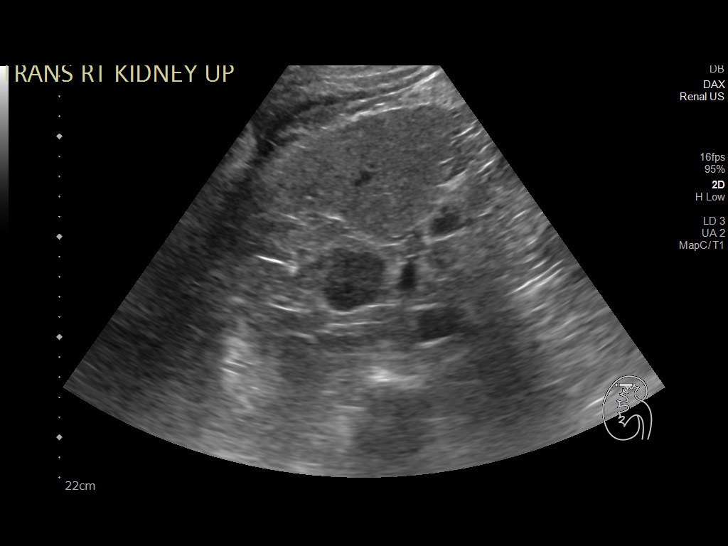
[im 14/42]
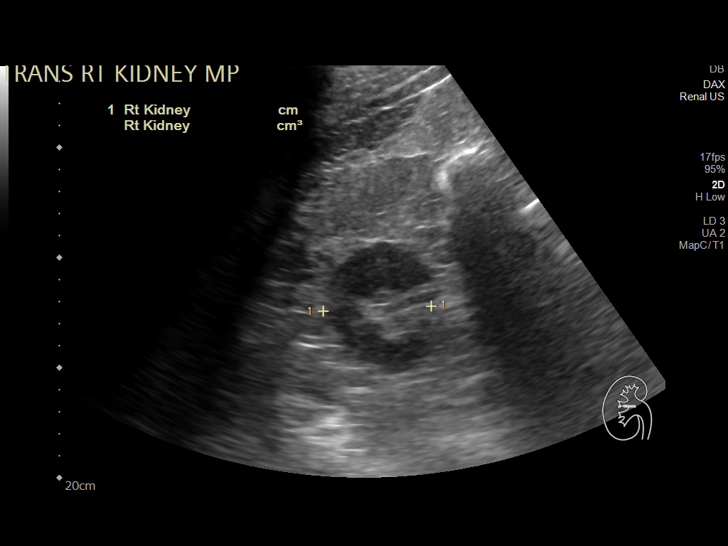
[im 16/42]
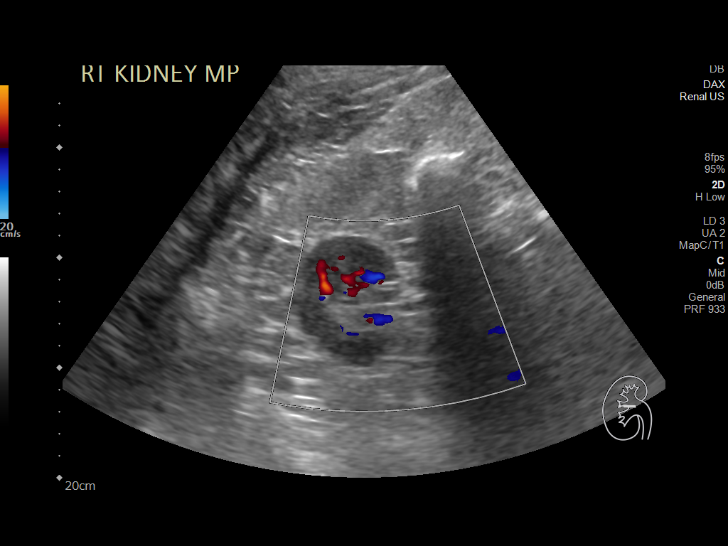
[im 19/42]
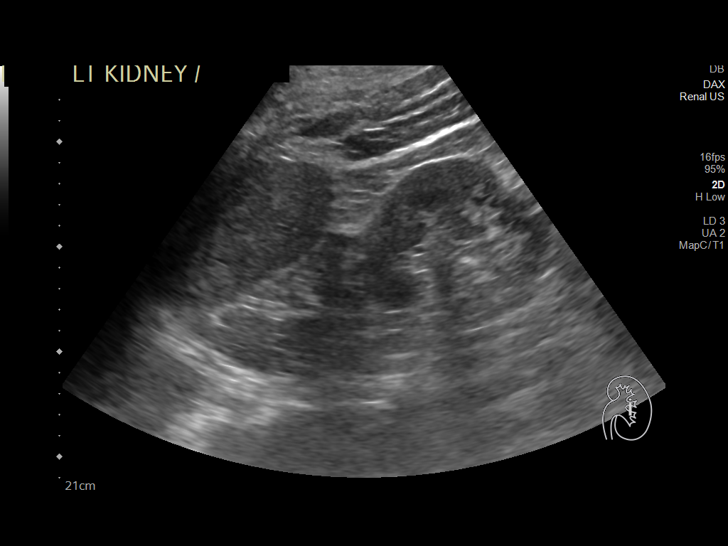
[im 23/42]
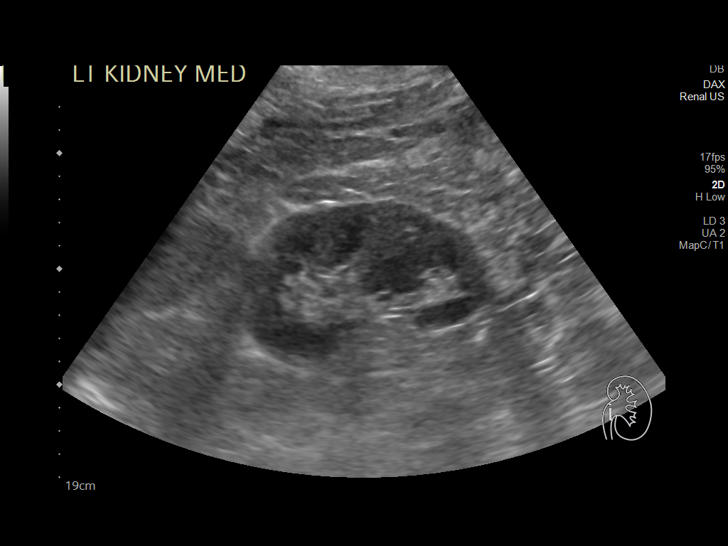
[im 26/42]
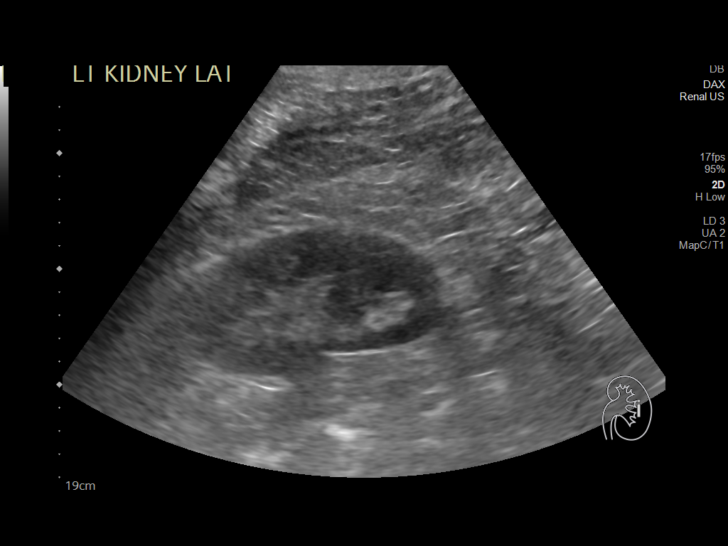
[im 28/42]
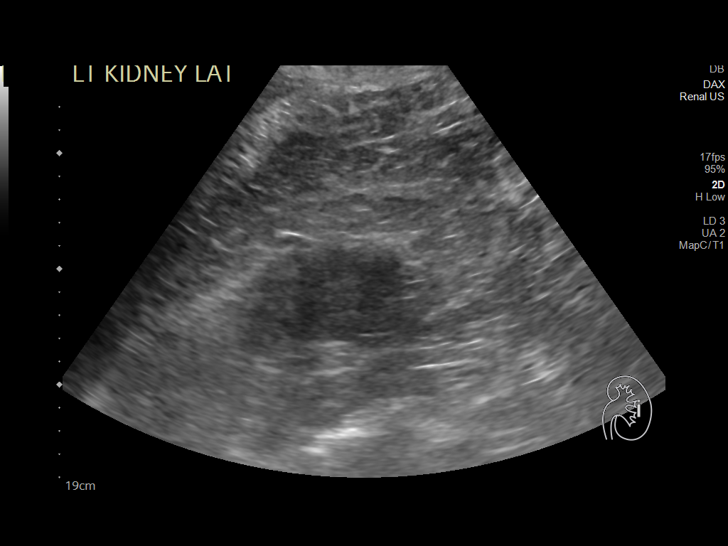
[im 31/42]
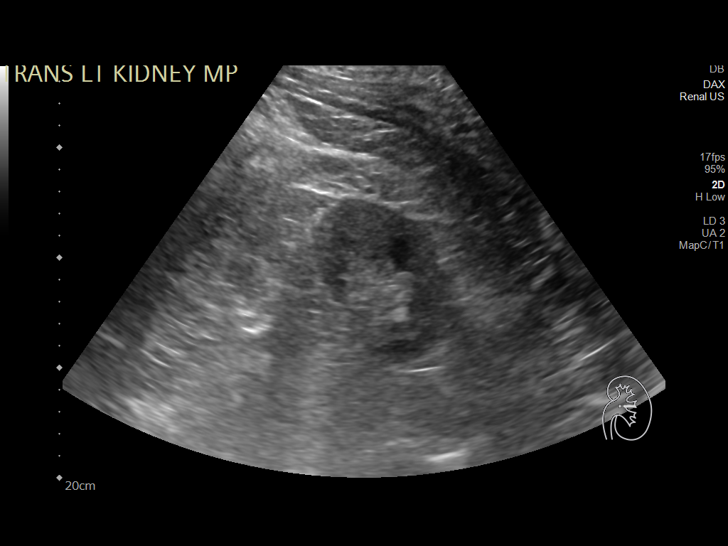
[im 35/42]
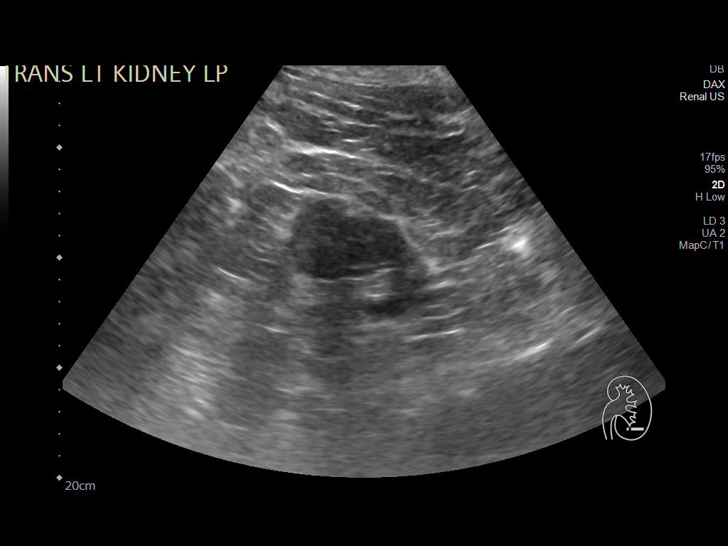
[im 38/42]
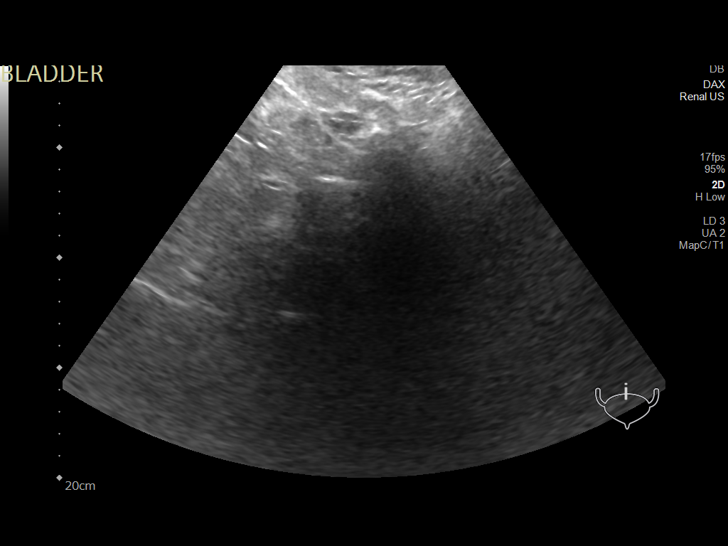
[im 42/42]
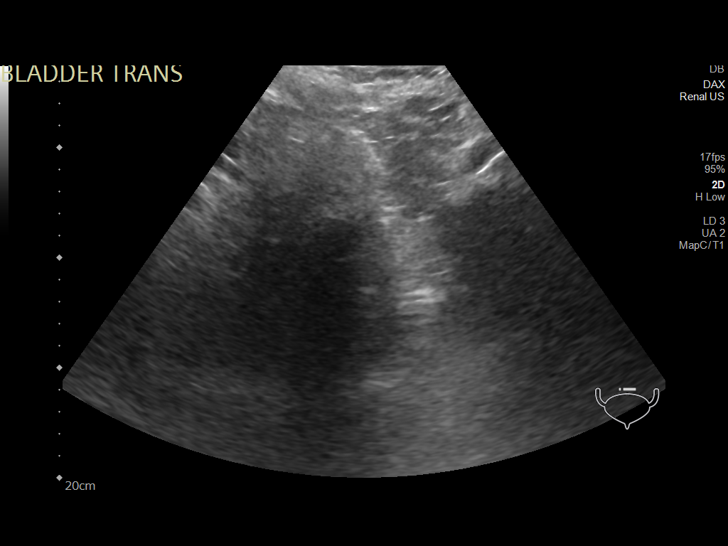

[14 of 25 positions shown; findings below may reference images not displayed]

FINDINGS: Right Kidney:

Renal measurements: 13.5 x 6 x 4.9 cm = volume: 208.4 mL.
Echogenicity within normal limits. No mass or hydronephrosis
visualized.

Left Kidney:

Renal measurements: 10.7 x 5.9 by 5.3 cm = volume: 173 mL.
Echogenicity within normal limits. No mass or hydronephrosis
visualized.

Bladder:

Urinary bladder is empty.  Catheter is present in the bladder

Other:

Nodular liver contour consistent with hepatic cirrhosis.
IMPRESSION: 1. Normal ultrasound appearance of the kidneys.
2. Hepatic cirrhosis

## 2023-11-04 IMAGING — DX DG CHEST 1V PORT
1 series · 1 of 1 positions shown · non-contrast
Comparison: 10/29/2021

CLINICAL DATA: Chest tube, status post open heart surgery

EXAM:
PORTABLE CHEST 1 VIEW

[chest ap]
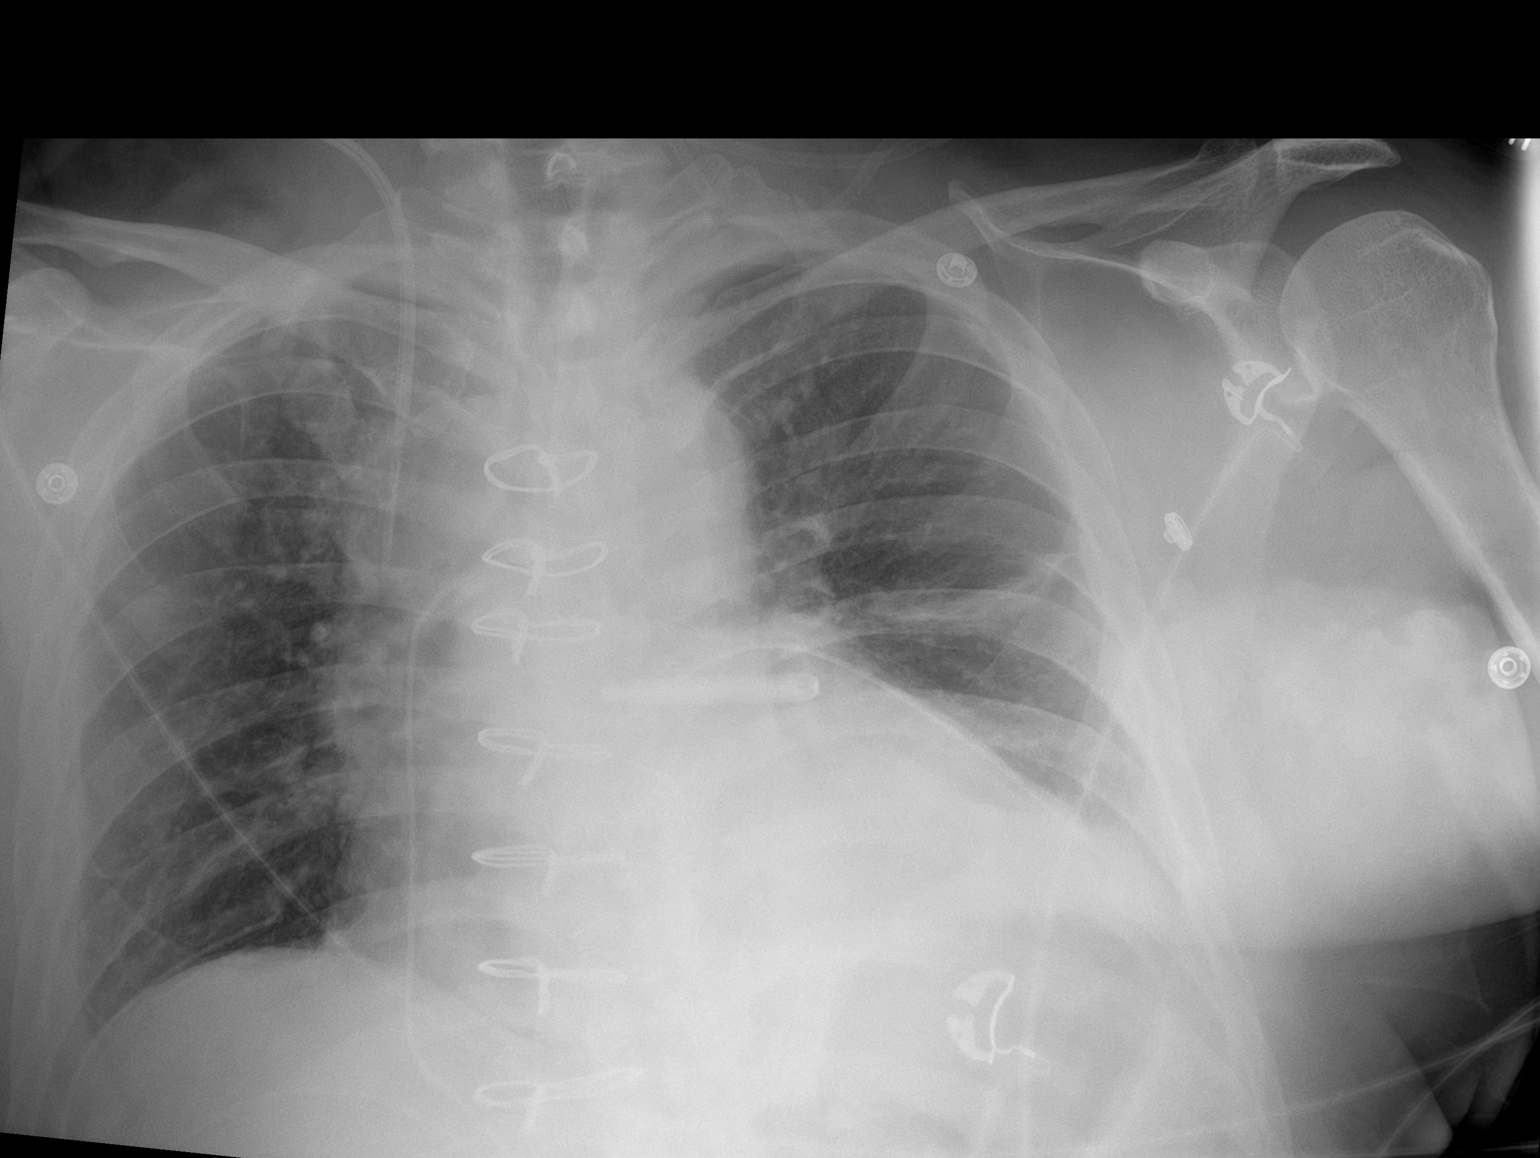

[1 of 1 positions shown; findings below may reference images not displayed]

FINDINGS: Interval endotracheal and esophagogastric extubation. Right neck
vascular catheter remains in position. Cardiomegaly status post
median sternotomy with aortic valvular prosthesis and left atrial
appendage clip. Left chest and mediastinal drainage tubes remain in
position. No pneumothorax. Atelectasis of the left midlung with a
layering left pleural effusion. Mild, diffuse interstitial opacity.
No new or focal airspace opacity.
IMPRESSION: 1. Interval endotracheal and esophagogastric extubation.
2. Cardiomegaly status post median sternotomy with aortic valvular
prosthesis and left atrial appendage clip
3. Atelectasis of the left midlung with a layering left pleural
effusion. No new or focal airspace opacity.
4. Mild edema.

## 2023-11-05 IMAGING — DX DG ABDOMEN 1V
1 series · 2 of 2 positions shown · non-contrast
Comparison: 02/10/2017

CLINICAL DATA: Abdominal pain, possible ileus, initial encounter

EXAM:
ABDOMEN - 1 VIEW

[Series 1: abdomen · 0.14mm/px · 2 of 2 slices shown]
[im 1/2]
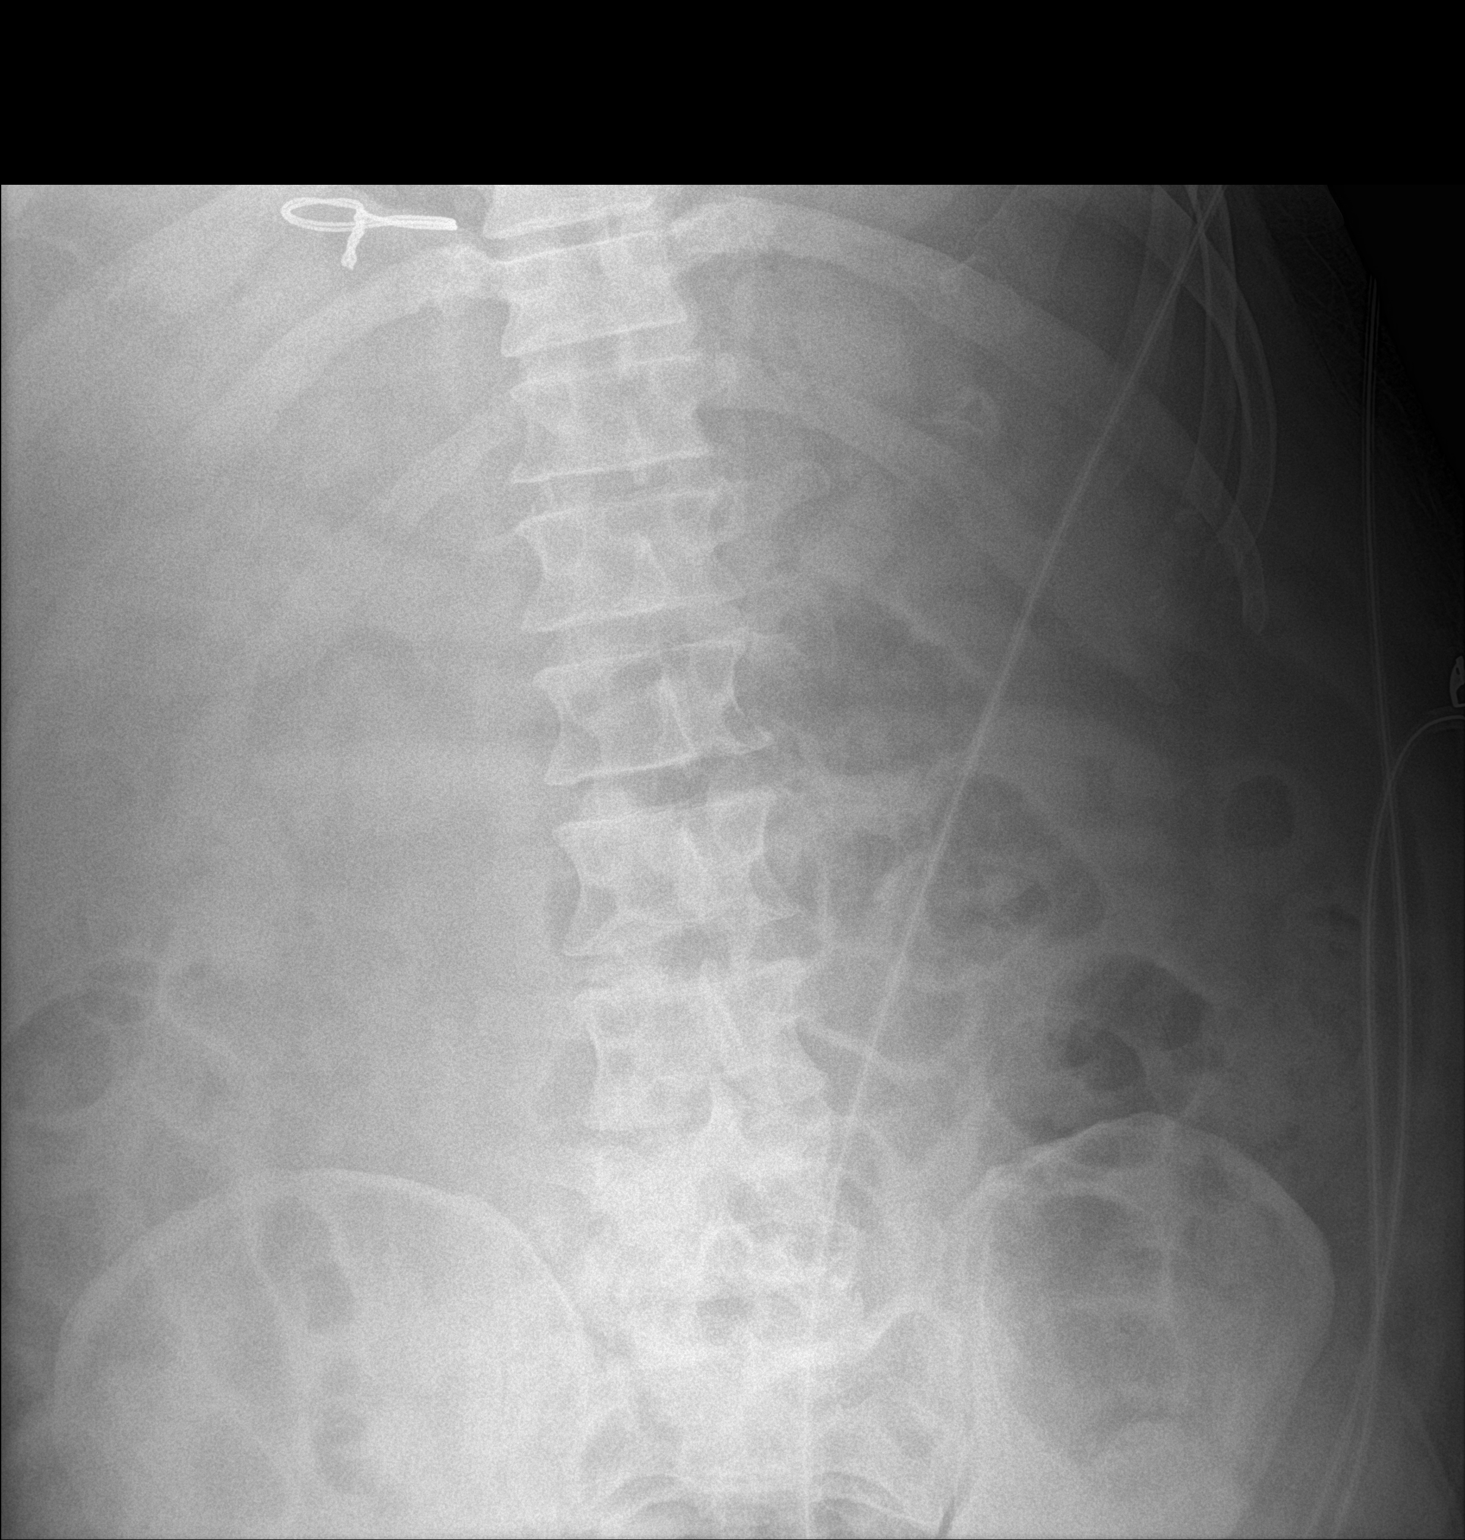
[im 2/2]
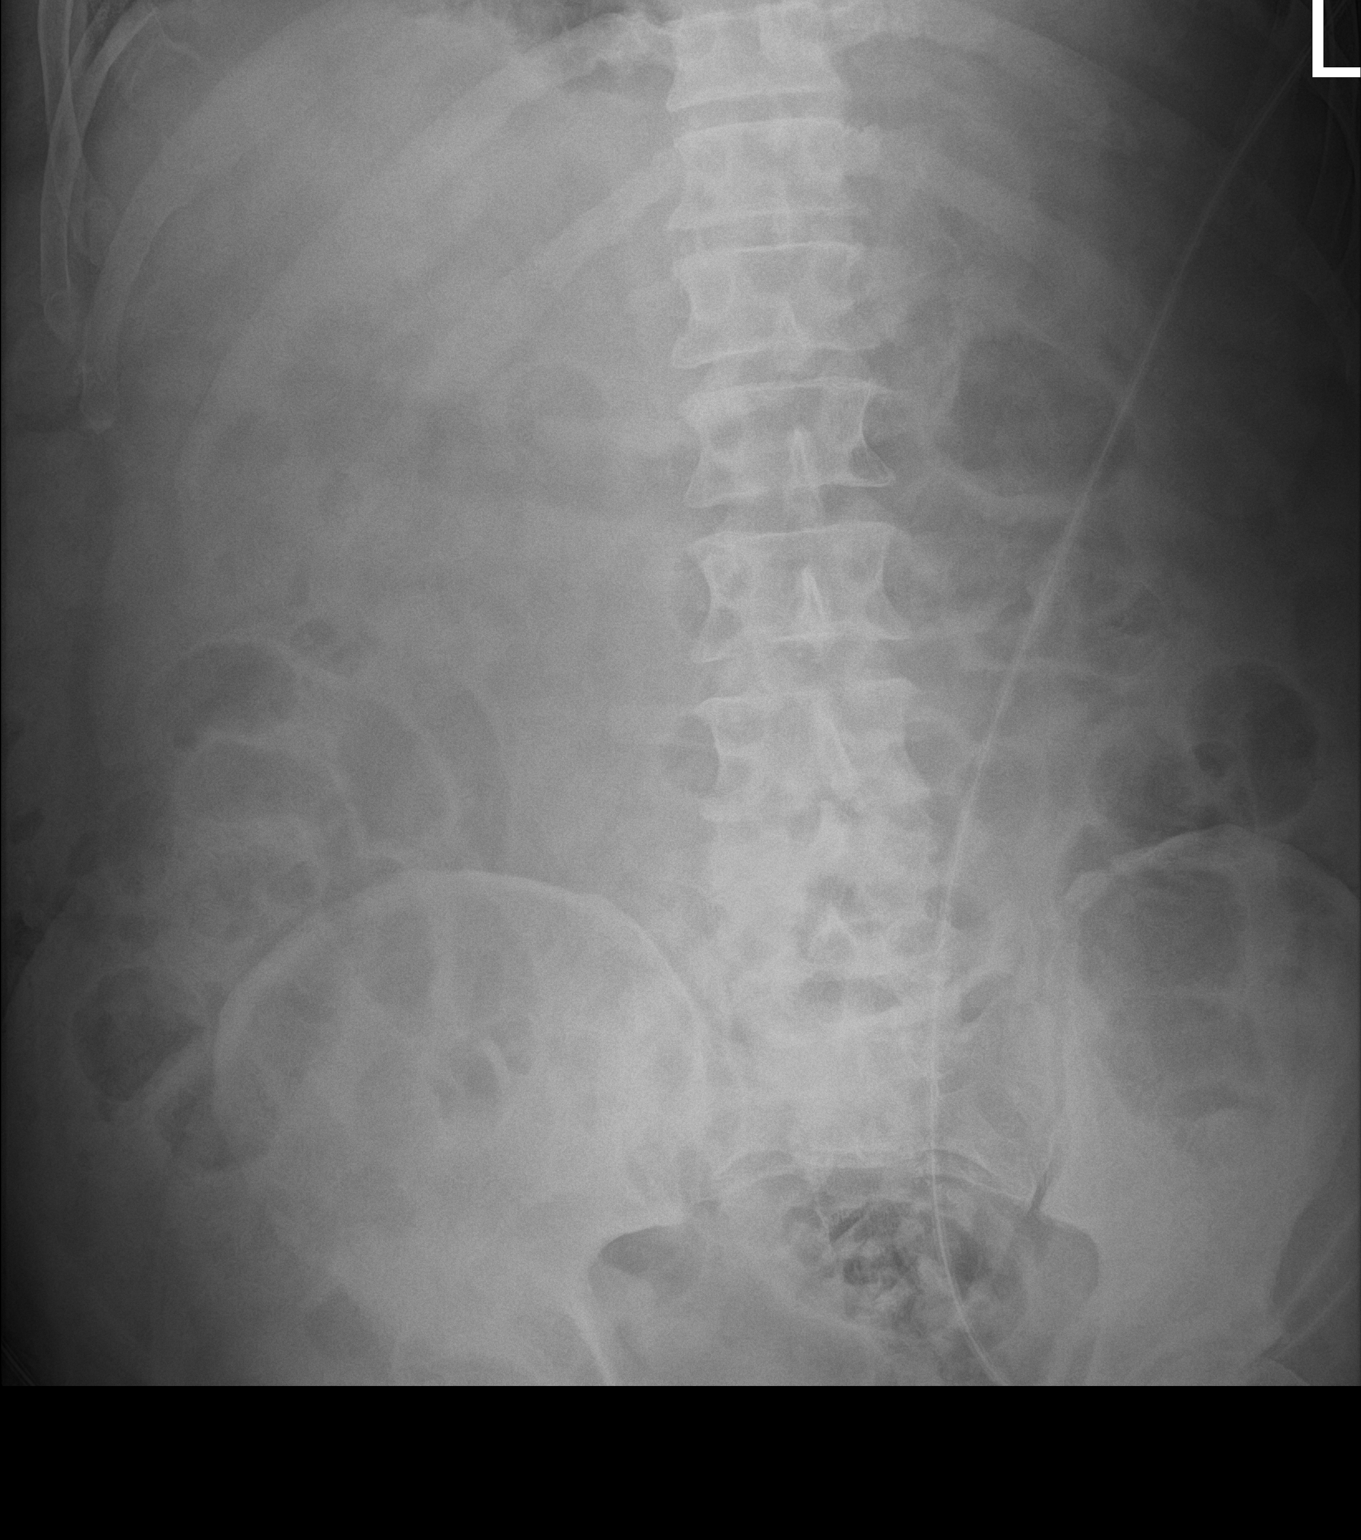

[2 of 2 positions shown; findings below may reference images not displayed]

FINDINGS: Scattered large and small bowel gas is noted. No obstructive changes
are seen. No free air is noted. No bony abnormality is seen.
IMPRESSION: No acute abnormality noted.

## 2023-11-11 ENCOUNTER — Other Ambulatory Visit: Payer: Self-pay | Admitting: Physician Assistant

## 2023-11-11 DIAGNOSIS — J449 Chronic obstructive pulmonary disease, unspecified: Secondary | ICD-10-CM

## 2023-11-16 ENCOUNTER — Other Ambulatory Visit: Payer: Self-pay | Admitting: Physician Assistant

## 2023-11-16 DIAGNOSIS — F419 Anxiety disorder, unspecified: Secondary | ICD-10-CM

## 2023-11-16 DIAGNOSIS — F5102 Adjustment insomnia: Secondary | ICD-10-CM

## 2023-11-30 ENCOUNTER — Other Ambulatory Visit: Payer: Self-pay

## 2023-11-30 DIAGNOSIS — E1165 Type 2 diabetes mellitus with hyperglycemia: Secondary | ICD-10-CM

## 2023-11-30 MED ORDER — GLUCOSE BLOOD VI STRP: ORAL_STRIP | 12 refills | Status: DC

## 2023-12-01 DIAGNOSIS — Z419 Encounter for procedure for purposes other than remedying health state, unspecified: Secondary | ICD-10-CM | POA: Diagnosis not present

## 2023-12-09 ENCOUNTER — Ambulatory Visit: Payer: Medicaid Other | Admitting: Physician Assistant

## 2023-12-09 ENCOUNTER — Encounter: Payer: Self-pay | Admitting: Physician Assistant

## 2023-12-09 VITALS — BP 118/78 | HR 92 | Temp 97.1°F | Ht 64.0 in | Wt 215.0 lb

## 2023-12-09 DIAGNOSIS — E1165 Type 2 diabetes mellitus with hyperglycemia: Secondary | ICD-10-CM

## 2023-12-09 DIAGNOSIS — E782 Mixed hyperlipidemia: Secondary | ICD-10-CM | POA: Diagnosis not present

## 2023-12-09 DIAGNOSIS — I5020 Unspecified systolic (congestive) heart failure: Secondary | ICD-10-CM

## 2023-12-09 DIAGNOSIS — F5102 Adjustment insomnia: Secondary | ICD-10-CM | POA: Diagnosis not present

## 2023-12-09 DIAGNOSIS — Z125 Encounter for screening for malignant neoplasm of prostate: Secondary | ICD-10-CM | POA: Diagnosis not present

## 2023-12-09 DIAGNOSIS — L308 Other specified dermatitis: Secondary | ICD-10-CM

## 2023-12-09 DIAGNOSIS — F419 Anxiety disorder, unspecified: Secondary | ICD-10-CM | POA: Diagnosis not present

## 2023-12-09 DIAGNOSIS — L309 Dermatitis, unspecified: Secondary | ICD-10-CM | POA: Insufficient documentation

## 2023-12-09 DIAGNOSIS — I10 Essential (primary) hypertension: Secondary | ICD-10-CM

## 2023-12-09 DIAGNOSIS — I4819 Other persistent atrial fibrillation: Secondary | ICD-10-CM

## 2023-12-09 MED ORDER — EMPAGLIFLOZIN 25 MG PO TABS: 25.0000 mg | ORAL_TABLET | Freq: Every day | ORAL | 1 refills | Status: DC

## 2023-12-09 MED ORDER — TRIAMCINOLONE ACETONIDE 0.1 % EX CREA: 1.0000 | TOPICAL_CREAM | Freq: Two times a day (BID) | CUTANEOUS | 0 refills | Status: AC

## 2023-12-09 MED ORDER — BLOOD GLUCOSE MONITORING SUPPL DEVI: 1.0000 | Freq: Three times a day (TID) | 0 refills | Status: AC

## 2023-12-09 NOTE — Progress Notes (Signed)
 Subjective:  Patient ID: Marcus Hicks, male    DOB: 1966-11-26  Age: 58 y.o. MRN: [id number]  Chief Complaint  Patient presents with   Medical Management of Chronic Issues    HPI  Pt with history of type 2 diabetes mellitus with diabetic polyneuropathy diagnosed in 2010- he is currently taking trulicity  4.5 mg weekly - he is taking jardiance  25 mg qd -  Requests rx for glucometer and 90 day supply of Jardiance  Last hgb A1c was 9.5 and meds were adjusted States that his glucose is ranging He is up to date on eye exam He is on statin treatment and is currently on ARB  Mixed hyperlipidemia  Pt presents with hyperlipidemia. Compliance with treatment has been fair - The patient is compliant with medications, maintains a low cholesterol diet , follows up as directed , and maintains an exercise regimen . The patient denies experiencing any hypercholesterolemia related symptoms. Currently taking crestor  40mg  qd  Pt currently follows cardiology for heart failure with reduced ejection fraction as well as CAD and atrial fibrillation - his current medications include losartan , metoprolol  , lasix  and potassium - He is currently not having any chest pain, pedal edema or dyspnea He recently had heart ablasion which he tolerated well --- amiodarone  has been discontinued He follows with Dr Monetta and Dr Inocencio  Pt with history of COPD - he uses Anoro ellipta  and albuterol  neb as needed - currently not having any breathing issues  Pt with history of anxiety - uses xanax  as needed  Pt with history of primary insomnia - takes ambien  10mg   Pt requests refill of triamcinolone  cream for eczema on lower legs - states jeans chaffe his calves in winter  Pt states he has a history of Hepatitis C and states he was treated about 20 years ago - was supposed to follow up at some point but never did - we did do a referral to GI but pt has not set up appt yet  Current Outpatient Medications on File Prior  to Visit  Medication Sig Dispense Refill   acetaminophen  (TYLENOL ) 500 MG tablet Take 500 mg by mouth every 6 (six) hours as needed for mild pain or fever.     albuterol  (PROVENTIL ) (2.5 MG/3ML) 0.083% nebulizer solution Take 3 mLs (2.5 mg total) by nebulization every 6 (six) hours as needed for wheezing.     ALPRAZolam  (XANAX ) 0.5 MG tablet TAKE 1 TABLET BY MOUTH EVERY DAY AS NEEDED 30 tablet 0   ANORO ELLIPTA  62.5-25 MCG/ACT AEPB INHALE 1 PUFF INTO THE LUNGS DAILY 60 each 0   Dulaglutide  (TRULICITY ) 4.5 MG/0.5ML SOPN Inject 4.5 mg as directed once a week. 6 mL 1   furosemide  (LASIX ) 20 MG tablet Take 20 mg by mouth daily.     glucose blood test strip Use as instructed 100 each 12   Insulin  Pen Needle (BD PEN NEEDLE NANO 2ND GEN) 32G X 4 MM MISC USE ONCE DAILY 100 each 3   losartan  (COZAAR ) 50 MG tablet TAKE 1 TABLET BY MOUTH EVERY DAY 30 tablet 2   metoprolol  tartrate (LOPRESSOR ) 25 MG tablet Take 1 tablet (25 mg total) by mouth 2 (two) times daily. 180 tablet 1   Microlet Lancets MISC 1 each by Does not apply route daily in the afternoon. 100 each 2   potassium chloride  SA (KLOR-CON  M) 20 MEQ tablet Take 1 tablet (20 mEq total) by mouth 2 (two) times daily. 180 tablet 3   rosuvastatin  (CRESTOR )  40 MG tablet TAKE 1 TABLET(40 MG) BY MOUTH DAILY 90 tablet 1   zolpidem  (AMBIEN ) 10 MG tablet TAKE 1 TABLET(10 MG) BY MOUTH AT BEDTIME AS NEEDED FOR SLEEP 30 tablet 0   No current facility-administered medications on file prior to visit.   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  Advised to get better sleep, walk on a regular basis, decrease weight down to about 210 and maintain, and eliminate tobacco.   Excessive dental attrition 09/22/2021   HFrEF (heart failure with reduced ejection fraction) (HCC) 08/22/2020   History of maze procedure 03/31/2022   Hyperactive gag reflex 09/22/2021   Hyperlipidemia 10/12/2016   Impacted third molar tooth 09/22/2021   Incipient enamel caries 09/22/2021   Long term (current) use of anticoagulants 09/22/2021   Long-term insulin  use (HCC) 10/12/2016   Morbid obesity (HCC) 10/12/2016   Patient advised to lose down to about 210 pounds and maintained.  Regular walking program recommended.  He knows how to lose weight.   Obesity, unspecified 10/12/2016   Patient advised to lose down to about 210 pounds and maintained.  Regular walking program recommended.  He knows how to lose weight.   Phobia of dental procedure 09/22/2021   Routine general medical examination at a health care facility 01/28/2021   S/P AVR (aortic valve replacement) and aortoplasty 10/28/2021   Sleep apnea    Teeth missing 09/22/2021   Thoracic aortic aneurysm without rupture, unspecified part (HCC) 03/31/2022   Type 2 diabetes mellitus with diabetic polyneuropathy (HCC) 10/12/2016   Past Surgical History:  Procedure Laterality Date   AORTIC ROOT ENLARGEMENT  10/28/2021    Procedure: AORTIC ROOT ENLARGEMENT WITH HEMASHIELD PLATINUM X 30CM;  Surgeon: Shyrl Linnie KIDD, MD;  Location: [facility] OR;  Service: Open Heart Surgery;;   AORTIC VALVE REPLACEMENT N/A 10/28/2021   Procedure: AORTIC VALVE REPLACEMENT (AVR) WITH INSPIRIS RESILIA AORTIC VALVE SIZE ;  Surgeon: Shyrl Linnie KIDD, MD;  Location: [facility] OR;  Service: Open Heart Surgery;  Laterality: N/A;   ATRIAL FIBRILLATION ABLATION N/A 02/18/2023   Procedure: ATRIAL FIBRILLATION ABLATION;  Surgeon: Inocencio Soyla Lunger, MD;  Location: [facility] INVASIVE CV LAB;  Service: Cardiovascular;  Laterality: N/A;   CARDIAC CATHETERIZATION     CARDIOVERSION N/A 09/24/2020   Procedure: CARDIOVERSION;  Surgeon: Kate Lonni CROME, MD;  Location: [facility] ENDOSCOPY;  Service: Cardiovascular;  Laterality: N/A;   CARDIOVERSION N/A 08/13/2021   Procedure: CARDIOVERSION;  Surgeon: Loni Soyla LABOR, MD;  Location: [facility] ENDOSCOPY;  Service:  Cardiovascular;  Laterality: N/A;   CARDIOVERSION N/A 02/26/2022   Procedure: CARDIOVERSION;  Surgeon: Sheena Pugh, DO;  Location: [facility] ENDOSCOPY;  Service: Cardiovascular;  Laterality: N/A;   CCTA  08/2019   CLIPPING OF ATRIAL APPENDAGE N/A 10/28/2021   Procedure: CLIPPING OF ATRIAL APPENDAGE WITH ATRICLIP ACHV45;  Surgeon: Shyrl Linnie KIDD, MD;  Location: [facility] OR;  Service: Open Heart Surgery;  Laterality: N/A;   MAZE N/A 10/28/2021   Procedure: MAZE;  Surgeon: Shyrl Linnie KIDD, MD;  Location: [facility] OR;  Service: Open Heart Surgery;  Laterality: N/A;   RIGHT/LEFT HEART CATH AND CORONARY ANGIOGRAPHY N/A 09/26/2021   Procedure: RIGHT/LEFT HEART CATH AND CORONARY ANGIOGRAPHY;  Surgeon: Jordan, Peter M, MD;  Location: [facility] INVASIVE CV LAB;  Service: Cardiovascular;  Laterality: N/A;   TEE WITHOUT CARDIOVERSION  09/24/2020   Procedure: TRANSESOPHAGEAL ECHOCARDIOGRAM (TEE);  Surgeon: Kate Lonni CROME, MD;  Location: [facility] ENDOSCOPY;  Service: Cardiovascular;;   TEE WITHOUT CARDIOVERSION N/A  10/28/2021   Procedure: TRANSESOPHAGEAL ECHOCARDIOGRAM (TEE);  Surgeon: Shyrl Linnie KIDD, MD;  Location: [facility] OR;  Service: Open Heart Surgery;  Laterality: N/A;    Family History  Problem Relation Age of Onset   Cancer Maternal Grandfather    Liver disease Sister    Alzheimer's disease Other    Memory loss Other    Hypertension Other    Social History   Socioeconomic History   Marital status: Widowed    Spouse name: Not on file   Number of children: Not on file   Years of education: Not on file   Highest education level: Not on file  Occupational History   Not on file  Tobacco Use   Smoking status: Former    Passive exposure: Past   Smokeless tobacco: Former    Types: Snuff   Tobacco comments:    Former snuff 08/11/2021  Vaping Use   Vaping status: Never Used  Substance and Sexual Activity   Alcohol use: Not Currently   Drug use: Never   Sexual activity: Never  Other Topics Concern   Not on file  Social History Narrative   Not on file   Social Drivers of Health   Financial Resource Strain: Low Risk  (08/09/2023)   Overall Financial Resource Strain (CARDIA)    Difficulty of Paying Living Expenses: Not hard at all  Food Insecurity: No Food Insecurity (08/09/2023)   Hunger Vital Sign    Worried About Running Out of Food in the Last Year: Never true    Ran Out of Food in the Last Year: Never true  Transportation Needs: No Transportation Needs (08/09/2023)   PRAPARE - Administrator, Civil Service (Medical): No    Lack of Transportation (Non-Medical): No  Physical Activity: Insufficiently Active (08/09/2023)   Exercise Vital Sign    Days of Exercise per Week: 4 days    Minutes of Exercise per Session: 30 min  Stress: No Stress Concern Present (08/09/2023)   [employer] of Occupational Health - Occupational Stress Questionnaire    Feeling of Stress : Not at all  Social Connections: Socially Isolated (08/09/2023)   Social Connection and Isolation Panel  [NHANES]    Frequency of Communication with Friends and Family: More than three times a week    Frequency of Social Gatherings with Friends and Family: Three times a week    Attends Religious Services: Never    Active Member of Clubs or Organizations: No    Attends Banker Meetings: Never  Marital Status: Widowed   CONSTITUTIONAL: Negative for chills, fatigue, fever, unintentional weight gain and unintentional weight loss.  E/N/T: Negative for ear pain, nasal congestion and sore throat.  CARDIOVASCULAR: Negative for chest pain, dizziness, palpitations and pedal edema.  RESPIRATORY: Negative for recent cough and dyspnea.  GASTROINTESTINAL: Negative for abdominal pain, acid reflux symptoms, constipation, diarrhea, nausea and vomiting.  MSK: Negative for arthralgias and myalgias.  INTEGUMENTARY: Negative for rash.  NEUROLOGICAL: Negative for dizziness and headaches.  PSYCHIATRIC: Negative for sleep disturbance and to question depression screen.  Negative for depression, negative for anhedonia.       Objective:  PHYSICAL EXAM:   VS: BP 118/78 (BP Location: Left Arm, Patient Position: Sitting)   Pulse 92   Temp (!) 97.1 F (36.2 C) (Temporal)   Ht 5' 4 (1.626 m)   Wt 215 lb (97.5 kg)   SpO2 96%   BMI 36.90 kg/m   GEN: Well nourished, well developed, in no acute distress  HEENT: normal external ears and nose - normal external auditory canals and TMS - hearing grossly normal - normal nasal mucosa and septum - Lips, Teeth and Gums - normal  Oropharynx - normal mucosa, palate, and posterior pharynx Neck: no JVD or masses - no thyromegaly Cardiac: RRR; no murmurs, rubs, or gallops,no edema -  Respiratory:  normal respiratory rate and pattern with no distress - normal breath sounds with no rales, rhonchi, wheezes or rubs MS: no deformity or atrophy  Skin: warm and dry, no rash  Neuro:  Alert and Oriented x 3, - CN II-Xii grossly intact Psych: euthymic mood, appropriate  affect and demeanor    Lab Results  Component Value Date   WBC 5.3 08/09/2023   HGB 16.7 08/09/2023   HCT 49.8 08/09/2023   PLT 172 08/09/2023   GLUCOSE 134 (H) 08/09/2023   CHOL 182 08/09/2023   TRIG 256 (H) 08/09/2023   HDL 36 (L) 08/09/2023   LDLCALC 102 (H) 08/09/2023   ALT 65 (H) 08/09/2023   AST 56 (H) 08/09/2023   NA 139 08/09/2023   K 4.6 08/09/2023   CL 97 08/09/2023   CREATININE 0.95 08/09/2023   BUN 17 08/09/2023   CO2 24 08/09/2023   TSH 0.908 08/09/2023   INR 3.3 (A) 03/24/2022   HGBA1C 9.5 (H) 08/09/2023   MICROALBUR 30 04/09/2021      Assessment & Plan:   Problem List Items Addressed This Visit       Cardiovascular and Mediastinum   Hypertension associated with diabetes (HCC)   Relevant Medications   aspirin  EC 81 MG tablet   Other Relevant Orders   CBC with Differential/Platelet   Comprehensive metabolic panel   TSH Continue current meds   HFrEF (heart failure with reduced ejection fraction) (HCC)   Relevant Medications   aspirin  EC 81 MG tablet Continue follow up with cardiology Continue meds   Dysrhythmia   Relevant Medications   aspirin  EC 81 MG tablet   Other Relevant Orders   TSH Continue meds Follow with cardiology as directed   Coronary artery disease   Relevant Medications   aspirin  EC 81 MG tablet Continue statin     Respiratory   COPD (chronic obstructive pulmonary disease) (HCC) Continue meds        Digestive   Cirrhosis of liver without ascites, unspecified hepatic cirrhosis type (HCC) Pt to call GI for appt     Endocrine   Type 2 diabetes mellitus with diabetic polyneuropathy (HCC) - Primary  Relevant Medications      aspirin  EC 81 MG tablet   Dulaglutide  (TRULICITY ) 4.5mg  weekly   Microlet Lancets MISC   Other Relevant Orders   CBC with Differential/Platelet   Comprehensive metabolic panel   Hemoglobin A1c        Other   Hyperlipidemia associated with diabetes (HCC)   Relevant Medications   aspirin   EC 81 MG tablet   Other Relevant Orders   Lipid panel   Other Visit Diagnoses     Adjustment insomnia       Rx for ambien          History of hep C Pt to call GI back for appt  Prostate screening PSA            .  Meds ordered this encounter  Medications   empagliflozin  (JARDIANCE ) 25 MG TABS tablet    Sig: Take 1 tablet (25 mg total) by mouth daily.    Dispense:  90 tablet    Refill:  1    ZERO refills remain on this prescription. Your patient is requesting advance approval of refills for this medication to PREVENT ANY MISSED DOSES    Supervising Provider:   COX, KIRSTEN [[id number]]   triamcinolone  cream (KENALOG ) 0.1 %    Sig: Apply 1 Application topically 2 (two) times daily.    Dispense:  454 g    Refill:  0    Supervising Provider:   SHERRE CLAPPER [phone]    Orders Placed This Encounter  Procedures   CBC with Differential/Platelet   Comprehensive metabolic panel   Lipid panel   Hemoglobin A1c   TSH   PSA   Microalbumin / creatinine urine ratio   POCT Glucose (Device for Home Use)     Follow-up: Return in about 4 months (around 04/07/2024) for chronic fasting follow-up.  An After Visit Summary was printed and given to the patient.  CAMIE JONELLE NICHOLAUS DEVONNA [facility] [phone]

## 2023-12-10 LAB — COMPREHENSIVE METABOLIC PANEL
ALT: 51 [IU]/L — ABNORMAL HIGH (ref 0–44)
AST: 33 [IU]/L (ref 0–40)
Albumin: 5.1 g/dL — ABNORMAL HIGH (ref 3.8–4.9)
Alkaline Phosphatase: 66 [IU]/L (ref 44–121)
BUN/Creatinine Ratio: 16 (ref 9–20)
BUN: 16 mg/dL (ref 6–24)
Bilirubin Total: 0.6 mg/dL (ref 0.0–1.2)
CO2: 25 mmol/L (ref 20–29)
Calcium: 9.7 mg/dL (ref 8.7–10.2)
Chloride: 95 mmol/L — ABNORMAL LOW (ref 96–106)
Creatinine, Ser: 0.98 mg/dL (ref 0.76–1.27)
Globulin, Total: 2.5 g/dL (ref 1.5–4.5)
Glucose: 171 mg/dL — ABNORMAL HIGH (ref 70–99)
Potassium: 4.5 mmol/L (ref 3.5–5.2)
Sodium: 138 mmol/L (ref 134–144)
Total Protein: 7.6 g/dL (ref 6.0–8.5)
eGFR: 89 mL/min/{1.73_m2} (ref >59)

## 2023-12-10 LAB — CBC WITH DIFFERENTIAL/PLATELET
Basophils Absolute: 0 10*3/uL (ref 0.0–0.2)
Basos: 1 %
EOS (ABSOLUTE): 0.1 10*3/uL (ref 0.0–0.4)
Eos: 2 %
Hematocrit: 51.1 % — ABNORMAL HIGH (ref 37.5–51.0)
Hemoglobin: 17.1 g/dL (ref 13.0–17.7)
Immature Grans (Abs): 0 10*3/uL (ref 0.0–0.1)
Immature Granulocytes: 0 %
Lymphocytes Absolute: 1 10*3/uL (ref 0.7–3.1)
Lymphs: 18 %
MCH: 29.8 pg (ref 26.6–33.0)
MCHC: 33.5 g/dL (ref 31.5–35.7)
MCV: 89 fL (ref 79–97)
Monocytes Absolute: 0.4 10*3/uL (ref 0.1–0.9)
Monocytes: 8 %
Neutrophils Absolute: 3.9 10*3/uL (ref 1.4–7.0)
Neutrophils: 71 %
Platelets: 180 10*3/uL (ref 150–450)
RBC: 5.73 x10E6/uL (ref 4.14–5.80)
RDW: 12.2 % (ref 11.6–15.4)
WBC: 5.5 10*3/uL (ref 3.4–10.8)

## 2023-12-10 LAB — MICROALBUMIN / CREATININE URINE RATIO
Creatinine, Urine: 36.8 mg/dL
Microalb/Creat Ratio: 191 mg/g{creat} — ABNORMAL HIGH (ref 0–29)
Microalbumin, Urine: 70.3 ug/mL

## 2023-12-10 LAB — LIPID PANEL
Chol/HDL Ratio: 3.9 {ratio} (ref 0.0–5.0)
Cholesterol, Total: 154 mg/dL (ref 100–199)
HDL: 39 mg/dL — ABNORMAL LOW (ref >39)
LDL Chol Calc (NIH): 84 mg/dL (ref 0–99)
Triglycerides: 181 mg/dL — ABNORMAL HIGH (ref 0–149)
VLDL Cholesterol Cal: 31 mg/dL (ref 5–40)

## 2023-12-10 LAB — HEMOGLOBIN A1C
Est. average glucose Bld gHb Est-mCnc: 203 mg/dL
Hgb A1c MFr Bld: 8.7 % — ABNORMAL HIGH (ref 4.8–5.6)

## 2023-12-10 LAB — TSH: TSH: 1.15 u[IU]/mL (ref 0.450–4.500)

## 2023-12-10 LAB — PSA: Prostate Specific Ag, Serum: 1 ng/mL (ref 0.0–4.0)

## 2023-12-13 ENCOUNTER — Other Ambulatory Visit: Payer: Self-pay | Admitting: Physician Assistant

## 2023-12-13 DIAGNOSIS — E1165 Type 2 diabetes mellitus with hyperglycemia: Secondary | ICD-10-CM

## 2023-12-13 MED ORDER — PIOGLITAZONE HCL 15 MG PO TABS: 15.0000 mg | ORAL_TABLET | Freq: Every day | ORAL | 3 refills | Status: DC

## 2023-12-14 ENCOUNTER — Other Ambulatory Visit: Payer: Self-pay | Admitting: Physician Assistant

## 2023-12-14 DIAGNOSIS — F419 Anxiety disorder, unspecified: Secondary | ICD-10-CM

## 2023-12-28 ENCOUNTER — Other Ambulatory Visit: Payer: Self-pay | Admitting: Family Medicine

## 2023-12-30 ENCOUNTER — Telehealth: Payer: Self-pay

## 2023-12-30 NOTE — Telephone Encounter (Signed)
Patient has been approved for Trulicity 4.5mg /0.16mL auto-injectors from 12/29/2023 through 12/28/2024.

## 2024-01-01 DIAGNOSIS — Z419 Encounter for procedure for purposes other than remedying health state, unspecified: Secondary | ICD-10-CM | POA: Diagnosis not present

## 2024-01-29 DIAGNOSIS — Z419 Encounter for procedure for purposes other than remedying health state, unspecified: Secondary | ICD-10-CM | POA: Diagnosis not present

## 2024-02-01 ENCOUNTER — Other Ambulatory Visit: Payer: Self-pay | Admitting: Physician Assistant

## 2024-02-01 ENCOUNTER — Other Ambulatory Visit: Payer: Self-pay | Admitting: Cardiology

## 2024-02-01 DIAGNOSIS — I10 Essential (primary) hypertension: Secondary | ICD-10-CM

## 2024-02-01 DIAGNOSIS — E785 Hyperlipidemia, unspecified: Secondary | ICD-10-CM

## 2024-02-01 NOTE — Telephone Encounter (Signed)
 Prescription sent to pharmacy.

## 2024-02-07 ENCOUNTER — Encounter: Payer: Self-pay | Admitting: Physician Assistant

## 2024-03-08 ENCOUNTER — Other Ambulatory Visit: Payer: Self-pay | Admitting: Physician Assistant

## 2024-03-11 ENCOUNTER — Other Ambulatory Visit: Payer: Self-pay | Admitting: Family Medicine

## 2024-03-11 ENCOUNTER — Other Ambulatory Visit: Payer: Self-pay | Admitting: Physician Assistant

## 2024-03-11 DIAGNOSIS — E1165 Type 2 diabetes mellitus with hyperglycemia: Secondary | ICD-10-CM

## 2024-03-11 DIAGNOSIS — Z419 Encounter for procedure for purposes other than remedying health state, unspecified: Secondary | ICD-10-CM | POA: Diagnosis not present

## 2024-03-23 NOTE — Progress Notes (Unsigned)
 03/24/2024 Marcus Hicks [id number] [birth year]  Referring provider: Cyndi Drain, PA-C Primary GI doctor: Dr. Karene Oto  ASSESSMENT AND PLAN:   Compensated Cirrhosis with history of hepatitis C treated at [facility] 2016  liver Doppler 2019 [facility] nodular liver with hepatosplenomegaly likely secondary to hepatitis C with MASH 12/09/2023 WBC 5.5 HGB 17.1 Platelets 180 12/09/2023 AST 33 ALT 51 Alkphos 66 TBili 0.6 03/24/2022 INR 3.3 No recent INR to calculate MELD, no recent imaging, no history of thrombocytopenia Check INR to calculate MELD  Serologic workup: None, HCV RNA quant negative 2022, check today Ascites:  euvolemic on exam Varices screening / surveillance EGD:     no screening  - plan on EGD with screening colonoscopy Hepatic encephalopathy:  No evidence on exam Most recent HCC screening:    Will get updated HCC screening Last AFP 2019 4.4   -Nutrition and low sodium diet discussed with patient and information given -Continue daily multivitamin -Recommended 30 minutes of aerobic and resistance exercise 3 days/week - information given to the patient - follow up 6 months  History of colon screening Older sister with colostomy but unknown cause Age 50 at [place], states no polyps.  Will get records Will text sister, if colon cancer will schedule sooner, if not will plan repeat 10 year interval next year  History of aortic stenosis status post AVR  Atrial fibrillation Status post ligation of left atrial appendage and maze procedure 01/2023 Not anticoagulated  HF reduced EF/mild CAD Improved with medical therapy 12/2021 EF 50 to 55% Cardiac score 02/11/2023  Type 2 diabetes with polyneuropathy On Actos  and Trulicity   COPD/OSA overlap Not on oxygen  Patient Care Team: Cyndi Drain, Kirby Peoples as PCP - General (Physician Assistant) Lei Pump, MD as PCP - Electrophysiology (Cardiology) Hassan Links, MD as PCP - Cardiology (Cardiology)  HISTORY OF  PRESENT ILLNESS: 58 y.o. male with a past medical history listed below presents for evaluation of cirrhosis and screening colon.   Discussed the use of AI scribe software for clinical note transcription with the patient, who gave verbal consent to proceed.  History of Present Illness   Marcus Hicks is a 58 year old male with cirrhosis and atrial fibrillation who presents for follow-up on his liver condition and overall health management.  He has a history of hepatitis C, treated and eradicated in 2012, and has been diagnosed with cirrhosis. He has not been followed for his liver condition since 2018 due to his wife's illness, leading to a lapse in his own medical care. He is concerned about his liver health and is seeking to re-establish care. No current symptoms of ascites or jaundice. No history of esophageal varices or hepatic encephalopathy. No yellowing of eyes or skin, significant swelling in legs, dark or black stools, or issues with swallowing.  He has a history of atrial fibrillation, diagnosed after a severe episode where he felt he was having a stroke and was found to be in AFib. He underwent heart surgery in March 2022 and has been working on regaining his strength. It took him about a year and a half to two years to feel stronger post-surgery. He is not currently on anticoagulation therapy and reports doing better but still experiences easy fatigue with exertion. No current chest pain or significant shortness of breath at rest, but experiences exertional dyspnea.  He denies any current alcohol use, smoking, or use of NSAIDs like Aleve or ibuprofen. He quit smoking in 2007. He experiences some  circulation issues, noting that his toes and hands can turn purple, which he attributes to a circulation problem rather than fluid retention. He mentions dietary adjustments, such as using no salt and consuming certain foods like broccoli and green beans to aid digestion. He notes changes in his  bowel habits post-heart surgery, with stools sometimes being green and not as firm as before, which he attributes to dietary factors.  He has a family history of a sister who had a colostomy bag, but he is unsure if it was due to colon cancer or another condition. He is due for a colonoscopy next year at age 58, as his last colonoscopy was clean and performed ten years ago.      He  reports that he has quit smoking. He has been exposed to tobacco smoke. He has quit using smokeless tobacco.  His smokeless tobacco use included snuff. He reports that he does not currently use alcohol. He reports that he does not use drugs.  RELEVANT GI HISTORY, IMAGING AND LABS: Results   RADIOLOGY Liver Doppler Ultrasound: Nodular liver, splenomegaly, hepatomegaly (2019)  DIAGNOSTIC Colonoscopy: Clean, no abnormalities      CBC    Component Value Date/Time   WBC 5.5 12/09/2023 1517   WBC 7.1 11/03/2021 0433   RBC 5.73 12/09/2023 1517   RBC 3.49 (L) 11/03/2021 0433   HGB 17.1 12/09/2023 1517   HCT 51.1 (H) 12/09/2023 1517   PLT 180 12/09/2023 1517   MCV 89 12/09/2023 1517   MCH 29.8 12/09/2023 1517   MCH 29.2 11/03/2021 0433   MCHC 33.5 12/09/2023 1517   MCHC 31.8 11/03/2021 0433   RDW 12.2 12/09/2023 1517   LYMPHSABS 1.0 12/09/2023 1517   MONOABS 0.9 06/01/2021 0124   EOSABS 0.1 12/09/2023 1517   BASOSABS 0.0 12/09/2023 1517   Recent Labs    04/08/23 1408 08/09/23 1438 12/09/23 1517  HGB 16.7 16.7 17.1    CMP     Component Value Date/Time   NA 138 12/09/2023 1517   K 4.5 12/09/2023 1517   CL 95 (L) 12/09/2023 1517   CO2 25 12/09/2023 1517   GLUCOSE 171 (H) 12/09/2023 1517   GLUCOSE 116 (H) 11/03/2021 0433   BUN 16 12/09/2023 1517   CREATININE 0.98 12/09/2023 1517   CALCIUM  9.7 12/09/2023 1517   PROT 7.6 12/09/2023 1517   ALBUMIN  5.1 (H) 12/09/2023 1517   AST 33 12/09/2023 1517   ALT 51 (H) 12/09/2023 1517   ALKPHOS 66 12/09/2023 1517   BILITOT 0.6 12/09/2023 1517    GFRNONAA >60 11/03/2021 0433   GFRAA 110 01/23/2021 1334      Latest Ref Rng & Units 12/09/2023    3:17 PM 08/09/2023    2:38 PM 04/08/2023    2:08 PM  Hepatic Function  Total Protein 6.0 - 8.5 g/dL 7.6  8.1  7.6   Albumin  3.8 - 4.9 g/dL 5.1  5.2  5.1   AST 0 - 40 IU/L 33  56  39   ALT 0 - 44 IU/L 51  65  52   Alk Phosphatase 44 - 121 IU/L 66  63  67   Total Bilirubin 0.0 - 1.2 mg/dL 0.6  0.6  0.6       Current Medications:   Current Outpatient Medications (Endocrine & Metabolic):    empagliflozin  (JARDIANCE ) 25 MG TABS tablet, TAKE 1 TABLET(25 MG) BY MOUTH DAILY   TRULICITY  4.5 MG/0.5ML SOAJ, ADMINISTER 4.5 MG UNDER THE SKIN 1 TIME A  WEEK AS DIRECTED  Current Outpatient Medications (Cardiovascular):    furosemide  (LASIX ) 20 MG tablet, TAKE 1 TABLET(20 MG) BY MOUTH DAILY   losartan  (COZAAR ) 50 MG tablet, TAKE 1 TABLET BY MOUTH EVERY DAY   metoprolol  tartrate (LOPRESSOR ) 25 MG tablet, TAKE 1 TABLET(25 MG) BY MOUTH TWICE DAILY   rosuvastatin  (CRESTOR ) 40 MG tablet, TAKE 1 TABLET(40 MG) BY MOUTH DAILY  Current Outpatient Medications (Respiratory):    albuterol  (PROVENTIL ) (2.5 MG/3ML) 0.083% nebulizer solution, Take 3 mLs (2.5 mg total) by nebulization every 6 (six) hours as needed for wheezing.   ANORO ELLIPTA  62.5-25 MCG/ACT AEPB, INHALE 1 PUFF INTO THE LUNGS DAILY  Current Outpatient Medications (Analgesics):    acetaminophen  (TYLENOL ) 500 MG tablet, Take 500 mg by mouth every 6 (six) hours as needed for mild pain or fever.   Current Outpatient Medications (Other):    ALPRAZolam  (XANAX ) 0.5 MG tablet, TAKE 1 TABLET BY MOUTH EVERY DAY AS NEEDED   Blood Glucose Monitoring Suppl DEVI, 1 each by Does not apply route in the morning, at noon, and at bedtime. May substitute to any manufacturer covered by patient's insurance.   Insulin  Pen Needle (BD PEN NEEDLE NANO 2ND GEN) 32G X 4 MM MISC, USE ONCE DAILY   Microlet Lancets MISC, 1 each by Does not apply route daily in the afternoon.    potassium chloride  SA (KLOR-CON  M) 20 MEQ tablet, TAKE 1 TABLET(20 MEQ) BY MOUTH TWICE DAILY   triamcinolone  cream (KENALOG ) 0.1 %, Apply 1 Application topically 2 (two) times daily. (Patient taking differently: Apply 1 Application topically 2 (two) times daily. As needed)   zolpidem  (AMBIEN ) 10 MG tablet, TAKE 1 TABLET(10 MG) BY MOUTH AT BEDTIME AS NEEDED FOR SLEEP  Medical History:  Past Medical History:  Diagnosis Date   Accretions on teeth 09/22/2021   Acquired thrombophilia (HCC) 08/11/2021   Acute kidney injury (HCC) 06/01/2021   Atrial fibrillation with rapid ventricular response (HCC) 08/12/2020   Bicuspid aortic valve    BMI 38.0-38.9,adult 11/26/2020   Bruxism (teeth grinding) 09/22/2021   Caries 09/22/2021   CHF (congestive heart failure) (HCC)    Chronic hepatitis C virus infection (HCC) 08/03/2013   Chronic periodontitis 09/22/2021   Cirrhosis of liver without ascites, unspecified hepatic cirrhosis type (HCC) 12/25/2021   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  Advised to get better sleep, walk on a regular basis, decrease weight down to about 210 and maintain, and eliminate tobacco.   Excessive dental attrition 09/22/2021   HFrEF (heart failure with reduced ejection fraction) (HCC) 08/22/2020   History of maze procedure 03/31/2022   Hyperactive gag reflex 09/22/2021   Hyperlipidemia 10/12/2016   Impacted third molar tooth 09/22/2021   Incipient enamel caries 09/22/2021   Long term (current) use of anticoagulants 09/22/2021    Long-term insulin  use (HCC) 10/12/2016   Morbid obesity (HCC) 10/12/2016   Patient advised to lose down to about 210 pounds and maintained.  Regular walking program recommended.  He knows how to lose weight.   Obesity, unspecified 10/12/2016  Patient advised to lose down to about 210 pounds and maintained.  Regular walking program recommended.  He knows how to lose weight.   Phobia of dental procedure 09/22/2021   Routine general medical examination at a health care facility 01/28/2021   S/P AVR (aortic valve replacement) and aortoplasty 10/28/2021   Sleep apnea    Teeth missing 09/22/2021   Thoracic aortic aneurysm without rupture, unspecified part (HCC) 03/31/2022   Type 2 diabetes mellitus with diabetic polyneuropathy (HCC) 10/12/2016   Allergies:  Allergies  Allergen Reactions   Amiodarone  Other (See Comments)    Turned purple   Glucophage  [Metformin ] Other (See Comments)    Not tolerate - liver issues   Pioglitazone  Other (See Comments)    Toes turning purple     Surgical History:  He  has a past surgical history that includes CCTA (08/2019); Cardioversion (N/A, 09/24/2020); TEE without cardioversion (09/24/2020); Cardioversion (N/A, 08/13/2021); RIGHT/LEFT HEART CATH AND CORONARY ANGIOGRAPHY (N/A, 09/26/2021); Cardiac catheterization; Aortic valve replacement (N/A, 10/28/2021); MAZE (N/A, 10/28/2021); Clipping of atrial appendage (N/A, 10/28/2021); TEE without cardioversion (N/A, 10/28/2021); Aortic root enlargement (10/28/2021); Cardioversion (N/A, 02/26/2022); and ATRIAL FIBRILLATION ABLATION (N/A, 02/18/2023). Family History:  His family history includes Alzheimer's disease in his maternal grandmother; Hypertension in his maternal grandmother and sister; Liver disease in his sister; Lung cancer in his maternal grandfather; Other in his father and mother.  REVIEW OF SYSTEMS  : All other systems reviewed and negative except where noted in the History of Present Illness.  PHYSICAL  EXAM: BP 130/78   Pulse 91   Ht 5\' 4"  (1.626 m)   Wt 209 lb 6 oz (95 kg)   BMI 35.94 kg/m  Physical Exam   GENERAL APPEARANCE: Obese, in no apparent distress. HEENT: No cervical lymphadenopathy, unremarkable thyroid , sclerae anicteric, conjunctiva pink. RESPIRATORY: Respiratory effort normal, breath sounds clear to auscultation bilaterally without rales, rhonchi, or wheezing. CARDIO: Regular rate and rhythm with no murmurs, rubs, or gallops, peripheral pulses intact. ABDOMEN: Soft, non-distended, active bowel sounds in all four quadrants, non-tender, no rebound, no masses, no ascites, no hepatomegaly. RECTAL: Declines. MUSCULOSKELETAL: Full range of motion, normal gait, without edema. SKIN: Dry, intact without rashes or lesions. No jaundice. NEURO: Alert, oriented, no focal deficits. PSYCH: Cooperative, normal mood and affect. EXTREMITIES: No asterixis, no edema in legs.      Edmonia Gottron, PA-C 3:37 PM

## 2024-03-24 ENCOUNTER — Ambulatory Visit: Admitting: Physician Assistant

## 2024-03-24 ENCOUNTER — Encounter: Payer: Self-pay | Admitting: Physician Assistant

## 2024-03-24 ENCOUNTER — Other Ambulatory Visit

## 2024-03-24 VITALS — BP 130/78 | HR 91 | Ht 64.0 in | Wt 209.4 lb

## 2024-03-24 DIAGNOSIS — Z7984 Long term (current) use of oral hypoglycemic drugs: Secondary | ICD-10-CM | POA: Diagnosis not present

## 2024-03-24 DIAGNOSIS — Z7985 Long-term (current) use of injectable non-insulin antidiabetic drugs: Secondary | ICD-10-CM | POA: Diagnosis not present

## 2024-03-24 DIAGNOSIS — K7460 Unspecified cirrhosis of liver: Secondary | ICD-10-CM

## 2024-03-24 DIAGNOSIS — E1142 Type 2 diabetes mellitus with diabetic polyneuropathy: Secondary | ICD-10-CM | POA: Diagnosis not present

## 2024-03-24 DIAGNOSIS — J449 Chronic obstructive pulmonary disease, unspecified: Secondary | ICD-10-CM

## 2024-03-24 DIAGNOSIS — I5020 Unspecified systolic (congestive) heart failure: Secondary | ICD-10-CM

## 2024-03-24 DIAGNOSIS — I4891 Unspecified atrial fibrillation: Secondary | ICD-10-CM

## 2024-03-24 LAB — CBC WITH DIFFERENTIAL/PLATELET
Basophils Absolute: 0 10*3/uL (ref 0.0–0.1)
Basophils Relative: 0.8 % (ref 0.0–3.0)
Eosinophils Absolute: 0.1 10*3/uL (ref 0.0–0.7)
Eosinophils Relative: 1.1 % (ref 0.0–5.0)
HCT: 50.3 % (ref 39.0–52.0)
Hemoglobin: 17 g/dL (ref 13.0–17.0)
Lymphocytes Relative: 18.3 % (ref 12.0–46.0)
Lymphs Abs: 1.1 10*3/uL (ref 0.7–4.0)
MCHC: 33.8 g/dL (ref 30.0–36.0)
MCV: 89.5 fl (ref 78.0–100.0)
Monocytes Absolute: 0.4 10*3/uL (ref 0.1–1.0)
Monocytes Relative: 6 % (ref 3.0–12.0)
Neutro Abs: 4.6 10*3/uL (ref 1.4–7.7)
Neutrophils Relative %: 73.8 % (ref 43.0–77.0)
Platelets: 174 10*3/uL (ref 150.0–400.0)
RBC: 5.62 Mil/uL (ref 4.22–5.81)
RDW: 12.4 % (ref 11.5–15.5)
WBC: 6.2 10*3/uL (ref 4.0–10.5)

## 2024-03-24 LAB — COMPREHENSIVE METABOLIC PANEL WITH GFR
ALT: 43 U/L (ref 0–53)
AST: 34 U/L (ref 0–37)
Albumin: 5.4 g/dL — ABNORMAL HIGH (ref 3.5–5.2)
Alkaline Phosphatase: 50 U/L (ref 39–117)
BUN: 16 mg/dL (ref 6–23)
CO2: 32 meq/L (ref 19–32)
Calcium: 10.2 mg/dL (ref 8.4–10.5)
Chloride: 96 meq/L (ref 96–112)
Creatinine, Ser: 1.02 mg/dL (ref 0.40–1.50)
GFR: 80.6 mL/min (ref >60.00)
Glucose, Bld: 146 mg/dL — ABNORMAL HIGH (ref 70–99)
Potassium: 4.4 meq/L (ref 3.5–5.1)
Sodium: 137 meq/L (ref 135–145)
Total Bilirubin: 0.8 mg/dL (ref 0.2–1.2)
Total Protein: 8.5 g/dL — ABNORMAL HIGH (ref 6.0–8.3)

## 2024-03-24 LAB — PROTIME-INR
INR: 1.1 ratio — ABNORMAL HIGH (ref 0.8–1.0)
Prothrombin Time: 11.2 s (ref 9.6–13.1)

## 2024-03-24 LAB — IBC + FERRITIN
Ferritin: 210.8 ng/mL (ref 22.0–322.0)
Iron: 119 ug/dL (ref 42–165)
Saturation Ratios: 31.6 % (ref 20.0–50.0)
TIBC: 376.6 ug/dL (ref 250.0–450.0)
Transferrin: 269 mg/dL (ref 212.0–360.0)

## 2024-03-24 NOTE — Patient Instructions (Addendum)
 Your provider has requested that you go to the basement level for lab work before leaving today. Press "B" on the elevator. The lab is located at the first door on the left as you exit the elevator.  Due to recent changes in healthcare laws, you may see the results of your imaging and laboratory studies on MyChart before your provider has had a chance to review them.  We understand that in some cases there may be results that are confusing or concerning to you. Not all laboratory results come back in the same time frame and the provider may be waiting for multiple results in order to interpret others.  Please give us  48 hours in order for your provider to thoroughly review all the results before contacting the office for clarification of your results.   You have been scheduled for an abdominal ultrasound at [facility] (1st floor of hospital) on Tuesday, 03/28/24 at 8:00am. Please arrive 15 minutes prior to your appointment for registration. Make certain not to have anything to eat or drink after midnight prior to your appointment. Should you need to reschedule your appointment, please contact radiology at [phone]. This test typically takes about 30 minutes to perform.   You should have an imaging study every 6 months to monitor for the development of hepatocellular carcinoma (liver cancer). The risk is low, but, if liver cancer is diagnosed early, there are better treatment options. Will get AFP, INR, CBC, and CMET.  Will follow up in 6 months to check labs and evaluate.   I recommend a high-protein, primarily plant-based diet. Avoid red meat. Work to maintain a health weight. Weigh yourself daily- call if you have weight gain of greater than 5 lbs in a 1-2 days, leg swelling, or new swelling in your abdomen. Minimize salt intake- VERY important. Please do not consume more than 2000 mg of sodium every day. Monitor your blood pressure at home.  Stay active. Weight-based exercise for 30  minutes at least 3 days a week is recommended. I recommend that you not drink any alcohol including beer, wine, liquor, and non-alcoholic beer.   You are at increased risk of osteopenia and osteoporosis. You should be screened for these metabolic bone diseases if you have not already had the testing performed.  Thank you for trusting me with your gastrointestinal care!   Santina Cull, PA-C  _______________________________________________________  If your blood pressure at your visit was 140/90 or greater, please contact your primary care physician to follow up on this.  _______________________________________________________  If you are age 79 or older, your body mass index should be between 23-30. Your Body mass index is 35.94 kg/m. If this is out of the aforementioned range listed, please consider follow up with your Primary Care Provider.  If you are age 86 or younger, your body mass index should be between 19-25. Your Body mass index is 35.94 kg/m. If this is out of the aformentioned range listed, please consider follow up with your Primary Care Provider.   ________________________________________________________  The [facility] providers would like to encourage you to use MYCHART to communicate with providers for non-urgent requests or questions.  Due to long hold times on the telephone, sending your provider a message by [facility] may be a faster and more efficient way to get a response.  Please allow 48 business hours for a response.  Please remember that this is for non-urgent requests.  _______________________________________________________    Cirrhosis Cirrhosis is long-term (chronic) liver injury. The  liver is the body's largest internal organ, and it performs many functions. It converts food into energy, removes toxic material from the blood, makes important proteins, and absorbs necessary vitamins from food. In cirrhosis, healthy liver cells are replaced by scar tissue.  This prevents blood from flowing through the liver and makes it difficult for the liver to complete its functions. What are the causes? Common causes of this condition are hepatitis C and long-term alcohol abuse. Other causes include: Nonalcoholic fatty liver disease (NAFLD). This happens when fat is deposited in the liver by causes other than alcohol. Hepatitis B infection. Autoimmune hepatitis. In this condition, the body's defense system (immune system) mistakenly attacks the liver cells, causing inflammation. Diseases that cause blockage of ducts inside the liver. Inherited liver diseases, such as hemochromatosis. This is one of the most common inherited liver diseases. In this disease, deposits of iron collect in the liver and other organs. Reactions to certain long-term medicines, such as amiodarone , a heart medicine. Parasitic infections. These include schistosomiasis, which is caused by a flatworm. Long-term contact to certain toxins. These toxins include certain organic solvents, such as toluene and chloroform. What increases the risk? You are more likely to develop this condition if: You have certain types of viral hepatitis. You abuse alcohol, especially if you are male. You are overweight. You use IV drugs and share needles. You have unprotected sex with someone who has viral hepatitis. What are the signs or symptoms? You may not have any signs and symptoms at first. Symptoms may not develop until the damage to your liver starts to get worse. Early symptoms may include: Weakness and tiredness (fatigue). Changes in sleep patterns or having trouble sleeping. Itchiness. Tenderness in the right-upper part of your abdomen. Weight loss and muscle loss. Nausea. Loss of appetite. Later symptoms may include: Fatigue or weakness that is getting worse. Yellow skin and eyes (jaundice). Buildup of fluid in the abdomen (ascites). You may notice that your clothes are tight around your  waist. Weight gain and swelling of the feet and ankles (edema). Trouble breathing. Easy bruising and bleeding. Vomiting blood, or black or bloody stool. Mental confusion. How is this diagnosed? Your health care provider may suspect cirrhosis based on your symptoms and medical history, especially if you have other medical conditions or a history of alcohol abuse. Your health care provider will do a physical exam to feel your liver and to check for signs of cirrhosis. Tests may include: Blood tests to check: For hepatitis B or C. Kidney function. Liver function. Imaging tests such as: MRI or CT scan to look for changes seen in advanced cirrhosis. Ultrasound to see if normal liver tissue is being replaced by scar tissue. A procedure in which a long needle is used to take a sample of liver tissue to be checked in a lab (biopsy). Liver biopsy can confirm the diagnosis of cirrhosis. How is this treated? Treatment for this condition depends on how damaged your liver is and what caused the damage. It may include treating the symptoms of cirrhosis, or treating the underlying causes to slow the damage. Treatment may include: Making lifestyle changes, such as: Eating a healthy diet. You may need to work with your health care provider or a dietitian to develop an eating plan. Restricting salt intake. Maintaining a healthy weight. Not abusing drugs or alcohol. Taking medicines to: Treat liver infections or other infections. Control itching. Reduce fluid buildup. Reduce certain blood toxins. Reduce risk of bleeding from enlarged blood  vessels in the stomach or esophagus (varices). Liver transplant. In this procedure, a liver from a donor is used to replace your diseased liver. This is done if cirrhosis has caused liver failure. Other treatments and procedures may be done depending on the problems that you get from cirrhosis. Common problems include liver-related kidney failure (hepatorenal  syndrome). Follow these instructions at home:  Take medicines only as told by your health care provider. Do not use medicines that are toxic to your liver. Ask your health care provider before taking any new medicines, including over-the-counter medicines such as NSAIDs. Rest as needed. Eat a well-balanced diet. Limit your salt or water intake, if your health care provider asks you to do this. Do not drink alcohol. This is especially important if you routinely take acetaminophen . Keep all follow-up visits. This is important. Contact a health care provider if you: Have fatigue or weakness that is getting worse. Develop swelling of the hands, feet, or legs, or a buildup of fluid in the abdomen (ascites). Have a fever or chills. Develop loss of appetite. Have nausea or vomiting. Develop jaundice. Develop easy bruising or bleeding. Get help right away if you: Vomit bright red blood or a material that looks like coffee grounds. Have blood in your stools. Notice that your stools appear black and tarry. Become confused. Have chest pain or trouble breathing. These symptoms may represent a serious problem that is an emergency. Do not wait to see if the symptoms will go away. Get medical help right away. Call your local emergency services (911 in the [place] Do not drive yourself to the hospital. Summary Cirrhosis is chronic liver injury. Common causes are hepatitis C and long-term alcohol abuse. Tests used to diagnose cirrhosis include blood tests, imaging tests, and liver biopsy. Treatment for this condition involves treating the underlying cause. Avoid alcohol, drugs, salt, and medicines that may damage your liver. Get help right away if you vomit bright red blood or a material that looks like coffee grounds. This information is not intended to replace advice given to you by your health care provider. Make sure you discuss any questions you have with your health care provider. Document Revised:  08/29/2020 Document Reviewed: 08/29/2020 Elsevier Patient Education  2022 [employer].

## 2024-03-28 ENCOUNTER — Ambulatory Visit (HOSPITAL_BASED_OUTPATIENT_CLINIC_OR_DEPARTMENT_OTHER): Admission: RE | Admit: 2024-03-28 | Source: Ambulatory Visit | Admitting: Radiology

## 2024-03-28 LAB — IGA: Immunoglobulin A: 200 mg/dL (ref 47–310)

## 2024-03-28 LAB — HEPATITIS B SURFACE ANTIGEN: Hepatitis B Surface Ag: NONREACTIVE

## 2024-03-28 LAB — AFP TUMOR MARKER: AFP-Tumor Marker: 4.8 ng/mL (ref <6.1)

## 2024-03-28 LAB — IGG: IgG (Immunoglobin G), Serum: 1143 mg/dL (ref 600–1640)

## 2024-03-28 LAB — HEPATITIS B SURFACE ANTIBODY,QUALITATIVE: Hep B S Ab: NONREACTIVE

## 2024-03-28 LAB — ANTI-SMOOTH MUSCLE ANTIBODY, IGG: Actin (Smooth Muscle) Antibody (IGG): <20 U (ref <20)

## 2024-03-28 LAB — ANA: Anti Nuclear Antibody (ANA): NEGATIVE

## 2024-03-28 LAB — TISSUE TRANSGLUTAMINASE, IGA: (tTG) Ab, IgA: <1 U/mL

## 2024-03-28 LAB — MITOCHONDRIAL ANTIBODIES: Mitochondrial M2 Ab, IgG: <=20 U (ref <=20.0)

## 2024-03-28 LAB — HEPATITIS A ANTIBODY, TOTAL: Hepatitis A AB,Total: REACTIVE — AB

## 2024-03-29 ENCOUNTER — Ambulatory Visit: Admitting: Cardiology

## 2024-03-31 ENCOUNTER — Ambulatory Visit (INDEPENDENT_AMBULATORY_CARE_PROVIDER_SITE_OTHER)
Admission: RE | Admit: 2024-03-31 | Discharge: 2024-03-31 | Disposition: A | Discharge status: Home / Self Care | Source: Ambulatory Visit | Attending: Physician Assistant | Admitting: Physician Assistant

## 2024-03-31 DIAGNOSIS — N281 Cyst of kidney, acquired: Secondary | ICD-10-CM | POA: Diagnosis not present

## 2024-03-31 DIAGNOSIS — K7460 Unspecified cirrhosis of liver: Secondary | ICD-10-CM

## 2024-04-03 ENCOUNTER — Telehealth: Payer: Self-pay

## 2024-04-03 NOTE — Telephone Encounter (Signed)
 I spoke to Marcus Hicks and I advised him of his results.  I explained that [name] recommended a repeat US  in 6 months and we will send him a reminder to schedule.

## 2024-04-03 NOTE — Telephone Encounter (Signed)
-----   Message from Edmonia Gottron sent at 04/02/2024  8:52 PM EDT ----- No masses or abnormality of the liver Normal gallbladder, normal bile ducts.  Repeat RUQ US  6 months, please put in recall

## 2024-04-03 NOTE — Telephone Encounter (Signed)
 Lmtcb. (Re: lab results)

## 2024-04-10 ENCOUNTER — Ambulatory Visit: Payer: Medicaid Other | Admitting: Physician Assistant

## 2024-04-10 DIAGNOSIS — Z419 Encounter for procedure for purposes other than remedying health state, unspecified: Secondary | ICD-10-CM | POA: Diagnosis not present

## 2024-04-11 NOTE — Progress Notes (Signed)
 Agree with the assessment and plan as outlined by Quentin Mulling, PA-C. ? ?Keron Neenan, DO, FACG ? ?

## 2024-04-12 ENCOUNTER — Ambulatory Visit (INDEPENDENT_AMBULATORY_CARE_PROVIDER_SITE_OTHER): Admitting: Physician Assistant

## 2024-04-12 ENCOUNTER — Encounter: Payer: Self-pay | Admitting: Physician Assistant

## 2024-04-12 VITALS — BP 120/78 | HR 95 | Temp 97.5°F | Ht 64.0 in | Wt 212.4 lb

## 2024-04-12 DIAGNOSIS — E782 Mixed hyperlipidemia: Secondary | ICD-10-CM

## 2024-04-12 DIAGNOSIS — E1165 Type 2 diabetes mellitus with hyperglycemia: Secondary | ICD-10-CM | POA: Diagnosis not present

## 2024-04-12 DIAGNOSIS — I10 Essential (primary) hypertension: Secondary | ICD-10-CM

## 2024-04-12 NOTE — Progress Notes (Signed)
 Subjective:  Patient ID: Marcus Hicks, male    DOB: 05/28/1966  Age: 58 y.o. MRN: [id number]  Chief Complaint  Patient presents with   Medical Management of Chronic Issues    HPI  Pt with history of type 2 diabetes mellitus with diabetic polyneuropathy diagnosed in 2010- he is currently taking trulicity  4.5 mg weekly - he is taking jardiance  25 mg qd - he stopped actos  because he thought it was causing his second toes on each foot to turn red Last hgb A1c was 8.7  He is up to date on eye exam He is on statin treatment and is currently on ARB  Mixed hyperlipidemia  Pt presents with hyperlipidemia. Compliance with treatment has been fair - The patient is compliant with medications, maintains a low cholesterol diet , follows up as directed , and maintains an exercise regimen . The patient denies experiencing any hypercholesterolemia related symptoms. Currently taking crestor  40mg  qd  Pt currently follows cardiology for heart failure with reduced ejection fraction as well as CAD and atrial fibrillation - his current medications include losartan , metoprolol  , lasix  and potassium - He is currently not having any chest pain, pedal edema or dyspnea He follows with Dr Sandee Crook and Dr Lawana Pray - Next appt 05/30/24  Pt with COPD - he uses Anoro ellipta  and albuterol  neb as needed - currently not having any breathing issues  Pt with anxiety - uses xanax  as needed  Pt with primary insomnia - takes ambien  10mg  prn  Pt has established with [facility] for follow up of hepatitis - he states he is currently not on active treatment and will follow up with them regularly  Current Outpatient Medications on File Prior to Visit  Medication Sig Dispense Refill   acetaminophen  (TYLENOL ) 500 MG tablet Take 500 mg by mouth every 6 (six) hours as needed for mild pain or fever.     albuterol  (PROVENTIL ) (2.5 MG/3ML) 0.083% nebulizer solution Take 3 mLs (2.5 mg total) by nebulization every 6 (six) hours as  needed for wheezing.     ALPRAZolam  (XANAX ) 0.5 MG tablet TAKE 1 TABLET BY MOUTH EVERY DAY AS NEEDED 30 tablet 1   ANORO ELLIPTA  62.5-25 MCG/ACT AEPB INHALE 1 PUFF INTO THE LUNGS DAILY 60 each 0   Blood Glucose Monitoring Suppl DEVI 1 each by Does not apply route in the morning, at noon, and at bedtime. May substitute to any manufacturer covered by patient's insurance. 1 each 0   empagliflozin  (JARDIANCE ) 25 MG TABS tablet TAKE 1 TABLET(25 MG) BY MOUTH DAILY 90 tablet 0   furosemide  (LASIX ) 20 MG tablet TAKE 1 TABLET(20 MG) BY MOUTH DAILY 90 tablet 2   Insulin  Pen Needle (BD PEN NEEDLE NANO 2ND GEN) 32G X 4 MM MISC USE ONCE DAILY 100 each 3   losartan  (COZAAR ) 50 MG tablet TAKE 1 TABLET BY MOUTH EVERY DAY 30 tablet 2   metoprolol  tartrate (LOPRESSOR ) 25 MG tablet TAKE 1 TABLET(25 MG) BY MOUTH TWICE DAILY 180 tablet 1   Microlet Lancets MISC 1 each by Does not apply route daily in the afternoon. 100 each 2   potassium chloride  SA (KLOR-CON  M) 20 MEQ tablet TAKE 1 TABLET(20 MEQ) BY MOUTH TWICE DAILY 180 tablet 1   rosuvastatin  (CRESTOR ) 40 MG tablet TAKE 1 TABLET(40 MG) BY MOUTH DAILY 90 tablet 1   triamcinolone  cream (KENALOG ) 0.1 % Apply 1 Application topically 2 (two) times daily. (Patient taking differently: Apply 1 Application topically 2 (two) times daily.  As needed) 454 g 0   TRULICITY  4.5 MG/0.5ML SOAJ ADMINISTER 4.5 MG UNDER THE SKIN 1 TIME A WEEK AS DIRECTED 6 mL 1   zolpidem  (AMBIEN ) 10 MG tablet TAKE 1 TABLET(10 MG) BY MOUTH AT BEDTIME AS NEEDED FOR SLEEP (Patient not taking: Reported on 04/12/2024) 30 tablet 0   No current facility-administered medications on file prior to visit.   Past Medical History:  Diagnosis Date   Accretions on teeth 09/22/2021   Acquired thrombophilia (HCC) 08/11/2021   Acute kidney injury (HCC) 06/01/2021   Atrial fibrillation with rapid ventricular response (HCC) 08/12/2020   Bicuspid aortic valve    BMI 38.0-38.9,adult 11/26/2020   Bruxism (teeth grinding)  09/22/2021   Caries 09/22/2021   CHF (congestive heart failure) (HCC)    Chronic hepatitis C virus infection (HCC) 08/03/2013   Chronic periodontitis 09/22/2021   Cirrhosis of liver without ascites, unspecified hepatic cirrhosis type (HCC) 12/25/2021   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  Advised to get better sleep, walk on a regular basis, decrease weight down to about 210 and maintain, and eliminate tobacco.   Excessive dental attrition 09/22/2021   HFrEF (heart failure with reduced ejection fraction) (HCC) 08/22/2020   History of maze procedure 03/31/2022   Hyperactive gag reflex 09/22/2021   Hyperlipidemia 10/12/2016   Impacted third molar tooth 09/22/2021   Incipient enamel caries 09/22/2021   Long term (current) use of anticoagulants 09/22/2021   Long-term insulin  use (HCC) 10/12/2016   Morbid obesity (HCC) 10/12/2016   Patient advised to lose down to about 210 pounds and maintained.  Regular walking program recommended.  He knows how to lose weight.   Obesity, unspecified 10/12/2016   Patient advised to lose down to about 210 pounds and maintained.  Regular walking program recommended.  He knows how to lose weight.   Phobia of dental procedure 09/22/2021   Routine general medical examination at a health care facility 01/28/2021   S/P AVR (aortic valve replacement) and aortoplasty 10/28/2021   Sleep apnea    Teeth missing 09/22/2021   Thoracic aortic aneurysm without rupture, unspecified part (HCC) 03/31/2022   Type 2 diabetes  mellitus with diabetic polyneuropathy (HCC) 10/12/2016   Past Surgical History:  Procedure Laterality Date   AORTIC ROOT ENLARGEMENT  10/28/2021   Procedure: AORTIC ROOT ENLARGEMENT WITH HEMASHIELD PLATINUM X 30CM;  Surgeon: Hilarie Lovely, MD;  Location: [facility] OR;  Service: Open Heart Surgery;;   AORTIC VALVE REPLACEMENT N/A 10/28/2021   Procedure: AORTIC VALVE REPLACEMENT (AVR) WITH INSPIRIS RESILIA AORTIC VALVE SIZE ;  Surgeon: Hilarie Lovely, MD;  Location: [facility] OR;  Service: Open Heart Surgery;  Laterality: N/A;   ATRIAL FIBRILLATION ABLATION N/A 02/18/2023   Procedure: ATRIAL FIBRILLATION ABLATION;  Surgeon: Lei Pump, MD;  Location: [facility] INVASIVE CV LAB;  Service: Cardiovascular;  Laterality: N/A;   CARDIAC CATHETERIZATION     CARDIOVERSION N/A 09/24/2020   Procedure: CARDIOVERSION;  Surgeon: Wendie Hamburg, MD;  Location: [facility] ENDOSCOPY;  Service: Cardiovascular;  Laterality: N/A;  CARDIOVERSION N/A 08/13/2021   Procedure: CARDIOVERSION;  Surgeon: Euell Herrlich, MD;  Location: [facility] ENDOSCOPY;  Service: Cardiovascular;  Laterality: N/A;   CARDIOVERSION N/A 02/26/2022   Procedure: CARDIOVERSION;  Surgeon: Jerryl Morin, DO;  Location: [facility] ENDOSCOPY;  Service: Cardiovascular;  Laterality: N/A;   CCTA  08/2019   CLIPPING OF ATRIAL APPENDAGE N/A 10/28/2021   Procedure: CLIPPING OF ATRIAL APPENDAGE WITH ATRICLIP ACHV45;  Surgeon: Hilarie Lovely, MD;  Location: [facility] OR;  Service: Open Heart Surgery;  Laterality: N/A;   MAZE N/A 10/28/2021   Procedure: MAZE;  Surgeon: Hilarie Lovely, MD;  Location: [facility] OR;  Service: Open Heart Surgery;  Laterality: N/A;   RIGHT/LEFT HEART CATH AND CORONARY ANGIOGRAPHY N/A 09/26/2021   Procedure: RIGHT/LEFT HEART CATH AND CORONARY ANGIOGRAPHY;  Surgeon: Swazil[place], Peter M, MD;  Location[facility] INVASIVE CV LAB;  Service: Cardiovascular;  Laterality: N/A;   TEE WITHOUT CARDIOVERSION  09/24/2020   Procedure: TRANSESOPHAGEAL  ECHOCARDIOGRAM (TEE);  Surgeon: Wendie Hamburg, MD;  Location: [facility] ENDOSCOPY;  Service: Cardiovascular;;   TEE WITHOUT CARDIOVERSION N/A 10/28/2021   Procedure: TRANSESOPHAGEAL ECHOCARDIOGRAM (TEE);  Surgeon: Hilarie Lovely, MD;  Location: [facility] OR;  Service: Open Heart Surgery;  Laterality: N/A;    Family History  Problem Relation Age of Onset   Other Mother        pre-diabetes   Other Father        MVA   Liver disease Sister    Hypertension Sister    Alzheimer's disease Maternal Grandmother    Hypertension Maternal Grandmother    Lung cancer Maternal Grandfather    Esophageal cancer Neg Hx    Social History   Socioeconomic History   Marital status: Widowed    Spouse name: Not on file   Number of children: 3   Years of education: Not on file   Highest education level: Not on file  Occupational History   Occupation: disabled-private type  Tobacco Use   Smoking status: Former    Passive exposure: Past   Smokeless tobacco: Former    Types: Snuff   Tobacco comments:    Former snuff 08/11/2021  Vaping Use   Vaping status: Never Used  Substance and Sexual Activity   Alcohol use: Not Currently   Drug use: Never   Sexual activity: Never  Other Topics Concern   Not on file  Social History Narrative   Not on file   Social Drivers of Health   Financial Resource Strain: Low Risk  (08/09/2023)   Overall Financial Resource Strain (CARDIA)    Difficulty of Paying Living Expenses: Not hard at all  Food Insecurity: No Food Insecurity (08/09/2023)   Hunger Vital Sign    Worried About Running Out of Food in the Last Year: Never true    Ran Out of Food in the Last Year: Never true  Transportation Needs: No Transportation Needs (08/09/2023)   PRAPARE - Administrator, Civil Service (Medical): No    Lack of Transportation (Non-Medical): No  Physical Activity: Insufficiently Active (08/09/2023)   Exercise Vital Sign    Days of Exercise per Week: 4 days    Minutes  of Exercise per Session: 30 min  Stress: No Stress Concern Present (08/09/2023)   [employer] of Occupational Health - Occupational Stress Questionnaire    Feeling of Stress : Not at all  Social Connections: Socially Isolated (08/09/2023)   Social Connection and Isolation Panel [NHANES]    Frequency of Communication with Friends  and Family: More than three times a week    Frequency of Social Gatherings with Friends and Family: Three times a week    Attends Religious Services: Never    Active Member of Clubs or Organizations: No    Attends Banker Meetings: Never    Marital Status: Widowed   CONSTITUTIONAL: Negative for chills, fatigue, fever, unintentional weight gain and unintentional weight loss.  E/N/T: Negative for ear pain, nasal congestion and sore throat.  CARDIOVASCULAR: Negative for chest pain, dizziness, palpitations and pedal edema.  RESPIRATORY: Negative for recent cough and dyspnea.  GASTROINTESTINAL: Negative for abdominal pain, acid reflux symptoms, constipation, diarrhea, nausea and vomiting.  MSK: Negative for arthralgias and myalgias.  INTEGUMENTARY: Negative for rash.  NEUROLOGICAL: Negative for dizziness and headaches.  PSYCHIATRIC: Negative for sleep disturbance and to question depression screen.  Negative for depression, negative for anhedonia.      Objective:  PHYSICAL EXAM:   VS: BP 120/78   Pulse 95   Temp (!) 97.5 F (36.4 C)   Ht 5\' 4"  (1.626 m)   Wt 212 lb 6.4 oz (96.3 kg)   SpO2 96%   BMI 36.46 kg/m   GEN: Well nourished, well developed, in no acute distress  Cardiac: RRR; no murmurs, rubs, or gallops,no edema -  Respiratory:  normal respiratory rate and pattern with no distress - normal breath sounds with no rales, rhonchi, wheezes or rubs  MS: no deformity or atrophy  Skin: warm and dry, no rash  Neuro:  Alert and Oriented x 3, - CN II-Xii grossly intact Psych: euthymic mood, appropriate affect and demeanor  Lab Results   Component Value Date   WBC 6.2 03/24/2024   HGB 17.0 03/24/2024   HCT 50.3 03/24/2024   PLT 174.0 03/24/2024   GLUCOSE 146 (H) 03/24/2024   CHOL 154 12/09/2023   TRIG 181 (H) 12/09/2023   HDL 39 (L) 12/09/2023   LDLCALC 84 12/09/2023   ALT 43 03/24/2024   AST 34 03/24/2024   NA 137 03/24/2024   K 4.4 03/24/2024   CL 96 03/24/2024   CREATININE 1.02 03/24/2024   BUN 16 03/24/2024   CO2 32 03/24/2024   TSH 1.150 12/09/2023   INR 1.1 (H) 03/24/2024   HGBA1C 8.7 (H) 12/09/2023   MICROALBUR 30 04/09/2021      Assessment & Plan:   Problem List Items Addressed This Visit       Cardiovascular and Mediastinum   Hypertension associated with diabetes (HCC)   Relevant Medications   aspirin  EC 81 MG tablet   Other Relevant Orders   CBC with Differential/Platelet   Comprehensive metabolic panel   TSH Continue current meds   HFrEF (heart failure with reduced ejection fraction) (HCC)   Relevant Medications   aspirin  EC 81 MG tablet Continue follow up with cardiology Continue meds   Dysrhythmia   Relevant Medications   aspirin  EC 81 MG tablet   Other Relevant Orders   TSH Continue meds Follow with cardiology as directed   Coronary artery disease   Relevant Medications   aspirin  EC 81 MG tablet Continue statin     Respiratory   COPD (chronic obstructive pulmonary disease) (HCC) Continue meds                Endocrine   Type 2 diabetes mellitus with diabetic polyneuropathy (HCC) - Primary   Relevant Medications      aspirin  EC 81 MG tablet   Dulaglutide  (TRULICITY ) 4.5mg  weekly  Microlet Lancets MISC   Other Relevant Orders   CBC with Differential/Platelet   Comprehensive metabolic panel   Hemoglobin A1c        Other   Hyperlipidemia associated with diabetes (HCC)   Relevant Medications   aspirin  EC 81 MG tablet   Other Relevant Orders   Lipid panel   Other Visit Diagnoses     Adjustment insomnia       Continue ambien  prn         History of hep  C Follow up with GI as scheduled             .  No orders of the defined types were placed in this encounter.   No orders of the defined types were placed in this encounter.    Follow-up: Return in about 4 months (around 08/13/2024) for chronic fasting follow-up.  An After Visit Summary was printed and given to the patient.  Anthonette Bastos [facility] [phone]

## 2024-04-13 ENCOUNTER — Ambulatory Visit: Payer: Self-pay | Admitting: Physician Assistant

## 2024-04-13 LAB — CBC WITH DIFFERENTIAL/PLATELET
Basophils Absolute: 0.1 10*3/uL (ref 0.0–0.2)
Basos: 1 %
EOS (ABSOLUTE): 0.1 10*3/uL (ref 0.0–0.4)
Eos: 2 %
Hematocrit: 51.8 % — ABNORMAL HIGH (ref 37.5–51.0)
Hemoglobin: 17.2 g/dL (ref 13.0–17.7)
Immature Grans (Abs): 0 10*3/uL (ref 0.0–0.1)
Immature Granulocytes: 0 %
Lymphocytes Absolute: 1.2 10*3/uL (ref 0.7–3.1)
Lymphs: 20 %
MCH: 30.2 pg (ref 26.6–33.0)
MCHC: 33.2 g/dL (ref 31.5–35.7)
MCV: 91 fL (ref 79–97)
Monocytes Absolute: 0.5 10*3/uL (ref 0.1–0.9)
Monocytes: 9 %
Neutrophils Absolute: 4.2 10*3/uL (ref 1.4–7.0)
Neutrophils: 68 %
Platelets: 177 10*3/uL (ref 150–450)
RBC: 5.7 x10E6/uL (ref 4.14–5.80)
RDW: 12.7 % (ref 11.6–15.4)
WBC: 6.1 10*3/uL (ref 3.4–10.8)

## 2024-04-13 LAB — COMPREHENSIVE METABOLIC PANEL WITH GFR
ALT: 50 IU/L — ABNORMAL HIGH (ref 0–44)
AST: 37 IU/L (ref 0–40)
Albumin: 5.6 g/dL — ABNORMAL HIGH (ref 3.8–4.9)
Alkaline Phosphatase: 62 IU/L (ref 44–121)
BUN/Creatinine Ratio: 16 (ref 9–20)
BUN: 15 mg/dL (ref 6–24)
Bilirubin Total: 0.5 mg/dL (ref 0.0–1.2)
CO2: 24 mmol/L (ref 20–29)
Calcium: 10.1 mg/dL (ref 8.7–10.2)
Chloride: 98 mmol/L (ref 96–106)
Creatinine, Ser: 0.93 mg/dL (ref 0.76–1.27)
Globulin, Total: 2.4 g/dL (ref 1.5–4.5)
Glucose: 142 mg/dL — ABNORMAL HIGH (ref 70–99)
Potassium: 4.7 mmol/L (ref 3.5–5.2)
Sodium: 143 mmol/L (ref 134–144)
Total Protein: 8 g/dL (ref 6.0–8.5)
eGFR: 95 mL/min/{1.73_m2} (ref >59)

## 2024-04-13 LAB — HEMOGLOBIN A1C
Est. average glucose Bld gHb Est-mCnc: 217 mg/dL
Hgb A1c MFr Bld: 9.2 % — ABNORMAL HIGH (ref 4.8–5.6)

## 2024-04-13 LAB — LIPID PANEL
Chol/HDL Ratio: 4.2 ratio (ref 0.0–5.0)
Cholesterol, Total: 156 mg/dL (ref 100–199)
HDL: 37 mg/dL — ABNORMAL LOW (ref >39)
LDL Chol Calc (NIH): 84 mg/dL (ref 0–99)
Triglycerides: 205 mg/dL — ABNORMAL HIGH (ref 0–149)
VLDL Cholesterol Cal: 35 mg/dL (ref 5–40)

## 2024-04-19 ENCOUNTER — Other Ambulatory Visit: Payer: Self-pay | Admitting: Physician Assistant

## 2024-04-19 DIAGNOSIS — F5102 Adjustment insomnia: Secondary | ICD-10-CM

## 2024-05-11 DIAGNOSIS — Z419 Encounter for procedure for purposes other than remedying health state, unspecified: Secondary | ICD-10-CM | POA: Diagnosis not present

## 2024-05-15 ENCOUNTER — Telehealth: Payer: Self-pay

## 2024-05-15 NOTE — Telephone Encounter (Signed)
 Trulicity  PA has been submitted waiting on insurance  Copied from CRM [id number]. Topic: Clinical - Medication Prior Auth >> May 15, 2024 11:07 AM Marcus Hicks wrote: Reason for CRM: Patient is calling to request a PA per pharmacy for TRULICITY  4.5 MG/0.5ML SOAJ [[id number]]

## 2024-05-19 ENCOUNTER — Other Ambulatory Visit: Payer: Self-pay | Admitting: Physician Assistant

## 2024-05-19 DIAGNOSIS — F419 Anxiety disorder, unspecified: Secondary | ICD-10-CM

## 2024-05-30 ENCOUNTER — Ambulatory Visit: Attending: Cardiology

## 2024-05-30 VITALS — BP 105/71 | HR 87 | Ht 64.0 in | Wt 207.6 lb

## 2024-05-30 DIAGNOSIS — I5032 Chronic diastolic (congestive) heart failure: Secondary | ICD-10-CM | POA: Insufficient documentation

## 2024-05-30 DIAGNOSIS — Q2381 Bicuspid aortic valve: Secondary | ICD-10-CM | POA: Insufficient documentation

## 2024-05-30 DIAGNOSIS — I10 Essential (primary) hypertension: Secondary | ICD-10-CM | POA: Insufficient documentation

## 2024-05-30 DIAGNOSIS — E782 Mixed hyperlipidemia: Secondary | ICD-10-CM | POA: Diagnosis not present

## 2024-05-30 DIAGNOSIS — Z952 Presence of prosthetic heart valve: Secondary | ICD-10-CM | POA: Diagnosis not present

## 2024-05-30 DIAGNOSIS — I2510 Atherosclerotic heart disease of native coronary artery without angina pectoris: Secondary | ICD-10-CM | POA: Diagnosis not present

## 2024-05-30 DIAGNOSIS — I4819 Other persistent atrial fibrillation: Secondary | ICD-10-CM | POA: Insufficient documentation

## 2024-05-30 MED ORDER — ASPIRIN 81 MG PO TBEC: 81.0000 mg | DELAYED_RELEASE_TABLET | Freq: Every day | ORAL | 3 refills | Status: DC

## 2024-05-30 NOTE — Assessment & Plan Note (Signed)
 S/p aortic valve replacement and aortoplasty in 2022

## 2024-05-30 NOTE — Patient Instructions (Signed)
 Medication Instructions:  Your physician has recommended you make the following change in your medication:   Start coated Aspirin  81 mg daily  *If you need a refill on your cardiac medications before your next appointment, please call your pharmacy*   Lab Work: None ordered If you have labs (blood work) drawn today and your tests are completely normal, you will receive your results only by: MyChart Message (if you have MyChart) OR A paper copy in the mail If you have any lab test that is abnormal or we need to change your treatment, we will call you to review the results.  Testing/Procedures: Your physician has requested that you have an echocardiogram. Echocardiography is a painless test that uses sound waves to create images of your heart. It provides your doctor with information about the size and shape of your heart and how well your heart's chambers and valves are working. This procedure takes approximately one hour. There are no restrictions for this procedure. Please do NOT wear cologne, perfume, aftershave, or lotions (deodorant is allowed). Please arrive 15 minutes prior to your appointment time.  Please note: We ask at that you not bring children with you during ultrasound (echo/ vascular) testing. Due to room size and safety concerns, children are not allowed in the ultrasound rooms during exams. Our front office staff cannot provide observation of children in our lobby area while testing is being conducted. An adult accompanying a patient to their appointment will only be allowed in the ultrasound room at the discretion of the ultrasound technician under special circumstances. We apologize for any inconvenience.  Follow-Up: At [facility], you and your health needs are our priority.  As part of our continuing mission to provide you with exceptional heart care, we have created designated Provider Care Teams.  These Care Teams include your primary Cardiologist (physician) and  Advanced Practice Providers (APPs -  Physician Assistants and Nurse Practitioners) who all work together to provide you with the care you need, when you need it.  We recommend signing up for the patient portal called MyChart.  Sign up information is provided on this After Visit Summary.  MyChart is used to connect with patients for Virtual Visits (Telemedicine).  Patients are able to view lab/test results, encounter notes, upcoming appointments, etc.  Non-urgent messages can be sent to your provider as well.   To learn more about what you can do with MyChart, go to [email or web address].    Your next appointment:   6 month(s)  The format for your next appointment:   In Person  Provider:   Alean Madireddy, MD   Other Instructions Echocardiogram An echocardiogram is a test that uses sound waves (ultrasound) to produce images of the heart. Images from an echocardiogram can provide important information about: Heart size and shape. The size and thickness and movement of your heart's walls. Heart muscle function and strength. Heart valve function or if you have stenosis. Stenosis is when the heart valves are too narrow. If blood is flowing backward through the heart valves (regurgitation). A tumor or infectious growth around the heart valves. Areas of heart muscle that are not working well because of poor blood flow or injury from a heart attack. Aneurysm detection. An aneurysm is a weak or damaged part of an artery wall. The wall bulges out from the normal force of blood pumping through the body. Tell a health care provider about: Any allergies you have. All medicines you are taking, including vitamins, herbs, eye drops, creams,  and over-the-counter medicines. Any blood disorders you have. Any surgeries you have had. Any medical conditions you have. Whether you are pregnant or may be pregnant. What are the risks? Generally, this is a safe test. However, problems may occur,  including an allergic reaction to dye (contrast) that may be used during the test. What happens before the test? No specific preparation is needed. You may eat and drink normally. What happens during the test? You will take off your clothes from the waist up and put on a hospital gown. Electrodes or electrocardiogram (ECG)patches may be placed on your chest. The electrodes or patches are then connected to a device that monitors your heart rate and rhythm. You will lie down on a table for an ultrasound exam. A gel will be applied to your chest to help sound waves pass through your skin. A handheld device, called a transducer, will be pressed against your chest and moved over your heart. The transducer produces sound waves that travel to your heart and bounce back (or echo back) to the transducer. These sound waves will be captured in real-time and changed into images of your heart that can be viewed on a video monitor. The images will be recorded on a computer and reviewed by your health care provider. You may be asked to change positions or hold your breath for a short time. This makes it easier to get different views or better views of your heart. In some cases, you may receive contrast through an IV in one of your veins. This can improve the quality of the pictures from your heart. The procedure may vary among health care providers and hospitals.   What can I expect after the test? You may return to your normal, everyday life, including diet, activities, and medicines, unless your health care provider tells you not to do that. Follow these instructions at home: It is up to you to get the results of your test. Ask your health care provider, or the department that is doing the test, when your results will be ready. Keep all follow-up visits. This is important. Summary An echocardiogram is a test that uses sound waves (ultrasound) to produce images of the heart. Images from an echocardiogram can  provide important information about the size and shape of your heart, heart muscle function, heart valve function, and other possible heart problems. You do not need to do anything to prepare before this test. You may eat and drink normally. After the echocardiogram is completed, you may return to your normal, everyday life, unless your health care provider tells you not to do that. This information is not intended to replace advice given to you by your health care provider. Make sure you discuss any questions you have with your health care provider. Document Revised: 07/09/2020 Document Reviewed: 07/09/2020 Elsevier Patient Education  2021 Elsevier Inc.   Important Information About Sugar

## 2024-05-30 NOTE — Assessment & Plan Note (Signed)
 Well-controlled. Continue current medications losartan , metoprolol  titrate at current doses as discussed with.

## 2024-05-30 NOTE — Assessment & Plan Note (Signed)
 Lipid panel from 04/12/2024 LDL 84, HDL 37, total cholesterol 843 and triglycerides 205. Continue rosuvastatin  40 mg once daily. Tolerating well

## 2024-05-30 NOTE — Assessment & Plan Note (Signed)
 Nonobstructive disease on presurgical cardiac cath October 2022. Would recommend staying on aspirin  81 mg once daily which she has tolerated well in the past. Remains on rosuvastatin  40 mg once daily.

## 2024-05-30 NOTE — Assessment & Plan Note (Signed)
 Doing well. Needs follow-up interval echocardiogram for valve function assessment has been 2 years.  Advised about endocarditis prophylaxis prior to dental procedures or colonoscopy or any other surgical procedures.

## 2024-05-30 NOTE — Assessment & Plan Note (Signed)
 On rate control strategy. Remains asymptomatic. Maze procedure at the time of valve surgery November 2022 along with left atrial appendage clip 45 mm AtriClip.  S/p ablation February 18, 2023.  Successful. Not on anticoagulation.

## 2024-05-30 NOTE — Progress Notes (Signed)
 Cardiology Consultation:    Date:  05/30/2024   ID:  Marcus Hicks, DOB 1966/01/20, MRN [id number]  PCP:  Nicholaus Credit, PA-C  Cardiologist:  Alean SAUNDERS Mika Griffitts, MD   Referring MD: Nicholaus Credit, PA-C   No chief complaint on file.    ASSESSMENT AND PLAN:   Marcus Hicks 58 year old male history of bicuspid aortic valve surgical aortic valve 25 mm Inspiris and aortic root enlargement along with maze and 45 mm left atrial appendage clip [10/28/2021], atrial fibrillation on rhythm control doing well after surgical maze and more recently ablation 02-18-2023, nonobstructive CAD on cardiac cath October 2022 prior to surgery, chronic diastolic CHF, hyperlipidemia, cirrhosis, Raynaud's phenomena, diabetes mellitus, obesity. Last echocardiogram February 2023 with LVEF 50 to 55%, bioprosthetic aortic valve with mean gradient 8 mmHg, mild TR  Here for routine follow-up visit. Problem List Items Addressed This Visit     Essential hypertension   Well-controlled. Continue current medications losartan , metoprolol  titrate at current doses as discussed with.      Relevant Medications   aspirin  EC 81 MG tablet   Hyperlipidemia   Lipid panel from 04/12/2024 LDL 84, HDL 37, total cholesterol 843 and triglycerides 205. Continue rosuvastatin  40 mg once daily. Tolerating well      Relevant Medications   aspirin  EC 81 MG tablet   S/P AVR (aortic valve replacement) and aortoplasty   Doing well. Needs follow-up interval echocardiogram for valve function assessment has been 2 years.  Advised about endocarditis prophylaxis prior to dental procedures or colonoscopy or any other surgical procedures.       Relevant Orders   ECHOCARDIOGRAM COMPLETE   Coronary artery disease   Nonobstructive disease on presurgical cardiac cath October 2022. Would recommend staying on aspirin  81 mg once daily which she has tolerated well in the past. Remains on rosuvastatin  40 mg once daily.       Relevant  Medications   aspirin  EC 81 MG tablet   Other Relevant Orders   ECHOCARDIOGRAM COMPLETE   CHF (congestive heart failure) (HCC)   Well compensated euvolemic, good functional status NYHA class I.  Advised about salt restriction. Advised about daily weight monitoring.  Remains on furosemide  20 mg once daily. On potassium replacement with 20 mEq twice daily. Recent CMP unremarkable with potassium levels normal at 4.7.  On guideline directed medical therapy with losartan  50 mg once daily Metoprolol  tartrate 25 mg twice daily also on Jardiance  diabetic dose 25 mg once daily tolerating well.       Relevant Medications   aspirin  EC 81 MG tablet   Other Relevant Orders   ECHOCARDIOGRAM COMPLETE   Persistent atrial fibrillation (HCC) - Primary   On rate control strategy. Remains asymptomatic. Maze procedure at the time of valve surgery November 2022 along with left atrial appendage clip 45 mm AtriClip.  S/p ablation February 18, 2023.  Successful. Not on anticoagulation.       Relevant Medications   aspirin  EC 81 MG tablet   Other Relevant Orders   ECHOCARDIOGRAM COMPLETE   Bicuspid aortic valve   S/p aortic valve replacement and aortoplasty in 2022      Relevant Medications   aspirin  EC 81 MG tablet   Other Relevant Orders   ECHOCARDIOGRAM COMPLETE   Return to clinic in 6 months.   History of Present Illness:    Marcus Hicks is a 58 y.o. male who is being seen today for follow-up visit. PCP is Nicholaus Credit, PA-C. Last visit at our  office was with Dr. Monetta 09/30/2023.  Has history of bicuspid aortic valve surgical aortic valve 25 mm Inspiris and aortic root enlargement along with maze and 45 mm left atrial appendage clip [10/28/2021], atrial fibrillation on rhythm control doing well after surgical maze and more recently ablation 02-18-2023, nonobstructive CAD on cardiac cath October 2022 prior to surgery, chronic diastolic CHF, hyperlipidemia, cirrhosis, Raynaud's  phenomena, diabetes mellitus, obesity. Last echocardiogram February 2023 with LVEF 50 to 55%, bioprosthetic aortic valve with mean gradient 8 mmHg, mild TR  Lipid panel from 04/12/2024 total cholesterol 156, triglycerides 205, HDL 37, LDL 84. Hemoglobin A1c elevated 9.2. CMP with mildly elevated ALT 50, albumin  5.6 CBC unremarkable except for hematocrit mildly elevated 51.8.  Last echocardiogram to review is from September 30, 2023 showing sinus rhythm with heart rate 99/min  Here for routine follow-up visit. Doing well. Walks regularly 2-3 times a week up to 3 miles without any limitation. Denies any palpitations, lightheadedness, dizziness or syncopal episodes. Denies any blood in urine or stools. No significant weight changes. No pedal edema.  Good compliance with all his medications. Notably he is currently not on any antiplatelet agent. No significant side effects while on aspirin  in the past.  Good compliance with his medications including potassium chloride  supplements due to history of hypokalemia Past Medical History:  Diagnosis Date   Accretions on teeth 09/22/2021   Acquired thrombophilia (HCC) 08/11/2021   Acute kidney injury (HCC) 06/01/2021   Atrial fibrillation with rapid ventricular response (HCC) 08/12/2020   Bicuspid aortic valve    BMI 38.0-38.9,adult 11/26/2020   Bruxism (teeth grinding) 09/22/2021   Caries 09/22/2021   CHF (congestive heart failure) (HCC)    Chronic hepatitis C virus infection (HCC) 08/03/2013   Chronic periodontitis 09/22/2021   Cirrhosis of liver without ascites, unspecified hepatic cirrhosis type (HCC) 12/25/2021   Clinical xerostomia 09/22/2021   COPD (chronic obstructive pulmonary disease) (HCC)    Coronary artery disease    COVID-19 virus infection 06/01/2021   Diabetes mellitus type 2, insulin  dependent (HCC)    Diastema 09/22/2021   Dyslipidemia 07/03/2015   Dysrhythmia    A Fib   Encounter for follow-up for aortic valve replacement  12/25/2021   Encounter for preoperative dental examination 09/22/2021   Essential hypertension 07/03/2015   Last Assessment & Plan:  Blood pressure is borderline high.  Advised to get better sleep, walk on a regular basis, decrease weight down to about 210 and maintain, and eliminate tobacco.   Excessive dental attrition 09/22/2021   HFrEF (heart failure with reduced ejection fraction) (HCC) 08/22/2020   History of maze procedure 03/31/2022   Hyperactive gag reflex 09/22/2021   Hyperlipidemia 10/12/2016   Impacted third molar tooth 09/22/2021   Incipient enamel caries 09/22/2021   Long term (current) use of anticoagulants 09/22/2021   Long-term insulin  use (HCC) 10/12/2016   Morbid obesity (HCC) 10/12/2016   Patient advised to lose down to about 210 pounds and maintained.  Regular walking program recommended.  He knows how to lose weight.   Obesity, unspecified 10/12/2016   Patient advised to lose down to about 210 pounds and maintained.  Regular walking program recommended.  He knows how to lose weight.   Phobia of dental procedure 09/22/2021   Routine general medical examination at a health care facility 01/28/2021   S/P AVR (aortic valve replacement) and aortoplasty 10/28/2021   Sleep apnea    Teeth missing 09/22/2021   Thoracic aortic aneurysm without rupture, unspecified part (HCC) 03/31/2022  Type 2 diabetes mellitus with diabetic polyneuropathy (HCC) 10/12/2016    Past Surgical History:  Procedure Laterality Date   AORTIC ROOT ENLARGEMENT  10/28/2021   Procedure: AORTIC ROOT ENLARGEMENT WITH HEMASHIELD PLATINUM X 30CM;  Surgeon: Shyrl Linnie KIDD, MD;  Location: [facility] OR;  Service: Open Heart Surgery;;   AORTIC VALVE REPLACEMENT N/A 10/28/2021   Procedure: AORTIC VALVE REPLACEMENT (AVR) WITH INSPIRIS RESILIA AORTIC VALVE SIZE ;  Surgeon: Shyrl Linnie KIDD, MD;  Location: [facility] OR;  Service: Open Heart Surgery;  Laterality: N/A;   ATRIAL FIBRILLATION ABLATION N/A 02/18/2023    Procedure: ATRIAL FIBRILLATION ABLATION;  Surgeon: Inocencio Soyla Lunger, MD;  Location: [facility] INVASIVE CV LAB;  Service: Cardiovascular;  Laterality: N/A;   CARDIAC CATHETERIZATION     CARDIOVERSION N/A 09/24/2020   Procedure: CARDIOVERSION;  Surgeon: Kate Lonni CROME, MD;  Location: [facility] ENDOSCOPY;  Service: Cardiovascular;  Laterality: N/A;   CARDIOVERSION N/A 08/13/2021   Procedure: CARDIOVERSION;  Surgeon: Loni Soyla LABOR, MD;  Location: [facility] ENDOSCOPY;  Service: Cardiovascular;  Laterality: N/A;   CARDIOVERSION N/A 02/26/2022   Procedure: CARDIOVERSION;  Surgeon: Sheena Pugh, DO;  Location: [facility] ENDOSCOPY;  Service: Cardiovascular;  Laterality: N/A;   CCTA  08/2019   CLIPPING OF ATRIAL APPENDAGE N/A 10/28/2021   Procedure: CLIPPING OF ATRIAL APPENDAGE WITH ATRICLIP ACHV45;  Surgeon: Shyrl Linnie KIDD, MD;  Location: [facility] OR;  Service: Open Heart Surgery;  Laterality: N/A;   MAZE N/A 10/28/2021   Procedure: MAZE;  Surgeon: Shyrl Linnie KIDD, MD;  Location: [facility] OR;  Service: Open Heart Surgery;  Laterality: N/A;   RIGHT/LEFT HEART CATH AND CORONARY ANGIOGRAPHY N/A 09/26/2021   Procedure: RIGHT/LEFT HEART CATH AND CORONARY ANGIOGRAPHY;  Surgeon: [place]az[place], Peter M, MD;  [facility] INVASIVE CV LAB;  Service: Cardiovascular;  Laterality: N/A;   TEE WITHOUT CARDIOVERSION  09/24/2020   Procedure: TRANSESOPHAGEAL ECHOCARDIOGRAM (TEE);  Surgeon: Kate Lonni CROME, MD;  Location: [facility] ENDOSCOPY;  Service: Cardiovascular;;   TEE WITHOUT CARDIOVERSION N/A 10/28/2021   Procedure: TRANSESOPHAGEAL ECHOCARDIOGRAM (TEE);  Surgeon: Shyrl Linnie KIDD, MD;  Location: [facility] OR;  Service: Open Heart Surgery;  Laterality: N/A;    Current Medications: Current Meds  Medication Sig   acetaminophen  (TYLENOL ) 500 MG tablet Take 500 mg by mouth every 6 (six) hours as needed for mild pain or fever.   albuterol  (PROVENTIL ) (2.5 MG/3ML) 0.083% nebulizer solution Take 3 mLs (2.5 mg total) by nebulization every 6  (six) hours as needed for wheezing.   albuterol  (VENTOLIN  HFA) 108 (90 Base) MCG/ACT inhaler Inhale into the lungs every 6 (six) hours as needed for wheezing or shortness of breath.   ALPRAZolam  (XANAX ) 0.5 MG tablet TAKE 1 TABLET BY MOUTH EVERY DAY AS NEEDED   ANORO ELLIPTA  62.5-25 MCG/ACT AEPB INHALE 1 PUFF INTO THE LUNGS DAILY   aspirin  EC 81 MG tablet Take 1 tablet (81 mg total) by mouth daily. Swallow whole.   Blood Glucose Monitoring Suppl DEVI 1 each by Does not apply route in the morning, at noon, and at bedtime. May substitute to any manufacturer covered by patient's insurance.   empagliflozin  (JARDIANCE ) 25 MG TABS tablet TAKE 1 TABLET(25 MG) BY MOUTH DAILY   furosemide  (LASIX ) 20 MG tablet TAKE 1 TABLET(20 MG) BY MOUTH DAILY   Insulin  Pen Needle (BD PEN NEEDLE NANO 2ND GEN) 32G X 4 MM MISC USE ONCE DAILY   losartan  (COZAAR ) 50 MG tablet TAKE 1 TABLET BY MOUTH EVERY DAY   metoprolol  tartrate (LOPRESSOR ) 25 MG tablet  TAKE 1 TABLET(25 MG) BY MOUTH TWICE DAILY   Microlet Lancets MISC 1 each by Does not apply route daily in the afternoon.   potassium chloride  SA (KLOR-CON  M) 20 MEQ tablet TAKE 1 TABLET(20 MEQ) BY MOUTH TWICE DAILY   rosuvastatin  (CRESTOR ) 40 MG tablet TAKE 1 TABLET(40 MG) BY MOUTH DAILY   triamcinolone  cream (KENALOG ) 0.1 % Apply 1 Application topically 2 (two) times daily. (Patient taking differently: Apply 1 Application topically 2 (two) times daily. As needed)   TRULICITY  4.5 MG/0.5ML SOAJ ADMINISTER 4.5 MG UNDER THE SKIN 1 TIME A WEEK AS DIRECTED   zolpidem  (AMBIEN ) 10 MG tablet TAKE 1 TABLET(10 MG) BY MOUTH AT BEDTIME AS NEEDED FOR SLEEP     Allergies:   Amiodarone , Glucophage  [metformin ], and Pioglitazone    Social History   Socioeconomic History   Marital status: Widowed    Spouse name: Not on file   Number of children: 3   Years of education: Not on file   Highest education level: Not on file  Occupational History   Occupation: disabled-private type   Tobacco Use   Smoking status: Former    Passive exposure: Past   Smokeless tobacco: Former    Types: Snuff   Tobacco comments:    Former snuff 08/11/2021  Vaping Use   Vaping status: Never Used  Substance and Sexual Activity   Alcohol use: Not Currently   Drug use: Never   Sexual activity: Never  Other Topics Concern   Not on file  Social History Narrative   Not on file   Social Drivers of Health   Financial Resource Strain: Low Risk  (08/09/2023)   Overall Financial Resource Strain (CARDIA)    Difficulty of Paying Living Expenses: Not hard at all  Food Insecurity: No Food Insecurity (08/09/2023)   Hunger Vital Sign    Worried About Running Out of Food in the Last Year: Never true    Ran Out of Food in the Last Year: Never true  Transportation Needs: No Transportation Needs (08/09/2023)   PRAPARE - Administrator, Civil Service (Medical): No    Lack of Transportation (Non-Medical): No  Physical Activity: Insufficiently Active (08/09/2023)   Exercise Vital Sign    Days of Exercise per Week: 4 days    Minutes of Exercise per Session: 30 min  Stress: No Stress Concern Present (08/09/2023)   [employer] of Occupational Health - Occupational Stress Questionnaire    Feeling of Stress : Not at all  Social Connections: Socially Isolated (08/09/2023)   Social Connection and Isolation Panel    Frequency of Communication with Friends and Family: More than three times a week    Frequency of Social Gatherings with Friends and Family: Three times a week    Attends Religious Services: Never    Active Member of Clubs or Organizations: No    Attends Banker Meetings: Never    Marital Status: Widowed     Family History: The patient's family history includes Alzheimer's disease in his maternal grandmother; Hypertension in his maternal grandmother and sister; Liver disease in his sister; Lung cancer in his maternal grandfather; Other in his father and mother. There is  no history of Esophageal cancer. ROS:   Please see the history of present illness.    All 14 point review of systems negative except as described per history of present illness.  EKGs/Labs/Other Studies Reviewed:    The following studies were reviewed today:   EKG:  Recent Labs: 06/29/2023: NT-Pro BNP 269 12/09/2023: TSH 1.150 04/12/2024: ALT 50; BUN 15; Creatinine, Ser 0.93; Hemoglobin 17.2; Platelets 177; Potassium 4.7; Sodium 143  Recent Lipid Panel    Component Value Date/Time   CHOL 156 04/12/2024 1530   TRIG 205 (H) 04/12/2024 1530   HDL 37 (L) 04/12/2024 1530   CHOLHDL 4.2 04/12/2024 1530   LDLCALC 84 04/12/2024 1530    Physical Exam:    VS:  BP 105/71 (BP Location: Left Arm)   Pulse 87   Ht 5' 4 (1.626 m)   Wt 207 lb 9.6 oz (94.2 kg)   SpO2 95%   BMI 35.63 kg/m     Wt Readings from Last 3 Encounters:  05/30/24 207 lb 9.6 oz (94.2 kg)  04/12/24 212 lb 6.4 oz (96.3 kg)  03/24/24 209 lb 6 oz (95 kg)     GENERAL:  Well nourished, well developed in no acute distress NECK: No JVD; No carotid bruits CARDIAC: RRR, S1 and S2 present, no murmurs, no rubs, no gallops CHEST:  Clear to auscultation without rales, wheezing or rhonchi  Extremities: No pitting pedal edema. Pulses bilaterally symmetric with radial 2+ and dorsalis pedis 2+ NEUROLOGIC:  Alert and oriented x 3  Medication Adjustments/Labs and Tests Ordered: Current medicines are reviewed at length with the patient today.  Concerns regarding medicines are outlined above.  Orders Placed This Encounter  Procedures   ECHOCARDIOGRAM COMPLETE   Meds ordered this encounter  Medications   aspirin  EC 81 MG tablet    Sig: Take 1 tablet (81 mg total) by mouth daily. Swallow whole.    Dispense:  90 tablet    Refill:  3    Signed, Kaede Clendenen reddy Dmitriy Gair, MD, MPH, [facility]. 05/30/2024 3:21 PM    [facility]

## 2024-05-30 NOTE — Assessment & Plan Note (Signed)
 Well compensated euvolemic, good functional status NYHA class I.  Advised about salt restriction. Advised about daily weight monitoring.  Remains on furosemide  20 mg once daily. On potassium replacement with 20 mEq twice daily. Recent CMP unremarkable with potassium levels normal at 4.7.  On guideline directed medical therapy with losartan  50 mg once daily Metoprolol  tartrate 25 mg twice daily also on Jardiance  diabetic dose 25 mg once daily tolerating well.

## 2024-06-03 ENCOUNTER — Other Ambulatory Visit: Payer: Self-pay | Admitting: Family Medicine

## 2024-06-08 ENCOUNTER — Other Ambulatory Visit: Payer: Self-pay | Admitting: Physician Assistant

## 2024-06-08 DIAGNOSIS — E1165 Type 2 diabetes mellitus with hyperglycemia: Secondary | ICD-10-CM

## 2024-06-10 DIAGNOSIS — Z419 Encounter for procedure for purposes other than remedying health state, unspecified: Secondary | ICD-10-CM | POA: Diagnosis not present

## 2024-06-17 ENCOUNTER — Other Ambulatory Visit: Payer: Self-pay | Admitting: Physician Assistant

## 2024-06-17 DIAGNOSIS — F419 Anxiety disorder, unspecified: Secondary | ICD-10-CM

## 2024-06-17 DIAGNOSIS — F5102 Adjustment insomnia: Secondary | ICD-10-CM

## 2024-06-20 ENCOUNTER — Ambulatory Visit

## 2024-06-20 DIAGNOSIS — I4819 Other persistent atrial fibrillation: Secondary | ICD-10-CM | POA: Diagnosis not present

## 2024-06-20 DIAGNOSIS — I5032 Chronic diastolic (congestive) heart failure: Secondary | ICD-10-CM | POA: Diagnosis not present

## 2024-06-20 DIAGNOSIS — I517 Cardiomegaly: Secondary | ICD-10-CM | POA: Diagnosis not present

## 2024-06-20 DIAGNOSIS — Z952 Presence of prosthetic heart valve: Secondary | ICD-10-CM | POA: Insufficient documentation

## 2024-06-20 DIAGNOSIS — Q2381 Bicuspid aortic valve: Secondary | ICD-10-CM | POA: Diagnosis not present

## 2024-06-20 DIAGNOSIS — I2510 Atherosclerotic heart disease of native coronary artery without angina pectoris: Secondary | ICD-10-CM | POA: Insufficient documentation

## 2024-06-21 ENCOUNTER — Ambulatory Visit: Payer: Self-pay

## 2024-06-21 LAB — ECHOCARDIOGRAM COMPLETE
AR max vel: 1.96 cm2
AV Area VTI: 1.96 cm2
AV Area mean vel: 1.88 cm2
AV Mean grad: 8.4 mmHg
AV Peak grad: 16.5 mmHg
Ao pk vel: 2.03 m/s
Area-P 1/2: 4.35 cm2
S' Lateral: 2.9 cm

## 2024-06-27 ENCOUNTER — Telehealth: Payer: Self-pay

## 2024-06-27 NOTE — Telephone Encounter (Signed)
 PA submitted and approved via covermymeds for zolpidem  10mg . Patient called stating rx was too expensive, called and spoke with pharmacist who stated if we submitted a PA to insurance [facility]) for them to cover 30 days it would likely be cheaper. (Medicaid will cover 15 day supply without submitting PA)

## 2024-07-11 ENCOUNTER — Other Ambulatory Visit: Payer: Self-pay | Admitting: Physician Assistant

## 2024-07-11 DIAGNOSIS — Z419 Encounter for procedure for purposes other than remedying health state, unspecified: Secondary | ICD-10-CM | POA: Diagnosis not present

## 2024-08-01 ENCOUNTER — Other Ambulatory Visit: Payer: Self-pay | Admitting: Physician Assistant

## 2024-08-01 DIAGNOSIS — E785 Hyperlipidemia, unspecified: Secondary | ICD-10-CM

## 2024-08-01 DIAGNOSIS — I10 Essential (primary) hypertension: Secondary | ICD-10-CM

## 2024-08-11 DIAGNOSIS — Z419 Encounter for procedure for purposes other than remedying health state, unspecified: Secondary | ICD-10-CM | POA: Diagnosis not present

## 2024-08-15 ENCOUNTER — Ambulatory Visit: Admitting: Physician Assistant

## 2024-08-25 ENCOUNTER — Other Ambulatory Visit: Payer: Self-pay | Admitting: Physician Assistant

## 2024-08-25 DIAGNOSIS — F419 Anxiety disorder, unspecified: Secondary | ICD-10-CM

## 2024-08-25 DIAGNOSIS — F5102 Adjustment insomnia: Secondary | ICD-10-CM

## 2024-08-28 ENCOUNTER — Ambulatory Visit: Attending: Cardiology | Admitting: Cardiology

## 2024-08-28 ENCOUNTER — Encounter: Payer: Self-pay | Admitting: Cardiology

## 2024-08-28 VITALS — BP 136/84 | HR 98 | Ht 64.0 in | Wt 200.0 lb

## 2024-08-28 DIAGNOSIS — Z952 Presence of prosthetic heart valve: Secondary | ICD-10-CM | POA: Insufficient documentation

## 2024-08-28 DIAGNOSIS — D6869 Other thrombophilia: Secondary | ICD-10-CM | POA: Insufficient documentation

## 2024-08-28 DIAGNOSIS — I5022 Chronic systolic (congestive) heart failure: Secondary | ICD-10-CM | POA: Insufficient documentation

## 2024-08-28 DIAGNOSIS — I484 Atypical atrial flutter: Secondary | ICD-10-CM | POA: Insufficient documentation

## 2024-08-28 DIAGNOSIS — I4819 Other persistent atrial fibrillation: Secondary | ICD-10-CM | POA: Diagnosis not present

## 2024-08-28 NOTE — Progress Notes (Signed)
  Electrophysiology Office Note:   Date:  08/28/2024  ID:  Hipolito Robynn Lamer, DOB 12/15/1965, MRN [id number]  Primary Cardiologist: Redell Leiter, MD Primary Heart Failure: None Electrophysiologist: Jove Beyl Gladis Norton, MD      History of Present Illness:   Marcus Hicks is a 58 y.o. male with h/o atrial fibrillation, chronic systolic heart failure, coronary artery disease, cirrhosis, thoracic aortic aneurysm with bicuspid aortic valve post replacement, surgical maze, left atrial appendage occlusion seen today for routine electrophysiology followup.   Since last being seen in our clinic the patient reports doing well.  He has no chest pain or shortness of breath at rest.  He does get dyspnea with exertion, but is happy with his control.  He has had no further episodes of atrial fibrillation.  he denies chest pain, palpitations, PND, orthopnea, nausea, vomiting, dizziness, syncope, edema, weight gain, or early satiety.   Review of systems complete and found to be negative unless listed in HPI.   EP Information / Studies Reviewed:    EKG is ordered today. Personal review as below.  EKG Interpretation Date/Time:  Monday August 28 2024 14:55:50 EDT Ventricular Rate:  98 PR Interval:  164 QRS Duration:  100 QT Interval:  352 QTC Calculation: 449 R Axis:   116  Text Interpretation: Normal sinus rhythm Right axis deviation Incomplete right bundle branch block Possible Right ventricular hypertrophy When compared with ECG of 30-Sep-2023 15:28, No significant change was found Confirmed by Nakshatra Klose ([id number]) on 08/28/2024 3:08:15 PM     Risk Assessment/Calculations:    CHA2DS2-VASc Score = 4   This indicates a 4.8% annual risk of stroke. The patient's score is based upon: CHF History: 1 HTN History: 1 Diabetes History: 1 Stroke History: 0 Vascular Disease History: 1 Age Score: 0 Gender Score: 0             Physical Exam:   VS:  BP 136/84 (BP Location: Right Arm,  Patient Position: Sitting, Cuff Size: Large)   Pulse 98   Ht 5' 4 (1.626 m)   Wt 200 lb (90.7 kg)   SpO2 96%   BMI 34.33 kg/m    Wt Readings from Last 3 Encounters:  08/28/24 200 lb (90.7 kg)  05/30/24 207 lb 9.6 oz (94.2 kg)  04/12/24 212 lb 6.4 oz (96.3 kg)     GEN: Well nourished, well developed in no acute distress NECK: No JVD; No carotid bruits CARDIAC: Regular rate and rhythm, no murmurs, rubs, gallops RESPIRATORY:  Clear to auscultation without rales, wheezing or rhonchi  ABDOMEN: Soft, non-tender, non-distended EXTREMITIES:  No edema; No deformity   ASSESSMENT AND PLAN:    1.  Persistent atrial fibrillation/flutter: Post ablation 02/18/2023.  He has had no further episodes of atrial fibrillation.  Happy with his control.  No changes.  2.  Bicuspid aortic valve: Post AVR.  Stable on echo.  Plan per primary cardiology.  3.  Chronic systolic and diastolic heart failure: Ejection fraction has normalized.  Plan per primary cardiology.  4.  Second hypercoagulable state: Post left atrial appendage clip  Follow up with Dr. Norton in 12 months  Signed, Mayra Jolliffe Gladis Norton, MD

## 2024-08-30 ENCOUNTER — Ambulatory Visit: Admitting: Physician Assistant

## 2024-08-30 ENCOUNTER — Encounter: Payer: Self-pay | Admitting: Physician Assistant

## 2024-08-30 ENCOUNTER — Telehealth: Payer: Self-pay | Admitting: Physician Assistant

## 2024-08-30 VITALS — BP 110/62 | HR 95 | Temp 97.8°F | Ht 64.0 in | Wt 204.4 lb

## 2024-08-30 DIAGNOSIS — E1165 Type 2 diabetes mellitus with hyperglycemia: Secondary | ICD-10-CM | POA: Diagnosis not present

## 2024-08-30 DIAGNOSIS — R300 Dysuria: Secondary | ICD-10-CM

## 2024-08-30 DIAGNOSIS — K7460 Unspecified cirrhosis of liver: Secondary | ICD-10-CM | POA: Diagnosis not present

## 2024-08-30 DIAGNOSIS — E782 Mixed hyperlipidemia: Secondary | ICD-10-CM | POA: Diagnosis not present

## 2024-08-30 DIAGNOSIS — Z7985 Long-term (current) use of injectable non-insulin antidiabetic drugs: Secondary | ICD-10-CM

## 2024-08-30 DIAGNOSIS — Z23 Encounter for immunization: Secondary | ICD-10-CM

## 2024-08-30 DIAGNOSIS — N3001 Acute cystitis with hematuria: Secondary | ICD-10-CM

## 2024-08-30 DIAGNOSIS — Z7984 Long term (current) use of oral hypoglycemic drugs: Secondary | ICD-10-CM | POA: Diagnosis not present

## 2024-08-30 DIAGNOSIS — R2233 Localized swelling, mass and lump, upper limb, bilateral: Secondary | ICD-10-CM

## 2024-08-30 DIAGNOSIS — I10 Essential (primary) hypertension: Secondary | ICD-10-CM

## 2024-08-30 LAB — POCT URINALYSIS DIP (CLINITEK)
Bilirubin, UA: NEGATIVE
Glucose, UA: 250 mg/dL — AB
Ketones, POC UA: NEGATIVE mg/dL
Nitrite, UA: NEGATIVE
Spec Grav, UA: 1.015 (ref 1.010–1.025)
Urobilinogen, UA: NEGATIVE U/dL — AB
pH, UA: 6 (ref 5.0–8.0)

## 2024-08-30 MED ORDER — GLIPIZIDE 5 MG PO TABS
5.0000 mg | ORAL_TABLET | Freq: Two times a day (BID) | ORAL | 3 refills | Status: AC
Start: 2024-08-30 — End: ?

## 2024-08-30 NOTE — Assessment & Plan Note (Addendum)
 Type 2 diabetes mellitus with hyperglycemia Poor glycemic control with A1c of 9.2 in May. Concerns about Jardiance  side effects. Discussed switching to glipizide and Mounjaro for better control and weight management. - Discontinue Jardiance . - Initiate glipizide 5 mg twice daily, option to start once daily. - Plan transition from Trulicity  to Mounjaro after glipizide response. - Order blood work for current A1c and baseline. - Follow up in a few months to evaluate glipizide response and initiate Mounjaro transition. Lab Results  Component Value Date   HGBA1C 8.7 (H) 08/30/2024   HGBA1C 9.2 (H) 04/12/2024   HGBA1C 8.7 (H) 12/09/2023    Orders:   Hemoglobin A1c   glipiZIDE (GLUCOTROL) 5 MG tablet; Take 1 tablet (5 mg total) by mouth 2 (two) times daily before a meal.

## 2024-08-30 NOTE — Progress Notes (Signed)
 Subjective:  Patient ID: Marcus Hicks, male    DOB: [birth year]  Age: 58 y.o. MRN: [id number]  Chief Complaint  Patient presents with   Medical Management of Chronic Issues    HPI:  Discussed the use of AI scribe software for clinical note transcription with the patient, who gave verbal consent to proceed.  History of Present Illness Marcus Hicks is a 58 year old male with liver cirrhosis who presents with worsening urinary infections.  He has been experiencing urinary issues for the past month, characterized by a burning sensation during urination. No abnormal discharge, testicular pain, side pain, or blood in the urine. He suspects that Jardiance , which he started recently, might be contributing to these symptoms.  He has a history of liver cirrhosis and is trying to re-establish care with a liver specialist. He has not seen a liver doctor in four to five years and has an upcoming appointment with a GI doctor in December to discuss his liver issues. He previously took metformin  but is cautious due to his liver condition.  He is currently managing his diabetes with Trulicity  and Jardiance . He has previously used pioglitazone  but discontinued it due to concerns about side effects. He is cautious with medications due to a past adverse reaction to amiodarone , which caused his legs to turn purple.  He has a history of heart valve replacement and COPD. He underwent heart surgery a year after his wife's passing in 2021, describing the period following her death and his surgery as challenging.  He reports a weight of around 200 pounds, down from 212-210 pounds, and aims to reach 185-190 pounds. He quit smoking in 2007 and does not consume alcohol or sodas. He occasionally drinks sugary drinks like Hawaiian Punch but primarily drinks water, coffee, and Crystal Light. He acknowledges stress eating and aims to improve his diet.  He mentions bilateral underarm swelling, which he  describes as feeling like 'a balloon with fluid in it.' This has been present for two years and causes him concern. He has not had any imaging done for this issue.        08/30/2024    3:18 PM 04/12/2024    3:03 PM 12/09/2023    2:30 PM 08/09/2023    1:53 PM 04/08/2023    1:41 PM  Depression screen PHQ 2/9  Decreased Interest 0 0 0 0 1  Down, Depressed, Hopeless 0 0 1 0 0  PHQ - 2 Score 0 0 1 0 1  Altered sleeping   2 0 2  Tired, decreased energy   3 0 1  Change in appetite   2 0 2  Feeling bad or failure about yourself    0 0 0  Trouble concentrating   0 0 1  Moving slowly or fidgety/restless   0 0 1  Suicidal thoughts   0 0 0  PHQ-9 Score   8 0 8  Difficult doing work/chores   Not difficult at all Not difficult at all Very difficult        08/30/2024    3:18 PM  Fall Risk   Falls in the past year? 0  Number falls in past yr: 0  Injury with Fall? 0  Risk for fall due to : No Fall Risks  Follow up Falls evaluation completed    Patient Care Team: Milon Cleaves, [place] as PCP - General (Physician Assistant) Inocencio Soyla Lunger, MD as PCP - Electrophysiology (Cardiology) Monetta Redell PARAS, MD as PCP -  Cardiology (Cardiology)   Review of Systems  Constitutional:  Negative for appetite change, fatigue and fever.  HENT:  Negative for congestion, ear pain, sinus pressure and sore throat.   Respiratory:  Negative for cough, chest tightness, shortness of breath and wheezing.   Cardiovascular:  Negative for chest pain and palpitations.  Gastrointestinal:  Negative for abdominal pain, constipation, diarrhea, nausea and vomiting.  Genitourinary:  Negative for dysuria and hematuria.  Musculoskeletal:  Negative for arthralgias, back pain, joint swelling and myalgias.  Skin:  Negative for rash.  Neurological:  Negative for dizziness, weakness and headaches.  Psychiatric/Behavioral:  Negative for dysphoric mood. The patient is not nervous/anxious.     Current Outpatient Medications on File  Prior to Visit  Medication Sig Dispense Refill   acetaminophen  (TYLENOL ) 500 MG tablet Take 500 mg by mouth every 6 (six) hours as needed for mild pain or fever.     albuterol  (PROVENTIL ) (2.5 MG/3ML) 0.083% nebulizer solution Take 3 mLs (2.5 mg total) by nebulization every 6 (six) hours as needed for wheezing.     albuterol  (VENTOLIN  HFA) 108 (90 Base) MCG/ACT inhaler Inhale into the lungs every 6 (six) hours as needed for wheezing or shortness of breath.     ALPRAZolam  (XANAX ) 0.5 MG tablet TAKE 1 TABLET BY MOUTH EVERY DAY AS NEEDED 30 tablet 1   ANORO ELLIPTA  62.5-25 MCG/ACT AEPB INHALE 1 PUFF INTO THE LUNGS DAILY 60 each 0   Blood Glucose Monitoring Suppl DEVI 1 each by Does not apply route in the morning, at noon, and at bedtime. May substitute to any manufacturer covered by patient's insurance. 1 each 0   furosemide  (LASIX ) 20 MG tablet TAKE 1 TABLET(20 MG) BY MOUTH DAILY 90 tablet 2   Insulin  Pen Needle (BD PEN NEEDLE NANO 2ND GEN) 32G X 4 MM MISC USE ONCE DAILY 100 each 3   JARDIANCE  25 MG TABS tablet TAKE 1 TABLET(25 MG) BY MOUTH DAILY 90 tablet 0   losartan  (COZAAR ) 50 MG tablet TAKE 1 TABLET BY MOUTH EVERY DAY 30 tablet 2   metoprolol  tartrate (LOPRESSOR ) 25 MG tablet TAKE 1 TABLET(25 MG) BY MOUTH TWICE DAILY 180 tablet 0   Microlet Lancets MISC 1 each by Does not apply route daily in the afternoon. 100 each 2   potassium chloride  SA (KLOR-CON  M) 20 MEQ tablet TAKE 1 TABLET(20 MEQ) BY MOUTH TWICE DAILY 180 tablet 1   rosuvastatin  (CRESTOR ) 40 MG tablet TAKE 1 TABLET(40 MG) BY MOUTH DAILY 90 tablet 0   triamcinolone  cream (KENALOG ) 0.1 % Apply 1 Application topically 2 (two) times daily. (Patient taking differently: Apply 1 Application topically 2 (two) times daily. As needed) 454 g 0   TRULICITY  4.5 MG/0.5ML SOAJ ADMINISTER 4.5 MG UNDER THE SKIN 1 TIME A WEEK AS DIRECTED 6 mL 1   zolpidem  (AMBIEN ) 10 MG tablet TAKE 1 TABLET(10 MG) BY MOUTH AT BEDTIME AS NEEDED FOR SLEEP 30 tablet 1    No current facility-administered medications on file prior to visit.   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  Advised to get better sleep, walk on a regular basis, decrease weight down to about 210 and maintain, and eliminate tobacco.   Excessive dental attrition 09/22/2021   HFrEF (heart failure with reduced ejection fraction) (HCC) 08/22/2020   History of maze procedure 03/31/2022   Hyperactive gag reflex 09/22/2021   Hyperlipidemia 10/12/2016   Impacted third molar tooth 09/22/2021   Incipient enamel caries 09/22/2021   Long term (current) use of anticoagulants 09/22/2021   Long-term insulin  use (HCC) 10/12/2016   Morbid obesity (HCC) 10/12/2016   Patient advised to lose down to about 210 pounds and maintained.  Regular walking program recommended.  He knows how to lose weight.   Obesity, unspecified 10/12/2016   Patient advised to lose down to about 210 pounds and maintained.  Regular  walking program recommended.  He knows how to lose weight.   Phobia of dental procedure 09/22/2021   Routine general medical examination at a health care facility 01/28/2021   S/P AVR (aortic valve replacement) and aortoplasty 10/28/2021   Sleep apnea    Teeth missing 09/22/2021   Thoracic aortic aneurysm without rupture, unspecified part 03/31/2022   Type 2 diabetes mellitus with diabetic polyneuropathy (HCC) 10/12/2016   Past Surgical History:  Procedure Laterality Date   AORTIC ROOT ENLARGEMENT  10/28/2021   Procedure: AORTIC ROOT ENLARGEMENT WITH HEMASHIELD PLATINUM X 30CM;  Surgeon: Shyrl Linnie KIDD, MD;  Location: [facility] OR;  Service: Open Heart Surgery;;   AORTIC VALVE REPLACEMENT N/A 10/28/2021   Procedure: AORTIC VALVE REPLACEMENT (AVR) WITH INSPIRIS RESILIA AORTIC VALVE SIZE ;  Surgeon: Shyrl Linnie KIDD, MD;  Location: [facility] OR;  Service: Open Heart Surgery;  Laterality: N/A;   ATRIAL FIBRILLATION ABLATION N/A 02/18/2023   Procedure: ATRIAL FIBRILLATION ABLATION;  Surgeon: Inocencio Soyla Lunger, MD;  Location: [facility] INVASIVE CV LAB;  Service: Cardiovascular;  Laterality: N/A;   CARDIAC CATHETERIZATION     CARDIOVERSION N/A 09/24/2020   Procedure: CARDIOVERSION;  Surgeon: Kate Lonni CROME, MD;  Location: [facility] ENDOSCOPY;  Service: Cardiovascular;  Laterality: N/A;   CARDIOVERSION N/A 08/13/2021   Procedure: CARDIOVERSION;  Surgeon: Loni Soyla LABOR, MD;  Location: [facility] ENDOSCOPY;  Service: Cardiovascular;  Laterality: N/A;   CARDIOVERSION N/A 02/26/2022   Procedure: CARDIOVERSION;  Surgeon: Sheena Pugh, DO;  Location: [facility] ENDOSCOPY;  Service: Cardiovascular;  Laterality: N/A;   CCTA  08/2019   CLIPPING OF ATRIAL APPENDAGE N/A 10/28/2021   Procedure: CLIPPING OF ATRIAL APPENDAGE WITH ATRICLIP ACHV45;  Surgeon: Shyrl Linnie KIDD, MD;  Location: [facility] OR;  Service: Open Heart Surgery;  Laterality: N/A;   MAZE N/A 10/28/2021   Procedure: MAZE;  Surgeon: Shyrl Linnie KIDD, MD;   Location: [facility] OR;  Service: Open Heart Surgery;  Laterality: N/A;   RIGHT/LEFT HEART CATH AND CORONARY ANGIOGRAPHY N/A 09/26/2021   Procedure: RIGHT/LEFT HEART CATH AND CORONARY ANGIOGRAPHY;  Surgeon: [place]az[place], Peter M, MD;  Location: [facility] INVASIVE CV LAB;  Service: Cardiovascular;  Laterality: N/A;  TEE WITHOUT CARDIOVERSION  09/24/2020   Procedure: TRANSESOPHAGEAL ECHOCARDIOGRAM (TEE);  Surgeon: Kate Lonni CROME, MD;  Location: [facility] ENDOSCOPY;  Service: Cardiovascular;;   TEE WITHOUT CARDIOVERSION N/A 10/28/2021   Procedure: TRANSESOPHAGEAL ECHOCARDIOGRAM (TEE);  Surgeon: Shyrl Linnie KIDD, MD;  Location: [facility] OR;  Service: Open Heart Surgery;  Laterality: N/A;    Family History  Problem Relation Age of Onset   Other Mother        pre-diabetes   Other Father        MVA   Liver disease Sister    Hypertension Sister    Alzheimer's disease Maternal Grandmother    Hypertension Maternal Grandmother    Lung cancer Maternal Grandfather    Esophageal cancer Neg Hx    Social History   Socioeconomic History   Marital status: Widowed    Spouse name: Not on file   Number of children: 3   Years of education: Not on file   Highest education level: Not on file  Occupational History   Occupation: disabled-private type  Tobacco Use   Smoking status: Former    Passive exposure: Past   Smokeless tobacco: Former    Types: Snuff   Tobacco comments:    Former snuff 08/11/2021  Vaping Use   Vaping status: Never Used  Substance and Sexual Activity   Alcohol use: Not Currently   Drug use: Never   Sexual activity: Never  Other Topics Concern   Not on file  Social History Narrative   Not on file   Social Drivers of Health   Financial Resource Strain: Low Risk  (08/09/2023)   Overall Financial Resource Strain (CARDIA)    Difficulty of Paying Living Expenses: Not hard at all  Food Insecurity: No Food Insecurity (08/09/2023)   Hunger Vital Sign    Worried About Running Out of Food in the  Last Year: Never true    Ran Out of Food in the Last Year: Never true  Transportation Needs: No Transportation Needs (08/09/2023)   PRAPARE - Administrator, Civil Service (Medical): No    Lack of Transportation (Non-Medical): No  Physical Activity: Insufficiently Active (08/09/2023)   Exercise Vital Sign    Days of Exercise per Week: 4 days    Minutes of Exercise per Session: 30 min  Stress: No Stress Concern Present (08/09/2023)   [employer] of Occupational Health - Occupational Stress Questionnaire    Feeling of Stress : Not at all  Social Connections: Socially Isolated (08/09/2023)   Social Connection and Isolation Panel    Frequency of Communication with Friends and Family: More than three times a week    Frequency of Social Gatherings with Friends and Family: Three times a week    Attends Religious Services: Never    Active Member of Clubs or Organizations: No    Attends Banker Meetings: Never    Marital Status: Widowed    Objective:  BP 110/62 (BP Location: Right Arm, Patient Position: Sitting)   Pulse 95   Temp 97.8 F (36.6 C) (Temporal)   Ht 5' 4 (1.626 m)   Wt 204 lb 6.4 oz (92.7 kg)   SpO2 94%   BMI 35.09 kg/m      08/30/2024    3:25 PM 08/28/2024    3:00 PM 05/30/2024    2:28 PM  BP/Weight  Systolic BP 110 136 105  Diastolic BP 62 84 71  Wt. (Lbs) 204.4 200 207.6  BMI 35.09 kg/m2 34.33 kg/m2 35.63  kg/m2    Physical Exam Vitals reviewed.  Constitutional:      Appearance: Normal appearance.  Neck:     Vascular: No carotid bruit.  Cardiovascular:     Rate and Rhythm: Normal rate and regular rhythm.     Heart sounds: Normal heart sounds.  Pulmonary:     Effort: Pulmonary effort is normal.     Breath sounds: Normal breath sounds.  Abdominal:     General: Bowel sounds are normal.     Palpations: Abdomen is soft.     Tenderness: There is no abdominal tenderness.  Neurological:     Mental Status: He is alert and oriented to  person, place, and time.  Psychiatric:        Mood and Affect: Mood normal.        Behavior: Behavior normal.         Lab Results  Component Value Date   WBC 6.1 04/12/2024   HGB 17.2 04/12/2024   HCT 51.8 (H) 04/12/2024   PLT 177 04/12/2024   GLUCOSE 142 (H) 04/12/2024   CHOL 156 04/12/2024   TRIG 205 (H) 04/12/2024   HDL 37 (L) 04/12/2024   LDLCALC 84 04/12/2024   ALT 50 (H) 04/12/2024   AST 37 04/12/2024   NA 143 04/12/2024   K 4.7 04/12/2024   CL 98 04/12/2024   CREATININE 0.93 04/12/2024   BUN 15 04/12/2024   CO2 24 04/12/2024   TSH 1.150 12/09/2023   INR 1.1 (H) 03/24/2024   HGBA1C 9.2 (H) 04/12/2024    Results for orders placed or performed in visit on 06/20/24  ECHOCARDIOGRAM COMPLETE   Collection Time: 06/20/24  4:58 PM  Result Value Ref Range   S' Lateral 2.90 cm   Area-P 1/2 4.35 cm2   AV Area mean vel 1.88 cm2   AV Area VTI 1.96 cm2   AR max vel 1.96 cm2   AV Mean grad 8.4 mmHg   Ao pk vel 2.03 m/s   AV Peak grad 16.5 mmHg   Est EF 55 - 60%   .  Assessment & Plan:   Assessment & Plan Type 2 diabetes mellitus with hyperglycemia, without long-term current use of insulin  (HCC) Type 2 diabetes mellitus with hyperglycemia Poor glycemic control with A1c of 9.2 in May. Concerns about Jardiance  side effects. Discussed switching to glipizide and Mounjaro for better control and weight management. - Discontinue Jardiance . - Initiate glipizide 5 mg twice daily, option to start once daily. - Plan transition from Trulicity  to Mounjaro after glipizide response. - Order blood work for current A1c and baseline. - Follow up in a few months to evaluate glipizide response and initiate Mounjaro transition. Lab Results  Component Value Date   HGBA1C 8.7 (H) 08/30/2024   HGBA1C 9.2 (H) 04/12/2024   HGBA1C 8.7 (H) 12/09/2023    Orders:   Hemoglobin A1c   glipiZIDE (GLUCOTROL) 5 MG tablet; Take 1 tablet (5 mg total) by mouth 2 (two) times daily before a  meal.  Essential hypertension Controlled Continue to monitor for any new or worsening symptoms Continue taking Metoprolol  tartrate 25mg  BP Readings from Last 3 Encounters:  08/30/24 110/62  08/28/24 136/84  05/30/24 105/71    Orders:   CBC with Differential/Platelet   Comprehensive metabolic panel with GFR  Mixed hyperlipidemia Controlled Continue to monitor diet and exercise Continue taking Crestor  40mg  as prescribed Lab Results  Component Value Date   LDLCALC 88 08/30/2024    Orders:   Lipid panel  Dysuria Urinary tract symptoms Intermittent dysuria without discharge, testicular pain, or hematuria. Differential includes UTI, yeast infection, or nephrolithiasis. Jardiance  may increase risk of UTIs and yeast infections. - Obtain urine sample for urinalysis and culture. Orders:   POCT URINALYSIS DIP (CLINITEK)  Acute cystitis with hematuria Urinary tract symptoms Intermittent dysuria without discharge, testicular pain, or hematuria. Differential includes UTI, yeast infection, or nephrolithiasis. Jardiance  may increase risk of UTIs and yeast infections. - Obtain urine sample for urinalysis and culture. Orders:   Urine Culture  Encounter for immunization  Orders:   Flu vaccine, recombinant, trivalent, inj  Cirrhosis of liver without ascites, unspecified hepatic cirrhosis type (HCC) Liver cirrhosis Well-managed liver function tests. Seeking to re-establish care with a liver specialist. Previous referral attempts unsuccessful. - Follow up with GI doctor in December and discuss liver specialist referral.    Axillary mass, bilateral Bilateral axillary masses Bilateral axillary masses present for two years, fluid-filled and cold to touch. He desires further evaluation. - Order ultrasound of bilateral axillary masses.      Body mass index is 35.09 kg/m.     No orders of the defined types were placed in this encounter.   No orders of the defined types were  placed in this encounter.      Follow-up: No follow-ups on file.  An After Visit Summary was printed and given to the patient.  Nola Angles, [place] [facility] [phone]

## 2024-08-30 NOTE — Assessment & Plan Note (Addendum)
 Controlled Continue to monitor diet and exercise Continue taking Crestor  40mg  as prescribed Lab Results  Component Value Date   LDLCALC 88 08/30/2024    Orders:   Lipid panel

## 2024-08-30 NOTE — Telephone Encounter (Signed)
 Patient calling to scheduled 6 mon US 

## 2024-08-30 NOTE — Assessment & Plan Note (Addendum)
 Controlled Continue to monitor for any new or worsening symptoms Continue taking Metoprolol  tartrate 25mg  BP Readings from Last 3 Encounters:  08/30/24 110/62  08/28/24 136/84  05/30/24 105/71    Orders:   CBC with Differential/Platelet   Comprehensive metabolic panel with GFR

## 2024-08-31 LAB — COMPREHENSIVE METABOLIC PANEL WITH GFR
ALT: 38 IU/L (ref 0–44)
AST: 28 IU/L (ref 0–40)
Albumin: 5.3 g/dL — ABNORMAL HIGH (ref 3.8–4.9)
Alkaline Phosphatase: 72 IU/L (ref 47–123)
BUN/Creatinine Ratio: 15 (ref 9–20)
BUN: 14 mg/dL (ref 6–24)
Bilirubin Total: 0.5 mg/dL (ref 0.0–1.2)
CO2: 26 mmol/L (ref 20–29)
Calcium: 10.3 mg/dL — ABNORMAL HIGH (ref 8.7–10.2)
Chloride: 97 mmol/L (ref 96–106)
Creatinine, Ser: 0.96 mg/dL (ref 0.76–1.27)
Globulin, Total: 2.8 g/dL (ref 1.5–4.5)
Glucose: 152 mg/dL — ABNORMAL HIGH (ref 70–99)
Potassium: 4.6 mmol/L (ref 3.5–5.2)
Sodium: 142 mmol/L (ref 134–144)
Total Protein: 8.1 g/dL (ref 6.0–8.5)
eGFR: 91 mL/min/1.73 (ref >59)

## 2024-08-31 LAB — CBC WITH DIFFERENTIAL/PLATELET
Basophils Absolute: 0.1 x10E3/uL (ref 0.0–0.2)
Basos: 1 %
EOS (ABSOLUTE): 0.1 x10E3/uL (ref 0.0–0.4)
Eos: 1 %
Hematocrit: 51.5 % — ABNORMAL HIGH (ref 37.5–51.0)
Hemoglobin: 17.2 g/dL (ref 13.0–17.7)
Immature Grans (Abs): 0 x10E3/uL (ref 0.0–0.1)
Immature Granulocytes: 0 %
Lymphocytes Absolute: 1.5 x10E3/uL (ref 0.7–3.1)
Lymphs: 19 %
MCH: 30.8 pg (ref 26.6–33.0)
MCHC: 33.4 g/dL (ref 31.5–35.7)
MCV: 92 fL (ref 79–97)
Monocytes Absolute: 0.5 x10E3/uL (ref 0.1–0.9)
Monocytes: 7 %
Neutrophils Absolute: 5.7 x10E3/uL (ref 1.4–7.0)
Neutrophils: 72 %
Platelets: 230 x10E3/uL (ref 150–450)
RBC: 5.58 x10E6/uL (ref 4.14–5.80)
RDW: 12.7 % (ref 11.6–15.4)
WBC: 7.8 x10E3/uL (ref 3.4–10.8)

## 2024-08-31 LAB — LIPID PANEL
Chol/HDL Ratio: 3.6 ratio (ref 0.0–5.0)
Cholesterol, Total: 160 mg/dL (ref 100–199)
HDL: 45 mg/dL (ref >39)
LDL Chol Calc (NIH): 88 mg/dL (ref 0–99)
Triglycerides: 157 mg/dL — ABNORMAL HIGH (ref 0–149)
VLDL Cholesterol Cal: 27 mg/dL (ref 5–40)

## 2024-08-31 LAB — HEMOGLOBIN A1C
Est. average glucose Bld gHb Est-mCnc: 203 mg/dL
Hgb A1c MFr Bld: 8.7 % — ABNORMAL HIGH (ref 4.8–5.6)

## 2024-08-31 NOTE — Telephone Encounter (Signed)
 RUQ US  order in epic. Secure staff message sent to radiology scheduling to contact patient to schedule. Patient notified via MyChart.

## 2024-09-03 LAB — URINE CULTURE

## 2024-09-04 ENCOUNTER — Ambulatory Visit (HOSPITAL_BASED_OUTPATIENT_CLINIC_OR_DEPARTMENT_OTHER)
Admission: RE | Admit: 2024-09-04 | Discharge: 2024-09-04 | Disposition: A | Source: Ambulatory Visit | Attending: Physician Assistant | Admitting: Physician Assistant

## 2024-09-04 ENCOUNTER — Ambulatory Visit: Payer: Self-pay | Admitting: Physician Assistant

## 2024-09-04 ENCOUNTER — Other Ambulatory Visit: Payer: Self-pay | Admitting: Physician Assistant

## 2024-09-04 DIAGNOSIS — K7460 Unspecified cirrhosis of liver: Secondary | ICD-10-CM

## 2024-09-04 DIAGNOSIS — K7689 Other specified diseases of liver: Secondary | ICD-10-CM | POA: Diagnosis not present

## 2024-09-04 DIAGNOSIS — N3000 Acute cystitis without hematuria: Secondary | ICD-10-CM

## 2024-09-04 MED ORDER — AMOXICILLIN-POT CLAVULANATE 875-125 MG PO TABS: 1.0000 | ORAL_TABLET | Freq: Two times a day (BID) | ORAL | 0 refills | Status: DC

## 2024-09-05 DIAGNOSIS — R300 Dysuria: Secondary | ICD-10-CM | POA: Insufficient documentation

## 2024-09-05 DIAGNOSIS — R2233 Localized swelling, mass and lump, upper limb, bilateral: Secondary | ICD-10-CM | POA: Insufficient documentation

## 2024-09-05 DIAGNOSIS — N3001 Acute cystitis with hematuria: Secondary | ICD-10-CM | POA: Insufficient documentation

## 2024-09-05 NOTE — Assessment & Plan Note (Signed)
 Bilateral axillary masses Bilateral axillary masses present for two years, fluid-filled and cold to touch. He desires further evaluation. - Order ultrasound of bilateral axillary masses.

## 2024-09-05 NOTE — Assessment & Plan Note (Signed)
 Urinary tract symptoms Intermittent dysuria without discharge, testicular pain, or hematuria. Differential includes UTI, yeast infection, or nephrolithiasis. Jardiance  may increase risk of UTIs and yeast infections. - Obtain urine sample for urinalysis and culture. Orders:   Urine Culture

## 2024-09-05 NOTE — Assessment & Plan Note (Signed)
 Liver cirrhosis Well-managed liver function tests. Seeking to re-establish care with a liver specialist. Previous referral attempts unsuccessful. - Follow up with GI doctor in December and discuss liver specialist referral.

## 2024-09-05 NOTE — Assessment & Plan Note (Signed)
 Urinary tract symptoms Intermittent dysuria without discharge, testicular pain, or hematuria. Differential includes UTI, yeast infection, or nephrolithiasis. Jardiance  may increase risk of UTIs and yeast infections. - Obtain urine sample for urinalysis and culture. Orders:   POCT URINALYSIS DIP (CLINITEK)

## 2024-09-06 ENCOUNTER — Ambulatory Visit: Payer: Self-pay | Admitting: Physician Assistant

## 2024-09-07 ENCOUNTER — Other Ambulatory Visit: Payer: Self-pay

## 2024-09-07 ENCOUNTER — Telehealth: Payer: Self-pay

## 2024-09-07 DIAGNOSIS — R2233 Localized swelling, mass and lump, upper limb, bilateral: Secondary | ICD-10-CM

## 2024-09-07 NOTE — Telephone Encounter (Signed)
 Called patient and he prefers 90 days supply. I will call pharmacy to correct the amount.  Copied from CRM [id number]. Topic: Clinical - Prescription Issue >> Sep 07, 2024 12:14 PM Antwanette L wrote: Reason for CRM: The patient is requesting a 90-day supply of Losartan  (Cozaar ) 50 mg tablets. An order for a 30-day supply was previously submitted by Nola Angles on 09/06/24, but the patient would prefer a longer duration. Please send a new order to [facility] on [address], [place], [place] [address]

## 2024-09-08 ENCOUNTER — Ambulatory Visit (HOSPITAL_BASED_OUTPATIENT_CLINIC_OR_DEPARTMENT_OTHER)
Admission: RE | Admit: 2024-09-08 | Discharge: 2024-09-08 | Disposition: A | Discharge status: Home / Self Care | Source: Ambulatory Visit | Attending: Physician Assistant | Admitting: Physician Assistant

## 2024-09-08 DIAGNOSIS — R2233 Localized swelling, mass and lump, upper limb, bilateral: Secondary | ICD-10-CM | POA: Diagnosis not present

## 2024-09-08 DIAGNOSIS — R2231 Localized swelling, mass and lump, right upper limb: Secondary | ICD-10-CM | POA: Diagnosis not present

## 2024-09-13 ENCOUNTER — Ambulatory Visit: Payer: Self-pay | Admitting: Physician Assistant

## 2024-10-02 ENCOUNTER — Ambulatory Visit: Admitting: Cardiology

## 2024-10-10 ENCOUNTER — Telehealth: Payer: Self-pay

## 2024-10-10 NOTE — Telephone Encounter (Signed)
 Copied from CRM [id number]. Topic: Clinical - Medication Question >> Oct 10, 2024 10:29 AM Zy'onna H wrote: Reason for CRM: Patient called in to inform his PCP that he has completed his final injection:  TRULICITY  4.5 MG/0.5ML SOAJ  He is still taking the advised: Glitz 20 MG - Tablet (1x twice per day) replace Jardiance   He is also reaching out to know if he can begin Monjuro.   **PCP/PCP Team Please Advise**

## 2024-10-11 ENCOUNTER — Other Ambulatory Visit: Payer: Self-pay

## 2024-10-11 MED ORDER — TIRZEPATIDE 2.5 MG/0.5ML ~~LOC~~ SOAJ: 2.5000 mg | SUBCUTANEOUS | 2 refills | Status: DC

## 2024-10-11 NOTE — Progress Notes (Signed)
 mo

## 2024-10-12 ENCOUNTER — Telehealth: Payer: Self-pay

## 2024-10-12 NOTE — Telephone Encounter (Signed)
 Prior auth for Mounjaro 2.5 mg/mL has been approved till November 11.13.2026.

## 2024-10-26 ENCOUNTER — Other Ambulatory Visit: Payer: Self-pay | Admitting: Family Medicine

## 2024-10-26 DIAGNOSIS — F419 Anxiety disorder, unspecified: Secondary | ICD-10-CM

## 2024-10-26 DIAGNOSIS — F5102 Adjustment insomnia: Secondary | ICD-10-CM

## 2024-11-04 ENCOUNTER — Other Ambulatory Visit: Payer: Self-pay | Admitting: Physician Assistant

## 2024-11-04 ENCOUNTER — Other Ambulatory Visit: Payer: Self-pay | Admitting: Cardiology

## 2024-11-04 DIAGNOSIS — E785 Hyperlipidemia, unspecified: Secondary | ICD-10-CM

## 2024-11-07 ENCOUNTER — Other Ambulatory Visit: Payer: Self-pay | Admitting: Physician Assistant

## 2024-11-07 DIAGNOSIS — I10 Essential (primary) hypertension: Secondary | ICD-10-CM

## 2024-11-09 ENCOUNTER — Ambulatory Visit: Admitting: Gastroenterology

## 2024-11-09 ENCOUNTER — Encounter: Payer: Self-pay | Admitting: Gastroenterology

## 2024-11-09 VITALS — BP 138/68 | HR 94 | Ht 64.0 in | Wt 212.6 lb

## 2024-11-09 DIAGNOSIS — I4819 Other persistent atrial fibrillation: Secondary | ICD-10-CM

## 2024-11-09 DIAGNOSIS — Q2381 Bicuspid aortic valve: Secondary | ICD-10-CM | POA: Diagnosis not present

## 2024-11-09 DIAGNOSIS — Z1211 Encounter for screening for malignant neoplasm of colon: Secondary | ICD-10-CM

## 2024-11-09 DIAGNOSIS — K7460 Unspecified cirrhosis of liver: Secondary | ICD-10-CM

## 2024-11-09 DIAGNOSIS — E1142 Type 2 diabetes mellitus with diabetic polyneuropathy: Secondary | ICD-10-CM

## 2024-11-09 DIAGNOSIS — E119 Type 2 diabetes mellitus without complications: Secondary | ICD-10-CM | POA: Diagnosis not present

## 2024-11-09 DIAGNOSIS — Z7985 Long-term (current) use of injectable non-insulin antidiabetic drugs: Secondary | ICD-10-CM | POA: Diagnosis not present

## 2024-11-09 DIAGNOSIS — I4891 Unspecified atrial fibrillation: Secondary | ICD-10-CM | POA: Diagnosis not present

## 2024-11-09 DIAGNOSIS — I5032 Chronic diastolic (congestive) heart failure: Secondary | ICD-10-CM

## 2024-11-09 DIAGNOSIS — I10 Essential (primary) hypertension: Secondary | ICD-10-CM

## 2024-11-09 NOTE — Patient Instructions (Addendum)
 _______________________________________________________  If your blood pressure at your visit was 140/90 or greater, please contact your primary care physician to follow up on this.  _______________________________________________________  If you are age 59 or older, your body mass index should be between 23-30. Your Body mass index is 36.49 kg/m. If this is out of the aforementioned range listed, please consider follow up with your Primary Care Provider.  If you are age 34 or younger, your body mass index should be between 19-25. Your Body mass index is 36.49 kg/m. If this is out of the aformentioned range listed, please consider follow up with your Primary Care Provider.   ________________________________________________________  The [facility] providers would like to encourage you to use MYCHART to communicate with providers for non-urgent requests or questions.  Due to long hold times on the telephone, sending your provider a message by [facility] may be a faster and more efficient way to get a response.  Please allow 48 business hours for a response.  Please remember that this is for non-urgent requests.  _______________________________________________________  [facility] is using a team-based approach to care.  Your team is made up of your doctor and two to three APPS. Our APPS (Nurse Practitioners and Physician Assistants) work with your physician to ensure care continuity for you. They are fully qualified to address your health concerns and develop a treatment plan. They communicate directly with your gastroenterologist to care for you. Seeing the Advanced Practice Practitioners on your physician's team can help you by facilitating care more promptly, often allowing for earlier appointments, access to diagnostic testing, procedures, and other specialty referrals.   You have been scheduled for an endoscopy and colonoscopy. Please follow the written instructions given to you at  your visit today.  If you use inhalers (even only as needed), please bring them with you on the day of your procedure.  DO NOT TAKE 7 DAYS PRIOR TO TEST- Trulicity  (dulaglutide ) Ozempic, Wegovy (semaglutide) Mounjaro , Zepbound  (tirzepatide ) Bydureon Bcise (exanatide extended release)  DO NOT TAKE 1 DAY PRIOR TO YOUR TEST Rybelsus (semaglutide) Adlyxin (lixisenatide) Victoza  (liraglutide ) Byetta (exanatide) ___________________________________________________________________________  Due to recent changes in healthcare laws, you may see the results of your imaging and laboratory studies on MyChart before your provider has had a chance to review them.  We understand that in some cases there may be results that are confusing or concerning to you. Not all laboratory results come back in the same time frame and the provider may be waiting for multiple results in order to interpret others.  Please give us  48 hours in order for your provider to thoroughly review all the results before contacting the office for clarification of your results.   It was a pleasure to see you today!  Vito Cirigliano, D.O.

## 2024-11-09 NOTE — Progress Notes (Signed)
 Chief Complaint:    Cirrhosis  GI History: 58 year old male with a history of A-fib s/p ablation 01/2023 and left atrial appendage clip, CHF with EF 55-60%, COPD, OSA, diabetes, aortic valve replacement 2022, follows in the GI clinic for the following:  HCV cirrhosis.  History of hepatitis C treated with SVR in 2012, subsequently diagnosed with cirrhosis.  Labs in his own medical care and essentially no follow-up between 2018 and reestablishing care in 2025 as he was caring for his wife.   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  aFP - 02/2024 - Variceal screening: Scheduling EGD today - Serologic evaluation: Negative/normal in 02/2024 - Viral hepatitis vaccination: HBsAb-. Ordered HBV vaccine, and patient believes this was completed through his PCP office - Flu vaccine: UTD - Liver biopsy: None - Medications: Lasix  20 mg/day (prescribed for CHF) - MELD 3.0: 7    Endoscopic History: - 2015: Colonoscopy: Normal per patient with recommendation to repeat in 10 years  HPI:     Patient is a 58 y.o. male presenting to the Gastroenterology Clinic for follow-up.  Was initially seen by Alan Coombs, PA-C on 03/24/2024 to establish care.  Previously followed at [facility], but had not been seen since 2016.  Recommended EGD for EV screening along with colonoscopy for CRC screening.  Ordered labs for updated evaluation and HCC screening and recommended 27-month follow-up.  - 05/2024: TTE: EF 55-60% -08/2024: RUQ US : Cirrhotic appearing liver without HCC or duct dilation - 08/2024: Normal CBC.  Albumin  5.3, calcium  10.3, BG 152, otherwise normal CMP.  A1c 8.7%  Today, he would like to discuss scheduling EGD and colonoscopy.  Otherwise no active GI symptoms today.    Review of systems:     No chest pain, no SOB, no fevers, no urinary sx   Past Medical History:  Diagnosis Date   Accretions on teeth 09/22/2021    Acquired thrombophilia 08/11/2021   Acute kidney injury 06/01/2021   Atrial fibrillation with rapid ventricular response (HCC) 08/12/2020   Bicuspid aortic valve    BMI 38.0-38.9,adult 11/26/2020   Bruxism (teeth grinding) 09/22/2021   Caries 09/22/2021   CHF (congestive heart failure) (HCC)    Chronic hepatitis C virus infection (HCC) 08/03/2013   Chronic periodontitis 09/22/2021   Cirrhosis of liver without ascites, unspecified hepatic cirrhosis type (HCC) 12/25/2021   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  Advised to get better sleep, walk on a regular basis, decrease weight down to about 210 and maintain, and eliminate tobacco.   Excessive dental attrition 09/22/2021   HFrEF (heart failure with reduced ejection fraction) (HCC) 08/22/2020   History of maze procedure 03/31/2022   Hyperactive gag reflex 09/22/2021   Hyperlipidemia 10/12/2016   Impacted third molar tooth 09/22/2021   Incipient enamel caries 09/22/2021   Long term (current) use of anticoagulants 09/22/2021   Long-term insulin  use (HCC) 10/12/2016   Morbid obesity (HCC) 10/12/2016   Patient advised to lose down to about  210 pounds and maintained.  Regular walking program recommended.  He knows how to lose weight.   Obesity, unspecified 10/12/2016   Patient advised to lose down to about 210 pounds and maintained.  Regular walking program recommended.  He knows how to lose weight.   Phobia of dental procedure 09/22/2021   Routine general medical examination at a health care  facility 01/28/2021   S/P AVR (aortic valve replacement) and aortoplasty 10/28/2021   Sleep apnea    Teeth missing 09/22/2021   Thoracic aortic aneurysm without rupture, unspecified part 03/31/2022   Type 2 diabetes mellitus with diabetic polyneuropathy (HCC) 10/12/2016    Patient's surgical history, family medical history, social history, medications and allergies were all reviewed in Epic    Current Outpatient Medications  Medication Sig Dispense Refill   acetaminophen  (TYLENOL ) 500 MG tablet Take 500 mg by mouth every 6 (six) hours as needed for mild pain or fever.     albuterol  (PROVENTIL ) (2.5 MG/3ML) 0.083% nebulizer solution Take 3 mLs (2.5 mg total) by nebulization every 6 (six) hours as needed for wheezing.     albuterol  (VENTOLIN  HFA) 108 (90 Base) MCG/ACT inhaler Inhale into the lungs every 6 (six) hours as needed for wheezing or shortness of breath.     ALPRAZolam  (XANAX ) 0.5 MG tablet TAKE 1 TABLET BY MOUTH EVERY DAY AS NEEDED 30 tablet 1   ANORO ELLIPTA  62.5-25 MCG/ACT AEPB INHALE 1 PUFF INTO THE LUNGS DAILY 60 each 0   Blood Glucose Monitoring Suppl DEVI 1 each by Does not apply route in the morning, at noon, and at bedtime. May substitute to any manufacturer covered by patient's insurance. 1 each 0   furosemide  (LASIX ) 20 MG tablet Take 1 tablet (20 mg total) by mouth daily. 90 tablet 1   glipiZIDE  (GLUCOTROL ) 5 MG tablet Take 1 tablet (5 mg total) by mouth 2 (two) times daily before a meal. 180 tablet 3   Insulin  Pen Needle (BD PEN NEEDLE NANO 2ND GEN) 32G X 4 MM MISC USE ONCE DAILY 100 each 3   losartan  (COZAAR ) 50 MG tablet TAKE 1 TABLET BY MOUTH EVERY DAY 30 tablet 2   metoprolol  tartrate (LOPRESSOR ) 25 MG tablet TAKE 1 TABLET(25 MG) BY MOUTH TWICE DAILY 180 tablet 0   Microlet Lancets MISC 1 each by Does not apply route daily in the afternoon. 100 each 2   potassium chloride  SA (KLOR-CON  M) 20 MEQ tablet TAKE 1 TABLET(20 MEQ) BY MOUTH TWICE DAILY 180 tablet 1   rosuvastatin   (CRESTOR ) 40 MG tablet TAKE 1 TABLET(40 MG) BY MOUTH DAILY 90 tablet 0   tirzepatide  (MOUNJARO ) 2.5 MG/0.5ML Pen Inject 2.5 mg into the skin once a week. 2 mL 2   triamcinolone  cream (KENALOG ) 0.1 % Apply 1 Application topically 2 (two) times daily. (Patient taking differently: Apply 1 Application topically 2 (two) times daily. As needed) 454 g 0   zolpidem  (AMBIEN ) 10 MG tablet TAKE 1 TABLET(10 MG) BY MOUTH AT BEDTIME AS NEEDED FOR SLEEP 30 tablet 1   amoxicillin -clavulanate (AUGMENTIN ) 875-125 MG tablet Take 1 tablet by mouth 2 (two) times daily. 20 tablet 0   No current facility-administered medications for this visit.    Physical Exam:     BP 138/68   Pulse 94   Ht 5' 4 (1.626 m)   Wt 212 lb 9.6 oz (96.4 kg)   SpO2 96%   BMI 36.49 kg/m   GENERAL:  Pleasant male in NAD PSYCH: : Cooperative, normal affect  EENT:  conjunctiva pink, mucous membranes moist, neck supple without masses CARDIAC:  RRR, no murmur heard, no peripheral edema PULM: Normal respiratory effort, lungs CTA bilaterally, no wheezing ABDOMEN:  Nondistended, soft, nontender. No obvious masses, no hepatomegaly,  normal bowel sounds SKIN:  turgor, no lesions seen Musculoskeletal:  Normal muscle tone, normal strength NEURO: Alert and oriented x 3, no focal neurologic deficits   IMPRESSION and PLAN:    1) Cirrhosis 58 year old male with compensated cirrhosis, presumed 2/2 prior HCV infection, although has multiple risk factors for superimposed MASH cirrhosis. - EGD for esophageal varices screening - UTD on HCC screening.  Plan for MRI three-phase liver for continued hepatoma screening in 02/2025 - Patient will check to see that he has in fact had hepatitis B vaccination series through his PCP.  If that is not the case, happy to accommodate vaccination in this office - Annual flu vaccine  2) Colon cancer screening - Due for repeat colonoscopy for ongoing CRC screening - Schedule colonoscopy  3) Diabetes - Hold  Mounjaro  7 days prior to colonoscopy  4) Atrial fibrillation 5) Bicuspid aortic valve s/p AVR 6) CHFpEF 7) Hypertension 8) Hyperlipidemia - Continue regular follow-up in the Cardiology Clinic-multiple metabolic syndrome risk factors for development of MASH   RTC in 6 months or sooner prn  The indications, risks, and benefits of EGD and colonoscopy were explained to the patient in detail. Risks include but are not limited to bleeding, perforation, adverse reaction to medications, and cardiopulmonary compromise. Sequelae include but are not limited to the possibility of surgery, hospitalization, and mortality. The patient verbalized understanding and wished to proceed. All questions answered, referred to scheduler and bowel prep ordered. Further recommendations pending results of the exam.             Sandor LULLA Flatter ,DO, FACG 11/09/2024, 2:52 PM

## 2024-11-14 DIAGNOSIS — E119 Type 2 diabetes mellitus without complications: Secondary | ICD-10-CM | POA: Insufficient documentation

## 2024-11-14 NOTE — Progress Notes (Unsigned)
 Cardiology Office Note:    Date:  11/15/2024   ID:  Marcus Hicks, DOB [birth year], MRN [id number]  PCP:  Marcus Cleaves, PA  Cardiologist:  Redell Leiter, MD    Referring MD: Nicholaus Credit, PA-C    ASSESSMENT:    1. S/P AVR (aortic valve replacement) and aortoplasty   2. History of open occlusion of left atrial appendage   3. History of maze procedure   4. Paroxysmal atrial fibrillation (HCC)   5. H/O cardiac radiofrequency ablation   6. Coronary artery disease involving native coronary artery of native heart without angina pectoris   7. Mixed hyperlipidemia   8. Cirrhosis of liver without ascites, unspecified hepatic cirrhosis type (HCC)   9. Hypertensive heart disease with chronic diastolic congestive heart failure (HCC)    PLAN:    In order of problems listed above:  Stable after open heart surgery normal function of AVR he not anticoagulant with his underlying liver disease and history of left atrial appendage surgical closure maintaining sinus rhythm. Stable CAD mild no anginal discomfort on medical therapy including beta-blocker statin Continue his high intensity statin labs managed with his PCP Heart failure is nicely compensated continue his low-dose loop diuretic EF is normal he continue his ARB If needed for his liver disease he could switch beta-blockers to 1 better suited to reduce portal pressures   Next appointment: I will plan to see him in 1 year   Medication Adjustments/Labs and Tests Ordered: Current medicines are reviewed at length with the patient today.  Concerns regarding medicines are outlined above.  No orders of the defined types were placed in this encounter.  No orders of the defined types were placed in this encounter.    History of Present Illness:    Marcus Hicks is a 58 y.o. male with a hx of complex heart disease including cardiothoracic surgery November 2022 with surgical aortic valve replacement aortic root replacement surgical  Maze procedure with left atrial appendage occlusion atrial fibrillation previously on amiodarone  now maintaining sinus rhythm after EP catheter ablation in March 2024 to maintain sinus rhythm heart failure initially reduced ejection fraction subsequently normalizing mild CAD hyperlipidemia and hepatic cirrhosis  last seen 09/30/2023. Compliance with diet, lifestyle and medications: Yes  From a cardiology perspective he is doing well he is not anticoagulated as he has a left atrial surgical appendage closure device He is seeing GI and is pending upper and lower endoscopy he asked me a question about a different beta-blocker I assume the discussion here is about using propranolol as opposed to metoprolol  for portal hypertension I will send a note to his gastroenterologist that I have no problem with him changing beta-blockers if it benefits the patient's liver disease He is not having edema shortness of breath chest pain palpitation or syncope  He had an echocardiogram for warmed July of this year showing normal ejection fraction normal right ventricle normal aortic valve function and no finding of pulmonary artery hypertension. Past Medical History:  Diagnosis Date   Accretions on teeth 09/22/2021   Acquired thrombophilia 08/11/2021   Acute kidney injury 06/01/2021   Acute laryngopharyngitis 10/20/2023   Atrial fibrillation with rapid ventricular response (HCC) 08/12/2020   Bicuspid aortic valve    BMI 38.0-38.9,adult 11/26/2020   Bruxism (teeth grinding) 09/22/2021   Caries 09/22/2021   CHF (congestive heart failure) (HCC)    Chronic hepatitis C virus infection (HCC) 08/03/2013   Chronic periodontitis 09/22/2021   Cirrhosis of liver without ascites, unspecified  hepatic cirrhosis type (HCC) 12/25/2021   Clinical xerostomia 09/22/2021   COPD (chronic obstructive pulmonary disease) (HCC)    Coronary artery disease    COVID-19 virus infection 06/01/2021   Diabetes mellitus type 2, insulin   dependent (HCC)    Diastema 09/22/2021   Dyslipidemia 07/03/2015   Dysrhythmia    A Fib   Encounter for follow-up for aortic valve replacement 12/25/2021   Encounter for preoperative dental examination 09/22/2021   Essential hypertension 07/03/2015   Last Assessment & Plan:  Blood pressure is borderline high.  Advised to get better sleep, walk on a regular basis, decrease weight down to about 210 and maintain, and eliminate tobacco.   Excessive dental attrition 09/22/2021   HFrEF (heart failure with reduced ejection fraction) (HCC) 08/22/2020   History of maze procedure 03/31/2022   Hyperactive gag reflex 09/22/2021   Hypercoagulable state due to persistent atrial fibrillation (HCC) 08/11/2021   Hyperlipidemia 10/12/2016   Impacted third molar tooth 09/22/2021   Incipient enamel caries 09/22/2021   Long term (current) use of anticoagulants 09/22/2021   Long-term insulin  use (HCC) 10/12/2016   Morbid obesity (HCC) 10/12/2016   Patient advised to lose down to about 210 pounds and maintained.  Regular walking program recommended.  He knows how to lose weight.   Obesity, diabetes, and hypertension syndrome (HCC) 07/07/2022   Obesity, unspecified 10/12/2016   Patient advised to lose down to about 210 pounds and maintained.  Regular walking program recommended.  He knows how to lose weight.   Persistent atrial fibrillation (HCC) 08/12/2020   Phobia of dental procedure 09/22/2021   Prostate cancer screening 12/09/2023   Routine general medical examination at a health care facility 01/28/2021   S/P AVR (aortic valve replacement) and aortoplasty 10/28/2021   Sleep apnea    Teeth missing 09/22/2021   Thoracic aortic aneurysm without rupture, unspecified part 03/31/2022   Type 2 diabetes mellitus with diabetic polyneuropathy (HCC) 10/12/2016   Type 2 diabetes mellitus with hyperglycemia, without long-term current use of insulin  (HCC) 12/09/2023    Current Medications: Active Medications[1]     EKGs/Labs/Other Studies Reviewed:    The following studies were reviewed today:  Cardiac Studies & Procedures   ______________________________________________________________________________________________ CARDIAC CATHETERIZATION  CARDIAC CATHETERIZATION 09/26/2021  Conclusion   Prox LAD to Mid LAD lesion is 35% stenosed.   LV end diastolic pressure is moderately elevated.   Hemodynamic findings consistent with moderate pulmonary hypertension.  Nonobstructive CAD Aortic stenosis. Gradient cannot be accurately measured by pullback given Afib but there is an AV gradient. Moderately elevated LV filling pressures Moderate pulmonary HTN Cardiac index 1.9  Plan: will initiate lasix  20 mg daily. Continue other therapy. CT surgery assessment in process for AVR/Maze/LA ligation.  Findings Coronary Findings Diagnostic  Dominance: Right  Left Main Vessel was injected. Vessel is normal in caliber. Vessel is angiographically normal.  Left Anterior Descending Prox LAD to Mid LAD lesion is 35% stenosed.  Left Circumflex Vessel was injected. Vessel is large. Vessel is angiographically normal.  Right Coronary Artery Vessel was injected. Vessel is normal in caliber. The vessel exhibits minimal luminal irregularities.  Intervention  No interventions have been documented.     ECHOCARDIOGRAM  ECHOCARDIOGRAM COMPLETE 06/20/2024  Narrative ECHOCARDIOGRAM REPORT    Patient Name:   KHALIF STENDER Date of Exam: 06/20/2024 Medical Rec #:  [id number]           Height:       64.0 in Accession #:    [id number]  Weight:       207.6 lb Date of Birth:  11/11/1966           BSA:          1.988 m Patient Age:    58 years            BP:           105/71 mmHg Patient Gender: M                   HR:           86 bpm. Exam Location:  [facility]  Procedure: 2D Echo, Cardiac Doppler, Color Doppler and Strain Analysis (Both Spectral and Color Flow Doppler were utilized during  procedure).  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 CHF, COPD, Aortic Valve Disease, Arrythmias:Atrial Fibrillation; Risk Factors:Diabetes, Dyslipidemia and Hypertension. Aortic Valve: 25 mm Inspiris Resilia (TAVR) valve is present in the aortic position. Procedure Date: 10/28/2021.  Sonographer:    Charlie Jointer RDCS Referring Phys: [id number] ALEAN SAUNDERS MADIREDDY  IMPRESSIONS   1. Left ventricular ejection fraction, by estimation, is 55 to 60%. The left ventricle has normal function. The left ventricle has no regional wall motion abnormalities. There is mild left ventricular hypertrophy. Left ventricular diastolic parameters are indeterminate. The average left ventricular global longitudinal strain is -8.3 %. The global longitudinal strain is abnormal. 2. Right ventricular systolic function is low normal. The right ventricular size is normal. 3. The mitral valve is grossly normal. Trivial mitral valve regurgitation. No evidence of mitral stenosis. 4. The aortic valve is normal in structure. Aortic valve regurgitation is not visualized. No aortic stenosis is present. There is a 25 mm Inspiris Resilia (TAVR) valve present in the aortic position. Procedure Date: 10/28/2021. Aortic valve area, by VTI measures 1.96 cm. Aortic valve mean gradient measures 8.4 mmHg. Aortic valve Vmax measures 2.03 m/s. Dimensionless index 0.4 5. The inferior vena cava is normal in size with greater than 50% respiratory variability, suggesting right atrial pressure of 3  mmHg.  FINDINGS Left Ventricle: Left ventricular ejection fraction, by estimation, is 55 to 60%. The left ventricle has normal function. The left ventricle has no regional wall motion abnormalities. The average left ventricular global longitudinal strain is -8.3 %. Strain was performed and the global longitudinal strain is abnormal. The left ventricular internal cavity size was normal in size. There is mild left ventricular hypertrophy. Left ventricular diastolic parameters are indeterminate.  Right Ventricle: The right ventricular size is normal. Right vetricular wall thickness was not well visualized. Right ventricular systolic function is low normal.  Left Atrium: Left atrial size was normal in size.  Right Atrium: Right atrial size was normal in size.  Pericardium: There is no evidence of pericardial effusion.  Mitral Valve: The mitral valve is grossly normal. Trivial mitral valve regurgitation. No evidence of mitral valve stenosis.  Tricuspid Valve: The tricuspid valve is not well visualized. Tricuspid valve regurgitation is trivial. No evidence of tricuspid stenosis.  Aortic Valve: The aortic valve is normal in structure. Aortic valve regurgitation is not visualized. No aortic stenosis is present. Aortic valve mean gradient measures 8.4 mmHg.  Aortic valve peak gradient measures 16.5 mmHg. Aortic valve area, by VTI measures 1.96 cm. There is a 25 mm Inspiris Resilia (TAVR) valve present in the aortic position. Procedure Date: 10/28/2021.  Pulmonic Valve: The pulmonic valve was not well visualized. Pulmonic valve regurgitation is not visualized. No evidence of pulmonic stenosis.  Aorta: The aortic root and ascending aorta are structurally normal, with no evidence of dilitation.  Venous: The inferior vena cava is normal in size with greater than 50% respiratory variability, suggesting right atrial pressure of 3 mmHg.  IAS/Shunts: The interatrial septum was not well  visualized.   LEFT VENTRICLE PLAX 2D LVIDd:         4.40 cm   Diastology LVIDs:         2.90 cm   LV e' medial:    9.90 cm/s LV PW:         1.30 cm   LV E/e' medial:  8.8 LV IVS:        1.40 cm   LV e' lateral:   9.79 cm/s LVOT diam:     2.50 cm   LV E/e' lateral: 8.9 LV SV:         63 LV SV Index:   32        2D Longitudinal Strain LVOT Area:     4.91 cm  2D Strain GLS Avg:     -8.3 %   RIGHT VENTRICLE            IVC RV S prime:     8.38 cm/s  IVC diam: 1.60 cm TAPSE (M-mode): 1.5 cm  LEFT ATRIUM             Index        RIGHT ATRIUM           Index LA diam:        4.30 cm 2.16 cm/m   RA Area:     15.00 cm LA Vol (A2C):   34.1 ml 17.16 ml/m  RA Volume:   39.50 ml  19.87 ml/m LA Vol (A4C):   38.6 ml 19.42 ml/m LA Biplane Vol: 37.2 ml 18.72 ml/m AORTIC VALVE AV Area (Vmax):    1.96 cm AV Area (Vmean):   1.88 cm AV Area (VTI):     1.96 cm AV Vmax:           202.80 cm/s AV Vmean:          132.200 cm/s AV VTI:            0.320 m AV Peak Grad:      16.5 mmHg AV Mean Grad:      8.4 mmHg LVOT Vmax:         80.90 cm/s LVOT Vmean:        50.633 cm/s LVOT VTI:          0.128 m LVOT/AV VTI ratio: 0.40  AORTA Ao Root diam: 3.40 cm Ao Asc diam:  3.00 cm Ao Desc diam: 2.40 cm  MITRAL VALVE MV Area (PHT): 4.35 cm    SHUNTS MV Decel Time: 175 msec    Systemic VTI:  0.13 m MV E velocity: 86.75 cm/s  Systemic Diam: 2.50 cm MV A velocity: 59.20 cm/s MV E/A ratio:  1.47  Sreedhar reddy Madireddy Electronically signed by Alean reddy Madireddy Signature Date/Time: 06/21/2024/7:41:42 PM    Final   TEE  ECHO INTRAOPERATIVE TEE 10/28/2021  Interpretation Summary   Left ventricle: Normal cavity size and wall thickness.  Wall motion is abnormal.   Septum: Patent Foramen Ovale present with left to right shunt visualized by color Doppler.   Left atrium: Patent foramen ovale present with left to right shunting indicated by color flow Doppler.   Aortic valve: The valve  is bicuspid. Severe stenosis. Mild regurgitation.   Mitral valve:  Mild mitral annular calcification. Mild regurgitation.   Right ventricle:  Normal wall thickness. Mildly reduced systolic function.   Tricuspid valve: Moderate regurgitation. The tricuspid valve regurgitation jet is directed toward septum.  Report is limited as several images did not come over for post procedure review.    CT SCANS  CT CARDIAC SCORING (SELF PAY ONLY) 05/16/2021  Addendum 05/16/2021  6:35 PM ADDENDUM REPORT: 05/16/2021 18:33  CLINICAL DATA:  Risk stratification: 58 Year-old White Male  EXAM: Coronary Calcium  Score  TECHNIQUE: The patient was scanned on a [employer]. Axial non-contrast 3 mm slices were carried out through the heart. The data set was analyzed on a dedicated work station and scored using the Agatson method.  FINDINGS: Non-cardiac: See separate report from [facility].  Ascending Aorta: Normal caliber.  Aortic atherosclerosis noted.  Aortic Valve: Significant aortic valve calcification. Aortic valve calcium  score of 1141.  Pericardium: Normal.  Coronary arteries: Normal origins.  Coronary Calcium  Score:  Left main: 0  Left anterior descending artery: 53  Left circumflex artery: 0  Right coronary artery: 0  Total: 53  Percentile: 69th for age, sex, and race matched control.  IMPRESSION: 1. Coronary calcium  score of 53. This was 69th percentile for age, gender, and race matched controls.  2. Aortic atherosclerosis noted.  3. Aortic valve calcium  score of 1141, which is less suggestive of severe aortic stenosis, but recommend correlation with recent echocardiogram for complete assessment.  RECOMMENDATIONS:  The proposed cut-off value of 1,651 AU yielded a 93 % sensitivity and 75 % specificity in grading AS severity in patients with classical low-flow, low-gradient AS. Proposed different cut-off values to define severe AS for men and women as  2,065 AU and 1,274 AU, respectively. The joint European and American recommendations for the assessment of AS consider the aortic valve calcium  score as a continuum - a very high calcium  score suggests severe AS and a low calcium  score suggests severe AS is unlikely.  Donney VEAR Jarome LULLA Stephen RENETTE, et al. 2017 ESC/EACTS Guidelines for the management of valvular heart disease. Eur Heart J 2017;38:2739-91.  Coronary artery calcium  (CAC) score is a strong predictor of incident coronary heart disease (CHD) and provides predictive information beyond traditional risk factors. CAC scoring is reasonable to use in the decision to withhold, postpone, or initiate statin therapy in intermediate-risk or selected borderline-risk asymptomatic adults (age 41-75 years and LDL-C >=70 to <190 mg/dL) who do not have diabetes or established atherosclerotic cardiovascular disease (ASCVD).* In intermediate-risk (10-year ASCVD risk >=7.5% to <20%) adults or selected borderline-risk (10-year ASCVD risk >=5% to <7.5%) adults in whom a CAC score is measured for the purpose of making a treatment decision the following recommendations have been made:  If CAC = 0, it is reasonable to withhold statin therapy and reassess in 5 to 10 years, as long as higher risk conditions are absent (diabetes mellitus, family history of premature CHD in first degree relatives (males <55 years; females <65 years), cigarette smoking, LDL >=190 mg/dL or other independent risk factors).  If CAC is 1 to 99, it is reasonable to initiate statin therapy for patients ?59 years of age.  If  CAC is >=100 or >=75th percentile, it is reasonable to initiate statin therapy at any age.  Cardiology referral should be considered for patients with CAC scores =400 or >=75th percentile.  *2018 AHA/ACC/AACVPR/AAPA/ABC/ACPM/ADA/AGS/APhA/ASPC/NLA/PCNA Guideline on the Management of Blood Cholesterol: A Report of the American College of  Cardiology/American Heart Association Task Force on Clinical Practice Guidelines. J Am Coll Cardiol. 2019;73(24):3168-3209.  Stanly Leavens, MD   Electronically Signed By: Stanly Leavens MD On: 05/16/2021 18:33  Narrative EXAM: OVER-READ INTERPRETATION  CT CHEST  The following report is an over-read performed by radiologist Dr. Rockey Kilts of [facility] on 05/16/2021. This over-read does not include interpretation of cardiac or coronary anatomy or pathology. The calcium  score interpretation by the cardiologist is attached.  COMPARISON:  08/12/2020 chest radiograph from [facility] [place]. Chest CT from 11/01/2013, [facility] hospital also reviewed.  FINDINGS: Vascular: Aortic atherosclerosis. Age advanced aortic valve calcification.  Mediastinum/Nodes: No imaged thoracic adenopathy.  Lungs/Pleura: No pleural fluid. 3 mm right lower lobe pulmonary nodule on 07/03 is not readily apparent on 11/01/2013.  A left lower lobe 3 mm nodule on 13/3 is present in 2014 and considered benign.  Upper Abdomen: Advanced cirrhosis. Mild to moderate hepatic steatosis. Normal imaged portions of the stomach, spleen.  Musculoskeletal: No acute osseous abnormality.  IMPRESSION: 1.  No acute findings in the imaged extracardiac chest. 2.  Aortic Atherosclerosis (ICD10-I70.0). 3. 3 mm right lower lobe pulmonary nodule appears new compared to 2014. No follow-up needed if patient is low-risk. Non-contrast chest CT can be considered in 12 months if patient is high-risk. This recommendation follows the consensus statement: Guidelines for Management of Incidental Pulmonary Nodules Detected on CT Images: From the Fleischner Society 2017; Radiology 2017; 284:228-243. 4. Cirrhosis. 5. Aortic valvular calcifications. Consider echocardiography to evaluate for valvular dysfunction.  Electronically Signed: By: Rockey Kilts M.D. On: 05/16/2021 14:35      ______________________________________________________________________________________________          Recent Labs: 12/09/2023: TSH 1.150 08/30/2024: ALT 38; BUN 14; Creatinine, Ser 0.96; Hemoglobin 17.2; Platelets 230; Potassium 4.6; Sodium 142  Recent Lipid Panel    Component Value Date/Time   CHOL 160 08/30/2024 1618   TRIG 157 (H) 08/30/2024 1618   HDL 45 08/30/2024 1618   CHOLHDL 3.6 08/30/2024 1618   LDLCALC 88 08/30/2024 1618    Physical Exam:    VS:  BP 130/80   Pulse 80   Ht 5' 4 (1.626 m)   Wt 215 lb 12.8 oz (97.9 kg)   SpO2 98%   BMI 37.04 kg/m     Wt Readings from Last 3 Encounters:  11/15/24 215 lb 12.8 oz (97.9 kg)  11/09/24 212 lb 9.6 oz (96.4 kg)  08/30/24 204 lb 6.4 oz (92.7 kg)     GEN: He appears healthy no stigmata of liver disease well nourished, well developed in no acute distress HEENT: Normal NECK: No JVD; No carotid bruits LYMPHATICS: No lymphadenopathy CARDIAC: Expected 1 of 6 soft flow murmur across the aortic valve no aortic regurgitation RRR, no murmurs, rubs, gallops RESPIRATORY:  Clear to auscultation without rales, wheezing or rhonchi  ABDOMEN: Soft, non-tender, non-distended MUSCULOSKELETAL:  No edema; No deformity  SKIN: Warm and dry NEUROLOGIC:  Alert and oriented x 3 PSYCHIATRIC:  Normal affect    Signed, Redell Leiter, MD  11/15/2024 9:59 AM    [facility]      [1]  Current Meds  Medication Sig   acetaminophen  (TYLENOL ) 500 MG  tablet Take 500 mg by mouth every 6 (six) hours as needed for mild pain or fever.   albuterol  (PROVENTIL ) (2.5 MG/3ML) 0.083% nebulizer solution Take 3 mLs (2.5 mg total) by nebulization every 6 (six) hours as needed for wheezing.   albuterol  (VENTOLIN  HFA) 108 (90 Base) MCG/ACT inhaler Inhale into the lungs every 6 (six) hours as needed for wheezing or shortness of breath.   ALPRAZolam  (XANAX ) 0.5 MG tablet TAKE 1 TABLET BY MOUTH EVERY DAY AS NEEDED   ANORO ELLIPTA   62.5-25 MCG/ACT AEPB INHALE 1 PUFF INTO THE LUNGS DAILY   Blood Glucose Monitoring Suppl DEVI 1 each by Does not apply route in the morning, at noon, and at bedtime. May substitute to any manufacturer covered by patient's insurance.   furosemide  (LASIX ) 20 MG tablet Take 1 tablet (20 mg total) by mouth daily.   glipiZIDE  (GLUCOTROL ) 5 MG tablet Take 1 tablet (5 mg total) by mouth 2 (two) times daily before a meal.   Insulin  Pen Needle (BD PEN NEEDLE NANO 2ND GEN) 32G X 4 MM MISC USE ONCE DAILY   losartan  (COZAAR ) 50 MG tablet TAKE 1 TABLET BY MOUTH EVERY DAY   metoprolol  tartrate (LOPRESSOR ) 25 MG tablet TAKE 1 TABLET(25 MG) BY MOUTH TWICE DAILY   Microlet Lancets MISC 1 each by Does not apply route daily in the afternoon.   potassium chloride  SA (KLOR-CON  M) 20 MEQ tablet TAKE 1 TABLET(20 MEQ) BY MOUTH TWICE DAILY   rosuvastatin  (CRESTOR ) 40 MG tablet TAKE 1 TABLET(40 MG) BY MOUTH DAILY   tirzepatide  (MOUNJARO ) 2.5 MG/0.5ML Pen Inject 2.5 mg into the skin once a week.   triamcinolone  cream (KENALOG ) 0.1 % Apply 1 Application topically 2 (two) times daily. (Patient taking differently: Apply 1 Application topically 2 (two) times daily. As needed)   zolpidem  (AMBIEN ) 10 MG tablet TAKE 1 TABLET(10 MG) BY MOUTH AT BEDTIME AS NEEDED FOR SLEEP

## 2024-11-15 ENCOUNTER — Ambulatory Visit: Admitting: Cardiology

## 2024-11-15 ENCOUNTER — Encounter: Payer: Self-pay | Admitting: Cardiology

## 2024-11-15 VITALS — BP 130/80 | HR 80 | Ht 64.0 in | Wt 215.8 lb

## 2024-11-15 DIAGNOSIS — I110 Hypertensive heart disease with heart failure: Secondary | ICD-10-CM | POA: Diagnosis not present

## 2024-11-15 DIAGNOSIS — K7460 Unspecified cirrhosis of liver: Secondary | ICD-10-CM | POA: Diagnosis not present

## 2024-11-15 DIAGNOSIS — I480 Paroxysmal atrial fibrillation: Secondary | ICD-10-CM | POA: Insufficient documentation

## 2024-11-15 DIAGNOSIS — Z952 Presence of prosthetic heart valve: Secondary | ICD-10-CM | POA: Diagnosis not present

## 2024-11-15 DIAGNOSIS — I2510 Atherosclerotic heart disease of native coronary artery without angina pectoris: Secondary | ICD-10-CM | POA: Diagnosis not present

## 2024-11-15 DIAGNOSIS — E782 Mixed hyperlipidemia: Secondary | ICD-10-CM | POA: Diagnosis not present

## 2024-11-15 DIAGNOSIS — Z9889 Other specified postprocedural states: Secondary | ICD-10-CM | POA: Insufficient documentation

## 2024-11-15 DIAGNOSIS — I5032 Chronic diastolic (congestive) heart failure: Secondary | ICD-10-CM | POA: Diagnosis not present

## 2024-11-15 NOTE — Patient Instructions (Signed)

## 2024-12-04 ENCOUNTER — Encounter: Payer: Self-pay | Admitting: Physician Assistant

## 2024-12-04 ENCOUNTER — Other Ambulatory Visit: Payer: Self-pay | Admitting: Physician Assistant

## 2024-12-04 ENCOUNTER — Ambulatory Visit (INDEPENDENT_AMBULATORY_CARE_PROVIDER_SITE_OTHER): Admitting: Physician Assistant

## 2024-12-04 VITALS — BP 128/74 | HR 91 | Temp 97.8°F | Ht 64.0 in | Wt 208.8 lb

## 2024-12-04 DIAGNOSIS — Z7984 Long term (current) use of oral hypoglycemic drugs: Secondary | ICD-10-CM | POA: Diagnosis not present

## 2024-12-04 DIAGNOSIS — E782 Mixed hyperlipidemia: Secondary | ICD-10-CM | POA: Diagnosis not present

## 2024-12-04 DIAGNOSIS — E1165 Type 2 diabetes mellitus with hyperglycemia: Secondary | ICD-10-CM

## 2024-12-04 DIAGNOSIS — R300 Dysuria: Secondary | ICD-10-CM | POA: Diagnosis not present

## 2024-12-04 DIAGNOSIS — I10 Essential (primary) hypertension: Secondary | ICD-10-CM

## 2024-12-04 DIAGNOSIS — H11001 Unspecified pterygium of right eye: Secondary | ICD-10-CM

## 2024-12-04 DIAGNOSIS — K7460 Unspecified cirrhosis of liver: Secondary | ICD-10-CM | POA: Diagnosis not present

## 2024-12-04 DIAGNOSIS — M545 Low back pain, unspecified: Secondary | ICD-10-CM | POA: Diagnosis not present

## 2024-12-04 DIAGNOSIS — I4819 Other persistent atrial fibrillation: Secondary | ICD-10-CM

## 2024-12-04 LAB — POCT URINALYSIS DIP (CLINITEK)
Bilirubin, UA: NEGATIVE
Blood, UA: NEGATIVE
Glucose, UA: 100 mg/dL — AB
Ketones, POC UA: NEGATIVE mg/dL
Leukocytes, UA: NEGATIVE
Nitrite, UA: NEGATIVE
POC PROTEIN,UA: 30 — AB
Spec Grav, UA: 1.015
Urobilinogen, UA: NEGATIVE U/dL — AB
pH, UA: 6

## 2024-12-04 MED ORDER — TIRZEPATIDE 2.5 MG/0.5ML ~~LOC~~ SOAJ: 2.5000 mg | SUBCUTANEOUS | 2 refills | Status: AC

## 2024-12-04 MED ORDER — TRAMADOL HCL 50 MG PO TABS: 50.0000 mg | ORAL_TABLET | Freq: Three times a day (TID) | ORAL | 0 refills | Status: DC | PRN

## 2024-12-04 NOTE — Assessment & Plan Note (Addendum)
 Dysuria Intermittent dysuria with possible residual symptoms. - Ordered urine test to rule out UTI. Orders:   POCT URINALYSIS DIP (CLINITEK)

## 2024-12-04 NOTE — Progress Notes (Signed)
 "  Subjective:  Patient ID: Marcus Hicks, male    DOB: 1966-02-26  Age: 59 y.o. MRN: [id number]  Chief Complaint  Patient presents with   Medical Management of Chronic Issues    HPI: Discussed the use of AI scribe software for clinical note transcription with the patient, who gave verbal consent to proceed.  History of Present Illness Marcus Hicks is a 59 year old male with cirrhosis who presents for follow-up and medication management.  He is scheduled for an endoscopy and colonoscopy on January 19th, 2026. He has cirrhosis and is apprehensive about the procedures but is preparing with the help of his daughter-in-law. He is apprehensive about the procedures but is preparing with the help of his daughter-in-law.  He has a history of open heart surgery at the end of 2022 following atrial fibrillation and cardioversion attempts. He underwent a valve replacement and a maze procedure. He has experienced syncope and was advised to sit down immediately if he feels faint to avoid injury.  He experiences chronic back pain due to degenerative disc disease, particularly in the S2 region, exacerbated by physical activity such as lifting. He rates the pain between 4 to 7 out of 10, depending on activity level. He previously received steroid injections and used tramadol  for pain management.  He reports a persistent sensation in his eye, described as feeling like a foreign body or dryness, which has been present for about a year. He has not seen an eye doctor recently.  He is currently taking Mounjaro  and is interested in obtaining a 90-day supply to reduce pharmacy visits. He also uses Ambien  for sleep but is cautious about combining it with other medications like tramadol .          12/04/2024    3:26 PM 08/30/2024    3:18 PM 04/12/2024    3:03 PM 12/09/2023    2:30 PM 08/09/2023    1:53 PM  Depression screen PHQ 2/9  Decreased Interest 0 0 0 0 0  Down, Depressed, Hopeless 0 0 0 1 0   PHQ - 2 Score 0 0 0 1 0  Altered sleeping    2 0  Tired, decreased energy    3 0  Change in appetite    2 0  Feeling bad or failure about yourself     0 0  Trouble concentrating    0 0  Moving slowly or fidgety/restless    0 0  Suicidal thoughts    0 0  PHQ-9 Score    8  0   Difficult doing work/chores    Not difficult at all Not difficult at all     Data saved with a previous flowsheet row definition        12/04/2024    3:26 PM  Fall Risk   Falls in the past year? 0  Number falls in past yr: 0  Injury with Fall? 0  Risk for fall due to : No Fall Risks  Follow up Falls evaluation completed    Patient Care Team: Milon Cleaves, [place] as PCP - General (Physician Assistant) Inocencio Soyla Lunger, MD as PCP - Electrophysiology (Cardiology) Monetta Redell PARAS, MD as PCP - Cardiology (Cardiology)   Review of Systems  Constitutional:  Negative for appetite change, fatigue and fever.  HENT:  Negative for congestion, ear pain, sinus pressure and sore throat.   Respiratory:  Negative for cough, chest tightness, shortness of breath and wheezing.   Cardiovascular:  Negative for  chest pain and palpitations.  Gastrointestinal:  Negative for abdominal pain, constipation, diarrhea, nausea and vomiting.  Genitourinary:  Negative for dysuria and hematuria.  Musculoskeletal:  Negative for arthralgias, back pain, joint swelling and myalgias.  Skin:  Negative for rash.  Neurological:  Negative for dizziness, weakness and headaches.  Psychiatric/Behavioral:  Negative for dysphoric mood. The patient is not nervous/anxious.     Medications Ordered Prior to Encounter[1] Past Medical History:  Diagnosis Date   Accretions on teeth 09/22/2021   Acquired thrombophilia 08/11/2021   Acute kidney injury 06/01/2021   Acute laryngopharyngitis 10/20/2023   Atrial fibrillation with rapid ventricular response (HCC) 08/12/2020   Bicuspid aortic valve    BMI 38.0-38.9,adult 11/26/2020   Bruxism (teeth grinding)  09/22/2021   Caries 09/22/2021   CHF (congestive heart failure) (HCC)    Chronic hepatitis C virus infection (HCC) 08/03/2013   Chronic periodontitis 09/22/2021   Cirrhosis of liver without ascites, unspecified hepatic cirrhosis type (HCC) 12/25/2021   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  Advised to get better sleep, walk on a regular basis, decrease weight down to about 210 and maintain, and eliminate tobacco.   Excessive dental attrition 09/22/2021   HFrEF (heart failure with reduced ejection fraction) (HCC) 08/22/2020   History of maze procedure 03/31/2022   Hyperactive gag reflex 09/22/2021   Hypercoagulable state due to persistent atrial fibrillation (HCC) 08/11/2021   Hyperlipidemia 10/12/2016   Impacted third molar tooth 09/22/2021   Incipient enamel caries 09/22/2021   Long term (current) use of anticoagulants 09/22/2021   Long-term insulin  use (HCC) 10/12/2016   Morbid obesity (HCC) 10/12/2016   Patient advised to lose down to about 210 pounds and maintained.  Regular walking program recommended.  He knows how to lose weight.   Obesity, diabetes, and hypertension syndrome (HCC) 07/07/2022   Obesity, unspecified 10/12/2016   Patient advised to lose down to about 210 pounds and maintained.  Regular walking program recommended.  He knows how to lose weight.   Persistent atrial fibrillation (HCC) 08/12/2020   Phobia of dental procedure 09/22/2021   Prostate cancer screening 12/09/2023   Routine general medical examination  at a health care facility 01/28/2021   S/P AVR (aortic valve replacement) and aortoplasty 10/28/2021   Sleep apnea    Teeth missing 09/22/2021   Thoracic aortic aneurysm without rupture, unspecified part 03/31/2022   Type 2 diabetes mellitus with diabetic polyneuropathy (HCC) 10/12/2016   Type 2 diabetes mellitus with hyperglycemia, without long-term current use of insulin  (HCC) 12/09/2023   Past Surgical History:  Procedure Laterality Date   AORTIC ROOT ENLARGEMENT  10/28/2021   Procedure: AORTIC ROOT ENLARGEMENT WITH HEMASHIELD PLATINUM X 30CM;  Surgeon: Shyrl Linnie KIDD, MD;  Location: [facility] OR;  Service: Open Heart Surgery;;   AORTIC VALVE REPLACEMENT N/A 10/28/2021   Procedure: AORTIC VALVE REPLACEMENT (AVR) WITH INSPIRIS RESILIA AORTIC VALVE SIZE ;  Surgeon: Shyrl Linnie KIDD, MD;  Location: [facility] OR;  Service: Open Heart Surgery;  Laterality: N/A;   ATRIAL FIBRILLATION ABLATION N/A 02/18/2023  Procedure: ATRIAL FIBRILLATION ABLATION;  Surgeon: Inocencio Soyla Lunger, MD;  Location: [facility] INVASIVE CV LAB;  Service: Cardiovascular;  Laterality: N/A;   CARDIAC CATHETERIZATION     CARDIOVERSION N/A 09/24/2020   Procedure: CARDIOVERSION;  Surgeon: Kate Lonni CROME, MD;  Location: [facility] ENDOSCOPY;  Service: Cardiovascular;  Laterality: N/A;   CARDIOVERSION N/A 08/13/2021   Procedure: CARDIOVERSION;  Surgeon: Loni Soyla LABOR, MD;  Location: [facility] ENDOSCOPY;  Service: Cardiovascular;  Laterality: N/A;   CARDIOVERSION N/A 02/26/2022   Procedure: CARDIOVERSION;  Surgeon: Sheena Pugh, DO;  Location: [facility] ENDOSCOPY;  Service: Cardiovascular;  Laterality: N/A;   CCTA  08/2019   CLIPPING OF ATRIAL APPENDAGE N/A 10/28/2021   Procedure: CLIPPING OF ATRIAL APPENDAGE WITH ATRICLIP ACHV45;  Surgeon: Shyrl Linnie KIDD, MD;  Location: [facility] OR;  Service: Open Heart Surgery;  Laterality: N/A;   MAZE N/A 10/28/2021   Procedure: MAZE;  Surgeon: Shyrl Linnie KIDD, MD;  Location: [facility] OR;  Service:  Open Heart Surgery;  Laterality: N/A;   RIGHT/LEFT HEART CATH AND CORONARY ANGIOGRAPHY N/A 09/26/2021   Procedure: RIGHT/LEFT HEART CATH AND CORONARY ANGIOGRAPHY;  Surgeon: Jordan, Peter M, MD;  Location: [facility] INVASIVE CV LAB;  Service: Cardiovascular;  Laterality: N/A;   TEE WITHOUT CARDIOVERSION  09/24/2020   Procedure: TRANSESOPHAGEAL ECHOCARDIOGRAM (TEE);  Surgeon: Kate Lonni CROME, MD;  Location: [facility] ENDOSCOPY;  Service: Cardiovascular;;   TEE WITHOUT CARDIOVERSION N/A 10/28/2021   Procedure: TRANSESOPHAGEAL ECHOCARDIOGRAM (TEE);  Surgeon: Shyrl Linnie KIDD, MD;  Location: [facility] OR;  Service: Open Heart Surgery;  Laterality: N/A;    Family History  Problem Relation Age of Onset   Other Mother        pre-diabetes   Other Father        MVA   Liver disease Sister    Hypertension Sister    Alzheimer's disease Maternal Grandmother    Hypertension Maternal Grandmother    Lung cancer Maternal Grandfather    Esophageal cancer Neg Hx    Colon cancer Neg Hx    Social History   Socioeconomic History   Marital status: Widowed    Spouse name: Not on file   Number of children: 3   Years of education: Not on file   Highest education level: Not on file  Occupational History   Occupation: disabled-private type  Tobacco Use   Smoking status: Former    Passive exposure: Past   Smokeless tobacco: Former    Types: Snuff   Tobacco comments:    Former snuff 08/11/2021  Vaping Use   Vaping status: Never Used  Substance and Sexual Activity   Alcohol use: Yes    Comment: rarely   Drug use: Never   Sexual activity: Never  Other Topics Concern   Not on file  Social History Narrative   Not on file   Social Drivers of Health   Tobacco Use: Medium Risk (12/04/2024)   Patient History    Smoking Tobacco Use: Former    Smokeless Tobacco Use: Former    Passive Exposure: Past  Physicist, Medical Strain: Low Risk (08/09/2023)   Overall Financial Resource Strain (CARDIA)    Difficulty of  Paying Living Expenses: Not hard at all  Food Insecurity: No Food Insecurity (08/09/2023)   Hunger Vital Sign    Worried About Running Out of Food in the Last Year: Never true    Ran Out of Food in the Last Year: Never true  Transportation Needs: No Transportation Needs (08/09/2023)   PRAPARE - Transportation  Lack of Transportation (Medical): No    Lack of Transportation (Non-Medical): No  Physical Activity: Insufficiently Active (08/09/2023)   Exercise Vital Sign    Days of Exercise per Week: 4 days    Minutes of Exercise per Session: 30 min  Stress: No Stress Concern Present (08/09/2023)   [employer] of Occupational Health - Occupational Stress Questionnaire    Feeling of Stress : Not at all  Social Connections: Socially Isolated (08/09/2023)   Social Connection and Isolation Panel    Frequency of Communication with Friends and Family: More than three times a week    Frequency of Social Gatherings with Friends and Family: Three times a week    Attends Religious Services: Never    Active Member of Clubs or Organizations: No    Attends Banker Meetings: Never    Marital Status: Widowed  Depression (PHQ2-9): Low Risk (12/04/2024)   Depression (PHQ2-9)    PHQ-2 Score: 0  Alcohol Screen: Low Risk (08/09/2023)   Alcohol Screen    Last Alcohol Screening Score (AUDIT): 0  Housing: Low Risk (08/09/2023)   Housing    Last Housing Risk Score: 0  Utilities: Not At Risk (08/09/2023)   AHC Utilities    Threatened with loss of utilities: No  Health Literacy: Adequate Health Literacy (08/09/2023)   B1300 Health Literacy    Frequency of need for help with medical instructions: Never    Objective:  BP 128/74 (BP Location: Right Arm, Patient Position: Sitting)   Pulse 91   Temp 97.8 F (36.6 C) (Temporal)   Ht 5' 4 (1.626 m)   Wt 208 lb 12.8 oz (94.7 kg)   SpO2 97%   BMI 35.84 kg/m      12/04/2024    3:26 PM 11/15/2024    9:38 AM 11/09/2024    2:12 PM  BP/Weight  Systolic  BP 128 869 138  Diastolic BP 74 80 68  Wt. (Lbs) 208.8 215.8 212.6  BMI 35.84 kg/m2 37.04 kg/m2 36.49 kg/m2    Physical Exam Vitals reviewed.  Constitutional:      Appearance: Normal appearance.  Cardiovascular:     Rate and Rhythm: Normal rate and regular rhythm.     Heart sounds: Normal heart sounds.  Pulmonary:     Effort: Pulmonary effort is normal.     Breath sounds: Normal breath sounds.  Abdominal:     General: Bowel sounds are normal.     Palpations: Abdomen is soft.     Tenderness: There is no abdominal tenderness.  Neurological:     Mental Status: He is alert and oriented to person, place, and time.  Psychiatric:        Mood and Affect: Mood normal.        Behavior: Behavior normal.         Lab Results  Component Value Date   WBC 7.8 08/30/2024   HGB 17.2 08/30/2024   HCT 51.5 (H) 08/30/2024   PLT 230 08/30/2024   GLUCOSE 152 (H) 08/30/2024   CHOL 160 08/30/2024   TRIG 157 (H) 08/30/2024   HDL 45 08/30/2024   LDLCALC 88 08/30/2024   ALT 38 08/30/2024   AST 28 08/30/2024   NA 142 08/30/2024   K 4.6 08/30/2024   CL 97 08/30/2024   CREATININE 0.96 08/30/2024   BUN 14 08/30/2024   CO2 26 08/30/2024   TSH 1.150 12/09/2023   INR 1.1 (H) 03/24/2024   HGBA1C 8.7 (H) 08/30/2024    Results for  orders placed or performed in visit on 08/30/24  Urine Culture   Collection Time: 08/30/24  4:15 PM   Specimen: Urine   UR  Result Value Ref Range   Urine Culture, Routine Final report (A)    Organism ID, Bacteria Comment (A)    Antimicrobial Susceptibility Comment   CBC with Differential/Platelet   Collection Time: 08/30/24  4:18 PM  Result Value Ref Range   WBC 7.8 3.4 - 10.8 x10E3/uL   RBC 5.58 4.14 - 5.80 x10E6/uL   Hemoglobin 17.2 13.0 - 17.7 g/dL   Hematocrit 48.4 (H) 62.4 - 51.0 %   MCV 92 79 - 97 fL   MCH 30.8 26.6 - 33.0 pg   MCHC 33.4 31.5 - 35.7 g/dL   RDW 87.2 88.3 - 84.5 %   Platelets 230 150 - 450 x10E3/uL   Neutrophils 72 Not Estab. %    Lymphs 19 Not Estab. %   Monocytes 7 Not Estab. %   Eos 1 Not Estab. %   Basos 1 Not Estab. %   Neutrophils Absolute 5.7 1.4 - 7.0 x10E3/uL   Lymphocytes Absolute 1.5 0.7 - 3.1 x10E3/uL   Monocytes Absolute 0.5 0.1 - 0.9 x10E3/uL   EOS (ABSOLUTE) 0.1 0.0 - 0.4 x10E3/uL   Basophils Absolute 0.1 0.0 - 0.2 x10E3/uL   Immature Granulocytes 0 Not Estab. %   Immature Grans (Abs) 0.0 0.0 - 0.1 x10E3/uL  Comprehensive metabolic panel with GFR   Collection Time: 08/30/24  4:18 PM  Result Value Ref Range   Glucose 152 (H) 70 - 99 mg/dL   BUN 14 6 - 24 mg/dL   Creatinine, Ser 9.03 0.76 - 1.27 mg/dL   eGFR 91 >40 fO/fpw/8.26   BUN/Creatinine Ratio 15 9 - 20   Sodium 142 134 - 144 mmol/L   Potassium 4.6 3.5 - 5.2 mmol/L   Chloride 97 96 - 106 mmol/L   CO2 26 20 - 29 mmol/L   Calcium  10.3 (H) 8.7 - 10.2 mg/dL   Total Protein 8.1 6.0 - 8.5 g/dL   Albumin  5.3 (H) 3.8 - 4.9 g/dL   Globulin, Total 2.8 1.5 - 4.5 g/dL   Bilirubin Total 0.5 0.0 - 1.2 mg/dL   Alkaline Phosphatase 72 47 - 123 IU/L   AST 28 0 - 40 IU/L   ALT 38 0 - 44 IU/L  Hemoglobin A1c   Collection Time: 08/30/24  4:18 PM  Result Value Ref Range   Hgb A1c MFr Bld 8.7 (H) 4.8 - 5.6 %   Est. average glucose Bld gHb Est-mCnc 203 mg/dL  Lipid panel   Collection Time: 08/30/24  4:18 PM  Result Value Ref Range   Cholesterol, Total 160 100 - 199 mg/dL   Triglycerides 842 (H) 0 - 149 mg/dL   HDL 45 >60 mg/dL   VLDL Cholesterol Cal 27 5 - 40 mg/dL   LDL Chol Calc (NIH) 88 0 - 99 mg/dL   Chol/HDL Ratio 3.6 0.0 - 5.0 ratio  POCT URINALYSIS DIP (CLINITEK)   Collection Time: 08/30/24  4:23 PM  Result Value Ref Range   Color, UA     Clarity, UA     Glucose, UA =250 (A) negative mg/dL   Bilirubin, UA negative negative   Ketones, POC UA negative negative mg/dL   Spec Grav, UA 8.984 8.989 - 1.025   Blood, UA trace-intact (A) negative   pH, UA 6.0 5.0 - 8.0   POC PROTEIN,UA trace negative, trace   Urobilinogen, UA negative (  A) 0.2  or 1.0 E.U./dL   Nitrite, UA Negative Negative   Leukocytes, UA Moderate (2+) (A) Negative  .Total time spent on today's visit was 45 minutes, including both face-to-face time and nonface-to-face time personally spent on review of chart (labs and imaging), discussing labs and goals, discussing further work-up, treatment options, referrals to specialist if needed, reviewing outside records of pertinent, answering patient's questions, and coordinating care.   Assessment & Plan:   Assessment & Plan Type 2 diabetes mellitus with hyperglycemia, without long-term current use of insulin  (HCC) Type 2 diabetes mellitus Managed with Mounjaro . - Sent prescription for Mounjaro  to [facility] for a 90-day supply. Lab Results  Component Value Date   HGBA1C 10.2 (H) 12/04/2024   HGBA1C 8.7 (H) 08/30/2024   HGBA1C 9.2 (H) 04/12/2024    Orders:   Hemoglobin A1c   tirzepatide  (MOUNJARO ) 2.5 MG/0.5ML Pen; Inject 2.5 mg into the skin once a week.  Essential hypertension Controlled Denies any new or worsening symptoms Continue to monitor at home Continue to taking Metoprolol  tartrate 25mg  as prescribed BP Readings from Last 3 Encounters:  12/04/24 128/74  11/15/24 130/80  11/09/24 138/68    Orders:   Comprehensive metabolic panel with GFR  Mixed hyperlipidemia Controlled Continue to monitor diet and exercise Labs drawn today Will adjust treatment based on results Continue taking Crestor  40mg  as prescribed Lab Results  Component Value Date   LDLCALC 84 12/04/2024    Orders:   Lipid panel  Cirrhosis of liver without ascites, unspecified hepatic cirrhosis type (HCC) Cirrhosis of liver Cirrhosis with ongoing monitoring. Endoscopy and colonoscopy scheduled to assess liver status and screen for complications. - Proceed with scheduled endoscopy and colonoscopy on January 19th. Orders:   CBC with Differential/Platelet  Lumbar back pain Chronic lumbar degenerative disc disease with low  back pain Chronic lumbar degenerative disc disease with variable low back pain. Pain exacerbated by physical activity. Previous steroid injections provided relief. Surgery not currently indicated unless symptoms worsen significantly. - Prescribed tramadol  for pain management. - Will consider MRI if pain worsens or tramadol  is ineffective. - Will plan for x-ray at next visit if symptoms persist. Orders:   traMADol  (ULTRAM ) 50 MG tablet; Take 1 tablet (50 mg total) by mouth every 8 (eight) hours as needed for up to 7 days for severe pain (pain score 7-10).  Pterygium of right eye Pterygium of right eye Pterygium of the right eye causing irritation and dryness. No acute intervention required unless symptoms worsen. - Referred to ophthalmologist for evaluation and potential removal if symptoms worsen. Orders:   Ambulatory referral to Ophthalmology  Dysuria Dysuria Intermittent dysuria with possible residual symptoms. - Ordered urine test to rule out UTI. Orders:   POCT URINALYSIS DIP (CLINITEK)  Persistent atrial fibrillation (HCC) Denies any new or worsening symptoms Continue to follow up with cardiology      Body mass index is 35.84 kg/m.   No orders of the defined types were placed in this encounter.  No orders of the defined types were placed in this encounter.   Follow-up: No follow-ups on file.  An After Visit Summary was printed and given to the patient.    I,Lauren M Auman,acting as a neurosurgeon for [employer].,have documented all relevant documentation on the behalf of Nola Angles, PA,as directed by  Nola Angles, PA while in the presence of Nola Angles, [place].    Nola Angles, [place] [facility] [phone]     [1]  Current Outpatient Medications  on File Prior to Visit  Medication Sig Dispense Refill   acetaminophen  (TYLENOL ) 500 MG tablet Take 500 mg by mouth every 6 (six) hours as needed for mild pain or fever.     albuterol  (PROVENTIL ) (2.5 MG/3ML)  0.083% nebulizer solution Take 3 mLs (2.5 mg total) by nebulization every 6 (six) hours as needed for wheezing.     albuterol  (VENTOLIN  HFA) 108 (90 Base) MCG/ACT inhaler Inhale into the lungs every 6 (six) hours as needed for wheezing or shortness of breath.     ALPRAZolam  (XANAX ) 0.5 MG tablet TAKE 1 TABLET BY MOUTH EVERY DAY AS NEEDED 30 tablet 1   ANORO ELLIPTA  62.5-25 MCG/ACT AEPB INHALE 1 PUFF INTO THE LUNGS DAILY 60 each 0   Blood Glucose Monitoring Suppl DEVI 1 each by Does not apply route in the morning, at noon, and at bedtime. May substitute to any manufacturer covered by patient's insurance. 1 each 0   furosemide  (LASIX ) 20 MG tablet Take 1 tablet (20 mg total) by mouth daily. 90 tablet 1   glipiZIDE  (GLUCOTROL ) 5 MG tablet Take 1 tablet (5 mg total) by mouth 2 (two) times daily before a meal. 180 tablet 3   Insulin  Pen Needle (BD PEN NEEDLE NANO 2ND GEN) 32G X 4 MM MISC USE ONCE DAILY 100 each 3   metoprolol  tartrate (LOPRESSOR ) 25 MG tablet TAKE 1 TABLET(25 MG) BY MOUTH TWICE DAILY 180 tablet 0   Microlet Lancets MISC 1 each by Does not apply route daily in the afternoon. 100 each 2   potassium chloride  SA (KLOR-CON  M) 20 MEQ tablet TAKE 1 TABLET(20 MEQ) BY MOUTH TWICE DAILY 180 tablet 1   rosuvastatin  (CRESTOR ) 40 MG tablet TAKE 1 TABLET(40 MG) BY MOUTH DAILY 90 tablet 0   tirzepatide  (MOUNJARO ) 2.5 MG/0.5ML Pen Inject 2.5 mg into the skin once a week. 2 mL 2   triamcinolone  cream (KENALOG ) 0.1 % Apply 1 Application topically 2 (two) times daily. (Patient taking differently: Apply 1 Application topically 2 (two) times daily. As needed) 454 g 0   zolpidem  (AMBIEN ) 10 MG tablet TAKE 1 TABLET(10 MG) BY MOUTH AT BEDTIME AS NEEDED FOR SLEEP 30 tablet 1   No current facility-administered medications on file prior to visit.   "

## 2024-12-04 NOTE — Assessment & Plan Note (Addendum)
 Controlled Denies any new or worsening symptoms Continue to monitor at home Continue to taking Metoprolol  tartrate 25mg  as prescribed BP Readings from Last 3 Encounters:  12/04/24 128/74  11/15/24 130/80  11/09/24 138/68    Orders:   Comprehensive metabolic panel with GFR

## 2024-12-04 NOTE — Assessment & Plan Note (Addendum)
 Type 2 diabetes mellitus Managed with Mounjaro . - Sent prescription for Mounjaro  to [facility] for a 90-day supply. Lab Results  Component Value Date   HGBA1C 10.2 (H) 12/04/2024   HGBA1C 8.7 (H) 08/30/2024   HGBA1C 9.2 (H) 04/12/2024    Orders:   Hemoglobin A1c   tirzepatide  (MOUNJARO ) 2.5 MG/0.5ML Pen; Inject 2.5 mg into the skin once a week.

## 2024-12-04 NOTE — Assessment & Plan Note (Addendum)
 Controlled Continue to monitor diet and exercise Labs drawn today Will adjust treatment based on results Continue taking Crestor  40mg  as prescribed Lab Results  Component Value Date   LDLCALC 84 12/04/2024    Orders:   Lipid panel

## 2024-12-04 NOTE — Assessment & Plan Note (Addendum)
 Cirrhosis of liver Cirrhosis with ongoing monitoring. Endoscopy and colonoscopy scheduled to assess liver status and screen for complications. - Proceed with scheduled endoscopy and colonoscopy on January 19th. Orders:   CBC with Differential/Platelet

## 2024-12-05 ENCOUNTER — Ambulatory Visit: Payer: Self-pay | Admitting: Physician Assistant

## 2024-12-05 LAB — COMPREHENSIVE METABOLIC PANEL WITH GFR
ALT: 43 IU/L (ref 0–44)
AST: 30 IU/L (ref 0–40)
Albumin: 5.4 g/dL — ABNORMAL HIGH (ref 3.8–4.9)
Alkaline Phosphatase: 76 IU/L (ref 47–123)
BUN/Creatinine Ratio: 10 (ref 9–20)
BUN: 10 mg/dL (ref 6–24)
Bilirubin Total: 0.6 mg/dL (ref 0.0–1.2)
CO2: 26 mmol/L (ref 20–29)
Calcium: 10.1 mg/dL (ref 8.7–10.2)
Chloride: 93 mmol/L — ABNORMAL LOW (ref 96–106)
Creatinine, Ser: 1.05 mg/dL (ref 0.76–1.27)
Globulin, Total: 2.7 g/dL (ref 1.5–4.5)
Glucose: 245 mg/dL — ABNORMAL HIGH (ref 70–99)
Potassium: 4.7 mmol/L (ref 3.5–5.2)
Sodium: 136 mmol/L (ref 134–144)
Total Protein: 8.1 g/dL (ref 6.0–8.5)
eGFR: 82 mL/min/1.73

## 2024-12-05 LAB — LIPID PANEL
Chol/HDL Ratio: 3.6 ratio (ref 0.0–5.0)
Cholesterol, Total: 151 mg/dL (ref 100–199)
HDL: 42 mg/dL
LDL Chol Calc (NIH): 84 mg/dL (ref 0–99)
Triglycerides: 142 mg/dL (ref 0–149)
VLDL Cholesterol Cal: 25 mg/dL (ref 5–40)

## 2024-12-05 LAB — CBC WITH DIFFERENTIAL/PLATELET
Basophils Absolute: 0.1 x10E3/uL (ref 0.0–0.2)
Basos: 1 %
EOS (ABSOLUTE): 0.1 x10E3/uL (ref 0.0–0.4)
Eos: 3 %
Hematocrit: 49.8 % (ref 37.5–51.0)
Hemoglobin: 16.4 g/dL (ref 13.0–17.7)
Immature Grans (Abs): 0 x10E3/uL (ref 0.0–0.1)
Immature Granulocytes: 0 %
Lymphocytes Absolute: 1.1 x10E3/uL (ref 0.7–3.1)
Lymphs: 22 %
MCH: 29.8 pg (ref 26.6–33.0)
MCHC: 32.9 g/dL (ref 31.5–35.7)
MCV: 90 fL (ref 79–97)
Monocytes Absolute: 0.4 x10E3/uL (ref 0.1–0.9)
Monocytes: 8 %
Neutrophils Absolute: 3.3 x10E3/uL (ref 1.4–7.0)
Neutrophils: 66 %
Platelets: 205 x10E3/uL (ref 150–450)
RBC: 5.51 x10E6/uL (ref 4.14–5.80)
RDW: 11.9 % (ref 11.6–15.4)
WBC: 5 x10E3/uL (ref 3.4–10.8)

## 2024-12-05 LAB — HEMOGLOBIN A1C
Est. average glucose Bld gHb Est-mCnc: 246 mg/dL
Hgb A1c MFr Bld: 10.2 % — ABNORMAL HIGH (ref 4.8–5.6)

## 2024-12-06 ENCOUNTER — Other Ambulatory Visit: Payer: Self-pay | Admitting: Family Medicine

## 2024-12-08 NOTE — Assessment & Plan Note (Signed)
 Denies any new or worsening symptoms Continue to follow up with cardiology

## 2024-12-13 ENCOUNTER — Telehealth: Payer: Self-pay

## 2024-12-13 ENCOUNTER — Other Ambulatory Visit: Payer: Self-pay | Admitting: Physician Assistant

## 2024-12-13 DIAGNOSIS — E1142 Type 2 diabetes mellitus with diabetic polyneuropathy: Secondary | ICD-10-CM

## 2024-12-13 MED ORDER — LANTUS SOLOSTAR 100 UNIT/ML ~~LOC~~ SOPN: 15.0000 [IU] | PEN_INJECTOR | Freq: Every morning | SUBCUTANEOUS | 99 refills | Status: AC

## 2024-12-13 NOTE — Telephone Encounter (Signed)
 Copied from CRM [id number]. Topic: Clinical - Medication Question >> Dec 13, 2024  4:13 PM Myrick T wrote: Reason for CRM: patient called stated he had to stop all diabetic medications on 1/12 due to having am endo and colonoscopy. He wanted the provider to know he will start the insulin  glargine (LANTUS  SOLOSTAR) 100 UNIT/ML Solostar Pen after 1/19.

## 2024-12-14 NOTE — Telephone Encounter (Signed)
 I left detailed message on the voicemail to let him that we received the message.

## 2024-12-18 ENCOUNTER — Encounter: Payer: Self-pay | Admitting: Gastroenterology

## 2024-12-18 ENCOUNTER — Ambulatory Visit: Admitting: Gastroenterology

## 2024-12-18 VITALS — BP 110/63 | HR 93 | Temp 97.7°F | Resp 17 | Ht 64.0 in | Wt 208.0 lb

## 2024-12-18 DIAGNOSIS — K7460 Unspecified cirrhosis of liver: Secondary | ICD-10-CM | POA: Diagnosis not present

## 2024-12-18 DIAGNOSIS — Z1211 Encounter for screening for malignant neoplasm of colon: Secondary | ICD-10-CM

## 2024-12-18 DIAGNOSIS — D122 Benign neoplasm of ascending colon: Secondary | ICD-10-CM

## 2024-12-18 MED ORDER — SODIUM CHLORIDE 0.9 % IV SOLN: 500.0000 mL | INTRAVENOUS | Status: DC

## 2024-12-18 MED ORDER — DEXTROSE 5 % IV SOLN: INTRAVENOUS | Status: AC

## 2024-12-18 NOTE — Progress Notes (Signed)
 Sedate, gd SR, tolerated procedure well, VSS, report to RN

## 2024-12-18 NOTE — Op Note (Signed)
 [facility] Patient Name: Marcus Hicks Procedure Date: 12/18/2024 1:31 PM MRN: [id number] Endoscopist: Sandor Flatter , MD, [id number] Age: 59 Referring MD:  Date of Birth: [birth year] Gender: Male Account #: [id number] Procedure:                Upper GI endoscopy Indications:              Cirrhosis rule out esophageal varices Medicines:                Monitored Anesthesia Care Procedure:                Pre-Anesthesia Assessment:                           - Prior to the procedure, a History and Physical                            was performed, and patient medications and                            allergies were reviewed. The patient's tolerance of                            previous anesthesia was also reviewed. The risks                            and benefits of the procedure and the sedation                            options and risks were discussed with the patient.                            All questions were answered, and informed consent                            was obtained. Prior Anticoagulants: The patient has                            taken no anticoagulant or antiplatelet agents. ASA                            Grade Assessment: III - A patient with severe                            systemic disease. After reviewing the risks and                            benefits, the patient was deemed in satisfactory                            condition to undergo the procedure.                           After obtaining informed consent, the endoscope was  passed under direct vision. Throughout the                            procedure, the patient's blood pressure, pulse, and                            oxygen saturations were monitored continuously. The                            Olympus Scope [id number] was introduced through the                            mouth, and advanced to the second part of duodenum.                            The  upper GI endoscopy was accomplished without                            difficulty. The patient tolerated the procedure                            well. Scope In: Scope Out: Findings:                 The examined esophagus was normal.                           The entire examined stomach was normal.                           The examined duodenum was normal. Complications:            No immediate complications. Estimated Blood Loss:     Estimated blood loss: none. Impression:               - Normal esophagus.                           - Normal stomach.                           - Normal examined duodenum.                           - No specimens collected. Recommendation:           - Patient has a contact number available for                            emergencies. The signs and symptoms of potential                            delayed complications were discussed with the                            patient. Return to normal activities tomorrow.  Written discharge instructions were provided to the                            patient.                           - Resume previous diet.                           - Continue present medications.                           - Repeat upper endoscopy in 2 years for screening                            purposes. Sandor Flatter, MD 12/18/2024 2:19:44 PM

## 2024-12-18 NOTE — Progress Notes (Signed)
 Called to room to assist during endoscopic procedure.  Patient ID and intended procedure confirmed with present staff. Received instructions for my participation in the procedure from the performing physician.

## 2024-12-18 NOTE — Patient Instructions (Signed)
 YOU HAD AN ENDOSCOPIC PROCEDURE TODAY AT THE [facility]:   Refer to the procedure report that was given to you for any specific questions about what was found during the examination.  If the procedure report does not answer your questions, please call your gastroenterologist to clarify.  If you requested that your care partner not be given the details of your procedure findings, then the procedure report has been included in a sealed envelope for you to review at your convenience later.  YOU SHOULD EXPECT: Some feelings of bloating in the abdomen. Passage of more gas than usual.  Walking can help get rid of the air that was put into your GI tract during the procedure and reduce the bloating. If you had a lower endoscopy (such as a colonoscopy or flexible sigmoidoscopy) you may notice spotting of blood in your stool or on the toilet paper. If you underwent a bowel prep for your procedure, you may not have a normal bowel movement for a few days.  Please Note:  You might notice some irritation and congestion in your nose or some drainage.  This is from the oxygen used during your procedure.  There is no need for concern and it should clear up in a day or so.  SYMPTOMS TO REPORT IMMEDIATELY:  Following lower endoscopy (colonoscopy or flexible sigmoidoscopy):  Excessive amounts of blood in the stool  Significant tenderness or worsening of abdominal pains  Swelling of the abdomen that is new, acute  Fever of 100F or higher  Following upper endoscopy (EGD)  Vomiting of blood or coffee ground material  New chest pain or pain under the shoulder blades  Painful or persistently difficult swallowing  New shortness of breath  Fever of 100F or higher  Black, tarry-looking stools  Resume previous diet Continue present medications Await pathology results Repeat colonoscopy for surveillance based on pathology results Return to GI office as needed  Repeat upper endoscopy in 2 years Handout  on polyps given   For urgent or emergent issues, a gastroenterologist can be reached at any hour by calling [phone]. Do not use MyChart messaging for urgent concerns.    DIET:  We do recommend a small meal at first, but then you may proceed to your regular diet.  Drink plenty of fluids but you should avoid alcoholic beverages for 24 hours.  ACTIVITY:  You should plan to take it easy for the rest of today and you should NOT DRIVE or use heavy machinery until tomorrow (because of the sedation medicines used during the test).    FOLLOW UP: Our staff will call the number listed on your records the next business day following your procedure.  We will call around 7:15- 8:00 am to check on you and address any questions or concerns that you may have regarding the information given to you following your procedure. If we do not reach you, we will leave a message.     If any biopsies were taken you will be contacted by phone or by letter within the next 1-3 weeks.  Please call us  at [phone] if you have not heard about the biopsies in 3 weeks.    SIGNATURES/CONFIDENTIALITY: You and/or your care partner have signed paperwork which will be entered into your electronic medical record.  These signatures attest to the fact that that the information above on your After Visit Summary has been reviewed and is understood.  Full responsibility of the confidentiality of this discharge information lies  with you and/or your care-partner.

## 2024-12-18 NOTE — Op Note (Signed)
 [facility] Patient Name: Marcus Hicks Procedure Date: 12/18/2024 1:31 PM MRN: [id number] Endoscopist: Sandor Flatter , MD, [id number] Age: 59 Referring MD:  Date of Birth: 1966-04-12 Gender: Male Account #: [id number] Procedure:                Colonoscopy Indications:              Screening for colorectal malignant neoplasm (last                            colonoscopy was 10 years ago) Medicines:                Monitored Anesthesia Care Procedure:                Pre-Anesthesia Assessment:                           - Prior to the procedure, a History and Physical                            was performed, and patient medications and                            allergies were reviewed. The patient's tolerance of                            previous anesthesia was also reviewed. The risks                            and benefits of the procedure and the sedation                            options and risks were discussed with the patient.                            All questions were answered, and informed consent                            was obtained. Prior Anticoagulants: The patient has                            taken no anticoagulant or antiplatelet agents. ASA                            Grade Assessment: III - A patient with severe                            systemic disease. After reviewing the risks and                            benefits, the patient was deemed in satisfactory                            condition to undergo the procedure.  After obtaining informed consent, the colonoscope                            was passed under direct vision. Throughout the                            procedure, the patient's blood pressure, pulse, and                            oxygen saturations were monitored continuously. The                            Olympus Scope [id number] was introduced through the                            anus and advanced to  the the cecum, identified by                            appendiceal orifice and ileocecal valve. The                            colonoscopy was performed without difficulty. The                            patient tolerated the procedure well. Following                            copious irrigation and lavage, the quality of the                            bowel preparation was good. The ileocecal valve,                            appendiceal orifice, and rectum were photographed. Scope In: 1:51:59 PM Scope Out: 2:13:39 PM Scope Withdrawal Time: 0 hours 13 minutes 18 seconds  Total Procedure Duration: 0 hours 21 minutes 40 seconds  Findings:                 The perianal and digital rectal examinations were                            normal.                           Two sessile polyps were found in the ascending                            colon. The polyps were 3 to 4 mm in size. These                            polyps were removed with a cold snare. Resection                            and retrieval were complete. Estimated blood  loss                            was minimal.                           The exam was otherwise normal throughout the                            remainder of the colon.                           The retroflexed view of the distal rectum and anal                            verge was normal and showed no anal or rectal                            abnormalities. Complications:            No immediate complications. Estimated Blood Loss:     Estimated blood loss was minimal. Impression:               - Two 3 to 4 mm polyps in the ascending colon,                            removed with a cold snare. Resected and retrieved.                           - The distal rectum and anal verge are normal on                            retroflexion view. Recommendation:           - Patient has a contact number available for                            emergencies. The signs and  symptoms of potential                            delayed complications were discussed with the                            patient. Return to normal activities tomorrow.                            Written discharge instructions were provided to the                            patient.                           - Resume previous diet.                           - Continue present medications.                           -  Await pathology results.                           - Repeat colonoscopy for surveillance based on                            pathology results.                           - Return to GI office PRN. Sandor Flatter, MD 12/18/2024 2:22:51 PM

## 2024-12-18 NOTE — Progress Notes (Signed)
 "   GASTROENTEROLOGY PROCEDURE H&P NOTE   Primary Care Physician: Milon Cleaves, PA    Reason for Procedure:  Cirrhosis, esophageal varices screening, colon cancer screening  Plan:    EGD, colonoscopy  Patient is appropriate for endoscopic procedure(s) in the ambulatory ([facility]) setting.  The nature of the procedure, as well as the risks, benefits, and alternatives were carefully and thoroughly reviewed with the patient. Ample time for discussion and questions allowed. The patient understood, was satisfied, and agreed to proceed. I personally addressed all patient questions and concerns.     HPI: Marcus Hicks is a 59 y.o. male who presents for EGD for EV screening along with colonoscopy for routine CRC screening.  Last colonoscopy was 2015 and normal per patient.  Otherwise no GI symptoms.  Has been holding his Mounjaro  for procedures today.  Past Medical History:  Diagnosis Date   Accretions on teeth 09/22/2021   Acquired thrombophilia 08/11/2021   Acute kidney injury 06/01/2021   Acute laryngopharyngitis 10/20/2023   Atrial fibrillation with rapid ventricular response (HCC) 08/12/2020   Bicuspid aortic valve    BMI 38.0-38.9,adult 11/26/2020   Bruxism (teeth grinding) 09/22/2021   Caries 09/22/2021   CHF (congestive heart failure) (HCC)    Chronic hepatitis C virus infection (HCC) 08/03/2013   Chronic periodontitis 09/22/2021   Cirrhosis of liver without ascites, unspecified hepatic cirrhosis type (HCC) 12/25/2021   Clinical xerostomia 09/22/2021   COPD (chronic obstructive pulmonary disease) (HCC)    Coronary artery disease    COVID-19 virus infection 06/01/2021   Diabetes mellitus type 2, insulin  dependent (HCC)    Diastema 09/22/2021   Dyslipidemia 07/03/2015   Dysrhythmia    A Fib   Encounter for follow-up for aortic valve replacement 12/25/2021   Encounter for preoperative dental examination 09/22/2021   Essential hypertension 07/03/2015   Last Assessment &  Plan:  Blood pressure is borderline high.  Advised to get better sleep, walk on a regular basis, decrease weight down to about 210 and maintain, and eliminate tobacco.   Excessive dental attrition 09/22/2021   HFrEF (heart failure with reduced ejection fraction) (HCC) 08/22/2020   History of maze procedure 03/31/2022   Hyperactive gag reflex 09/22/2021   Hypercoagulable state due to persistent atrial fibrillation (HCC) 08/11/2021   Hyperlipidemia 10/12/2016   Impacted third molar tooth 09/22/2021   Incipient enamel caries 09/22/2021   Long term (current) use of anticoagulants 09/22/2021   Long-term insulin  use (HCC) 10/12/2016   Morbid obesity (HCC) 10/12/2016   Patient advised to lose down to about 210 pounds and maintained.  Regular walking program recommended.  He knows how to lose weight.   Obesity, diabetes, and hypertension syndrome (HCC) 07/07/2022   Obesity, unspecified 10/12/2016   Patient advised to lose down to about 210 pounds and maintained.  Regular walking program recommended.  He knows how to lose weight.   Persistent atrial fibrillation (HCC) 08/12/2020   Phobia of dental procedure 09/22/2021   Prostate cancer screening 12/09/2023   Routine general medical examination at a health care facility 01/28/2021   S/P AVR (aortic valve replacement) and aortoplasty 10/28/2021   Sleep apnea    Teeth missing 09/22/2021   Thoracic aortic aneurysm without rupture, unspecified part 03/31/2022   Type 2 diabetes mellitus with diabetic polyneuropathy (HCC) 10/12/2016   Type 2 diabetes mellitus with hyperglycemia, without long-term current use of insulin  (HCC) 12/09/2023    Past Surgical History:  Procedure Laterality Date   AORTIC ROOT ENLARGEMENT  10/28/2021  Procedure: AORTIC ROOT ENLARGEMENT WITH HEMASHIELD PLATINUM X 30CM;  Surgeon: Shyrl Linnie KIDD, MD;  Location: [facility] OR;  Service: Open Heart Surgery;;   AORTIC VALVE REPLACEMENT N/A 10/28/2021   Procedure: AORTIC VALVE  REPLACEMENT (AVR) WITH INSPIRIS RESILIA AORTIC VALVE SIZE ;  Surgeon: Shyrl Linnie KIDD, MD;  [facility];  Service: Open Heart Surgery;  Laterality: N/A;   ATRIAL FIBRILLATION ABLATION N/A 02/18/2023   Procedure: ATRIAL FIBRILLATION ABLATION;  Surgeon: Inocencio Soyla Lunger, MD;  Location: [facility] INVASIVE CV LAB;  Service: Cardiovascular;  Laterality: N/A;   CARDIAC CATHETERIZATION     CARDIOVERSION N/A 09/24/2020   Procedure: CARDIOVERSION;  Surgeon: Kate Lonni CROME, MD;  Location: [facility] ENDOSCOPY;  Service: Cardiovascular;  Laterality: N/A;   CARDIOVERSION N/A 08/13/2021   Procedure: CARDIOVERSION;  Surgeon: Loni Soyla LABOR, MD;  Location: [facility] ENDOSCOPY;  Service: Cardiovascular;  Laterality: N/A;   CARDIOVERSION N/A 02/26/2022   Procedure: CARDIOVERSION;  Surgeon: Sheena Pugh, DO;  Location: [facility] ENDOSCOPY;  Service: Cardiovascular;  Laterality: N/A;   CCTA  08/2019   CLIPPING OF ATRIAL APPENDAGE N/A 10/28/2021   Procedure: CLIPPING OF ATRIAL APPENDAGE WITH ATRICLIP ACHV45;  Surgeon: Shyrl Linnie KIDD, MD;  Location: [facility] OR;  Service: Open Heart Surgery;  Laterality: N/A;   MAZE N/A 10/28/2021   Procedure: MAZE;  Surgeon: Shyrl Linnie KIDD, MD;  Location: [facility] OR;  Service: Open Heart Surgery;  Laterality: N/A;   RIGHT/LEFT HEART CATH AND CORONARY ANGIOGRAPHY N/A 09/26/2021   Procedure: RIGHT/LEFT HEART CATH AND CORONARY ANGIOGRAPHY;  Surgeon: Jordan, Peter M, MD;  Location: [facility] INVASIVE CV LAB;  Service: Cardiovascular;  Laterality: N/A;   TEE WITHOUT CARDIOVERSION  09/24/2020   Procedure: TRANSESOPHAGEAL ECHOCARDIOGRAM (TEE);  Surgeon: Kate Lonni CROME, MD;  Location: [facility] ENDOSCOPY;  Service: Cardiovascular;;   TEE WITHOUT CARDIOVERSION N/A 10/28/2021   Procedure: TRANSESOPHAGEAL ECHOCARDIOGRAM (TEE);  Surgeon: Shyrl Linnie KIDD, MD;  Location: [facility] OR;  Service: Open Heart Surgery;  Laterality: N/A;    Prior to Admission medications  Medication Sig Start Date End  Date Taking? Authorizing Provider  furosemide  (LASIX ) 20 MG tablet Take 1 tablet (20 mg total) by mouth daily. 11/08/24  Yes Madireddy, Alean SAUNDERS, MD  glipiZIDE  (GLUCOTROL ) 5 MG tablet Take 1 tablet (5 mg total) by mouth 2 (two) times daily before a meal. 08/30/24  Yes Craft, [place], PA  losartan  (COZAAR ) 50 MG tablet TAKE 1 TABLET BY MOUTH EVERY DAY 12/04/24  Yes Craft, [place], PA  metoprolol  tartrate (LOPRESSOR ) 25 MG tablet TAKE 1 TABLET(25 MG) BY MOUTH TWICE DAILY 11/07/24  Yes Nicholaus Credit, PA-C  potassium chloride  SA (KLOR-CON  M) 20 MEQ tablet TAKE 1 TABLET(20 MEQ) BY MOUTH TWICE DAILY 12/06/24  Yes Craft, Nola, PA  rosuvastatin  (CRESTOR ) 40 MG tablet TAKE 1 TABLET(40 MG) BY MOUTH DAILY 11/06/24  Yes Nicholaus Credit, PA-C  acetaminophen  (TYLENOL ) 500 MG tablet Take 500 mg by mouth every 6 (six) hours as needed for mild pain or fever. Patient not taking: Reported on 12/18/2024    [provider]  albuterol  (PROVENTIL ) (2.5 MG/3ML) 0.083% nebulizer solution Take 3 mLs (2.5 mg total) by nebulization every 6 (six) hours as needed for wheezing. Patient not taking: Reported on 12/18/2024 11/04/21   Gold, Wayne E, PA-C  albuterol  (VENTOLIN  HFA) 108 [phone] Base) MCG/ACT inhaler Inhale into the lungs every 6 (six) hours as needed for wheezing or shortness of breath. Patient not taking: Reported on 12/18/2024    [provider]  ALPRAZolam  (XANAX ) 0.5 MG  tablet TAKE 1 TABLET BY MOUTH EVERY DAY AS NEEDED 10/27/24   Sherre Clapper, MD  ANORO ELLIPTA  62.5-25 MCG/ACT AEPB INHALE 1 PUFF INTO THE LUNGS DAILY 11/11/23   Nicholaus Credit, PA-C  Blood Glucose Monitoring Suppl DEVI 1 each by Does not apply route in the morning, at noon, and at bedtime. May substitute to any manufacturer covered by patient's insurance. 12/09/23   Nicholaus Credit, PA-C  insulin  glargine (LANTUS  SOLOSTAR) 100 UNIT/ML Solostar Pen Inject 15 Units into the skin in the morning. 12/13/24   Milon Cleaves, PA  Insulin  Pen Needle (BD PEN NEEDLE NANO 2ND  GEN) 32G X 4 MM MISC USE ONCE DAILY 11/11/22   Abran Jerilynn Loving, MD  Microlet Lancets MISC 1 each by Does not apply route daily in the afternoon. 12/07/22   Nicholaus Credit, PA-C  tirzepatide  (MOUNJARO ) 2.5 MG/0.5ML Pen Inject 2.5 mg into the skin once a week. 12/04/24   Milon Cleaves, PA  triamcinolone  cream (KENALOG ) 0.1 % Apply 1 Application topically 2 (two) times daily. Patient taking differently: Apply 1 Application topically 2 (two) times daily. As needed 12/09/23   Nicholaus Credit, PA-C  zolpidem  (AMBIEN ) 10 MG tablet TAKE 1 TABLET(10 MG) BY MOUTH AT BEDTIME AS NEEDED FOR SLEEP Patient not taking: Reported on 12/18/2024 10/27/24   Sherre Clapper, MD    Current Outpatient Medications  Medication Sig Dispense Refill   furosemide  (LASIX ) 20 MG tablet Take 1 tablet (20 mg total) by mouth daily. 90 tablet 1   glipiZIDE  (GLUCOTROL ) 5 MG tablet Take 1 tablet (5 mg total) by mouth 2 (two) times daily before a meal. 180 tablet 3   losartan  (COZAAR ) 50 MG tablet TAKE 1 TABLET BY MOUTH EVERY DAY 30 tablet 2   metoprolol  tartrate (LOPRESSOR ) 25 MG tablet TAKE 1 TABLET(25 MG) BY MOUTH TWICE DAILY 180 tablet 0   potassium chloride  SA (KLOR-CON  M) 20 MEQ tablet TAKE 1 TABLET(20 MEQ) BY MOUTH TWICE DAILY 180 tablet 1   rosuvastatin  (CRESTOR ) 40 MG tablet TAKE 1 TABLET(40 MG) BY MOUTH DAILY 90 tablet 0   acetaminophen  (TYLENOL ) 500 MG tablet Take 500 mg by mouth every 6 (six) hours as needed for mild pain or fever. (Patient not taking: Reported on 12/18/2024)     albuterol  (PROVENTIL ) (2.5 MG/3ML) 0.083% nebulizer solution Take 3 mLs (2.5 mg total) by nebulization every 6 (six) hours as needed for wheezing. (Patient not taking: Reported on 12/18/2024)     albuterol  (VENTOLIN  HFA) 108 (90 Base) MCG/ACT inhaler Inhale into the lungs every 6 (six) hours as needed for wheezing or shortness of breath. (Patient not taking: Reported on 12/18/2024)     ALPRAZolam  (XANAX ) 0.5 MG tablet TAKE 1 TABLET BY MOUTH EVERY DAY AS NEEDED  30 tablet 1   ANORO ELLIPTA  62.5-25 MCG/ACT AEPB INHALE 1 PUFF INTO THE LUNGS DAILY 60 each 0   Blood Glucose Monitoring Suppl DEVI 1 each by Does not apply route in the morning, at noon, and at bedtime. May substitute to any manufacturer covered by patient's insurance. 1 each 0   insulin  glargine (LANTUS  SOLOSTAR) 100 UNIT/ML Solostar Pen Inject 15 Units into the skin in the morning. 15 mL PRN   Insulin  Pen Needle (BD PEN NEEDLE NANO 2ND GEN) 32G X 4 MM MISC USE ONCE DAILY 100 each 3   Microlet Lancets MISC 1 each by Does not apply route daily in the afternoon. 100 each 2   tirzepatide  (MOUNJARO ) 2.5 MG/0.5ML Pen Inject 2.5 mg into the skin  once a week. 6 mL 2   triamcinolone  cream (KENALOG ) 0.1 % Apply 1 Application topically 2 (two) times daily. (Patient taking differently: Apply 1 Application topically 2 (two) times daily. As needed) 454 g 0   zolpidem  (AMBIEN ) 10 MG tablet TAKE 1 TABLET(10 MG) BY MOUTH AT BEDTIME AS NEEDED FOR SLEEP (Patient not taking: Reported on 12/18/2024) 30 tablet 1   Current Facility-Administered Medications  Medication Dose Route Frequency Provider Last Rate Last Admin   0.9 %  sodium chloride  infusion  500 mL Intravenous Continuous Roshell Brigham V, DO       dextrose  5 % solution   Intravenous Continuous Lizzete Gough V, DO        Allergies as of 12/18/2024 - Review Complete 12/18/2024  Allergen Reaction Noted   Amiodarone  Other (See Comments) 06/29/2023   Glucophage  [metformin ] Other (See Comments) 12/13/2023   Pioglitazone  Other (See Comments) 03/24/2024    Family History  Problem Relation Age of Onset   Other Mother        pre-diabetes   Other Father        MVA   Liver disease Sister    Hypertension Sister    Alzheimer's disease Maternal Grandmother    Hypertension Maternal Grandmother    Lung cancer Maternal Grandfather    Esophageal cancer Neg Hx    Colon cancer Neg Hx     Social History   Socioeconomic History   Marital status: Widowed     Spouse name: Not on file   Number of children: 3   Years of education: Not on file   Highest education level: Not on file  Occupational History   Occupation: disabled-private type  Tobacco Use   Smoking status: Former    Passive exposure: Past   Smokeless tobacco: Former    Types: Snuff   Tobacco comments:    Former snuff 08/11/2021  Vaping Use   Vaping status: Never Used  Substance and Sexual Activity   Alcohol use: Yes    Comment: rarely   Drug use: Never   Sexual activity: Never  Other Topics Concern   Not on file  Social History Narrative   Not on file   Social Drivers of Health   Tobacco Use: Medium Risk (12/18/2024)   Patient History    Smoking Tobacco Use: Former    Smokeless Tobacco Use: Former    Passive Exposure: Past  Physicist, Medical Strain: Low Risk (08/09/2023)   Overall Financial Resource Strain (CARDIA)    Difficulty of Paying Living Expenses: Not hard at all  Food Insecurity: No Food Insecurity (08/09/2023)   Hunger Vital Sign    Worried About Running Out of Food in the Last Year: Never true    Ran Out of Food in the Last Year: Never true  Transportation Needs: No Transportation Needs (08/09/2023)   PRAPARE - Administrator, Civil Service (Medical): No    Lack of Transportation (Non-Medical): No  Physical Activity: Insufficiently Active (08/09/2023)   Exercise Vital Sign    Days of Exercise per Week: 4 days    Minutes of Exercise per Session: 30 min  Stress: No Stress Concern Present (08/09/2023)   [employer] of Occupational Health - Occupational Stress Questionnaire    Feeling of Stress : Not at all  Social Connections: Socially Isolated (08/09/2023)   Social Connection and Isolation Panel    Frequency of Communication with Friends and Family: More than three times a week    Frequency of Social  Gatherings with Friends and Family: Three times a week    Attends Religious Services: Never    Active Member of Clubs or Organizations: No     Attends Banker Meetings: Never    Marital Status: Widowed  Intimate Partner Violence: Not At Risk (08/09/2023)   Humiliation, Afraid, Rape, and Kick questionnaire    Fear of Current or Ex-Partner: No    Emotionally Abused: No    Physically Abused: No    Sexually Abused: No  Depression (PHQ2-9): Low Risk (12/04/2024)   Depression (PHQ2-9)    PHQ-2 Score: 0  Alcohol Screen: Low Risk (08/09/2023)   Alcohol Screen    Last Alcohol Screening Score (AUDIT): 0  Housing: Low Risk (08/09/2023)   Housing    Last Housing Risk Score: 0  Utilities: Not At Risk (08/09/2023)   AHC Utilities    Threatened with loss of utilities: No  Health Literacy: Adequate Health Literacy (08/09/2023)   B1300 Health Literacy    Frequency of need for help with medical instructions: Never    Physical Exam: Vital signs in last 24 hours: @BP  112/60   Pulse 79   Temp 97.7 F (36.5 C) (Temporal)   Ht 5' 4 (1.626 m)   Wt 208 lb (94.3 kg)   SpO2 98%   BMI 35.70 kg/m  GEN: NAD EYE: Sclerae anicteric ENT: MMM CV: Non-tachycardic Pulm: CTA b/l GI: Soft, NT/ND NEURO:  Alert & Oriented x 3   Sandor Flatter, DO [facility]   12/18/2024 1:35 PM  "

## 2024-12-18 NOTE — Progress Notes (Signed)
 Pt's states no medical or surgical changes since previsit or office visit.

## 2024-12-19 ENCOUNTER — Other Ambulatory Visit: Payer: Self-pay

## 2024-12-19 ENCOUNTER — Telehealth: Payer: Self-pay | Admitting: *Deleted

## 2024-12-19 DIAGNOSIS — E1165 Type 2 diabetes mellitus with hyperglycemia: Secondary | ICD-10-CM

## 2024-12-19 MED ORDER — INSULIN PEN NEEDLE 30G X 8 MM MISC: 10.0000 | Freq: Every day | Status: AC

## 2024-12-19 NOTE — Telephone Encounter (Signed)
 Post procedure follow up call placed, no answer and left VM.

## 2024-12-20 ENCOUNTER — Other Ambulatory Visit: Payer: Self-pay | Admitting: Physician Assistant

## 2024-12-21 LAB — SURGICAL PATHOLOGY

## 2024-12-26 ENCOUNTER — Ambulatory Visit: Payer: Self-pay | Admitting: Gastroenterology

## 2024-12-27 ENCOUNTER — Other Ambulatory Visit: Payer: Self-pay | Admitting: Physician Assistant

## 2024-12-27 ENCOUNTER — Other Ambulatory Visit: Payer: Self-pay | Admitting: Family Medicine

## 2024-12-27 DIAGNOSIS — M545 Low back pain, unspecified: Secondary | ICD-10-CM

## 2024-12-27 DIAGNOSIS — F5102 Adjustment insomnia: Secondary | ICD-10-CM

## 2024-12-27 DIAGNOSIS — F419 Anxiety disorder, unspecified: Secondary | ICD-10-CM

## 2024-12-29 ENCOUNTER — Telehealth: Payer: Self-pay

## 2024-12-29 ENCOUNTER — Other Ambulatory Visit (HOSPITAL_COMMUNITY): Payer: Self-pay

## 2024-12-29 NOTE — Telephone Encounter (Signed)
 Pharmacy Patient Advocate Encounter   Received notification from [facility] KEY that prior authorization for Zolpidem  is required/requested.   Insurance verification completed.   The patient is insured through [facility] MEDICAID.   Per test claim: PA required; PA submitted to above mentioned insurance via Latent Key/confirmation #/EOC [id number] Status is pending

## 2024-12-29 NOTE — Telephone Encounter (Signed)
 Pharmacy Patient Advocate Encounter  Received notification from [facility] MEDICAID that Prior Authorization for Zolpidem  has been APPROVED from 12/29/2024 to 12/28/2024. Ran test claim, Copay is $4. This test claim was processed through [facility]- copay amounts may vary at other pharmacies due to pharmacy/plan contracts, or as the patient moves through the different stages of their insurance plan.   PA #/Case ID/Reference #: [id number]

## 2025-01-03 ENCOUNTER — Telehealth: Payer: Self-pay

## 2025-01-03 NOTE — Telephone Encounter (Signed)
 Hello, Can you look if he needs a Prior authorization ?  Copied from CRM [id number]. Topic: Clinical - Prescription Issue >> Jan 03, 2025  3:19 PM Emylou G wrote: Reason for CRM: Patient called.. said he zolpidem  (AMBIEN ) 10 MG tablet should be 30 day supply at $4 - they are trying to charge $4 for 15.  Can we review this?  And the tramadol  they are trying to charge $12 for 20 but he said should be he said it should be 30 days for $4. He is stating all his pills should be $4. Says he may need updated prior auth per the pharmacy

## 2025-01-04 ENCOUNTER — Other Ambulatory Visit: Payer: Self-pay

## 2025-01-04 ENCOUNTER — Other Ambulatory Visit (HOSPITAL_BASED_OUTPATIENT_CLINIC_OR_DEPARTMENT_OTHER): Payer: Self-pay

## 2025-01-05 ENCOUNTER — Other Ambulatory Visit (HOSPITAL_COMMUNITY): Payer: Self-pay

## 2025-01-05 ENCOUNTER — Other Ambulatory Visit: Payer: Self-pay

## 2025-01-05 DIAGNOSIS — M545 Low back pain, unspecified: Secondary | ICD-10-CM

## 2025-01-05 MED ORDER — TRAMADOL HCL 50 MG PO TABS: 50.0000 mg | ORAL_TABLET | Freq: Three times a day (TID) | ORAL | 1 refills | Status: AC | PRN

## 2025-01-05 MED ORDER — TRAMADOL HCL 50 MG PO TABS: 50.0000 mg | ORAL_TABLET | Freq: Three times a day (TID) | ORAL | Status: DC | PRN

## 2025-01-05 NOTE — Telephone Encounter (Signed)
 Dr Sherre sent it in January, 28th, but he did not get it that day because insurance questions.

## 2025-04-03 ENCOUNTER — Ambulatory Visit: Admitting: Physician Assistant
